# Patient Record
Sex: Female | Born: 1965 | Race: White | Hispanic: No | Smoking: Never smoker
Health system: Southern US, Community
[De-identification: ages and names within clinical notes are randomized; demographics above are authoritative.]

## PROBLEM LIST (undated history)

## (undated) DIAGNOSIS — F419 Anxiety disorder, unspecified: Secondary | ICD-10-CM

## (undated) DIAGNOSIS — Q519 Congenital malformation of uterus and cervix, unspecified: Secondary | ICD-10-CM

## (undated) DIAGNOSIS — R519 Headache, unspecified: Secondary | ICD-10-CM

## (undated) DIAGNOSIS — F32A Depression, unspecified: Secondary | ICD-10-CM

## (undated) DIAGNOSIS — M1990 Unspecified osteoarthritis, unspecified site: Secondary | ICD-10-CM

## (undated) DIAGNOSIS — G8929 Other chronic pain: Secondary | ICD-10-CM

## (undated) DIAGNOSIS — T8859XA Other complications of anesthesia, initial encounter: Secondary | ICD-10-CM

## (undated) DIAGNOSIS — N200 Calculus of kidney: Principal | ICD-10-CM

## (undated) DIAGNOSIS — M47816 Spondylosis without myelopathy or radiculopathy, lumbar region: Secondary | ICD-10-CM

## (undated) DIAGNOSIS — Z87898 Personal history of other specified conditions: Secondary | ICD-10-CM

## (undated) DIAGNOSIS — F4310 Post-traumatic stress disorder, unspecified: Secondary | ICD-10-CM

## (undated) DIAGNOSIS — T7840XA Allergy, unspecified, initial encounter: Secondary | ICD-10-CM

## (undated) DIAGNOSIS — M5481 Occipital neuralgia: Secondary | ICD-10-CM

## (undated) DIAGNOSIS — Z973 Presence of spectacles and contact lenses: Secondary | ICD-10-CM

## (undated) DIAGNOSIS — F329 Major depressive disorder, single episode, unspecified: Secondary | ICD-10-CM

## (undated) DIAGNOSIS — H9190 Unspecified hearing loss, unspecified ear: Secondary | ICD-10-CM

## (undated) DIAGNOSIS — K219 Gastro-esophageal reflux disease without esophagitis: Secondary | ICD-10-CM

## (undated) DIAGNOSIS — G4730 Sleep apnea, unspecified: Secondary | ICD-10-CM

## (undated) DIAGNOSIS — Z803 Family history of malignant neoplasm of breast: Secondary | ICD-10-CM

## (undated) DIAGNOSIS — F319 Bipolar disorder, unspecified: Secondary | ICD-10-CM

## (undated) DIAGNOSIS — M5136 Other intervertebral disc degeneration, lumbar region: Secondary | ICD-10-CM

## (undated) DIAGNOSIS — I10 Essential (primary) hypertension: Secondary | ICD-10-CM

## (undated) DIAGNOSIS — S82009A Unspecified fracture of unspecified patella, initial encounter for closed fracture: Secondary | ICD-10-CM

## (undated) DIAGNOSIS — R51 Headache: Secondary | ICD-10-CM

## (undated) DIAGNOSIS — M549 Dorsalgia, unspecified: Secondary | ICD-10-CM

## (undated) DIAGNOSIS — Z87442 Personal history of urinary calculi: Secondary | ICD-10-CM

## (undated) DIAGNOSIS — T4145XA Adverse effect of unspecified anesthetic, initial encounter: Secondary | ICD-10-CM

## (undated) DIAGNOSIS — M51369 Other intervertebral disc degeneration, lumbar region without mention of lumbar back pain or lower extremity pain: Secondary | ICD-10-CM

## (undated) HISTORY — PX: DILATION AND CURETTAGE OF UTERUS: SHX78

## (undated) HISTORY — PX: KNEE SURGERY: SHX244

## (undated) HISTORY — PX: LITHOTRIPSY: SUR834

## (undated) HISTORY — DX: Allergy, unspecified, initial encounter: T78.40XA

## (undated) HISTORY — PX: OVARY SURGERY: SHX727

## (undated) HISTORY — DX: Family history of malignant neoplasm of breast: Z80.3

## (undated) HISTORY — DX: Congenital malformation of uterus and cervix, unspecified: Q51.9

## (undated) HISTORY — PX: COLONOSCOPY W/ POLYPECTOMY: SHX1380

## (undated) HISTORY — PX: URETEROSCOPY WITH HOLMIUM LASER LITHOTRIPSY: SHX6645

## (undated) HISTORY — PX: CARPAL TUNNEL RELEASE: SHX101

## (undated) HISTORY — PX: NASAL SEPTUM SURGERY: SHX37

## (undated) HISTORY — DX: Calculus of kidney: N20.0

## (undated) HISTORY — PX: MIDDLE EAR SURGERY: SHX713

## (undated) HISTORY — PX: UPPER GI ENDOSCOPY: SHX6162

---
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
## 1997-09-22 ENCOUNTER — Encounter (HOSPITAL_COMMUNITY): Admission: RE | Admit: 1997-09-22 | Discharge: 1997-12-21 | Payer: Self-pay

## 2003-06-13 HISTORY — PX: GASTRIC BYPASS: SHX52

## 2004-05-29 ENCOUNTER — Ambulatory Visit: Payer: Self-pay

## 2004-06-12 HISTORY — PX: ABDOMINOPLASTY: SUR9

## 2006-06-12 HISTORY — PX: CHOLECYSTECTOMY: SHX55

## 2006-11-17 ENCOUNTER — Ambulatory Visit: Payer: Self-pay | Admitting: Psychiatry

## 2007-10-24 ENCOUNTER — Ambulatory Visit: Payer: Self-pay | Admitting: Unknown Physician Specialty

## 2007-11-07 ENCOUNTER — Ambulatory Visit: Payer: Self-pay | Admitting: Unknown Physician Specialty

## 2010-03-17 ENCOUNTER — Ambulatory Visit: Payer: Self-pay | Admitting: Unknown Physician Specialty

## 2010-04-27 ENCOUNTER — Ambulatory Visit: Payer: Self-pay | Admitting: Family Medicine

## 2011-03-10 ENCOUNTER — Ambulatory Visit: Payer: Self-pay | Admitting: Internal Medicine

## 2011-06-18 ENCOUNTER — Ambulatory Visit: Payer: Self-pay | Admitting: Internal Medicine

## 2012-08-04 ENCOUNTER — Emergency Department: Payer: Self-pay | Admitting: Emergency Medicine

## 2013-01-06 ENCOUNTER — Ambulatory Visit: Payer: Self-pay | Admitting: Emergency Medicine

## 2013-03-11 ENCOUNTER — Ambulatory Visit: Payer: Self-pay

## 2013-11-25 ENCOUNTER — Emergency Department: Payer: Self-pay | Admitting: Emergency Medicine

## 2013-11-25 LAB — CBC
HCT: 40.3 % (ref 35.0–47.0)
HGB: 13.3 g/dL (ref 12.0–16.0)
MCH: 29.7 pg (ref 26.0–34.0)
MCHC: 33.1 g/dL (ref 32.0–36.0)
MCV: 90 fL (ref 80–100)
Platelet: 242 10*3/uL (ref 150–440)
RBC: 4.49 10*6/uL (ref 3.80–5.20)
RDW: 13.9 % (ref 11.5–14.5)
WBC: 8.8 10*3/uL (ref 3.6–11.0)

## 2013-11-25 LAB — DRUG SCREEN, URINE
Amphetamines, Ur Screen: NEGATIVE (ref ?–1000)
BENZODIAZEPINE, UR SCRN: NEGATIVE (ref ?–200)
Barbiturates, Ur Screen: NEGATIVE (ref ?–200)
CANNABINOID 50 NG, UR ~~LOC~~: NEGATIVE (ref ?–50)
Cocaine Metabolite,Ur ~~LOC~~: NEGATIVE (ref ?–300)
MDMA (ECSTASY) UR SCREEN: NEGATIVE (ref ?–500)
Methadone, Ur Screen: NEGATIVE (ref ?–300)
Opiate, Ur Screen: POSITIVE (ref ?–300)
PHENCYCLIDINE (PCP) UR S: NEGATIVE (ref ?–25)
TRICYCLIC, UR SCREEN: NEGATIVE (ref ?–1000)

## 2013-11-25 LAB — ETHANOL
Ethanol %: <0.003 % (ref 0.000–0.080)
Ethanol: <3 mg/dL

## 2013-11-25 LAB — COMPREHENSIVE METABOLIC PANEL
ALBUMIN: 3.6 g/dL (ref 3.4–5.0)
ALK PHOS: 60 U/L
Anion Gap: 9 (ref 7–16)
BILIRUBIN TOTAL: 0.6 mg/dL (ref 0.2–1.0)
BUN: 14 mg/dL (ref 7–18)
CHLORIDE: 102 mmol/L (ref 98–107)
Calcium, Total: 8.5 mg/dL (ref 8.5–10.1)
Co2: 28 mmol/L (ref 21–32)
Creatinine: 0.75 mg/dL (ref 0.60–1.30)
EGFR (African American): >60
EGFR (Non-African Amer.): >60
Glucose: 128 mg/dL — ABNORMAL HIGH (ref 65–99)
Osmolality: 280 (ref 275–301)
POTASSIUM: 3.8 mmol/L (ref 3.5–5.1)
SGOT(AST): 27 U/L (ref 15–37)
SGPT (ALT): 20 U/L (ref 12–78)
SODIUM: 139 mmol/L (ref 136–145)
TOTAL PROTEIN: 6.9 g/dL (ref 6.4–8.2)

## 2013-11-25 LAB — URINALYSIS, COMPLETE
BACTERIA: NONE SEEN
BILIRUBIN, UR: NEGATIVE
Glucose,UR: NEGATIVE mg/dL (ref 0–75)
Ketone: NEGATIVE
LEUKOCYTE ESTERASE: NEGATIVE
NITRITE: NEGATIVE
Ph: 5 (ref 4.5–8.0)
RBC,UR: 496 /HPF (ref 0–5)
SPECIFIC GRAVITY: 1.026 (ref 1.003–1.030)
Squamous Epithelial: 1
WBC UR: 5 /HPF (ref 0–5)

## 2013-11-25 LAB — TSH: Thyroid Stimulating Horm: 0.27 u[IU]/mL — ABNORMAL LOW

## 2013-11-25 LAB — ACETAMINOPHEN LEVEL: Acetaminophen: <2 ug/mL

## 2013-11-25 LAB — CK: CK, Total: 318 U/L — ABNORMAL HIGH

## 2013-11-25 LAB — SALICYLATE LEVEL: Salicylates, Serum: <1.7 mg/dL

## 2014-01-07 ENCOUNTER — Ambulatory Visit: Payer: Self-pay | Admitting: Family Medicine

## 2014-03-20 ENCOUNTER — Emergency Department: Payer: Self-pay | Admitting: Emergency Medicine

## 2014-03-20 LAB — URINALYSIS, COMPLETE
BLOOD: NEGATIVE
Bacteria: NONE SEEN
Bilirubin,UR: NEGATIVE
GLUCOSE, UR: NEGATIVE mg/dL (ref 0–75)
Ketone: NEGATIVE
Leukocyte Esterase: NEGATIVE
Nitrite: NEGATIVE
Ph: 7 (ref 4.5–8.0)
Protein: NEGATIVE
RBC,UR: 2 /HPF (ref 0–5)
SPECIFIC GRAVITY: 1.008 (ref 1.003–1.030)
Squamous Epithelial: <1

## 2014-03-20 LAB — CBC
HCT: 38 % (ref 35.0–47.0)
HGB: 12.2 g/dL (ref 12.0–16.0)
MCH: 28.2 pg (ref 26.0–34.0)
MCHC: 32.1 g/dL (ref 32.0–36.0)
MCV: 88 fL (ref 80–100)
PLATELETS: 319 10*3/uL (ref 150–440)
RBC: 4.33 10*6/uL (ref 3.80–5.20)
RDW: 14.1 % (ref 11.5–14.5)
WBC: 3.9 10*3/uL (ref 3.6–11.0)

## 2014-03-20 LAB — COMPREHENSIVE METABOLIC PANEL
ANION GAP: 7 (ref 7–16)
Albumin: 3.3 g/dL — ABNORMAL LOW (ref 3.4–5.0)
Alkaline Phosphatase: 77 U/L
BILIRUBIN TOTAL: 0.5 mg/dL (ref 0.2–1.0)
BUN: 11 mg/dL (ref 7–18)
CHLORIDE: 106 mmol/L (ref 98–107)
CO2: 29 mmol/L (ref 21–32)
Calcium, Total: 8.2 mg/dL — ABNORMAL LOW (ref 8.5–10.1)
Creatinine: 0.84 mg/dL (ref 0.60–1.30)
Glucose: 96 mg/dL (ref 65–99)
OSMOLALITY: 282 (ref 275–301)
POTASSIUM: 4.1 mmol/L (ref 3.5–5.1)
SGOT(AST): 27 U/L (ref 15–37)
SGPT (ALT): 27 U/L
Sodium: 142 mmol/L (ref 136–145)
Total Protein: 6.8 g/dL (ref 6.4–8.2)

## 2014-04-09 ENCOUNTER — Encounter: Payer: Self-pay | Admitting: Family Medicine

## 2014-04-11 ENCOUNTER — Emergency Department: Payer: Self-pay | Admitting: Emergency Medicine

## 2014-04-11 LAB — CBC WITH DIFFERENTIAL/PLATELET
BASOS PCT: 0.5 %
Basophil #: 0 10*3/uL (ref 0.0–0.1)
EOS ABS: 0 10*3/uL (ref 0.0–0.7)
Eosinophil %: 0.5 %
HCT: 45.7 % (ref 35.0–47.0)
HGB: 15 g/dL (ref 12.0–16.0)
LYMPHS ABS: 1.1 10*3/uL (ref 1.0–3.6)
LYMPHS PCT: 12.1 %
MCH: 28.4 pg (ref 26.0–34.0)
MCHC: 32.9 g/dL (ref 32.0–36.0)
MCV: 86 fL (ref 80–100)
Monocyte #: 0.5 x10 3/mm (ref 0.2–0.9)
Monocyte %: 5.5 %
NEUTROS PCT: 81.4 %
Neutrophil #: 7.6 10*3/uL — ABNORMAL HIGH (ref 1.4–6.5)
Platelet: 346 10*3/uL (ref 150–440)
RBC: 5.29 10*6/uL — AB (ref 3.80–5.20)
RDW: 14.6 % — ABNORMAL HIGH (ref 11.5–14.5)
WBC: 9.4 10*3/uL (ref 3.6–11.0)

## 2014-04-11 LAB — URINALYSIS, COMPLETE
Bilirubin,UR: NEGATIVE
Glucose,UR: NEGATIVE mg/dL (ref 0–75)
KETONE: NEGATIVE
Leukocyte Esterase: NEGATIVE
NITRITE: NEGATIVE
Ph: 5 (ref 4.5–8.0)
Protein: 100
RBC,UR: 118 /HPF (ref 0–5)
SPECIFIC GRAVITY: 1.035 (ref 1.003–1.030)
WBC UR: 4 /HPF (ref 0–5)

## 2014-04-11 LAB — COMPREHENSIVE METABOLIC PANEL
ALT: 24 U/L
ANION GAP: 9 (ref 7–16)
AST: 30 U/L (ref 15–37)
Albumin: 3.9 g/dL (ref 3.4–5.0)
Alkaline Phosphatase: 69 U/L
BUN: 20 mg/dL — ABNORMAL HIGH (ref 7–18)
Bilirubin,Total: 0.8 mg/dL (ref 0.2–1.0)
CALCIUM: 8.2 mg/dL — AB (ref 8.5–10.1)
Chloride: 109 mmol/L — ABNORMAL HIGH (ref 98–107)
Co2: 24 mmol/L (ref 21–32)
Creatinine: 1.14 mg/dL (ref 0.60–1.30)
EGFR (African American): >60
EGFR (Non-African Amer.): 54 — ABNORMAL LOW
GLUCOSE: 106 mg/dL — AB (ref 65–99)
Osmolality: 286 (ref 275–301)
Potassium: 3.8 mmol/L (ref 3.5–5.1)
SODIUM: 142 mmol/L (ref 136–145)
Total Protein: 8 g/dL (ref 6.4–8.2)

## 2014-04-12 ENCOUNTER — Encounter: Payer: Self-pay | Admitting: Family Medicine

## 2014-04-22 ENCOUNTER — Ambulatory Visit: Payer: Self-pay

## 2014-05-04 ENCOUNTER — Inpatient Hospital Stay: Payer: Self-pay | Admitting: Psychiatry

## 2014-05-04 LAB — COMPREHENSIVE METABOLIC PANEL
ALT: 20 U/L
AST: 16 U/L (ref 15–37)
Albumin: 3.3 g/dL — ABNORMAL LOW (ref 3.4–5.0)
Alkaline Phosphatase: 63 U/L
Anion Gap: 8 (ref 7–16)
BUN: 14 mg/dL (ref 7–18)
Bilirubin,Total: 1.1 mg/dL — ABNORMAL HIGH (ref 0.2–1.0)
CO2: 26 mmol/L (ref 21–32)
CREATININE: 1.14 mg/dL (ref 0.60–1.30)
Calcium, Total: 8 mg/dL — ABNORMAL LOW (ref 8.5–10.1)
Chloride: 107 mmol/L (ref 98–107)
EGFR (African American): >60
EGFR (Non-African Amer.): 54 — ABNORMAL LOW
GLUCOSE: 112 mg/dL — AB (ref 65–99)
OSMOLALITY: 282 (ref 275–301)
Potassium: 3.4 mmol/L — ABNORMAL LOW (ref 3.5–5.1)
Sodium: 141 mmol/L (ref 136–145)
TOTAL PROTEIN: 7 g/dL (ref 6.4–8.2)

## 2014-05-04 LAB — CBC WITH DIFFERENTIAL/PLATELET
BASOS ABS: 0.1 10*3/uL (ref 0.0–0.1)
Basophil %: 0.6 %
Eosinophil #: 0.1 10*3/uL (ref 0.0–0.7)
Eosinophil %: 1.4 %
HCT: 39.5 % (ref 35.0–47.0)
HGB: 13.1 g/dL (ref 12.0–16.0)
Lymphocyte #: 1.6 10*3/uL (ref 1.0–3.6)
Lymphocyte %: 18.5 %
MCH: 28.6 pg (ref 26.0–34.0)
MCHC: 33.2 g/dL (ref 32.0–36.0)
MCV: 86 fL (ref 80–100)
MONO ABS: 0.6 x10 3/mm (ref 0.2–0.9)
Monocyte %: 6.8 %
Neutrophil #: 6.1 10*3/uL (ref 1.4–6.5)
Neutrophil %: 72.7 %
Platelet: 241 10*3/uL (ref 150–440)
RBC: 4.58 10*6/uL (ref 3.80–5.20)
RDW: 14.3 % (ref 11.5–14.5)
WBC: 8.4 10*3/uL (ref 3.6–11.0)

## 2014-05-04 LAB — DRUG SCREEN, URINE

## 2014-05-04 LAB — SALICYLATE LEVEL: Salicylates, Serum: <1.7 mg/dL

## 2014-05-04 LAB — ACETAMINOPHEN LEVEL

## 2014-05-04 LAB — TROPONIN I: Troponin-I: <0.02 ng/mL

## 2014-05-07 LAB — URINALYSIS, COMPLETE
Bilirubin,UR: NEGATIVE
Blood: NEGATIVE
Glucose,UR: NEGATIVE mg/dL (ref 0–75)
KETONE: NEGATIVE
LEUKOCYTE ESTERASE: NEGATIVE
Nitrite: NEGATIVE
PH: 5 (ref 4.5–8.0)
Protein: 100
Specific Gravity: 1.02 (ref 1.003–1.030)
WBC UR: 3 /HPF (ref 0–5)

## 2014-05-07 LAB — COMPREHENSIVE METABOLIC PANEL
ALT: 20 U/L
ANION GAP: 12 (ref 7–16)
AST: 25 U/L (ref 15–37)
Albumin: 3.7 g/dL (ref 3.4–5.0)
Alkaline Phosphatase: 70 U/L
BUN: 14 mg/dL (ref 7–18)
Bilirubin,Total: 0.9 mg/dL (ref 0.2–1.0)
CHLORIDE: 108 mmol/L — AB (ref 98–107)
CO2: 20 mmol/L — AB (ref 21–32)
CREATININE: 1.24 mg/dL (ref 0.60–1.30)
Calcium, Total: 8.6 mg/dL (ref 8.5–10.1)
EGFR (African American): 59 — ABNORMAL LOW
EGFR (Non-African Amer.): 49 — ABNORMAL LOW
Glucose: 155 mg/dL — ABNORMAL HIGH (ref 65–99)
Osmolality: 283 (ref 275–301)
Potassium: 3.5 mmol/L (ref 3.5–5.1)
Sodium: 140 mmol/L (ref 136–145)
Total Protein: 7.8 g/dL (ref 6.4–8.2)

## 2014-05-07 LAB — DRUG SCREEN, URINE
Amphetamines, Ur Screen: NEGATIVE (ref ?–1000)
BARBITURATES, UR SCREEN: NEGATIVE (ref ?–200)
BENZODIAZEPINE, UR SCRN: NEGATIVE (ref ?–200)
COCAINE METABOLITE, UR ~~LOC~~: NEGATIVE (ref ?–300)
Cannabinoid 50 Ng, Ur ~~LOC~~: NEGATIVE (ref ?–50)
MDMA (ECSTASY) UR SCREEN: NEGATIVE (ref ?–500)
Methadone, Ur Screen: NEGATIVE (ref ?–300)
Opiate, Ur Screen: NEGATIVE (ref ?–300)
Phencyclidine (PCP) Ur S: NEGATIVE (ref ?–25)
TRICYCLIC, UR SCREEN: NEGATIVE (ref ?–1000)

## 2014-05-07 LAB — SALICYLATE LEVEL

## 2014-05-07 LAB — CBC
HCT: 44.6 % (ref 35.0–47.0)
HGB: 14.5 g/dL (ref 12.0–16.0)
MCH: 28.3 pg (ref 26.0–34.0)
MCHC: 32.5 g/dL (ref 32.0–36.0)
MCV: 87 fL (ref 80–100)
Platelet: 286 10*3/uL (ref 150–440)
RBC: 5.12 10*6/uL (ref 3.80–5.20)
RDW: 14.6 % — AB (ref 11.5–14.5)
WBC: 9.8 10*3/uL (ref 3.6–11.0)

## 2014-05-07 LAB — ETHANOL: Ethanol: <3 mg/dL

## 2014-05-07 LAB — TSH: Thyroid Stimulating Horm: 1.03 u[IU]/mL

## 2014-05-07 LAB — ACETAMINOPHEN LEVEL: Acetaminophen: <2 ug/mL

## 2014-05-08 ENCOUNTER — Inpatient Hospital Stay: Payer: Self-pay | Admitting: Psychiatry

## 2014-05-19 ENCOUNTER — Emergency Department: Payer: Self-pay | Admitting: Internal Medicine

## 2014-05-19 LAB — CBC
HCT: 39 % (ref 35.0–47.0)
HGB: 12.7 g/dL (ref 12.0–16.0)
MCH: 28.4 pg (ref 26.0–34.0)
MCHC: 32.5 g/dL (ref 32.0–36.0)
MCV: 88 fL (ref 80–100)
Platelet: 242 10*3/uL (ref 150–440)
RBC: 4.46 10*6/uL (ref 3.80–5.20)
RDW: 14.9 % — ABNORMAL HIGH (ref 11.5–14.5)
WBC: 7.1 10*3/uL (ref 3.6–11.0)

## 2014-05-19 LAB — URINALYSIS, COMPLETE
Bilirubin,UR: NEGATIVE
GLUCOSE, UR: NEGATIVE mg/dL (ref 0–75)
Hyaline Cast: 2
Ketone: NEGATIVE
LEUKOCYTE ESTERASE: NEGATIVE
NITRITE: NEGATIVE
Ph: 5 (ref 4.5–8.0)
Protein: NEGATIVE
RBC,UR: 2 /HPF (ref 0–5)
Specific Gravity: 1.021 (ref 1.003–1.030)
Squamous Epithelial: 1
WBC UR: 2 /HPF (ref 0–5)

## 2014-05-19 LAB — DRUG SCREEN, URINE
AMPHETAMINES, UR SCREEN: NEGATIVE (ref ?–1000)
Barbiturates, Ur Screen: NEGATIVE (ref ?–200)
Benzodiazepine, Ur Scrn: NEGATIVE (ref ?–200)
Cannabinoid 50 Ng, Ur ~~LOC~~: NEGATIVE (ref ?–50)
Cocaine Metabolite,Ur ~~LOC~~: NEGATIVE (ref ?–300)
MDMA (ECSTASY) UR SCREEN: NEGATIVE (ref ?–500)
Methadone, Ur Screen: NEGATIVE (ref ?–300)
Opiate, Ur Screen: POSITIVE (ref ?–300)
Phencyclidine (PCP) Ur S: NEGATIVE (ref ?–25)
Tricyclic, Ur Screen: NEGATIVE (ref ?–1000)

## 2014-05-19 LAB — COMPREHENSIVE METABOLIC PANEL
ALBUMIN: 3.4 g/dL (ref 3.4–5.0)
AST: 47 U/L — AB (ref 15–37)
Alkaline Phosphatase: 79 U/L
Anion Gap: 8 (ref 7–16)
BUN: 14 mg/dL (ref 7–18)
Bilirubin,Total: 0.4 mg/dL (ref 0.2–1.0)
CO2: 30 mmol/L (ref 21–32)
Calcium, Total: 8.5 mg/dL (ref 8.5–10.1)
Chloride: 100 mmol/L (ref 98–107)
Creatinine: 1.04 mg/dL (ref 0.60–1.30)
EGFR (African American): >60
EGFR (Non-African Amer.): 60 — ABNORMAL LOW
Glucose: 103 mg/dL — ABNORMAL HIGH (ref 65–99)
Osmolality: 276 (ref 275–301)
POTASSIUM: 4.1 mmol/L (ref 3.5–5.1)
SGPT (ALT): 28 U/L
SODIUM: 138 mmol/L (ref 136–145)
TOTAL PROTEIN: 6.9 g/dL (ref 6.4–8.2)

## 2014-05-19 LAB — CK TOTAL AND CKMB (NOT AT ARMC)
CK, Total: 706 U/L — ABNORMAL HIGH (ref 26–192)
CK-MB: 8.6 ng/mL — ABNORMAL HIGH (ref 0.5–3.6)

## 2014-05-19 LAB — ETHANOL: Ethanol: <3 mg/dL

## 2014-05-19 LAB — TROPONIN I: Troponin-I: <0.02 ng/mL

## 2014-08-20 ENCOUNTER — Emergency Department: Payer: Self-pay | Admitting: Emergency Medicine

## 2014-08-30 ENCOUNTER — Emergency Department: Payer: Self-pay | Admitting: Internal Medicine

## 2014-09-10 ENCOUNTER — Ambulatory Visit
Admit: 2014-09-10 | Disposition: A | Discharge status: Home / Self Care | Payer: Self-pay | Attending: Unknown Physician Specialty | Admitting: Unknown Physician Specialty

## 2014-09-18 ENCOUNTER — Emergency Department
Admit: 2014-09-18 | Disposition: A | Discharge status: Home / Self Care | Payer: Self-pay | Admitting: Emergency Medicine

## 2014-10-03 NOTE — Consult Note (Signed)
PATIENT NAME:  Mariah Reeves, Oral A MR#:  [id number] DATE OF BIRTH:  04/04/1966  DATE OF CONSULTATION:  11/25/2013  CONSULTING PHYSICIAN:  Audery AmelJohn T. Eunie Lawn, MD  IDENTIFYING INFORMATION AND REASON FOR CONSULTATION: A 49 year old woman who presented to the Emergency Room with complaints of "shaking." Consultation out of concern that her psychiatric medicines may be playing a role.   HISTORY OF PRESENT ILLNESS: Information obtained from the patient and the chart and from the Emergency Room doctor. The patient states that she woke up last night feeling like she was shaking all over out of control. It persisted into the morning, and she came into the hospital. The patient theorizes that this could be due to Lexapro, which she says she has only been taking for a couple months. She says when she first started taking that medicine, it caused a similar feeling, but the feeling went away, so maybe it could be causing it now. The patient is unclear about some of the doses of her medicines and did not bring all of her bottles into the hospital. Her recent mood has continued to be anxious, but she has not had any acute or recent suicidal thoughts. She describes today what might be mild visual illusions, possibly related to sedation. Does not normally have hallucinations. Does not describe normal psychotic symptoms. The major stress in her life is having lost her job recently and having chronic pain problems. She is seeing a Dr. Lennice SitesHelton as a psychiatrist in [place] and sees Dr. Quillian QuinceBliss as her primary care doctor.   PAST PSYCHIATRIC HISTORY: Has been treated for anxiety and depression symptoms. In the past, was diagnosed as bipolar disorder. Current doctor says that he thinks that she has depression rather than bipolar disorder. She has not had any inpatient hospitalizations or suicide attempts. Had a crisis visit in [place] several months ago for only a few hours, but was not admitted.   SUBSTANCE ABUSE HISTORY: Denies any current  substance abuse.   SOCIAL HISTORY: The patient lives with her husband. 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 This was evidently a major stress for her, and she discusses it in relationship to her anxiety and depression symptoms.   CURRENT MEDICATIONS: Lamictal 200 mg x2 at bedtime, Lexapro 10 mg p.o. at bedtime, quetiapine 12.5 mg at bedtime p.r.n. sleep, gabapentin 600 mg p.o. at bedtime, Naprosyn 500 mg twice a day p.r.n., Phenergan 25 mg p.o. q.6 hours p.r.n., OxyContin 20 mg p.o. b.i.d., hydrocodone/acetaminophen 10 mg strength 1 q. hours p.r.n. for pain.   ALLERGIES: IV DYE.   REVIEW OF SYSTEMS: Still feels like she is shaking all over.   MENTAL STATUS EXAMINATION: Mood is a little bit down and anxious. Denied suicidal ideation. Not having acute hallucinations. Alert and oriented x4. Short- and long-term memory show a little bit of impairment right now. She overall looks tired, makes poor eye contact. Speech is slow and halting.   LABORATORY RESULTS: Labs done in the Emergency Room include drug screen positive for opiates. CK elevated at 318. TSH low at 0.27. Negative alcohol. Chemistry: Just a slightly elevated glucose of 128. CBC normal. Urinalysis: 3+ blood.   ASSESSMENT: This is a 49 year old woman who presents with somatic symptoms of unclear etiology. She speculates it could be related to her psychiatric medicine. She claims to me that she is not taking the quetiapine at all, although apparently she told Dr. Mayford KnifeWilliams that she had taken it last night. It seems  unclear whether there has been a recent increase in her Lexapro. I was given 2 different doses, one by her doctor and one by the pharmacy. Her current symptoms would be somewhat atypical, but could be related to her psychiatric medicines. She does not appear to be acutely dangerous or require psychiatric hospitalization. The patient, on physical exam, showed no stiffness at all or  rigidity. She is currently tachycardic and her blood pressure is elevated. Her temperature was only mildly elevated earlier, however. Her CK is up a little bit, which is, of course, nonspecific. What she is describing could be serotonin syndrome. For this reason, I suggest that she cut back on her dose of the Lexapro, but I suggest she not stop it completely out of concern that she may have withdrawal symptoms from it. Again, I encouraged her to follow up with her primary psychiatrist as soon as possible.   TREATMENT PLAN: I suggest to her that she not use any of the quetiapine, that she cut her Lexapro dose, whatever it is, in half, and that she call her psychiatrist at her earliest opportunity to discuss medication management. I also advised her to take a minimal amount of the p.r.n. hydrocodone as needed since apparently the plan is to transition her to OxyContin and to follow up with Dr. Quillian Quince for that. Case discussed with Dr. Mayford Knife, who is agreeable to the plan.   VITAL SIGNS: Pulse 134, respirations 20, blood pressure 150/67.   DIAGNOSIS, PRINCIPAL AND PRIMARY:  AXIS I: Depression, not otherwise specified.   SECONDARY DIAGNOSES:  AXIS I: No further.  AXIS II: Deferred.  AXIS III: Chronic pain and acute pain.  AXIS IV: Moderate to severe from job loss.  AXIS V: Functioning at time of evaluation 55.    ____________________________ Audery Amel, MD jtc:lb D: 11/25/2013 12:03:33 ET T: 11/25/2013 12:20:43 ET JOB#: [id number]  cc: Audery Amel, MD, <Dictator> Audery Amel MD ELECTRONICALLY SIGNED 11/26/2013 13:24

## 2014-10-03 NOTE — H&P (Signed)
PATIENT NAME:  Mariah Reeves, GEERDES MR#:  [id number] DATE OF BIRTH:  Sep 09, 1965  DATE OF ADMISSION:  05/04/2014  REFERRING PHYSICIAN: Emergency Room MD.   ATTENDING PHYSICIAN: Kenlea Woodell B. Jennet Maduro, MD.   IDENTIFYING DATA: Miss Mariah Reeves is a 49 year old female with history of bipolar disorder.   CHIEF COMPLAINT: "It was a mistake."   HISTORY OF PRESENT ILLNESS:  Miss Mariah Reeves has been diagnosed with bipolar disorder many years ago, for 12 years she had been a patient of Dr. Doroteo Glassman who prescribed Lamictal, Neurontin, and Lexapro. A while ago she decided to switch psychiatrists and now sees Dr. Lennice Sites in [place].  This was at the time when she was employed by [employer] and wanted to have a psychiatrist closer. She has been happy working with Dr. Lennice Sites as well as a therapist. She has been maintained on the same regimen of Lamictal and Neurontin and Lexapro. She has been under considerable stress for the past year or so. 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 Also she and her husband separated in October after 7 years of marriage, apparently lately the husband has been indicating that he started therapy and maybe they could get back together at some point. She overdosed on Lamictal on Sunday. She denies that it was a suicide attempt, instead she explains that her husband was supposed to call her on Sunday or maybe Saturday as Sunday was their wedding anniversary and he promised to take her out to dinner. Since he did not call she did not want to get out of bed and took extra Lamictal during the day twice keep herself sleepy. He did eventually call at 7:00 and reportedly they had dinner. After dinner she got sick, she felt that it was from Lamictal, called 911 and was brought to the hospital. She was really sleepy and  somewhat confused and it took her several hours to recover. She ended up being admitted to psychiatry. She adamantly denies any symptoms of depression, anxiety, psychosis, or symptoms suggestive of bipolar mania. She denies alcohol or illicit substance use. She does have prescription for Vicodin from her primary care provider with whom she has pain contract, she adamantly denies ever misusing her pain medication and is very careful not to ever run out since she needs it badly.   PAST PSYCHIATRIC HISTORY: She has a long history of bipolar illness. She has been treatment compliant. She never attempted suicide, has never been hospitalized.   PAST MEDICAL HISTORY:  Chronic pain.   ALLERGIES: INTRAVENOUS PYELOGRAM DYE.    MEDICATIONS ON ADMISSION: Lamictal 400 mg at night, Lexapro 20 mg daily, Neurontin 600 mg at night, Percocet for pain, Flomax occasionally for nasal congestion.   SOCIAL HISTORY: She is now separated from her husband. She is originally from [place] and still has [place] citizenship. 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 She explains that following a knee surgery it was recommended that as often as possible during the day she takes a break and ices her knee, she reportedly during her break during teaching hours had a mat on the floor and was icing her knee when the student took a picture of her proving that she was asleep, she was  fired immediately. There are some outstanding charges for DUI. She now lives alone at the trailer that she owns as she is separated from her husband. She is on short-term disability and receives an income from it. She gets some financial support from her husband.   FAMILY PSYCHIATRIC HISTORY: None reported.   REVIEW OF SYSTEMS:  CONSTITUTIONAL: No fevers.  No chills. No weight changes.  EYES: No double or blurred vision.  EARS, NOSE, AND THROAT: No hearing loss.  RESPIRATORY: No shortness of breath or  cough.  CARDIOVASCULAR: No chest pain or orthopnea.  GASTROINTESTINAL: No abdominal pain, nausea, vomiting, or diarrhea.  GENITOURINARY: No incontinence or frequency.  ENDOCRINE: No heat or cold intolerance.  LYMPHATIC: No anemia or easy bruising.  INTEGUMENTARY: No acne or rash.  MUSCULOSKELETAL: No muscle or joint pain. Positive for back pain.  NEUROLOGIC: No tingling or weakness.  PSYCHIATRIC: See history of present illness for details.   PHYSICAL EXAMINATION:  VITAL SIGNS: Blood pressure 124/83, pulse 80, respirations 20, temperature 98.6. GENERAL: This is slightly obese, middle-aged female in no acute distress.  HEENT: The pupils are equal, round, and reactive to light. Sclerae anicteric.  NECK: Supple. No thyromegaly.  LUNGS: Clear to auscultation. No dullness to percussion.  HEART: Regular rhythm and rate. No murmurs, rubs, or gallops.  ABDOMEN: Soft, nontender, nondistended. Positive bowel sounds.  MUSCULOSKELETAL: Normal muscle strength in all extremities.  SKIN: No rashes or bruises.  LYMPHATIC: No cervical adenopathy.  NEUROLOGIC: Cranial nerves II through XII are intact.   LABORATORY DATA: Chemistries are within normal limits except for blood glucose of 112, potassium 3.4. Blood alcohol level not tested. LFTs within normal limits except for total bilirubin of 1.1. Urine toxicology screen negative for substances. CBC within normal limits. Serum acetaminophen and salicylates are low. EKG, sinus rhythm with fusion complexes.     MENTAL STATUS EXAMINATION:  The patient is alert and oriented to person, place, time, and situation. She is pleasant, polite and cooperative. She is well groomed and casually dressed. She maintains good eye contact. Her speech is of normal rhythm, rate and volume. Mood is fine with full affect. Thought process is logical and goal oriented. Thought content, she denies suicidal or homicidal ideation, delusions, or paranoia. There are no auditory or visual  hallucinations. Her cognition is grossly intact. Registration, recall, short and long-term memory are intact. She is of above average intelligence and fund of knowledge. Her insight and judgment are fair.   SUICIDE RISK ASSESSMENT: This is a patient with a long history of bipolar illness stable on medication, who took extra 2 doses of Lamictal. She denies that it was a suicide attempt. She is able to contract for safety. She hopes to reconcile her differences with her ex. She is forward thinking and optimistic about the future.   DIAGNOSES:  AXIS I: Bipolar 2 disorder.   AXIS II: Deferred.   AXIS III:  1.  Chronic back pain.   2.  Kidney stones.  3.  Status post gastric bypass surgery.   PLAN: The patient was briefly admitted to [facility] behavioral medicine unit for safety, stabilization, and medication management.     1.  Suicidal ideation. She denies and is able to contract for safety.  2.  Mood. She will continue Lamictal, Neurontin, and Lexapro as per primary psychiatrist.   3.  Chronic pain.  She will continue with her pain doctor  4.  Social. She has a followup appointment with her nephrologist tomorrow,  she wants to keep this appointment, that is in [place].  She hopes that the doctor will give her permission to exercise, she joined a gym and wants to start on Monday. She gained 30 pounds lately and wants to get in shape. She plans on spending Thanksgiving with her mother.   DISPOSITION: She was discharged to home. She will follow up with Dr. Lennice SitesHelton and her therapist in the community.  DISCHARGE MEDICATIONS:  Lamictal 400 mg at bedtime, Neurontin 600 mg at bedtime, Lexapro 20 mg daily, Percocet as prescribed by her primary care provider. No prescriptions were given.    ____________________________ Ellin GoodieJolanta B. Jennet MaduroPucilowska, MD jbp:bu D: 05/05/2014 15:18:32 ET T: 05/05/2014 15:58:08 ET JOB#: [id number]  cc: Camarion Weier B. Jennet MaduroPucilowska, MD, <Dictator> Shari ProwsJOLANTA B  Less Woolsey MD ELECTRONICALLY SIGNED 05/14/2014 17:24

## 2014-10-03 NOTE — H&P (Signed)
PATIENT NAME:  Mariah Reeves, Baneza A MR#:  [id number] DATE OF BIRTH:  06/30/1965  DATE OF ADMISSION:  05/08/2014  DATE OF CONSULTATION: 05/08/2014.    CONSULTING PHYSICIAN: Audery AmelJohn T. Clapacs, M.D.   IDENTIFYING INFORMATION AND CHIEF COMPLAINT:  A 49 year old woman who came to the hospital after taking an overdose of prescription medicine.   CHIEF COMPLAINT: "I feel stupid."   HISTORY OF PRESENT ILLNESS: Information obtained from the patient and the chart. The patient says that after leaving the hospital 2 days ago, she continued to feel very depressed and hopeless.  Had another conversation with her husband, who made it clear to her that he wanted to get a divorce.  She reports that she took an overdose of her Lamictal, approximately 15 pills. She was going to take more, but she started vomiting.  At that point, she called 911 herself.  She reports that her mood continues to be depressed and feels hopeless and sad and down with low energy.  She denies having suicidal ideation currently right now, says that she thinks that she did want to die when she took the pills.  She has otherwise been compliant with previously prescribed medication and had not been abusing substances.   PAST HISTORY:  History of depression that has been going on at least ever since she and her husband separated, probably longer than that.  She sees Dr. Lennice SitesHelton in [place].  She is on a combination of Lexapro and Lamictal and Neurontin. Major stress includes separation from her husband as well as loss of her job. The patient has made suicide attempts in the past. Had a recent hospitalization here.   FAMILY HISTORY: She has no known family history of mental illness.   PAST MEDICAL HISTORY: Chronic pain problems, also chronic nausea. Recent kidney stones that were treated with lithotripsy.   SOCIAL HISTORY: Currently living alone. 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 Separated from her husband. All of that appears catastrophic for her.  She has limited social contact otherwise.   SUBSTANCE ABUSE HISTORY: Denies any history of alcohol or drug abuse.   CURRENT MEDICATIONS: Lamictal 400 mg at night, Lexapro 20 mg a day, gabapentin 600 mg at night p.r.n., Prozac 0.4 mg, Flomax a day.   ALLERGIES: IVP DYE.   REVIEW OF SYSTEMS: Currently states that she feels depressed and tired. Denies acute suicidal intent. Denies hallucinations. No acute pain. Otherwise, denies other symptoms.   MENTAL STATUS EXAMINATION: Tired and sluggish-appearing woman, looks her stated age, cooperative but passive.  Eye contact decreased.  Psychomotor activity slow. Speech slow, but understandable. Affect blunted. Mood stated as being stupid. Thoughts are lucid without loosening of associations or delusions, but show thoughts slowing and some blocking.  Denies auditory or visual hallucinations.  Denies suicidal intent, still does have some hopelessness, no homicidal ideation.  She could recall 3/3 objects immediately and 2/3 at 3 minutes. She was alert and oriented x 4. Judgment and insight adequate at the moment.  Intelligence normal.   LABORATORY RESULTS: Urinalysis 3 white blood cells, trace bacteria. Drug screen is all negative. TSH normal. Salicylates normal. CBC normal. Alcohol negative. Chemistry slightly elevated glucose 155.   VITAL SIGNS: Current blood pressure is 143/94, respirations 16, pulse 86, temperature 98.3.   ASSESSMENT: A 49 year old woman with major depression, severe, who was just released from the hospital went home and once again tried to kill herself.  This time she is being very up front about it.  Multiple  symptoms of severe depression. Functioning very poorly at home, impulsive and suicidal even without substance abuse. Needs hospitalization for safety.   TREATMENT PLAN: Admit to psychiatry. Continue usual outpatient medicine. Seizure and fall precautions in place, as well as suicide precautions.   DIAGNOSIS, PRINCIPAL AND PRIMARY:   AXIS I: Major depression, severe, recurrent.   SECONDARY DIAGNOSES:  AXIS I: Deferred.  AXIS II: Deferred.  AXIS III: Kidney stones, chronic pain.     ____________________________ Audery Amel, MD jtc:DT D: 05/08/2014 12:49:04 ET T: 05/08/2014 13:18:44 ET JOB#: [id number]  cc: Audery Amel, MD, <Dictator> Audery Amel MD ELECTRONICALLY SIGNED 05/22/2014 19:09

## 2014-10-03 NOTE — Consult Note (Signed)
PATIENT NAME:  Mariah Reeves, Mariah Reeves MR#:  [id number] DATE OF BIRTH:  10/08/1965  DATE OF CONSULTATION:  05/04/2014  REFERRING PHYSICIAN:   CONSULTING PHYSICIAN:  Audery Amel, MD  IDENTIFYING INFORMATION AND REASON FOR CONSULTATION: A 49 year old woman who came into the Emergency Room voluntarily because of dizziness.   CHIEF COMPLAINT: "I'm not suicidal."   HISTORY OF PRESENT ILLNESS: Information obtained from the patient and the chart. The patient states that yesterday she took extra doses of Lamictal. In the morning she wanted to go back to sleep at 6:00 and so she took an extra 400 mg of Lamictal. Later on in the day, she wanted to take a nap so she took another 400 mg of Lamictal. She started feeling sick to her stomach, feeling dizzy, falling down, unable to walk steadily. She called 911. The patient reports her mood has been extremely depressed recently. She has very little enjoyment of activities in her life. She spends most of her time sitting around the house doing nothing. She sleeps excessively. She says she has had passive suicidal thoughts, but has no active suicidal intent or plan. Not reporting any hallucinations. She is currently seeing Dr. Lennice Sites in [facility] and also sees a Veterinary surgeon. She is taking Lamictal 400 mg at night and Lexapro 20 mg a day and gabapentin 600 mg at night. Similar medicines to what she was taking several months ago when I saw her in the Emergency Room. The patient stays nervous and anxious all the time, feels hopeless about her life.   PAST PSYCHIATRIC HISTORY: The patient has had psychiatric treatment for years. She had a past history of suicidal thinking, but has not made serious suicide attempts. No previous hospitalization. Cannot remember previous medicines that she has been treated with. She has been variously diagnosed as bipolar or regular depression.   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 She is also  separated from her husband. She and her husband separated a couple of months ago and this has been a huge stress for her, which she cannot except.   FAMILY HISTORY: No known family history.   PAST MEDICAL HISTORY: Chronic pain problems.   SUBSTANCE ABUSE HISTORY: Denies use of alcohol or illicit drugs. Denies that she abuses any of her prescription drugs.   CURRENT MEDICATIONS: Lamictal 400 mg at night, Lexapro 20 mg a day, gabapentin 600 mg at night, p.r.n. Percocet for pain, Flomax occasionally for nasal congestion.   ALLERGIES: INTRAVENOUS PYELOGRAM DYE.   REVIEW OF SYSTEMS: Depressed mood. Tearfulness. Low energy. Excessive sleepiness. Lack of interest. Hopelessness. Still feeling a little sick to her stomach, still feeling a little bit dizzy. Otherwise negative physical review of systems.   MENTAL STATUS EXAMINATION: Reasonably groomed woman, looks her stated age, cooperative with the interview. Eye contact good. Psychomotor activity very slow. Speech is quiet, slow and decreased in amount. Affect is dysphoric, not tearful. Mood stated as depressed. Thoughts are lucid but slow. No delusions evident. Denies auditory or visual hallucinations. Denies any current suicidal or homicidal ideation. She can recall 3/3 objects immediately and at 3 minutes. She is alert and oriented x 4. Judgment and insight at least partially intact. Normal intelligence.   LABORATORY RESULTS: Chemistry panel shows drug screen is all negative, slightly low albumin at 3.3. Elevated bilirubin 1.1, low calcium at 8. CBC is unremarkable.   VITAL SIGNS: Current blood pressure is 134/79, respirations 18, pulse 90, temperature 98.  ASSESSMENT: A 49 year old woman with severe major depression. Claims that what she did yesterday was not a suicide attempt, but certainly was in poor judgment. Very sleepy and depressed looking. Not functioning well at home. I think that her lack of support, lack of response to outpatient  treatment and this dangerous behavior warrants inpatient hospitalization.   TREATMENT PLAN: The patient agrees to voluntarily be admitted to the hospital. Suicide precautions as well as fall precautions in place. No change currently to acute medicines, which primary team on the unit can do. Psychoeducation.   DIAGNOSIS, PRINCIPAL AND PRIMARY:  AXIS I: Major depression, severe, recurrent.   SECONDARY DIAGNOSES:  AXIS I: Rule out bipolar disorder.  AXIS II: Deferred.  AXIS III: Chronic pain.   ____________________________ Audery AmelJohn T. Keontre Defino, MD jtc:TT D: 05/04/2014 15:00:36 ET T: 05/04/2014 15:16:22 ET JOB#: [id number]  cc: Audery AmelJohn T. Taniah Reinecke, MD, <Dictator> Audery AmelJOHN T Tadan Shill MD ELECTRONICALLY SIGNED 05/22/2014 19:08

## 2014-10-05 LAB — SURGICAL PATHOLOGY

## 2014-11-01 ENCOUNTER — Encounter: Payer: Self-pay | Admitting: Emergency Medicine

## 2014-11-01 ENCOUNTER — Emergency Department: Payer: BC Managed Care – PPO

## 2014-11-01 ENCOUNTER — Emergency Department
Admission: EM | Admit: 2014-11-01 | Discharge: 2014-11-01 | Disposition: A | Discharge status: Home / Self Care | Payer: BC Managed Care – PPO | Attending: Emergency Medicine | Admitting: Emergency Medicine

## 2014-11-01 DIAGNOSIS — S0990XA Unspecified injury of head, initial encounter: Secondary | ICD-10-CM | POA: Diagnosis present

## 2014-11-01 DIAGNOSIS — S0093XA Contusion of unspecified part of head, initial encounter: Secondary | ICD-10-CM | POA: Diagnosis not present

## 2014-11-01 DIAGNOSIS — S8001XA Contusion of right knee, initial encounter: Secondary | ICD-10-CM | POA: Insufficient documentation

## 2014-11-01 DIAGNOSIS — S161XXA Strain of muscle, fascia and tendon at neck level, initial encounter: Secondary | ICD-10-CM | POA: Insufficient documentation

## 2014-11-01 DIAGNOSIS — Y998 Other external cause status: Secondary | ICD-10-CM | POA: Insufficient documentation

## 2014-11-01 DIAGNOSIS — Y9289 Other specified places as the place of occurrence of the external cause: Secondary | ICD-10-CM | POA: Diagnosis not present

## 2014-11-01 DIAGNOSIS — W01198A Fall on same level from slipping, tripping and stumbling with subsequent striking against other object, initial encounter: Secondary | ICD-10-CM | POA: Diagnosis not present

## 2014-11-01 DIAGNOSIS — Y9389 Activity, other specified: Secondary | ICD-10-CM | POA: Diagnosis not present

## 2014-11-01 HISTORY — DX: Bipolar disorder, unspecified: F31.9

## 2014-11-01 HISTORY — DX: Major depressive disorder, single episode, unspecified: F32.9

## 2014-11-01 HISTORY — DX: Depression, unspecified: F32.A

## 2014-11-01 NOTE — ED Notes (Signed)
Pt presents from home via [facility]. EMS states pt fell in the kitchen and hit her head on the countertop, main complaint is R knee pain. Pain 7/10 in R knee, described as throbbing. EMS states pt took Gabapentin, Phengergan, Lamictal, pt presents lethargic.

## 2014-11-01 NOTE — ED Provider Notes (Signed)
[facility]  ____________________________________________  Time seen: 10:15  I have reviewed the triage vital signs and the nursing notes.   HISTORY  Chief Complaint Fall and Knee Pain   HPI Mariah Reeves is a 49 y.o. female presents to the emergency room today via EMS with complaint of falling in the kitchen and hitting her head on the countertop. She lives alone so it was an unwitnessed fall, she denies any loss of consciousness. She is the one who called EMS to pick her up. She complains of a headache that she is afraid he is going to turn into "migraine" on him and right knee pain. Her pain is 7 out of 10 at present. She is only taking her regular medication of gabapentin, Phenergan, Lamictal. EMS reports that she was ambulatory at the scene. She states that she has had arthroscopic surgery on her right knee in the past but is unable to give an exact date.   Past Medical History  Diagnosis Date  . Depression   . Bipolar 1 disorder     There are no active problems to display for this patient.   Past Surgical History  Procedure Laterality Date  . Gastric bypass    . Knee surgery      No current outpatient prescriptions on file.  Allergies Contrast media  History reviewed. No pertinent family history.  Social History History  Substance Use Topics  . Smoking status: Never Smoker   . Smokeless tobacco: Never Used  . Alcohol Use: No    Review of Systems Constitutional: No fever/chills Eyes: No visual changes. ENT: No sore throat. Cardiovascular: Denies chest pain. Respiratory: Denies shortness of breath. Gastrointestinal: No abdominal pain.  No nausea, no vomiting.  No diarrhea.  No constipation. Genitourinary: Negative for dysuria. Musculoskeletal: Negative for back pain. Positive for right knee pain. Skin: Negative for rash. Neurological: Negative for headaches, focal weakness or numbness.  10-point ROS  otherwise negative.  ____________________________________________   PHYSICAL EXAM:  VITAL SIGNS: ED Triage Vitals  Enc Vitals Group     BP 11/01/14 1015 143/94 mmHg     Pulse Rate 11/01/14 1015 94     Resp 11/01/14 1015 16     Temp 11/01/14 1015 98 F (36.7 C)     Temp Source 11/01/14 1015 Oral     SpO2 11/01/14 1015 94 %     Weight 11/01/14 1015 240 lb (108.863 kg)     Height 11/01/14 1015 5\' 2"  (1.575 m)     Head Cir --      Peak Flow --      Pain Score 11/01/14 1007 8     Pain Loc --      Pain Edu? --      Excl. in GC? --     Constitutional: Alert and oriented. Well appearing and in no acute distress. Eyes: Conjunctivae are normal. PERRL. EOMI. Head: Atraumatic. Nose: No congestion/rhinnorhea. Mouth/Throat: Mucous membranes are moist.  Neck: No stridor.  No cervical spine tenderness on palpation. Cardiovascular: Normal rate, regular rhythm. Grossly normal heart sounds.  Good peripheral circulation. Respiratory: Normal respiratory effort.  No retractions. Lungs CTAB. Gastrointestinal: Soft and nontender. No distention. No abdominal bruits. No CVA tenderness. Musculoskeletal: Right anterior knee pain. No gross deformity no effusion. Range of motion is restricted as secondary to patient's pain. Nontender pelvis nontender left knee, nontender bilateral ankles and feet. Patient is able to move upper extremities without any restrictions. Neurologic:  Normal  speech and language. No gross focal neurologic deficits are appreciated. Speech is normal. No gait instability. Skin:  Skin is warm, dry and intact. No rash or abrasions noted noted. Psychiatric: Mood and affect are normal. Speech is normal, patient is slow to answer questions. She is oriented 3.  ____________________________________________   LABS (all labs ordered are listed, but only abnormal results are displayed)  Labs Reviewed - No data to display RADIOLOGY  CT scan of the head per radiologist shows motion  artifact, no skull fracture or intracranial hemorrhage. CT scan of cervical spine no cervical fracture or subluxation. Reversal of normal cervical lordosis and degenerative changes were seen per radiologist. Right knee x-ray per radiologist shows no fracture there or degenerative changes. There was an incidental finding on the cervical CT. Addendum was made by the radiologist with a 1.4 cm nodule on the left lobe of thyroid gland and a 0.7 cm nodule on the right lobe. ____________________________________________   PROCEDURES  Procedure(s) performed: None  Critical Care performed: No  ____________________________________________   INITIAL IMPRESSION / ASSESSMENT AND PLAN / ED COURSE  Pertinent labs & imaging results that were available during my care of the patient were reviewed by me and considered in my medical decision making (see chart for details).  Patient was reevaluated after x-ray. On entering the room patient was asleep she was easily arousable she answered questions appropriately. Cranial nerves II through XII grossly intact. Patient was ambulatory. She is to follow-up with her regular doctor and continue on regular medication. She is instructed to use ice on her knee. ____________________________________________   FINAL CLINICAL IMPRESSION(S) / ED DIAGNOSES  Final diagnoses:  Head contusion, initial encounter  Cervical strain, acute, initial encounter  Contusion, knee, right, initial encounter      Tommi Rumps, PA-C 11/01/14 1322  Sharman Cheek, MD 11/01/14 1413

## 2014-11-01 NOTE — ED Notes (Signed)
NAD noted at time of D/C. Pt denies questions or concerns. Pt taken to the lobby via wheelchair at this time.  

## 2014-11-01 NOTE — Discharge Instructions (Signed)
Cervical Sprain A cervical sprain is when the tissues (ligaments) that hold the neck bones in place stretch or tear. HOME CARE   Put ice on the injured area.  Put ice in a plastic bag.  Place a towel between your skin and the bag.  Leave the ice on for 15-20 minutes, 3-4 times a day.  You may have been given a collar to wear. This collar keeps your neck from moving while you heal.  Do not take the collar off unless told by your doctor.  If you have long hair, keep it outside of the collar.  Ask your doctor before changing the position of your collar. You may need to change its position over time to make it more comfortable.  If you are allowed to take off the collar for cleaning or bathing, follow your doctor's instructions on how to do it safely.  Keep your collar clean by wiping it with mild soap and water. Dry it completely. If the collar has removable pads, remove them every 1-2 days to hand wash them with soap and water. Allow them to air dry. They should be dry before you wear them in the collar.  Do not drive while wearing the collar.  Only take medicine as told by your doctor.  Keep all doctor visits as told.  Keep all physical therapy visits as told.  Adjust your work station so that you have good posture while you work.  Avoid positions and activities that make your problems worse.  Warm up and stretch before being active. GET HELP IF:  Your pain is not controlled with medicine.  You cannot take less pain medicine over time as planned.  Your activity level does not improve as expected. GET HELP RIGHT AWAY IF:   You are bleeding.  Your stomach is upset.  You have an allergic reaction to your medicine.  You develop new problems that you cannot explain.  You lose feeling (become numb) or you cannot move any part of your body (paralysis).  You have tingling or weakness in any part of your body.  Your symptoms get worse. Symptoms include:  Pain,  soreness, stiffness, puffiness (swelling), or a burning feeling in your neck.  Pain when your neck is touched.  Shoulder or upper back pain.  Limited ability to move your neck.  Headache.  Dizziness.  Your hands or arms feel week, lose feeling, or tingle.  Muscle spasms.  Difficulty swallowing or chewing. MAKE SURE YOU:   Understand these instructions.  Will watch your condition.  Will get help right away if you are not doing well or get worse. Document Released: 11/15/2007 Document Revised: 01/29/2013 Document Reviewed: 12/04/2012 [facility] Patient Information 2015 [place], [place]. This information is not intended to replace advice given to you by your health care provider. Make sure you discuss any questions you have with your health care provider.    Ice to knee as needed for pain and swelling.  Continue your regular medication and follow up with your doctor this week if any continued problems

## 2014-12-11 ENCOUNTER — Encounter: Payer: Self-pay | Admitting: Emergency Medicine

## 2014-12-11 ENCOUNTER — Emergency Department
Admission: EM | Admit: 2014-12-11 | Discharge: 2014-12-11 | Disposition: A | Discharge status: Home / Self Care | Payer: BC Managed Care – PPO | Attending: Emergency Medicine | Admitting: Emergency Medicine

## 2014-12-11 DIAGNOSIS — G43909 Migraine, unspecified, not intractable, without status migrainosus: Secondary | ICD-10-CM | POA: Diagnosis not present

## 2014-12-11 DIAGNOSIS — Z79899 Other long term (current) drug therapy: Secondary | ICD-10-CM | POA: Insufficient documentation

## 2014-12-11 DIAGNOSIS — G43009 Migraine without aura, not intractable, without status migrainosus: Secondary | ICD-10-CM

## 2014-12-11 MED ORDER — METOCLOPRAMIDE HCL 5 MG/ML IJ SOLN
INTRAMUSCULAR | Status: AC
  Administered 2014-12-11: 10 mg via INTRAVENOUS
  Filled 2014-12-11: qty 2

## 2014-12-11 MED ORDER — METOCLOPRAMIDE HCL 5 MG/ML IJ SOLN
10.0000 mg | Freq: Once | INTRAMUSCULAR | Status: AC
  Administered 2014-12-11: 10 mg via INTRAVENOUS

## 2014-12-11 MED ORDER — SODIUM CHLORIDE 0.9 % IV SOLN
Freq: Once | INTRAVENOUS | Status: AC
  Administered 2014-12-11: 20:00:00 via INTRAVENOUS

## 2014-12-11 NOTE — ED Provider Notes (Signed)
[facility] Emergency Department Provider Note  ____________________________________________  Time seen: Approximately 7:54 PM  I have reviewed the triage vital signs and the nursing notes.   HISTORY  Chief Complaint Migraine   HPI Mariah Reeves is a 49 y.o. female with a history of migraines. Patient had a CT within the last few months which was normal. Patient reports this is her usual bad migraine. It started with a mild headache and progressed over the next several 3-4 days and to her usual bad migraine. Headache is unilateral and throbbing. Severe in nature. She also has some nausea.   Past Medical History  Diagnosis Date  . Depression   . Bipolar 1 disorder     There are no active problems to display for this patient.   Past Surgical History  Procedure Laterality Date  . Gastric bypass    . Knee surgery      Current Outpatient Rx  Name  Route  Sig  Dispense  Refill  . escitalopram (LEXAPRO) 20 MG tablet   Oral   Take 20 mg by mouth at bedtime.         . ferrous sulfate 325 (65 FE) MG tablet   Oral   Take 650 mg by mouth daily.         Marland Kitchen gabapentin (NEURONTIN) 600 MG tablet   Oral   Take 600 mg by mouth 2 (two) times daily.         Marland Kitchen HYDROcodone-acetaminophen (NORCO) 10-325 MG per tablet   Oral   Take 1 tablet by mouth 4 (four) times daily as needed for moderate pain.         Marland Kitchen lamoTRIgine (LAMICTAL) 200 MG tablet   Oral   Take 400 mg by mouth daily.         . Multiple Vitamins-Minerals (MULTIVITAMIN PO)   Oral   Take 1 tablet by mouth daily.         . ondansetron (ZOFRAN-ODT) 4 MG disintegrating tablet   Oral   Take 4 mg by mouth 3 (three) times daily as needed for nausea or vomiting.         . traZODone (DESYREL) 100 MG tablet   Oral   Take 100 mg by mouth at bedtime.           Allergies Contrast media  No family history on file.  Social History History  Substance Use Topics  . Smoking status:  Never Smoker   . Smokeless tobacco: Never Used  . Alcohol Use: No    Review of Systems Constitutional: No fever/chills Eyes: No visual changes. ENT: No sore throat. Cardiovascular: Denies chest pain. Respiratory: Denies shortness of breath. Gastrointestinal: No abdominal pain. No diarrhea.  No constipation. Genitourinary: Negative for dysuria. Musculoskeletal: Negative for back pain. Skin: Negative for rash.   ____________________________________________   PHYSICAL EXAM:  VITAL SIGNS: ED Triage Vitals  Enc Vitals Group     BP 12/11/14 1521 121/92 mmHg     Pulse Rate 12/11/14 1521 83     Resp 12/11/14 1521 18     Temp 12/11/14 1521 98.1 F (36.7 C)     Temp Source 12/11/14 1521 Oral     SpO2 12/11/14 1521 97 %     Weight 12/11/14 1521 210 lb (95.255 kg)     Height 12/11/14 1521 5' (1.524 m)     Head Cir --      Peak Flow --      Pain Score 12/11/14  1522 10     Pain Loc --      Pain Edu? --      Excl. in GC? --     Constitutional: Alert and oriented. Well appearing and in no acute distress. Eyes: Conjunctivae are normal. PERRL. EOMI. fundi are to see what appear normal Head: Atraumatic. Nose: No congestion/rhinnorhea. Mouth/Throat: Mucous membranes are moist.  Oropharynx non-erythematous. Neck: No stridor. Cardiovascular: Normal rate, regular rhythm. Grossly normal heart sounds.  Good peripheral circulation. Respiratory: Normal respiratory effort.  No retractions. Lungs CTAB. Gastrointestinal: Soft and nontender. No distention. No abdominal bruits. No CVA tenderness. Musculoskeletal: No lower extremity tenderness nor edema.  No joint effusions. Neurologic:  Normal speech and language. No gross focal neurologic deficits are appreciated. Speech is normal. Nerves II through XII are intact except for slight right facial droop. His facial droop is present to the same degree on her driver's license photo. Cerebellar finger-nose rapid alternating movements are intact. Motor  strength is 5 over 5 throughout sensation is intact throughout. Skin:  Skin is warm, dry and intact. No rash noted. Psychiatric: Mood and affect are normal. Speech and behavior are normal.  ____________________________________________   LABS (all labs ordered are listed, but only abnormal results are displayed)  Labs Reviewed - No data to display ____________________________________________  EKG   ____________________________________________  RADIOLOGY  CT was done and read by radiology as normal ____________________________________________   PROCEDURES    ____________________________________________   INITIAL IMPRESSION / ASSESSMENT AND PLAN / ED COURSE  Pertinent labs & imaging results that were available during my care of the patient were reviewed by me and considered in my medical decision making (see chart for details).   ____________________________________________   FINAL CLINICAL IMPRESSION(S) / ED DIAGNOSES  Final diagnoses:  Nonintractable migraine, unspecified migraine type      Arnaldo NatalPaul F Gilford Lardizabal, MD 12/11/14 2053

## 2014-12-11 NOTE — ED Notes (Signed)
Hx of migraines, started Tuesday as aheadache, got worse yesterday, tried to sleep but it did not help.   Pain is left in forehead.  Rates pain 9/10.  No neuro deficits noted, a&ox4

## 2014-12-11 NOTE — Discharge Instructions (Signed)
Migraine Headache A migraine headache is an intense, throbbing pain on one or both sides of your head. A migraine can last for 30 minutes to several hours. CAUSES  The exact cause of a migraine headache is not always known. However, a migraine may be caused when nerves in the brain become irritated and release chemicals that cause inflammation. This causes pain. Certain things may also trigger migraines, such as:  Alcohol.  Smoking.  Stress.  Menstruation.  Aged cheeses.  Foods or drinks that contain nitrates, glutamate, aspartame, or tyramine.  Lack of sleep.  Chocolate.  Caffeine.  Hunger.  Physical exertion.  Fatigue.  Medicines used to treat chest pain (nitroglycerine), birth control pills, estrogen, and some blood pressure medicines. SIGNS AND SYMPTOMS  Pain on one or both sides of your head.  Pulsating or throbbing pain.  Severe pain that prevents daily activities.  Pain that is aggravated by any physical activity.  Nausea, vomiting, or both.  Dizziness.  Pain with exposure to bright lights, loud noises, or activity.  General sensitivity to bright lights, loud noises, or smells. Before you get a migraine, you may get warning signs that a migraine is coming (aura). An aura may include:  Seeing flashing lights.  Seeing bright spots, halos, or zigzag lines.  Having tunnel vision or blurred vision.  Having feelings of numbness or tingling.  Having trouble talking.  Having muscle weakness. DIAGNOSIS  A migraine headache is often diagnosed based on:  Symptoms.  Physical exam.  A CT scan or MRI of your head. These imaging tests cannot diagnose migraines, but they can help rule out other causes of headaches. TREATMENT Medicines may be given for pain and nausea. Medicines can also be given to help prevent recurrent migraines.  HOME CARE INSTRUCTIONS  Only take over-the-counter or prescription medicines for pain or discomfort as directed by your  health care provider. The use of long-term narcotics is not recommended.  Lie down in a dark, quiet room when you have a migraine.  Keep a journal to find out what may trigger your migraine headaches. For example, write down:  What you eat and drink.  How much sleep you get.  Any change to your diet or medicines.  Limit alcohol consumption.  Quit smoking if you smoke.  Get 7-9 hours of sleep, or as recommended by your health care provider.  Limit stress.  Keep lights dim if bright lights bother you and make your migraines worse. SEEK IMMEDIATE MEDICAL CARE IF:   Your migraine becomes severe.  You have a fever.  You have a stiff neck.  You have vision loss.  You have muscular weakness or loss of muscle control.  You start losing your balance or have trouble walking.  You feel faint or pass out.  You have severe symptoms that are different from your first symptoms. MAKE SURE YOU:   Understand these instructions.  Will watch your condition.  Will get help right away if you are not doing well or get worse. Document Released: 05/29/2005 Document Revised: 10/13/2013 Document Reviewed: 02/03/2013 [facility] Patient Information 2015 [place], [place]. This information is not intended to replace advice given to you by your health care provider. Make sure you discuss any questions you have with your health care provider.   Please return as needed. Follow up with your doctor.

## 2014-12-16 ENCOUNTER — Emergency Department
Admission: EM | Admit: 2014-12-16 | Discharge: 2014-12-16 | Disposition: A | Discharge status: Home / Self Care | Payer: BC Managed Care – PPO | Attending: Student | Admitting: Student

## 2014-12-16 DIAGNOSIS — M545 Low back pain, unspecified: Secondary | ICD-10-CM

## 2014-12-16 DIAGNOSIS — G8929 Other chronic pain: Secondary | ICD-10-CM | POA: Diagnosis not present

## 2014-12-16 DIAGNOSIS — Z79899 Other long term (current) drug therapy: Secondary | ICD-10-CM | POA: Diagnosis not present

## 2014-12-16 MED ORDER — KETOROLAC TROMETHAMINE 60 MG/2ML IM SOLN
INTRAMUSCULAR | Status: AC
  Administered 2014-12-16: 60 mg via INTRAMUSCULAR
  Filled 2014-12-16: qty 2

## 2014-12-16 MED ORDER — HYDROCODONE-ACETAMINOPHEN 5-325 MG PO TABS: 1.0000 | ORAL_TABLET | Freq: Three times a day (TID) | ORAL | Status: DC | PRN

## 2014-12-16 MED ORDER — BACLOFEN 10 MG PO TABS: 10.0000 mg | ORAL_TABLET | Freq: Three times a day (TID) | ORAL | Status: DC

## 2014-12-16 MED ORDER — TIZANIDINE HCL 2 MG PO TABS: 2.0000 mg | ORAL_TABLET | Freq: Three times a day (TID) | ORAL | Status: DC

## 2014-12-16 MED ORDER — KETOROLAC TROMETHAMINE 60 MG/2ML IM SOLN
60.0000 mg | Freq: Once | INTRAMUSCULAR | Status: AC
  Administered 2014-12-16: 60 mg via INTRAMUSCULAR

## 2014-12-16 NOTE — ED Notes (Signed)
Having lower back pain which is moving into left leg. Denies any injury or trauma. Ambulates well to treatment area .

## 2014-12-16 NOTE — ED Provider Notes (Signed)
[facility] Provider Note ____________________________________________  Time seen: 1613  I have reviewed the triage vital signs and the nursing notes.  HISTORY  Chief Complaint  Back Pain   HPI Mariah Reeves is a 49 y.o. female who reports to the ED for worsening of her LBP with sciatica. She describes pain the anterior left thigh for the last 2 days.  She denies incontinence, foot drop, or leg weakness.  She is followed by Dr. Quillian QuinceBliss, and usually doses Vicodin,Gabapentin, and Citalopram.  She is scheduled to see Dr. Quillian QuinceBliss on Monday.  She is a gastric bypass patient.She rates her pain at an 8 of 10 in triage.  Past Medical History  Diagnosis Date  . Depression   . Bipolar 1 disorder    There are no active problems to display for this patient.  Past Surgical History  Procedure Laterality Date  . Gastric bypass    . Knee surgery      Current Outpatient Rx  Name  Route  Sig  Dispense  Refill  . baclofen (LIORESAL) 10 MG tablet   Oral   Take 1 tablet (10 mg total) by mouth 3 (three) times daily.   30 each   0   . escitalopram (LEXAPRO) 20 MG tablet   Oral   Take 20 mg by mouth at bedtime.         . ferrous sulfate 325 (65 FE) MG tablet   Oral   Take 650 mg by mouth daily.         Marland Kitchen. gabapentin (NEURONTIN) 600 MG tablet   Oral   Take 600 mg by mouth 2 (two) times daily.         Marland Kitchen. HYDROcodone-acetaminophen (NORCO) 10-325 MG per tablet   Oral   Take 1 tablet by mouth 4 (four) times daily as needed for moderate pain.         Marland Kitchen. HYDROcodone-acetaminophen (NORCO) 5-325 MG per tablet   Oral   Take 1 tablet by mouth every 8 (eight) hours as needed for moderate pain.   10 tablet   0   . lamoTRIgine (LAMICTAL) 200 MG tablet   Oral   Take 400 mg by mouth daily.         . Multiple Vitamins-Minerals (MULTIVITAMIN PO)   Oral   Take 1 tablet by mouth daily.         . ondansetron (ZOFRAN-ODT) 4 MG disintegrating  tablet   Oral   Take 4 mg by mouth 3 (three) times daily as needed for nausea or vomiting.         Marland Kitchen. tiZANidine (ZANAFLEX) 2 MG tablet   Oral   Take 1 tablet (2 mg total) by mouth 3 (three) times daily.   12 tablet   0   . traZODone (DESYREL) 100 MG tablet   Oral   Take 100 mg by mouth at bedtime.          Allergies Contrast media  No family history on file.  Social History History  Substance Use Topics  . Smoking status: Never Smoker   . Smokeless tobacco: Never Used  . Alcohol Use: No   Review of Systems  Constitutional: Negative for fever. Eyes: Negative for visual changes. ENT: Negative for sore throat. Cardiovascular: Negative for chest pain. Respiratory: Negative for shortness of breath. Gastrointestinal: Negative for abdominal pain, vomiting and diarrhea. Genitourinary: Negative for dysuria. Musculoskeletal: Positive for back pain. Skin: Negative for rash. Neurological: Negative for headaches,  focal weakness or numbness. ____________________________________________  PHYSICAL EXAM:  VITAL SIGNS: ED Triage Vitals  Enc Vitals Group     BP 12/16/14 1558 126/89 mmHg     Pulse Rate 12/16/14 1558 98     Resp 12/16/14 1558 16     Temp 12/16/14 1558 98.6 F (37 C)     Temp Source 12/16/14 1558 Oral     SpO2 12/16/14 1558 98 %     Weight 12/16/14 1558 205 lb (92.987 kg)     Height 12/16/14 1558 5' (1.524 m)     Head Cir --      Peak Flow --      Pain Score 12/16/14 1557 8     Pain Loc --      Pain Edu? --      Excl. in GC? --    Constitutional: Alert and oriented. Well appearing and in no distress. Eyes: Conjunctivae are normal. PERRL. Normal extraocular movements. ENT   Head: Normocephalic and atraumatic.   Nose: No congestion/rhinnorhea.   Mouth/Throat: Mucous membranes are moist.   Neck: Supple. No thyromegaly. Hematological/Lymphatic/Immunilogical: No cervical lymphadenopathy. Cardiovascular: Normal rate, regular rhythm.   Respiratory: Normal respiratory effort. No wheezes/rales/rhonchi. Gastrointestinal: Soft and nontender. No distention. Musculoskeletal: Nontender with normal range of motion in all extremities. Normal spinal alignment without spasm, deformity, or step-off. Normal supine to sit transition. Normal lumbar ROM. Negative SLR.  Neurologic:  Normal gait without ataxia. Normal speech and language. No gross focal neurologic deficits are appreciated. Normal DTRs bilaterally. Normal toe/heel raise.  Skin:  Skin is warm, dry and intact. No rash noted. Psychiatric: Mood and affect are normal. Patient exhibits appropriate insight and judgment. ____________________________________________  PROCEDURES  Toradol 60 mg IM ____________________________________________  INITIAL IMPRESSION / ASSESSMENT AND PLAN / ED COURSE Acute flare of baseline chronic LBP. Pain improved at discharge. Patient discharged with Norco #10 and Baclofen #30 (on formulary) and Zanaflex #12 (non-formulary). Patient will fill the cheaper muscle relaxant. She has previously used Flexeril without benefit. ____________________________________________  FINAL CLINICAL IMPRESSION(S) / ED DIAGNOSES  Final diagnoses:  Chronic lower back pain  Acute exacerbation of chronic low back pain     Lissa Hoard, PA-C 12/17/14 1636  Gayla Doss, MD 12/17/14 2317

## 2014-12-16 NOTE — Discharge Instructions (Signed)
Back Pain, Adult Back pain is very common. The pain often gets better over time. The cause of back pain is usually not dangerous. Most people can learn to manage their back pain on their own.  HOME CARE   Stay active. Start with short walks on flat ground if you can. Try to walk farther each day.  Do not sit, drive, or stand in one place for more than 30 minutes. Do not stay in bed.  Do not avoid exercise or work. Activity can help your back heal faster.  Be careful when you bend or lift an object. Bend at your knees, keep the object close to you, and do not twist.  Sleep on a firm mattress. Lie on your side, and bend your knees. If you lie on your back, put a pillow under your knees.  Only take medicines as told by your doctor.  Put ice on the injured area.  Put ice in a plastic bag.  Place a towel between your skin and the bag.  Leave the ice on for 15-20 minutes, 03-04 times a day for the first 2 to 3 days. After that, you can switch between ice and heat packs.  Ask your doctor about back exercises or massage.  Avoid feeling anxious or stressed. Find good ways to deal with stress, such as exercise. GET HELP RIGHT AWAY IF:   Your pain does not go away with rest or medicine.  Your pain does not go away in 1 week.  You have new problems.  You do not feel well.  The pain spreads into your legs.  You cannot control when you poop (bowel movement) or pee (urinate).  Your arms or legs feel weak or lose feeling (numbness).  You feel sick to your stomach (nauseous) or throw up (vomit).  You have belly (abdominal) pain.  You feel like you may pass out (faint). MAKE SURE YOU:   Understand these instructions.  Will watch your condition.  Will get help right away if you are not doing well or get worse. Document Released: 11/15/2007 Document Revised: 08/21/2011 Document Reviewed: 09/30/2013 [facility] Patient Information 2015 [place], [place]. This information is not intended  to replace advice given to you by your health care provider. Make sure you discuss any questions you have with your health care provider.  Chronic Back Pain  When back pain lasts longer than 3 months, it is called chronic back pain.People with chronic back pain often go through certain periods that are more intense (flare-ups).  CAUSES Chronic back pain can be caused by wear and tear (degeneration) on different structures in your back. These structures include:  The bones of your spine (vertebrae) and the joints surrounding your spinal cord and nerve roots (facets).  The strong, fibrous tissues that connect your vertebrae (ligaments). Degeneration of these structures may result in pressure on your nerves. This can lead to constant pain. HOME CARE INSTRUCTIONS  Avoid bending, heavy lifting, prolonged sitting, and activities which make the problem worse.  Take brief periods of rest throughout the day to reduce your pain. Lying down or standing usually is better than sitting while you are resting.  Take over-the-counter or prescription medicines only as directed by your caregiver. SEEK IMMEDIATE MEDICAL CARE IF:   You have weakness or numbness in one of your legs or feet.  You have trouble controlling your bladder or bowels.  You have nausea, vomiting, abdominal pain, shortness of breath, or fainting. Document Released: 07/06/2004 Document Revised: 08/21/2011 Document  Reviewed: 05/13/2011 [employer] Patient Information 2015 [employer] [place]. This information is not intended to replace advice given to you by your health care provider. Make sure you discuss any questions you have with your health care provider.  Take the prescription muscle relaxant as directed. Follow-up with Dr. Quillian QuinceBliss, as scheduled for follow-up pain management.  Apply ice to any sore muscles and rest as needed.

## 2014-12-16 NOTE — ED Notes (Signed)
Pt c/o left lower back pain

## 2014-12-21 ENCOUNTER — Other Ambulatory Visit: Payer: Self-pay | Admitting: Family Medicine

## 2014-12-21 DIAGNOSIS — E042 Nontoxic multinodular goiter: Secondary | ICD-10-CM

## 2014-12-21 DIAGNOSIS — Z1231 Encounter for screening mammogram for malignant neoplasm of breast: Secondary | ICD-10-CM

## 2014-12-24 ENCOUNTER — Ambulatory Visit: Payer: BC Managed Care – PPO | Attending: Pain Medicine | Admitting: Pain Medicine

## 2014-12-24 ENCOUNTER — Encounter: Payer: Self-pay | Admitting: Pain Medicine

## 2014-12-24 ENCOUNTER — Other Ambulatory Visit: Payer: Self-pay | Admitting: Pain Medicine

## 2014-12-24 VITALS — BP 126/82 | HR 93 | Temp 97.6°F | Resp 16 | Ht 60.0 in | Wt 204.0 lb

## 2014-12-24 DIAGNOSIS — M545 Low back pain: Secondary | ICD-10-CM | POA: Diagnosis present

## 2014-12-24 DIAGNOSIS — G40909 Epilepsy, unspecified, not intractable, without status epilepticus: Secondary | ICD-10-CM | POA: Insufficient documentation

## 2014-12-24 DIAGNOSIS — G43909 Migraine, unspecified, not intractable, without status migrainosus: Secondary | ICD-10-CM | POA: Diagnosis not present

## 2014-12-24 DIAGNOSIS — M5481 Occipital neuralgia: Secondary | ICD-10-CM | POA: Diagnosis not present

## 2014-12-24 DIAGNOSIS — M533 Sacrococcygeal disorders, not elsewhere classified: Secondary | ICD-10-CM | POA: Insufficient documentation

## 2014-12-24 DIAGNOSIS — N200 Calculus of kidney: Secondary | ICD-10-CM | POA: Diagnosis not present

## 2014-12-24 DIAGNOSIS — I10 Essential (primary) hypertension: Secondary | ICD-10-CM | POA: Insufficient documentation

## 2014-12-24 DIAGNOSIS — Z9884 Bariatric surgery status: Secondary | ICD-10-CM | POA: Diagnosis not present

## 2014-12-24 DIAGNOSIS — Z9889 Other specified postprocedural states: Secondary | ICD-10-CM | POA: Diagnosis not present

## 2014-12-24 DIAGNOSIS — M79604 Pain in right leg: Secondary | ICD-10-CM | POA: Diagnosis present

## 2014-12-24 DIAGNOSIS — M47816 Spondylosis without myelopathy or radiculopathy, lumbar region: Secondary | ICD-10-CM

## 2014-12-24 DIAGNOSIS — M79605 Pain in left leg: Secondary | ICD-10-CM | POA: Diagnosis present

## 2014-12-24 DIAGNOSIS — M51369 Other intervertebral disc degeneration, lumbar region without mention of lumbar back pain or lower extremity pain: Secondary | ICD-10-CM

## 2014-12-24 DIAGNOSIS — M5136 Other intervertebral disc degeneration, lumbar region: Secondary | ICD-10-CM

## 2014-12-24 DIAGNOSIS — F319 Bipolar disorder, unspecified: Secondary | ICD-10-CM | POA: Diagnosis not present

## 2014-12-24 HISTORY — DX: Other intervertebral disc degeneration, lumbar region without mention of lumbar back pain or lower extremity pain: M51.369

## 2014-12-24 NOTE — Progress Notes (Signed)
Safety precautions to be maintained throughout the outpatient stay will include: orient to surroundings, keep bed in low position, maintain call bell within reach at all times, provide assistance with transfer out of bed and ambulation.  Discharge patient home.
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
## 2014-12-24 NOTE — Patient Instructions (Addendum)
Continue present medications  F/U PCP Dr. Quillian QuinceBliss evaliation of  BP and general medical  condition.  F/U surgical evaluation  F/U neurological evaluation  F/U psych evaluation with your psychiatrist  Dr. Lennice SitesHelton  as discussed  Lumbar MRI. Please ask receptionist the date of your lumbar MRI  May consider radiofrequency rhizolysis or intraspinal procedures pending response to present treatment and F/U evaluation.  Patient to call Pain Management Center should patient have concerns prior to scheduled return appointment.

## 2014-12-27 DIAGNOSIS — M5432 Sciatica, left side: Secondary | ICD-10-CM | POA: Diagnosis not present

## 2014-12-27 DIAGNOSIS — M545 Low back pain: Secondary | ICD-10-CM | POA: Diagnosis present

## 2014-12-27 DIAGNOSIS — Z79899 Other long term (current) drug therapy: Secondary | ICD-10-CM | POA: Diagnosis not present

## 2014-12-27 DIAGNOSIS — G8929 Other chronic pain: Secondary | ICD-10-CM | POA: Insufficient documentation

## 2014-12-27 NOTE — ED Notes (Signed)
Patient reports history of chronic back pain and having severe pain tonight.  Reports pain clinic she is going to has a policy of not prescribing pain medication for the first 2 months.

## 2014-12-28 ENCOUNTER — Emergency Department
Admission: EM | Admit: 2014-12-28 | Discharge: 2014-12-28 | Disposition: A | Discharge status: Home / Self Care | Payer: BC Managed Care – PPO | Attending: Student | Admitting: Student

## 2014-12-28 ENCOUNTER — Ambulatory Visit: Payer: BC Managed Care – PPO

## 2014-12-28 DIAGNOSIS — M545 Low back pain, unspecified: Secondary | ICD-10-CM

## 2014-12-28 DIAGNOSIS — G8929 Other chronic pain: Secondary | ICD-10-CM

## 2014-12-28 DIAGNOSIS — M5432 Sciatica, left side: Secondary | ICD-10-CM

## 2014-12-28 MED ORDER — KETOROLAC TROMETHAMINE 60 MG/2ML IM SOLN
60.0000 mg | Freq: Once | INTRAMUSCULAR | Status: AC
  Administered 2014-12-28: 60 mg via INTRAMUSCULAR
  Filled 2014-12-28: qty 2

## 2014-12-28 NOTE — ED Notes (Signed)
Pt sleeping soundly when arrived in room and did not wake until physically stimulated. Pt c/o exacerbation of chronic low back pain which she rates at 10/10, initially. Pt states normal level of pain is 8/10. Pt states she has started at the pain clinic and normally takes vicodin and baclofen for her pain and has taken those medications as prescribed today. Pt in no observable acute distress at time of assessment.

## 2014-12-28 NOTE — ED Provider Notes (Signed)
[facility] Emergency Department Provider Note  ____________________________________________  Time seen: Approximately 12:33 AM  I have reviewed the triage vital signs and the nursing notes.   HISTORY  Chief Complaint Back Pain    HPI MARIEN Reeves is a 49 y.o. female with history of bipolar disorder, chronic back pain/left sided sciatica who presents for exacerbation of chronic left back pain. Patient reports that this began gradually yesterday, has been constant since onset. She reports pain that radiates into the left thigh. This is similar to her usual sciatica flares. She denies any loss of control of bowel or bladder function, fevers, history of malignancy, recent traumas, numbness or weakness in the legs, history of IV drug use. Current severity of pain is mild to moderate. Walking makes the pain worse.   Past Medical History  Diagnosis Date  . Depression   . Bipolar 1 disorder   . Allergy     Patient Active Problem List   Diagnosis Date Noted  . DDD (degenerative disc disease), lumbar 12/24/2014  . Facet syndrome, lumbar 12/24/2014  . Sacroiliac joint dysfunction 12/24/2014    Past Surgical History  Procedure Laterality Date  . Gastric bypass    . Knee surgery    . Cholecystectomy    . Carpal tunnel release Left   . Nasal septum surgery    . Middle ear surgery      Current Outpatient Rx  Name  Route  Sig  Dispense  Refill  . baclofen (LIORESAL) 10 MG tablet   Oral   Take 1 tablet (10 mg total) by mouth 3 (three) times daily.   30 each   0   . escitalopram (LEXAPRO) 20 MG tablet   Oral   Take 20 mg by mouth at bedtime.         . ferrous sulfate 325 (65 FE) MG tablet   Oral   Take 650 mg by mouth daily.         Marland Kitchen gabapentin (NEURONTIN) 600 MG tablet   Oral   Take 600 mg by mouth 2 (two) times daily.         Marland Kitchen HYDROcodone-acetaminophen (NORCO) 10-325 MG per tablet   Oral   Take 1 tablet by mouth 4 (four) times daily as  needed for moderate pain.         Marland Kitchen HYDROcodone-acetaminophen (NORCO) 5-325 MG per tablet   Oral   Take 1 tablet by mouth every 8 (eight) hours as needed for moderate pain. Patient not taking: Reported on 12/24/2014   10 tablet   0   . lamoTRIgine (LAMICTAL) 200 MG tablet   Oral   Take 400 mg by mouth daily.         . Multiple Vitamins-Minerals (MULTIVITAMIN PO)   Oral   Take 1 tablet by mouth daily.         . ondansetron (ZOFRAN-ODT) 4 MG disintegrating tablet   Oral   Take 4 mg by mouth 3 (three) times daily as needed for nausea or vomiting.         Marland Kitchen tiZANidine (ZANAFLEX) 2 MG tablet   Oral   Take 1 tablet (2 mg total) by mouth 3 (three) times daily.   12 tablet   0   . traZODone (DESYREL) 100 MG tablet   Oral   Take 100 mg by mouth at bedtime as needed.            Allergies Contrast media  Family History  Problem  Relation Age of Onset  . Hypertension Mother   . Diabetes Mother   . Heart disease Father   . Alcohol abuse Father     Social History History  Substance Use Topics  . Smoking status: Never Smoker   . Smokeless tobacco: Never Used  . Alcohol Use: No    Review of Systems Constitutional: No fever/chills Eyes: No visual changes. ENT: No sore throat. Cardiovascular: Denies chest pain. Respiratory: Denies shortness of breath. Gastrointestinal: No abdominal pain.  No nausea, no vomiting.  No diarrhea.  No constipation. Genitourinary: Negative for dysuria. Musculoskeletal: Positive for back pain. Skin: Negative for rash. Neurological: Negative for headaches, focal weakness or numbness.  10-point ROS otherwise negative.  ____________________________________________   PHYSICAL EXAM:  VITAL SIGNS: ED Triage Vitals  Enc Vitals Group     BP 12/27/14 2307 144/79 mmHg     Pulse Rate 12/27/14 2307 84     Resp 12/27/14 2307 20     Temp 12/27/14 2307 98.8 F (37.1 C)     Temp Source 12/27/14 2307 Oral     SpO2 12/27/14 2307 95 %      Weight 12/27/14 2307 205 lb (92.987 kg)     Height 12/27/14 2307 5' (1.524 m)     Head Cir --      Peak Flow --      Pain Score 12/27/14 2308 10     Pain Loc --      Pain Edu? --      Excl. in GC? --     Constitutional: Alert and oriented. Well appearing and in no acute distress. Eyes: Conjunctivae are normal. PERRL. EOMI. Head: Atraumatic. Nose: No congestion/rhinnorhea. Mouth/Throat: Mucous membranes are moist.  Oropharynx non-erythematous. Neck: No stridor.   Cardiovascular: Normal rate, regular rhythm. Grossly normal heart sounds.  Good peripheral circulation. Respiratory: Normal respiratory effort.  No retractions. Lungs CTAB. Gastrointestinal: Soft and nontender. No distention. No abdominal bruits. No CVA tenderness. Genitourinary: deferred Musculoskeletal: No lower extremity tenderness nor edema.  No joint effusions. No midline tenderness to palpation throughout the L-spine. Neurologic:  Normal speech and language. No gross focal neurologic deficits are appreciated. No gait instability. Positive straight leg raise at 40 in the left lower extremity. Strong dorsiflexion of bilateral big toes. Skin:  Skin is warm, dry and intact. No rash noted. Psychiatric: Mood and affect are normal. Speech and behavior are normal.  ____________________________________________   LABS (all labs ordered are listed, but only abnormal results are displayed)  Labs Reviewed - No data to display ____________________________________________  EKG  none ____________________________________________  RADIOLOGY  none ____________________________________________   PROCEDURES  Procedure(s) performed: None  Critical Care performed: No  ____________________________________________   INITIAL IMPRESSION / ASSESSMENT AND PLAN / ED COURSE  Pertinent labs & imaging results that were available during my care of the patient were reviewed by me and considered in my medical decision making (see  chart for details).  Mariah Reeves is a 49 y.o. female with history of bipolar disorder, chronic back pain/left sided sciatica who presents for exacerbation of chronic left back pain. On exam, she is sleeping and her pain appears well controlled. She awakens to voice/touch. She does have positive straight leg raise on the left but she has an intact neurological examination. No red flags concerning for cauda equina or epidural abscess. Suspect exacerbation of her chronic sciatica pain. She has a pain contract with her primary care doctor and is currently being evaluated by pain clinic. Additionally she  did receive narcotics here on 12/17/2014. I discussed with her that we are not able to treat her chronic pain with narcotic pain medications. She reports to me that Toradol injection has been helpful for her in the past therefore will give Toradol, encouraged her to follow up with her primary care doctor and her pain doctor. Return precautions discussed, DC home. She is comfortable with the discharge plan. ____________________________________________   FINAL CLINICAL IMPRESSION(S) / ED DIAGNOSES  Final diagnoses:  Sciatica, left  Chronic low back pain      Gayla DossEryka A Blayke Cordrey, MD 12/28/14 0200

## 2014-12-29 ENCOUNTER — Ambulatory Visit: Payer: BC Managed Care – PPO | Admitting: Pain Medicine

## 2014-12-29 ENCOUNTER — Ambulatory Visit: Payer: BC Managed Care – PPO

## 2014-12-31 ENCOUNTER — Emergency Department
Admission: EM | Admit: 2014-12-31 | Discharge: 2015-01-01 | Disposition: A | Discharge status: Home / Self Care | Payer: BC Managed Care – PPO | Attending: Physician Assistant | Admitting: Physician Assistant

## 2014-12-31 ENCOUNTER — Emergency Department: Payer: BC Managed Care – PPO

## 2014-12-31 ENCOUNTER — Ambulatory Visit: Admission: RE | Admit: 2014-12-31 | Payer: BC Managed Care – PPO | Source: Ambulatory Visit

## 2014-12-31 DIAGNOSIS — S0990XA Unspecified injury of head, initial encounter: Secondary | ICD-10-CM

## 2014-12-31 DIAGNOSIS — Y9289 Other specified places as the place of occurrence of the external cause: Secondary | ICD-10-CM | POA: Diagnosis not present

## 2014-12-31 DIAGNOSIS — Y9389 Activity, other specified: Secondary | ICD-10-CM | POA: Diagnosis not present

## 2014-12-31 DIAGNOSIS — F319 Bipolar disorder, unspecified: Secondary | ICD-10-CM | POA: Diagnosis not present

## 2014-12-31 DIAGNOSIS — W1800XA Striking against unspecified object with subsequent fall, initial encounter: Secondary | ICD-10-CM

## 2014-12-31 DIAGNOSIS — E119 Type 2 diabetes mellitus without complications: Secondary | ICD-10-CM | POA: Diagnosis not present

## 2014-12-31 DIAGNOSIS — G8929 Other chronic pain: Secondary | ICD-10-CM

## 2014-12-31 DIAGNOSIS — W01198A Fall on same level from slipping, tripping and stumbling with subsequent striking against other object, initial encounter: Secondary | ICD-10-CM | POA: Diagnosis not present

## 2014-12-31 DIAGNOSIS — Y998 Other external cause status: Secondary | ICD-10-CM | POA: Diagnosis not present

## 2014-12-31 NOTE — ED Provider Notes (Signed)
[facility] Emergency Department Provider Note  ____________________________________________  Time seen: Approximately 10:51 PM  I have reviewed the triage vital signs and the nursing notes.   HISTORY  Chief Complaint Fall and Head Injury    HPI Mariah Reeves is a 49 y.o. female with a history of diabetes, bipolar disorder and chronic pain who presents to the emergency room via EMS after a fall. She tripped over her vacuum and hit her head on the floor. She has swelling to the left lateral scalp. She takes hydrocodone 10 mg 4 times a day for her chronic pain. She is also on gabapentin. She has been more unsteady recently and reports recurrent falls. Currently she is nauseated as well. She denies loss of consciousness with current fall. She is currently not dizzy and denies visual changes. She believes she is weak at times, drops objects out of her hands at times. She denies fever or sore throat, or neck pain. She has left thigh pain from a previous fall as well as right knee pain. She denies any other bruising, other than her scalp. She denies hand elbow or shoulder pain. No abdominal pain. She has chronic low back pain. Dr. Quillian Quince is her primary physician. She also sees a psychiatrist as well as the pain clinic.   Past Medical History  Diagnosis Date  . Depression   . Bipolar affective disorder   . Diabetes mellitus without complication     There are no active problems to display for this patient.   Past Surgical History  Procedure Laterality Date  . Replacement total knee    . Gastric bypass      No current outpatient prescriptions on file.  Allergies Ivp dye  No family history on file.  Social History History  Substance Use Topics  . Smoking status: Never Smoker   . Smokeless tobacco: Not on file  . Alcohol Use: No    Review of Systems Constitutional: No fever/chills Eyes: No visual changes. ENT: No sore throat. Cardiovascular: Denies  chest pain. Respiratory: Denies shortness of breath. Gastrointestinal: No abdominal pain.  mild nausea, no vomiting. Genitourinary: Negative for dysuria. Musculoskeletal: chronic back pain. Skin: Negative for rash. Neurological: see HPI.   10-point ROS otherwise negative.  ____________________________________________   PHYSICAL EXAM:  VITAL SIGNS: ED Triage Vitals  Enc Vitals Group     BP 12/31/14 2212 134/75 mmHg     Pulse Rate 12/31/14 2212 103     Resp 12/31/14 2212 18     Temp 12/31/14 2212 98.9 F (37.2 C)     Temp Source 12/31/14 2212 Oral     SpO2 12/31/14 2204 98 %     Weight 12/31/14 2212 205 lb (92.987 kg)     Height 12/31/14 2212 5' (1.524 m)     Head Cir --      Peak Flow --      Pain Score 12/31/14 2213 8     Pain Loc --      Pain Edu? --      Excl. in GC? --     Constitutional: Alert and oriented. Responses to questions are slow, though clear and articulate. Appears sleepy. Eyes: Conjunctivae are normal. EOMI. Head: Atraumatic. Nose: No congestion/rhinnorhea. Mouth/Throat: Mucous membranes are moist.  Oropharynx non-erythematous. Neck: Non tender cervical spine.   Hematological/Lymphatic/Immunilogical: No cervical lymphadenopathy. cardiovascular: Normal rate, regular rhythm. Grossly normal heart sounds.  Good peripheral circulation. Respiratory: Normal respiratory effort.  No retractions. Lungs CTAB. Gastrointestinal: Soft and  nontender. No distention. No abdominal bruits. No CVA tenderness. Musculoskeletal: lower extremity tenderness to the left thigh w/o ecchymosis.  no edema.  No joint effusions. Neurologic:  Normal speech and language. No gross focal neurologic deficits are appreciated. No gait instability. Cranial nerves II through XII grossly intact. Skin:  Skin is warm, dry and intact. No rash noted. Psychiatric: Mood and affect are normal. Speech and behavior are normal.  ____________________________________________   LABS (all labs ordered  are listed, but only abnormal results are displayed)  Labs Reviewed - No data to display ____________________________________________  EKG   ____________________________________________  RADIOLOGY  EXAM: CT HEAD WITHOUT CONTRAST  TECHNIQUE: Contiguous axial images were obtained from the base of the skull through the vertex without intravenous contrast.  COMPARISON: None.  FINDINGS: There is no evidence of acute infarction, mass lesion, or intra- or extra-axial hemorrhage on CT.  The posterior fossa, including the cerebellum, brainstem and fourth ventricle, is within normal limits. The third and lateral ventricles, and basal ganglia are unremarkable in appearance. The cerebral hemispheres are symmetric in appearance, with normal gray-white differentiation. No mass effect or midline shift is seen.  There is no evidence of fracture; visualized osseous structures are unremarkable in appearance. The orbits are within normal limits. Mild mucosal thickening is noted at the right frontal sinus. The remaining paranasal sinuses and mastoid air cells are well-aerated. Mild soft tissue injury is noted overlying the high left parietal calvarium.  IMPRESSION: 1. No evidence of traumatic intracranial injury or fracture. 2. Mild soft tissue injury overlying the high left parietal calvarium. 3. Mild mucosal thickening at the right frontal sinus.   Electronically Signed  By: Roanna Raider M.D.  On: 01/01/2015 01:00 ____________________________________________   PROCEDURES  Procedure(s) performed: None  Critical Care performed: No  ____________________________________________   INITIAL IMPRESSION / ASSESSMENT AND PLAN / ED COURSE  Pertinent labs & imaging results that were available during my care of the patient were reviewed by me and considered in my medical decision making (see chart for details).  49 year old female with history of diabetes, bipolar  disorder, depression and chronic pain who suffered a fall and a head injury resulting in a hematoma to the left scalp. CT of the head with no acute findings other than superficial changes. Because of her recent history of falls, she is strongly encouraged to follow-up with Dr. Quillian Quince soon to further evaluate her medications. She should also follow up with her psychiatrist. She is aware that her current medications can make her drowsy.   ____________________________________________   FINAL CLINICAL IMPRESSION(S) / ED DIAGNOSES  Final diagnoses:  Head, injury without skull fracture Bipolar disorder Chronic pain     Ignacia Bayley, PA-C 01/01/15 0112  Loleta Rose, MD 01/02/15 [phone]

## 2014-12-31 NOTE — ED Notes (Signed)
Patient present to ED with complaint of fall after tripping over her vacuum cleaner 30 min PTA and hit head on dresser. Hematoma noted to left side of head. Patient denies dizziness, blurry vision, chest pain, abdominal pain, shortness of breath. Patient reports " I have been falling for the last couple of days..I fell yesterday, it's almost like I lose my balance and I try to grab something and fall." Patient complaining of headache and now nausea when patient moved from EMS stretcher to bed. Per EMS patient walked to EMS vehicle. Patient alert and oriented, calm and cooperative, respirations even and unlabored.

## 2015-01-01 ENCOUNTER — Ambulatory Visit: Payer: BC Managed Care – PPO | Attending: Family Medicine

## 2015-01-01 NOTE — ED Notes (Signed)
Patient transported to CT 

## 2015-01-01 NOTE — Discharge Instructions (Signed)
Head Injury You have a head injury. Headaches and throwing up (vomiting) are common after a head injury. It should be easy to wake up from sleeping. Sometimes you must stay in the hospital. Most problems happen within the first 24 hours. Side effects may occur up to 7-10 days after the injury.  WHAT ARE THE TYPES OF HEAD INJURIES? Head injuries can be as minor as a bump. Some head injuries can be more severe. More severe head injuries include:  A jarring injury to the brain (concussion).  A bruise of the brain (contusion). This mean there is bleeding in the brain that can cause swelling.  A cracked skull (skull fracture).  Bleeding in the brain that collects, clots, and forms a bump (hematoma). WHEN SHOULD I GET HELP RIGHT AWAY?   You are confused or sleepy.  You cannot be woken up.  You feel sick to your stomach (nauseous) or keep throwing up (vomiting).  Your dizziness or unsteadiness is getting worse.  You have very bad, lasting headaches that are not helped by medicine. Take medicines only as told by your doctor.  You cannot use your arms or legs like normal.  You cannot walk.  You notice changes in the black spots in the center of the colored part of your eye (pupil).  You have clear or bloody fluid coming from your nose or ears.  You have trouble seeing. During the next 24 hours after the injury, you must stay with someone who can watch you. This person should get help right away (call 911 in the [place] if you start to shake and are not able to control it (have seizures), you pass out, or you are unable to wake up. HOW CAN I PREVENT A HEAD INJURY IN THE FUTURE?  Wear seat belts.  Wear a helmet while bike riding and playing sports like football.  Stay away from dangerous activities around the house. WHEN CAN I RETURN TO NORMAL ACTIVITIES AND ATHLETICS? See your doctor before doing these activities. You should not do normal activities or play contact sports until 1 week  after the following symptoms have stopped:  Headache that does not go away.  Dizziness.  Poor attention.  Confusion.  Memory problems.  Sickness to your stomach or throwing up.  Tiredness.  Fussiness.  Bothered by bright lights or loud noises.  Anxiousness or depression.  Restless sleep. MAKE SURE YOU:   Understand these instructions.  Will watch your condition.  Will get help right away if you are not doing well or get worse. Document Released: 05/11/2008 Document Revised: 10/13/2013 Document Reviewed: 02/03/2013 [facility] Patient Information 2015 [place], [place]. This information is not intended to replace advice given to you by your health care provider. Make sure you discuss any questions you have with your health care provider.   Continue to apply ice to the affected area.  Take ibuprofen as needed for swelling and pain.  Follow up with your physician and your psychiatrist for further evaluation of your frequent falls.

## 2015-01-01 NOTE — ED Notes (Addendum)
Ice pack applied to left side of head. Patient requesting something to drink, Rob, PA said ok for patient to have something to drink, water provided to patient.

## 2015-01-01 NOTE — ED Notes (Signed)
Patient back from CT.

## 2015-01-01 NOTE — ED Notes (Signed)
Patient discharge and follow up information reviewed with patient by ED nursing staff and patient given the opportunity to ask questions pertaining to ED visit and discharge plan of care. Patient advised that should symptoms not continue to improve, resolve entirely, or should new symptoms develop then a follow up visit with their PCP or a return visit to the ED may be warranted. Patient verbalized consent and understanding of discharge plan of care including potential need for further evaluation. Patient being discharged in stable condition per attending ED physician on duty.   

## 2015-01-07 ENCOUNTER — Other Ambulatory Visit: Payer: Self-pay | Admitting: Pain Medicine

## 2015-01-11 ENCOUNTER — Ambulatory Visit
Admission: RE | Admit: 2015-01-11 | Discharge: 2015-01-11 | Disposition: A | Discharge status: Home / Self Care | Payer: BC Managed Care – PPO | Source: Ambulatory Visit | Attending: Pain Medicine | Admitting: Pain Medicine

## 2015-01-11 ENCOUNTER — Other Ambulatory Visit: Payer: Self-pay | Admitting: Pain Medicine

## 2015-01-11 ENCOUNTER — Telehealth: Payer: Self-pay | Admitting: *Deleted

## 2015-01-11 DIAGNOSIS — M47897 Other spondylosis, lumbosacral region: Secondary | ICD-10-CM | POA: Insufficient documentation

## 2015-01-11 DIAGNOSIS — M5136 Other intervertebral disc degeneration, lumbar region: Secondary | ICD-10-CM | POA: Insufficient documentation

## 2015-01-11 DIAGNOSIS — M4806 Spinal stenosis, lumbar region: Secondary | ICD-10-CM | POA: Diagnosis not present

## 2015-01-11 DIAGNOSIS — M5126 Other intervertebral disc displacement, lumbar region: Secondary | ICD-10-CM | POA: Insufficient documentation

## 2015-01-11 DIAGNOSIS — M533 Sacrococcygeal disorders, not elsewhere classified: Secondary | ICD-10-CM

## 2015-01-11 DIAGNOSIS — M79605 Pain in left leg: Secondary | ICD-10-CM | POA: Diagnosis present

## 2015-01-11 DIAGNOSIS — M545 Low back pain: Secondary | ICD-10-CM | POA: Diagnosis present

## 2015-01-11 DIAGNOSIS — M47816 Spondylosis without myelopathy or radiculopathy, lumbar region: Secondary | ICD-10-CM

## 2015-01-11 DIAGNOSIS — M79604 Pain in right leg: Secondary | ICD-10-CM | POA: Diagnosis present

## 2015-01-11 NOTE — Progress Notes (Signed)
Patient is not on any aspirin and/or anticoagulant. Please schedule patient; Dr Metta Clines aware.

## 2015-01-11 NOTE — Telephone Encounter (Signed)
Please schedule patient for procedure per Dr Letta Moynahan note. Patient is not on any aspirin and/or anticoagulant.

## 2015-01-12 ENCOUNTER — Ambulatory Visit: Payer: BC Managed Care – PPO

## 2015-01-13 NOTE — Progress Notes (Signed)
Does not take asa or similar meds

## 2015-01-18 ENCOUNTER — Ambulatory Visit: Payer: BC Managed Care – PPO | Attending: Pain Medicine | Admitting: Pain Medicine

## 2015-01-18 ENCOUNTER — Encounter: Payer: Self-pay | Admitting: Pain Medicine

## 2015-01-18 VITALS — BP 149/84 | HR 95 | Temp 98.2°F | Resp 16 | Ht 60.0 in | Wt 200.0 lb

## 2015-01-18 DIAGNOSIS — M5136 Other intervertebral disc degeneration, lumbar region: Secondary | ICD-10-CM | POA: Insufficient documentation

## 2015-01-18 DIAGNOSIS — M47816 Spondylosis without myelopathy or radiculopathy, lumbar region: Secondary | ICD-10-CM

## 2015-01-18 DIAGNOSIS — M533 Sacrococcygeal disorders, not elsewhere classified: Secondary | ICD-10-CM | POA: Diagnosis not present

## 2015-01-18 DIAGNOSIS — M5481 Occipital neuralgia: Secondary | ICD-10-CM | POA: Diagnosis not present

## 2015-01-18 DIAGNOSIS — G43909 Migraine, unspecified, not intractable, without status migrainosus: Secondary | ICD-10-CM | POA: Diagnosis not present

## 2015-01-18 DIAGNOSIS — N200 Calculus of kidney: Secondary | ICD-10-CM | POA: Diagnosis not present

## 2015-01-18 DIAGNOSIS — M545 Low back pain: Secondary | ICD-10-CM | POA: Diagnosis present

## 2015-01-18 DIAGNOSIS — M5126 Other intervertebral disc displacement, lumbar region: Secondary | ICD-10-CM | POA: Diagnosis not present

## 2015-01-18 DIAGNOSIS — M4806 Spinal stenosis, lumbar region: Secondary | ICD-10-CM | POA: Diagnosis not present

## 2015-01-18 DIAGNOSIS — M79604 Pain in right leg: Secondary | ICD-10-CM | POA: Diagnosis present

## 2015-01-18 DIAGNOSIS — M79605 Pain in left leg: Secondary | ICD-10-CM | POA: Diagnosis present

## 2015-01-18 NOTE — Progress Notes (Signed)
Safety precautions to be maintained throughout the outpatient stay will include: orient to surroundings, keep bed in low position, maintain call bell within reach at all times, provide assistance with transfer out of bed and ambulation.  Discharged ambulatory at 10:30 am

## 2015-01-18 NOTE — Patient Instructions (Addendum)
Continue present medication  F/U PCP Dr.Bliss  for evaliation of  BP and general medical  Condition  Lumbar facet, medial branch nerve, blocks to be performed Wednesday, 01/27/2015  F/U surgical evaluation  F/U neurological evaluation  May consider radiofrequency rhizolysis or intraspinal procedures pending response to present treatment and F/U evaluation   [facility] should patient have concerns prior to scheduled return appointmen.   Facet Joint Block The facet joints connect the bones of the spine (vertebrae). They make it possible for you to bend, twist, and make other movements with your spine. They also prevent you from overbending, overtwisting, and making other excessive movements.  A facet joint block is a procedure where a numbing medicine (anesthetic) is injected into a facet joint. Often, a type of anti-inflammatory medicine called a steroid is also injected. A facet joint block may be done for two reasons:   Diagnosis. A facet joint block may be done as a test to see whether neck or back pain is caused by a worn-down or infected facet joint. If the pain gets better after a facet joint block, it means the pain is probably coming from the facet joint. If the pain does not get better, it means the pain is probably not coming from the facet joint.   Therapy. A facet joint block may be done to relieve neck or back pain caused by a facet joint. A facet joint block is only done as a therapy if the pain does not improve with medicine, exercise programs, physical therapy, and other forms of pain management. LET [facility] CARE PROVIDER KNOW ABOUT:   Any allergies you have.   All medicines you are taking, including vitamins, herbs, eyedrops, and over-the-counter medicines and creams.   Previous problems you or members of your family have had with the use of anesthetics.   Any blood disorders you have had.   Other health problems you have. RISKS AND  COMPLICATIONS Generally, having a facet joint block is safe. However, as with any procedure, complications can occur. Possible complications associated with having a facet joint block include:   Bleeding.   Injury to a nerve near the injection site.   Pain at the injection site.   Weakness or numbness in areas controlled by nerves near the injection site.   Infection.   Temporary fluid retention.   Allergic reaction to anesthetics or medicines used during the procedure. BEFORE THE PROCEDURE   Follow your health care provider's instructions if you are taking dietary supplements or medicines. You may need to stop taking them or reduce your dosage.   Do not take any new dietary supplements or medicines without asking your health care provider first.   Follow your health care provider's instructions about eating and drinking before the procedure. You may need to stop eating and drinking several hours before the procedure.   Arrange to have an adult drive you home after the procedure. PROCEDURE  You may need to remove your clothing and dress in an open-back gown so that your health care provider can access your spine.   The procedure will be done while you are lying on an X-ray table. Most of the time you will be asked to lie on your stomach, but you may be asked to lie in a different position if an injection will be made in your neck.   Special machines will be used to monitor your oxygen levels, heart rate, and blood pressure.   If  an injection will be made in your neck, an intravenous (IV) tube will be inserted into one of your veins. Fluids and medicine will flow directly into your body through the IV tube.   The area over the facet joint where the injection will be made will be cleaned with an antiseptic soap. The surrounding skin will be covered with sterile drapes.   An anesthetic will be applied to your skin to make the injection area numb. You may feel a  temporary stinging or burning sensation.   A video X-ray machine will be used to locate the joint. A contrast dye may be injected into the facet joint area to help with locating the joint.   When the joint is located, an anesthetic medicine will be injected into the joint through the needle.   Your health care provider will ask you whether you feel pain relief. If you do feel relief, a steroid may be injected to provide pain relief for a longer period of time. If you do not feel relief or feel only partial relief, additional injections of an anesthetic may be made in other facet joints.   The needle will be removed, the skin will be cleansed, and bandages will be applied.  AFTER THE PROCEDURE   You will be observed for 15-30 minutes before being allowed to go home. Do not drive. Have an adult drive you or take a taxi or public transportation instead.   If you feel pain relief, the pain will return in several hours or days when the anesthetic wears off.   You may feel pain relief 2-14 days after the procedure. The amount of time this relief lasts varies from person to person.   It is normal to feel some tenderness over the injected area(s) for 2 days following the procedure.   If you have diabetes, you may have a temporary increase in blood sugar. Document Released: 10/18/2006 Document Revised: 10/13/2013 Document Reviewed: 03/18/2012 [facility] Patient Information 2015 [place], [place]. This information is not intended to replace advice given to you by your health care provider. Make sure you discuss any questions you have with your health care provider.

## 2015-01-18 NOTE — Progress Notes (Signed)
   Subjective:    Patient ID: Mariah Reeves, female    DOB: [birth year], 49 y.o.   MRN: [id number]  HPI  Patient 49 year old female [facility] evaluation and treatment of pain involving the lower back and lower extremity region predominantly patient is with pain aggravated by standing walking twisting turning maneuvers. Patient states the pain becomes more intense as patient spends more time on the feet. Patient denies any recent trauma change in events of daily living the call significant change in symptomatology. We discussed patient's condition on today's visit and we'll proceed with lumbar facet, medial branch nerve, blocks at time return appointment. On today's visit patient stated that she forgot her appointment and was unprepared to have procedure on today's visit. We rescheduled patient and we'll proceed with lumbar facet, medial branch nerve, blocks at time return appointment on Wednesday, 01/27/2015. Patient was with understanding and was in agreement with suggested treatment plan  Review of Systems     Objective:   Physical Exam  There was tenderness over the splenius capitis and occipitalis musculature region of mild degree. Mild tenderness of the acromioclavicular glenohumeral joint region. Patient was with bilaterally equal grip strength. Palpation over the thoracic facet thoracic paraspinal musculature region and the cervical facet cervical paraspinal musculature region reproduced mild to moderate discomfort. There was moderate discomfort in the lower thoracic paraspinal musculature region with evidence of muscle spasms of moderate degree. There was no crepitus of the thoracic region noted. Patient appeared to be with unremarkable Spurling's maneuver. Palpation over the lumbar paraspinal musculature region lumbar facet region was with moderate to moderately severe discomfort. Lateral bending and rotation and extension and palpation of the lumbar facets  reproduce moderate to moderately severe discomfort. There was mild tinnitus of the greater trochanteric region iliotibial band region. There was moderate tends over the PSIS and PII S region as well as the gluteal and piriformis musculature regions. No definite sensory deficit of dermatomal distribution detected. Lateral bending and rotation and extension and palpation of the lumbar facets reproduce predominant portion of patient's pain. There was negative clonus negative Homans. Abdomen was nontender with no costovertebral angle tenderness noted.      Assessment & Plan:   Degenerative disc disease lumbar spine Left-sided disc protrusion L1-2. Disc and facet degenerative changes L4-5 causing moderate spinal stenosis. L5-S1 spondylosis with foraminal stenosis bilaterally. L3 for facet degenerative changes  Lumbar facet syndrome  Sacroiliac joint dysfunction  Bilateral occipital neuralgia  Migraine  Nephrolithiasis     Plan    Continue present medications  Lumbar facet, medial branch nerve, blocks to be performed at time return appointment  F/U PCP Dr. Quillian Quince for evaliation of  BP and general medical  condition  F/U surgical evaluation has been addressed.  F/U neurological evaluation  May consider radiofrequency rhizolysis or intraspinal procedures pending response to present treatment and F/U evaluation   Patient to call [facility] should patient have concerns prior to scheduled return appointmen.

## 2015-01-21 ENCOUNTER — Ambulatory Visit
Admission: RE | Admit: 2015-01-21 | Discharge: 2015-01-21 | Disposition: A | Discharge status: Home / Self Care | Payer: BC Managed Care – PPO | Source: Ambulatory Visit | Attending: Family Medicine | Admitting: Family Medicine

## 2015-01-21 DIAGNOSIS — Z1231 Encounter for screening mammogram for malignant neoplasm of breast: Secondary | ICD-10-CM | POA: Insufficient documentation

## 2015-01-22 ENCOUNTER — Other Ambulatory Visit: Payer: Self-pay | Admitting: Family Medicine

## 2015-01-22 DIAGNOSIS — N6489 Other specified disorders of breast: Secondary | ICD-10-CM

## 2015-01-22 DIAGNOSIS — R928 Other abnormal and inconclusive findings on diagnostic imaging of breast: Secondary | ICD-10-CM

## 2015-01-26 ENCOUNTER — Ambulatory Visit: Payer: BC Managed Care – PPO

## 2015-01-27 ENCOUNTER — Ambulatory Visit: Payer: BC Managed Care – PPO | Attending: Pain Medicine | Admitting: Pain Medicine

## 2015-01-27 VITALS — BP 114/70 | HR 89 | Temp 98.1°F | Resp 18 | Ht 60.0 in | Wt 210.0 lb

## 2015-01-27 DIAGNOSIS — M4806 Spinal stenosis, lumbar region: Secondary | ICD-10-CM | POA: Insufficient documentation

## 2015-01-27 DIAGNOSIS — M79604 Pain in right leg: Secondary | ICD-10-CM | POA: Diagnosis present

## 2015-01-27 DIAGNOSIS — M47816 Spondylosis without myelopathy or radiculopathy, lumbar region: Secondary | ICD-10-CM | POA: Insufficient documentation

## 2015-01-27 DIAGNOSIS — M5126 Other intervertebral disc displacement, lumbar region: Secondary | ICD-10-CM | POA: Insufficient documentation

## 2015-01-27 DIAGNOSIS — M533 Sacrococcygeal disorders, not elsewhere classified: Secondary | ICD-10-CM

## 2015-01-27 DIAGNOSIS — M79605 Pain in left leg: Secondary | ICD-10-CM | POA: Diagnosis present

## 2015-01-27 DIAGNOSIS — M545 Low back pain: Secondary | ICD-10-CM | POA: Diagnosis present

## 2015-01-27 DIAGNOSIS — M5136 Other intervertebral disc degeneration, lumbar region: Secondary | ICD-10-CM

## 2015-01-27 MED ORDER — ORPHENADRINE CITRATE 30 MG/ML IJ SOLN
INTRAMUSCULAR | Status: AC
  Filled 2015-01-27: qty 2

## 2015-01-27 MED ORDER — TRIAMCINOLONE ACETONIDE 40 MG/ML IJ SUSP
INTRAMUSCULAR | Status: AC
  Administered 2015-01-27: 13:00:00
  Filled 2015-01-27: qty 1

## 2015-01-27 MED ORDER — MIDAZOLAM HCL 5 MG/5ML IJ SOLN
INTRAMUSCULAR | Status: AC
  Administered 2015-01-27: 5 mg via INTRAVENOUS
  Filled 2015-01-27: qty 5

## 2015-01-27 MED ORDER — CEFUROXIME AXETIL 250 MG PO TABS: 250.0000 mg | ORAL_TABLET | Freq: Two times a day (BID) | ORAL | Status: DC

## 2015-01-27 MED ORDER — BUPIVACAINE HCL (PF) 0.25 % IJ SOLN
INTRAMUSCULAR | Status: AC
  Administered 2015-01-27: 20 mL
  Filled 2015-01-27: qty 30

## 2015-01-27 MED ORDER — CEFAZOLIN SODIUM 1 G IJ SOLR
INTRAMUSCULAR | Status: AC
  Administered 2015-01-27: 1 g via INTRAVENOUS
  Filled 2015-01-27: qty 10

## 2015-01-27 MED ORDER — FENTANYL CITRATE (PF) 100 MCG/2ML IJ SOLN
INTRAMUSCULAR | Status: AC
  Administered 2015-01-27: 100 ug via INTRAVENOUS
  Filled 2015-01-27: qty 2

## 2015-01-27 NOTE — Progress Notes (Signed)
Subjective:    Patient ID: Mariah Reeves, female    DOB: Mar 07, 1966, 49 y.o.   MRN: [id number]  HPI  PROCEDURE PERFORMED: Lumbar facet (medial branch block)   NOTE: The patient is a 49 y.o. female who [facility] for further evaluation and treatment of pain involving the lumbar and lower extremity region.  MRI  revealed the patient to be with evidence of  Left-sided disc protrusion L1-2 without significant neural impingement period disc and facet degenerative changes L4-5 causing moderate spinal stenosis. L5-S1 spondylosis with mild foraminal stenosis bilaterally.. There is concern regarding  There is significant component of patient's pain is due to lumbar facet syndrome The risks, benefits, and expectations of the procedure have been discussed and explained to the patient who was understanding and in agreement with suggested treatment plan. We will proceed with interventional treatment as discussed and as explained to the patient who was understanding and wished to proceed with procedure as planned.   DESCRIPTION OF PROCEDURE: Lumbar facet (medial branch block) with IV Versed, IV fentanyl conscious sedation, EKG, blood pressure, pulse, and pulse oximetry monitoring. The procedure was performed with the patient in the prone position. Betadine prep of proposed entry site performed.   NEEDLE PLACEMENT AT:  left L  3 lumbar facet (medial branch block). Under fluoroscopic guidance with oblique orientation of 15 degrees, a 22-gauge needle was inserted at the L  3 vertebral body level with needle placed at the targeted area of Burton's Eye or Eye of the Scotty Dog with documentation of needle placement in the superior and lateral border of targeted area of Burton's Eye or Eye of the Scotty Dog with oblique orientation of 15 degrees. Following documentation of needle placement at the L  3 vertebral body level, needle placement was then accomplished at the L  4 vertebral body level.    NEEDLE PLACEMENT AT  L4 and L5 VERTEBRAL BODY LEVELS ON THE LEFT SIDE The procedure was performed at the  L4 and L5 vertebral body levels exactly as was performed at the L  3 vertebral body level utilizing the same technique and under fluoroscopic guidance.  NEEDLE PLACEMENT AT THE SACRAL ALA with AP view of the lumbosacral spine. With the patient in the prone position, Betadine prep of proposed entry site accomplished, a 22 gauge needle was inserted in the region of the sacral ala (groove formed by the superior articulating process of S1 and the sacral wing). Following documentation of needle placement at the sacral ala,  needle placement was then accomplished at the S1 foramen level.   NEEDLE PLACEMENT AT THE S1 FORAMEN LEVEL under fluoroscopic guidance with AP view of the lumbosacral spine and cephalad orientation of the fluoroscope, a 22-gauge needle was placed at the superior and lateral border of the S1 foramen under fluoroscopic guidance. Following documentation of needle placement at the S1 foramen.   Needle placement was then verified at all levels on lateral view. Following documentation of needle placement at all levels on lateral view and following negative aspiration for heme and CSF, each level was injected with 1 mL of 0.25% bupivacaine with Kenalog.     LUMBAR FACET, MEDIAL BRANCH NERVE, BLOCKS PERFORMED ON THE RIGHT SIDE   The procedure was performed on the right side exactly as was performed on the left side at the same levels and utilizing the same technique under fluoroscopic guidance.     The patient tolerated the procedure well. A total of 40 mg  of Kenalog was utilized for the procedure.   PLAN:  1. Medications: The patient will continue presently prescribed medications. 2. May consider modification of treatment regimen at time of return appointment pending response to treatment rendered on today's visit. 3. The patient is to follow-up with primary care physician  Dr. Quillian Quince for further evaluation of blood pressure and general medical condition status post steroid injection performed on today's visit. 4. Surgical follow-up evaluation has been addressed. 5. Neurological follow-up evaluation. PNCV/EMG studies may be considered as well. 6. The patient may be candidate for radiofrequency procedures, implantation type procedures, and other treatment pending response to treatment and follow-up evaluation. 7. The patient has been advised to call the [facility] prior to scheduled return appointment should there be significant change in condition or should patient have other concerns regarding condition prior to scheduled return appointment.  The patient is understanding and in agreement with suggested treatment plan.     Review of Systems     Objective:   Physical Exam        Assessment & Plan:

## 2015-01-27 NOTE — Progress Notes (Signed)
Safety precautions to be maintained throughout the outpatient stay will include: orient to surroundings, keep bed in low position, maintain call bell within reach at all times, provide assistance with transfer out of bed and ambulation.  

## 2015-01-27 NOTE — Patient Instructions (Addendum)
Continue present medications and begin taking antibiotic Ceftin as prescribed. Please obtain your antibiotic Ceftin today and begin taking antibiotic today  F/U PCP Dr. Quillian Quince for evaliation of  BP and general medical  condition.  F/U surgical evaluation as discussed  F/U neurological evaluation.  May consider radiofrequency rhizolysis or intraspinal procedures pending response to present treatment and F/U evaluation.  Patient to call Pain Management Center should patient have concerns prior to scheduled return appointment.  Pain Management Discharge Instructions  General Discharge Instructions :  If you need to reach your doctor call: Monday-Friday 8:00 am - 4:00 pm at [phone] or toll free [phone].  After clinic hours [phone] to have operator reach doctor.  Bring all of your medication bottles to all your appointments in the pain clinic.  To cancel or reschedule your appointment with Pain Management please remember to call 24 hours in advance to avoid a fee.  Refer to the educational materials which you have been given on: General Risks, I had my Procedure. Discharge Instructions, Post Sedation.  Post Procedure Instructions:  The drugs you were given will stay in your system until tomorrow, so for the next 24 hours you should not drive, make any legal decisions or drink any alcoholic beverages.  You may eat anything you prefer, but it is better to start with liquids then soups and crackers, and gradually work up to solid foods.  Please notify your doctor immediately if you have any unusual bleeding, trouble breathing or pain that is not related to your normal pain.  Depending on the type of procedure that was done, some parts of your body may feel week and/or numb.  This usually clears up by tonight or the next day.  Walk with the use of an assistive device or accompanied by an adult for the 24 hours.  You may use ice on the affected area for the first 24 hours.   Put ice in a Ziploc bag and cover with a towel and place against area 15 minutes on 15 minutes off.  You may switch to heat after 24 hours.GENERAL RISKS AND COMPLICATIONS  What are the risk, side effects and possible complications? Generally speaking, most procedures are safe.  However, with any procedure there are risks, side effects, and the possibility of complications.  The risks and complications are dependent upon the sites that are lesioned, or the type of nerve block to be performed.  The closer the procedure is to the spine, the more serious the risks are.  Great care is taken when placing the radio frequency needles, block needles or lesioning probes, but sometimes complications can occur. 1. Infection: Any time there is an injection through the skin, there is a risk of infection.  This is why sterile conditions are used for these blocks.  There are four possible types of infection. 1. Localized skin infection. 2. Central Nervous System Infection-This can be in the form of Meningitis, which can be deadly. 3. Epidural Infections-This can be in the form of an epidural abscess, which can cause pressure inside of the spine, causing compression of the spinal cord with subsequent paralysis. This would require an emergency surgery to decompress, and there are no guarantees that the patient would recover from the paralysis. 4. Discitis-This is an infection of the intervertebral discs.  It occurs in about 1% of discography procedures.  It is difficult to treat and it may lead to surgery.        2. Pain: the needles have to  go through skin and soft tissues, will cause soreness.       3. Damage to internal structures:  The nerves to be lesioned may be near blood vessels or    other nerves which can be potentially damaged.       4. Bleeding: Bleeding is more common if the patient is taking blood thinners such as  aspirin, Coumadin, Ticiid, Plavix, etc., or if he/she have some genetic predisposition  such  as hemophilia. Bleeding into the spinal canal can cause compression of the spinal  cord with subsequent paralysis.  This would require an emergency surgery to  decompress and there are no guarantees that the patient would recover from the  paralysis.       5. Pneumothorax:  Puncturing of a lung is a possibility, every time a needle is introduced in  the area of the chest or upper back.  Pneumothorax refers to free air around the  collapsed lung(s), inside of the thoracic cavity (chest cavity).  Another two possible  complications related to a similar event would include: Hemothorax and Chylothorax.   These are variations of the Pneumothorax, where instead of air around the collapsed  lung(s), you may have blood or chyle, respectively.       6. Spinal headaches: They may occur with any procedures in the area of the spine.       7. Persistent CSF (Cerebro-Spinal Fluid) leakage: This is a rare problem, but may occur  with prolonged intrathecal or epidural catheters either due to the formation of a fistulous  track or a dural tear.       8. Nerve damage: By working so close to the spinal cord, there is always a possibility of  nerve damage, which could be as serious as a permanent spinal cord injury with  paralysis.       9. Death:  Although rare, severe deadly allergic reactions known as "Anaphylactic  reaction" can occur to any of the medications used.      10. Worsening of the symptoms:  We can always make thing worse.  What are the chances of something like this happening? Chances of any of this occuring are extremely low.  By statistics, you have more of a chance of getting killed in a motor vehicle accident: while driving to the hospital than any of the above occurring .  Nevertheless, you should be aware that they are possibilities.  In general, it is similar to taking a shower.  Everybody knows that you can slip, hit your head and get killed.  Does that mean that you should not shower again?   Nevertheless always keep in mind that statistics do not mean anything if you happen to be on the wrong side of them.  Even if a procedure has a 1 (one) in a 1,000,000 (million) chance of going wrong, it you happen to be that one..Also, keep in mind that by statistics, you have more of a chance of having something go wrong when taking medications.  Who should not have this procedure? If you are on a blood thinning medication (e.g. Coumadin, Plavix, see list of "Blood Thinners"), or if you have an active infection going on, you should not have the procedure.  If you are taking any blood thinners, please inform your physician.  How should I prepare for this procedure?  Do not eat or drink anything at least six hours prior to the procedure.  Bring a driver with you .  It cannot  be a taxi.  Come accompanied by an adult that can drive you back, and that is strong enough to help you if your legs get weak or numb from the local anesthetic.  Take all of your medicines the morning of the procedure with just enough water to swallow them.  If you have diabetes, make sure that you are scheduled to have your procedure done first thing in the morning, whenever possible.  If you have diabetes, take only half of your insulin dose and notify our nurse that you have done so as soon as you arrive at the clinic.  If you are diabetic, but only take blood sugar pills (oral hypoglycemic), then do not take them on the morning of your procedure.  You may take them after you have had the procedure.  Do not take aspirin or any aspirin-containing medications, at least eleven (11) days prior to the procedure.  They may prolong bleeding.  Wear loose fitting clothing that may be easy to take off and that you would not mind if it got stained with Betadine or blood.  Do not wear any jewelry or perfume  Remove any nail coloring.  It will interfere with some of our monitoring equipment.  NOTE: Remember that this is not  meant to be interpreted as a complete list of all possible complications.  Unforeseen problems may occur.  BLOOD THINNERS The following drugs contain aspirin or other products, which can cause increased bleeding during surgery and should not be taken for 2 weeks prior to and 1 week after surgery.  If you should need take something for relief of minor pain, you may take acetaminophen which is found in Tylenol,m Datril, Anacin-3 and Panadol. It is not blood thinner. The products listed below are.  Do not take any of the products listed below in addition to any listed on your instruction sheet.  A.P.C or A.P.C with Codeine Codeine Phosphate Capsules #3 Ibuprofen Ridaura  ABC compound Congesprin Imuran rimadil  Advil Cope Indocin Robaxisal  Alka-Seltzer Effervescent Pain Reliever and Antacid Coricidin or Coricidin-D  Indomethacin Rufen  Alka-Seltzer plus Cold Medicine Cosprin Ketoprofen S-A-C Tablets  Anacin Analgesic Tablets or Capsules Coumadin Korlgesic Salflex  Anacin Extra Strength Analgesic tablets or capsules CP-2 Tablets Lanoril Salicylate  Anaprox Cuprimine Capsules Levenox Salocol  Anexsia-D Dalteparin Magan Salsalate  Anodynos Darvon compound Magnesium Salicylate Sine-off  Ansaid Dasin Capsules Magsal Sodium Salicylate  Anturane Depen Capsules Marnal Soma  APF Arthritis pain formula Dewitt's Pills Measurin Stanback  Argesic Dia-Gesic Meclofenamic Sulfinpyrazone  Arthritis Bayer Timed Release Aspirin Diclofenac Meclomen Sulindac  Arthritis pain formula Anacin Dicumarol Medipren Supac  Analgesic (Safety coated) Arthralgen Diffunasal Mefanamic Suprofen  Arthritis Strength Bufferin Dihydrocodeine Mepro Compound Suprol  Arthropan liquid Dopirydamole Methcarbomol with Aspirin Synalgos  ASA tablets/Enseals Disalcid Micrainin Tagament  Ascriptin Doan's Midol Talwin  Ascriptin A/D Dolene Mobidin Tanderil  Ascriptin Extra Strength Dolobid Moblgesic Ticlid  Ascriptin with Codeine Doloprin or  Doloprin with Codeine Momentum Tolectin  Asperbuf Duoprin Mono-gesic Trendar  Aspergum Duradyne Motrin or Motrin IB Triminicin  Aspirin plain, buffered or enteric coated Durasal Myochrisine Trigesic  Aspirin Suppositories Easprin Nalfon Trillsate  Aspirin with Codeine Ecotrin Regular or Extra Strength Naprosyn Uracel  Atromid-S Efficin Naproxen Ursinus  Auranofin Capsules Elmiron Neocylate Vanquish  Axotal Emagrin Norgesic Verin  Azathioprine Empirin or Empirin with Codeine Normiflo Vitamin E  Azolid Emprazil Nuprin Voltaren  Bayer Aspirin plain, buffered or children's or timed BC Tablets or powders Encaprin Orgaran Warfarin Sodium  Buff-a-Comp  Enoxaparin Orudis Zorpin  Buff-a-Comp with Codeine Equegesic Os-Cal-Gesic   Buffaprin Excedrin plain, buffered or Extra Strength Oxalid   Bufferin Arthritis Strength Feldene Oxphenbutazone   Bufferin plain or Extra Strength Feldene Capsules Oxycodone with Aspirin   Bufferin with Codeine Fenoprofen Fenoprofen Pabalate or Pabalate-SF   Buffets II Flogesic Panagesic   Buffinol plain or Extra Strength Florinal or Florinal with Codeine Panwarfarin   Buf-Tabs Flurbiprofen Penicillamine   Butalbital Compound Four-way cold tablets Penicillin   Butazolidin Fragmin Pepto-Bismol   Carbenicillin Geminisyn Percodan   Carna Arthritis Reliever Geopen Persantine   Carprofen Gold's salt Persistin   Chloramphenicol Goody's Phenylbutazone   Chloromycetin Haltrain Piroxlcam   Clmetidine heparin Plaquenil   Cllnoril Hyco-pap Ponstel   Clofibrate Hydroxy chloroquine Propoxyphen         Before stopping any of these medications, be sure to consult the physician who ordered them.  Some, such as Coumadin (Warfarin) are ordered to prevent or treat serious conditions such as "deep thrombosis", "pumonary embolisms", and other heart problems.  The amount of time that you may need off of the medication may also vary with the medication and the reason for which you were  taking it.  If you are taking any of these medications, please make sure you notify your pain physician before you undergo any procedures.

## 2015-01-28 ENCOUNTER — Telehealth: Payer: Self-pay | Admitting: *Deleted

## 2015-01-28 NOTE — Telephone Encounter (Signed)
Mailbox is full.

## 2015-02-01 ENCOUNTER — Ambulatory Visit
Admission: RE | Admit: 2015-02-01 | Discharge: 2015-02-01 | Disposition: A | Discharge status: Home / Self Care | Payer: BC Managed Care – PPO | Source: Ambulatory Visit | Attending: Family Medicine | Admitting: Family Medicine

## 2015-02-01 DIAGNOSIS — E042 Nontoxic multinodular goiter: Secondary | ICD-10-CM

## 2015-02-01 DIAGNOSIS — E041 Nontoxic single thyroid nodule: Secondary | ICD-10-CM | POA: Insufficient documentation

## 2015-02-05 ENCOUNTER — Ambulatory Visit
Admission: RE | Admit: 2015-02-05 | Discharge: 2015-02-05 | Disposition: A | Discharge status: Home / Self Care | Payer: BC Managed Care – PPO | Source: Ambulatory Visit | Attending: Family Medicine | Admitting: Family Medicine

## 2015-02-05 DIAGNOSIS — R928 Other abnormal and inconclusive findings on diagnostic imaging of breast: Secondary | ICD-10-CM | POA: Diagnosis not present

## 2015-02-05 DIAGNOSIS — N6489 Other specified disorders of breast: Secondary | ICD-10-CM

## 2015-03-02 ENCOUNTER — Ambulatory Visit: Payer: BC Managed Care – PPO | Attending: Pain Medicine | Admitting: Pain Medicine

## 2015-03-02 ENCOUNTER — Encounter: Payer: Self-pay | Admitting: Pain Medicine

## 2015-03-02 VITALS — BP 128/96 | HR 104 | Temp 97.7°F | Resp 16 | Ht 60.0 in | Wt 196.0 lb

## 2015-03-02 DIAGNOSIS — G43909 Migraine, unspecified, not intractable, without status migrainosus: Secondary | ICD-10-CM | POA: Insufficient documentation

## 2015-03-02 DIAGNOSIS — M533 Sacrococcygeal disorders, not elsewhere classified: Secondary | ICD-10-CM | POA: Diagnosis not present

## 2015-03-02 DIAGNOSIS — M79604 Pain in right leg: Secondary | ICD-10-CM | POA: Diagnosis present

## 2015-03-02 DIAGNOSIS — M5136 Other intervertebral disc degeneration, lumbar region: Secondary | ICD-10-CM | POA: Diagnosis not present

## 2015-03-02 DIAGNOSIS — M545 Low back pain: Secondary | ICD-10-CM | POA: Diagnosis present

## 2015-03-02 DIAGNOSIS — M47816 Spondylosis without myelopathy or radiculopathy, lumbar region: Secondary | ICD-10-CM

## 2015-03-02 DIAGNOSIS — M79605 Pain in left leg: Secondary | ICD-10-CM | POA: Diagnosis present

## 2015-03-02 DIAGNOSIS — M5481 Occipital neuralgia: Secondary | ICD-10-CM | POA: Diagnosis not present

## 2015-03-02 MED ORDER — HYDROCODONE-ACETAMINOPHEN 10-325 MG PO TABS: ORAL_TABLET | ORAL | Status: DC

## 2015-03-02 NOTE — Patient Instructions (Addendum)
PLAN   Continue present medication. Began hydrocodone acetaminophen as discussed and caution the medication can cause respiratory depression confusion excessive sedation and other side effects. Exercise extreme caution when taking the medication   Lumbar facet, medial branch nerve, blocks to be performed at time return appointment  F/U PCP  Dr.Bliss  for evaliation of  BP and general medical  condition  F/U surgical evaluation. May consider pending follow-up evaluations  F/U neurological evaluation. May consider pending follow-up evaluations  May consider radiofrequency rhizolysis or intraspinal procedures pending response to present treatment and F/U evaluation   Patient to call Pain Management Center should patient have concerns prior to scheduled return appointment. Pain Management Discharge Instructions  General Discharge Instructions :  If you need to reach your doctor call: Monday-Friday 8:00 am - 4:00 pm at [phone] or toll free [phone].  After clinic hours [phone] to have operator reach doctor.  Bring all of your medication bottles to all your appointments in the pain clinic.  To cancel or reschedule your appointment with Pain Management please remember to call 24 hours in advance to avoid a fee.  Refer to the educational materials which you have been given on: General Risks, I had my Procedure. Discharge Instructions, Post Sedation.  Post Procedure Instructions:  The drugs you were given will stay in your system until tomorrow, so for the next 24 hours you should not drive, make any legal decisions or drink any alcoholic beverages.  You may eat anything you prefer, but it is better to start with liquids then soups and crackers, and gradually work up to solid foods.  Please notify your doctor immediately if you have any unusual bleeding, trouble breathing or pain that is not related to your normal pain.  Depending on the type of procedure that was done,  some parts of your body may feel week and/or numb.  This usually clears up by tonight or the next day.  Walk with the use of an assistive device or accompanied by an adult for the 24 hours.  You may use ice on the affected area for the first 24 hours.  Put ice in a Ziploc bag and cover with a towel and place against area 15 minutes on 15 minutes off.  You may switch to heat after 24 hours.Facet Blocks Patient Information  Description: The facets are joints in the spine between the vertebrae.  Like any joints in the body, facets can become irritated and painful.  Arthritis can also effect the facets.  By injecting steroids and local anesthetic in and around these joints, we can temporarily block the nerve supply to them.  Steroids act directly on irritated nerves and tissues to reduce selling and inflammation which often leads to decreased pain.  Facet blocks may be done anywhere along the spine from the neck to the low back depending upon the location of your pain.   After numbing the skin with local anesthetic (like Novocaine), a small needle is passed onto the facet joints under x-ray guidance.  You may experience a sensation of pressure while this is being done.  The entire block usually lasts about 15-25 minutes.   Conditions which may be treated by facet blocks:   Low back/buttock pain  Neck/shoulder pain  Certain types of headaches  Preparation for the injection:  1. Do not eat any solid food or dairy products within 6 hours of your appointment. 2. You may drink clear liquid up to 2 hours before appointment.  Clear liquids  include water, black coffee, juice or soda.  No milk or cream please. 3. You may take your regular medication, including pain medications, with a sip of water before your appointment.  Diabetics should hold regular insulin (if taken separately) and take 1/2 normal NPH dose the morning of the procedure.  Carry some sugar containing items with you to your  appointment. 4. A driver must accompany you and be prepared to drive you home after your procedure. 5. Bring all your current medications with you. 6. An IV may be inserted and sedation may be given at the discretion of the physician. 7. A blood pressure cuff, EKG and other monitors will often be applied during the procedure.  Some patients may need to have extra oxygen administered for a short period. 8. You will be asked to provide medical information, including your allergies and medications, prior to the procedure.  We must know immediately if you are taking blood thinners (like Coumadin/Warfarin) or if you are allergic to IV iodine contrast (dye).  We must know if you could possible be pregnant.  Possible side-effects:   Bleeding from needle site  Infection (rare, may require surgery)  Nerve injury (rare)  Numbness & tingling (temporary)  Difficulty urinating (rare, temporary)  Spinal headache (a headache worse with upright posture)  Light-headedness (temporary)  Pain at injection site (serveral days)  Decreased blood pressure (rare, temporary)  Weakness in arm/leg (temporary)  Pressure sensation in back/neck (temporary)   Call if you experience:   Fever/chills associated with headache or increased back/neck pain  Headache worsened by an upright position  New onset, weakness or numbness of an extremity below the injection site  Hives or difficulty breathing (go to the emergency room)  Inflammation or drainage at the injection site(s)  Severe back/neck pain greater than usual  New symptoms which are concerning to you  Please note:  Although the local anesthetic injected can often make your back or neck feel good for several hours after the injection, the pain will likely return. It takes 3-7 days for steroids to work.  You may not notice any pain relief for at least one week.  If effective, we will often do a series of 2-3 injections spaced 3-6 weeks apart to  maximally decrease your pain.  After the initial series, you may be a candidate for a more permanent nerve block of the facets.  If you have any questions, please call [phone] [facility]

## 2015-03-02 NOTE — Progress Notes (Signed)
   Subjective:    Patient ID: Mariah Reeves, female    DOB: [birth year], 49 y.o.   MRN: [id number]  HPI  Patient 49 year old female returns to pain management for further evaluation and treatment of pain involving the lower back and lower extremity region predominantly patient had within 50% relief of pain following previous lumbar facet, medial branch nerve, blocks. Patient states sheof return of pain at this time. Patient states the pain becomes more intense as patient spends time on the feet. Patient denies any trauma change in events of daily living the call significant change in symptomatology. Patient states that the pain is aggravated by twisting turning maneuvers. Patient also has difficulty turning over in bed due to severe degree of the lumbar pain. We discussed patient's condition and will proceed with lumbar facet, medial branch nerve, blocks at time return appointment in attempt to decrease severity of symptoms, minimize progression of symptoms, and avoid need for more involved treatment. The patient was in agreement with suggested treatment plan.     Review of Systems     Objective:   Physical Exam  There was mild tenderness of the spleen scapula and occipitalis musculature region. Palpation of the region of the acromioclavicular glenohumeral joint regions reproduced pain of mild degree. Patient appeared to be with no significant increase of pain with Tinel and Phalen's maneuver. Patient appeared to be with bilaterally equal grip strength. Palpation of the cervical facet cervical paraspinal musculature region reproduced pain of mild degree. There was mild tinnitus of the thoracic facet thoracic paraspinal musculature region with no crepitus of the thoracic region noted. Palpation over the lumbar paraspinal musculature region lumbar facet region associated with moderately severe discomfort. Lateral bending rotation and extension and palpation of the lumbar facets reproduce moderate to  moderately severe discomfort. Straight leg raising was tolerates approximately 20 without a definite increase of pain with dorsiflexion noted. There was negative clonus negative Homans. DTRs were difficult to elicit patient had difficulty relaxing.. There was moderate tenderness over the PSIS and PII S regions. There was moderate tends to palpation of the greater trochanteric region and iliotibial band region. There was negative clonus negative Homans. Abdomen was nontender with no costovertebral angle tenderness noted.    Assessment & Plan:    Degenerative disc disease lumbar spine  Lumbar facet syndrome  Sacroiliac joint dysfunction  Bilateral occipital neuralgia  Migraine    PLAN   Continue present medication  The patient was provided prescription for hydrocodone acetaminophen on today's visit. Patient was cautioned  regarding respiratory depression confusion and other side effects which can occur with this medication. Patient expressed understanding and willingness to comply with recommendations to avoid undesirable side effects  Lumbar facet, medial branch nerve, blocks to be performed at time return appointment  F/U PCP Dr. Quillian Quince for evaliation of  BP and general medical  condition  F/U surgical evaluation. May consider pending follow-up evaluations  F/U neurological evaluation. May consider pending follow-up evaluations  May consider radiofrequency rhizolysis or intraspinal procedures pending response to present treatment and F/U evaluation   Patient to call Pain Management Center should patient have concerns prior to scheduled return appointment.   Nephrolithiasis

## 2015-03-02 NOTE — Progress Notes (Signed)
Patient had mammogram and there was a abnormality identified.  She then went for an [place] and will return in 6 months for repeat mammogram.

## 2015-03-15 ENCOUNTER — Telehealth: Payer: Self-pay | Admitting: Pain Medicine

## 2015-03-15 NOTE — Telephone Encounter (Signed)
Needs to speak with someone regarding plan on how she is going to get here and return home after procedure / please call

## 2015-03-15 NOTE — Telephone Encounter (Signed)
Patient was called; she is going to drive to the clinic and have a friend come to the clinic and take her home and come back the next day to pick up her car.

## 2015-03-16 ENCOUNTER — Emergency Department
Admission: EM | Admit: 2015-03-16 | Discharge: 2015-03-16 | Discharge status: Against Medical Advice | Payer: BC Managed Care – PPO | Attending: Emergency Medicine | Admitting: Emergency Medicine

## 2015-03-16 DIAGNOSIS — Y9289 Other specified places as the place of occurrence of the external cause: Secondary | ICD-10-CM | POA: Insufficient documentation

## 2015-03-16 DIAGNOSIS — T426X1A Poisoning by other antiepileptic and sedative-hypnotic drugs, accidental (unintentional), initial encounter: Secondary | ICD-10-CM | POA: Diagnosis not present

## 2015-03-16 DIAGNOSIS — Y9389 Activity, other specified: Secondary | ICD-10-CM | POA: Diagnosis not present

## 2015-03-16 DIAGNOSIS — Y998 Other external cause status: Secondary | ICD-10-CM | POA: Insufficient documentation

## 2015-03-16 NOTE — ED Notes (Signed)
Spoke with Dr Derrill Kay regarding pt; no orders given at this time

## 2015-03-16 NOTE — ED Notes (Signed)
Pt to triage via w/c with no distress noted; pt reports taking lamictal  instead of rx ; st took  at midnight then again at 2am because she forgot she had taken it; pt denies c/o at present

## 2015-03-17 ENCOUNTER — Telehealth: Payer: Self-pay | Admitting: Emergency Medicine

## 2015-03-17 ENCOUNTER — Ambulatory Visit: Payer: BC Managed Care – PPO | Admitting: Pain Medicine

## 2015-03-31 ENCOUNTER — Ambulatory Visit: Payer: BC Managed Care – PPO | Attending: Pain Medicine | Admitting: Pain Medicine

## 2015-03-31 ENCOUNTER — Ambulatory Visit: Payer: BC Managed Care – PPO | Admitting: Pain Medicine

## 2015-03-31 ENCOUNTER — Encounter: Payer: Self-pay | Admitting: Pain Medicine

## 2015-03-31 VITALS — BP 133/66 | HR 79 | Temp 97.7°F | Resp 16 | Ht 60.0 in | Wt 205.0 lb

## 2015-03-31 DIAGNOSIS — M51369 Other intervertebral disc degeneration, lumbar region without mention of lumbar back pain or lower extremity pain: Secondary | ICD-10-CM

## 2015-03-31 DIAGNOSIS — M545 Low back pain: Secondary | ICD-10-CM | POA: Diagnosis present

## 2015-03-31 DIAGNOSIS — M5481 Occipital neuralgia: Secondary | ICD-10-CM

## 2015-03-31 DIAGNOSIS — M533 Sacrococcygeal disorders, not elsewhere classified: Secondary | ICD-10-CM

## 2015-03-31 DIAGNOSIS — M5136 Other intervertebral disc degeneration, lumbar region: Secondary | ICD-10-CM | POA: Diagnosis not present

## 2015-03-31 DIAGNOSIS — G43909 Migraine, unspecified, not intractable, without status migrainosus: Secondary | ICD-10-CM | POA: Insufficient documentation

## 2015-03-31 DIAGNOSIS — M47816 Spondylosis without myelopathy or radiculopathy, lumbar region: Secondary | ICD-10-CM

## 2015-03-31 DIAGNOSIS — M79604 Pain in right leg: Secondary | ICD-10-CM | POA: Diagnosis present

## 2015-03-31 DIAGNOSIS — M79605 Pain in left leg: Secondary | ICD-10-CM | POA: Diagnosis present

## 2015-03-31 MED ORDER — ORPHENADRINE CITRATE 30 MG/ML IJ SOLN: 60.0000 mg | Freq: Once | INTRAMUSCULAR | Status: DC

## 2015-03-31 MED ORDER — FENTANYL CITRATE (PF) 100 MCG/2ML IJ SOLN: 100.0000 ug | Freq: Once | INTRAMUSCULAR | Status: DC

## 2015-03-31 MED ORDER — CEFUROXIME AXETIL 250 MG PO TABS: 250.0000 mg | ORAL_TABLET | Freq: Two times a day (BID) | ORAL | Status: DC

## 2015-03-31 MED ORDER — LIDOCAINE HCL (PF) 1 % IJ SOLN: 10.0000 mL | Freq: Once | INTRAMUSCULAR | Status: DC

## 2015-03-31 MED ORDER — MIDAZOLAM HCL 5 MG/5ML IJ SOLN: 5.0000 mg | Freq: Once | INTRAMUSCULAR | Status: DC

## 2015-03-31 MED ORDER — TRIAMCINOLONE ACETONIDE 40 MG/ML IJ SUSP: 40.0000 mg | Freq: Once | INTRAMUSCULAR | Status: DC

## 2015-03-31 MED ORDER — LACTATED RINGERS IV SOLN: 1000.0000 mL | INTRAVENOUS | Status: DC

## 2015-03-31 MED ORDER — CEFAZOLIN SODIUM 1-5 GM-% IV SOLN: 1.0000 g | Freq: Once | INTRAVENOUS | Status: DC

## 2015-03-31 MED ORDER — BUPIVACAINE HCL (PF) 0.25 % IJ SOLN: 30.0000 mL | Freq: Once | INTRAMUSCULAR | Status: DC

## 2015-03-31 NOTE — Patient Instructions (Signed)
PLAN  Continue present medication hydrocodone acetaminophen and begin taking antibiotic Ceftin Ceftin as prescribed. Please obtain your antibiotic Ceftin today and begin taking antibiotic today  F/U PCP Dr. Quillian QuinceBliss for evaliation of  BP and general medical  condition.  F/U surgical evaluation. May consider pending follow-up evaluations  F/U neurological evaluation. May consider pending follow-up evaluations  May consider radiofrequency rhizolysis or intraspinal procedures pending response to present treatment and F/U evaluation.  Patient to call Pain Management Center should patient have concerns prior to scheduled return appointment.

## 2015-03-31 NOTE — Progress Notes (Signed)
   Subjective:    Patient ID: Mariah Reeves, female    DOB: 01-Dec-1965, 49 y.o.   MRN: [id number]  HPI Patient is 49 year old female who returns to pain management today for further evaluation and treatment of pain involving the lower back lower extremity region especially. The patient also has a history of pain involving the cervical region and with headaches felt to be component of migraine headache as well as greater occipital neuralgia and myofascial pain related headaches in addition to multiple arthralgias and myalgias The patient called prior to her appointment earlier today stating that she was running late. Patient wanted to know if she could have procedure if she came to the [facility]. The patient was informed that she could still come to the [facility] and have her procedure. Once patient arrived in [facility] patient stated that she would not have time to stay for the procedure since her right head to the bite 1 PM. We discussed patient's condition and after evaluation of patient's condition and unsuccessful attempts at establishing IV 2 the patient stated that she would be unable to stay for the procedure. Subsequently procedure was rescheduled.     Review of Systems     Objective:   Physical Exam The patient was a tends to palpation of paraspinal musculature region cervical and cervical facet region of mild to moderate degree. There was tends to palpation over the splenius capitis and occipitalis region of mild to moderate degree. Patient appeared to be with unremarkable Spurling's maneuver and was with minimal increased pain with Tinel and Phalen's maneuver. Patient appeared to be with bilaterally equal grip strength. Palpation over the thoracic facet thoracic paraspinal musculature region was a tends to palpation of moderate degree. There was moderate muscle spasm and thoracic paraspinal musculature region with no crepitus of the thoracic region  noted. Palpation over the lumbar paraspinal musculature region lumbar facet region was a tends to palpation of moderate to moderately severe degree. Lateral bending rotation and extension and palpation of the lumbar facets reproduce moderately severe discomfort. There was moderate tends to palpation over the PSIS PII S region as well as the gluteal and piriformis musculature regions. Straight leg raising was tolerated to approximately 20 without increased pain with dorsiflexion noted. There was mild tinnitus of the greater trochanteric region and iliotibial band region. No sensory deficit of dermatomal distribution was detected. There was negative clonus and negative Homans. Abdomen was without tenderness to palpation and no costovertebral angle tenderness was noted       Assessment & Plan:     Degenerative disc disease lumbar spine  Lumbar facet syndrome  Sacroiliac joint dysfunction  Bilateral occipital neuralgia  Migraine     PLAN   Continue present medication hydrocodone acetaminophen  Lumbar facet, medial branch nerve, blocks to be performed at time return appointment  F/U PCP Dr. Quillian QuinceBliss for evaliation of  BP and general medical  condition  F/U surgical evaluation. May consider pending follow-up evaluations  F/U neurological evaluation. May consider pending follow-up evaluations  F/U psych evaluation  May consider radiofrequency rhizolysis or intraspinal procedures pending response to present treatment and F/U evaluation   Patient to call Pain Management Center should patient have concerns prior to scheduled return appointment.

## 2015-03-31 NOTE — Progress Notes (Signed)
Safety precautions to be maintained throughout the outpatient stay will include: orient to surroundings, keep bed in low position, maintain call bell within reach at all times, provide assistance with transfer out of bed and ambulation.  

## 2015-04-05 ENCOUNTER — Ambulatory Visit: Payer: BC Managed Care – PPO | Admitting: Pain Medicine

## 2015-04-07 ENCOUNTER — Ambulatory Visit: Payer: BC Managed Care – PPO | Attending: Pain Medicine | Admitting: Pain Medicine

## 2015-04-07 ENCOUNTER — Telehealth: Payer: Self-pay | Admitting: Pain Medicine

## 2015-04-07 ENCOUNTER — Encounter: Payer: Self-pay | Admitting: Pain Medicine

## 2015-04-07 VITALS — BP 134/82 | HR 91 | Temp 97.8°F | Resp 16 | Ht 60.0 in | Wt 200.0 lb

## 2015-04-07 DIAGNOSIS — M533 Sacrococcygeal disorders, not elsewhere classified: Secondary | ICD-10-CM

## 2015-04-07 DIAGNOSIS — M47897 Other spondylosis, lumbosacral region: Secondary | ICD-10-CM | POA: Insufficient documentation

## 2015-04-07 DIAGNOSIS — M5126 Other intervertebral disc displacement, lumbar region: Secondary | ICD-10-CM | POA: Diagnosis not present

## 2015-04-07 DIAGNOSIS — M5136 Other intervertebral disc degeneration, lumbar region: Secondary | ICD-10-CM | POA: Diagnosis not present

## 2015-04-07 DIAGNOSIS — M5481 Occipital neuralgia: Secondary | ICD-10-CM

## 2015-04-07 DIAGNOSIS — M4806 Spinal stenosis, lumbar region: Secondary | ICD-10-CM | POA: Diagnosis not present

## 2015-04-07 DIAGNOSIS — M47816 Spondylosis without myelopathy or radiculopathy, lumbar region: Secondary | ICD-10-CM

## 2015-04-07 MED ORDER — SODIUM CHLORIDE 0.9 % IJ SOLN
INTRAMUSCULAR | Status: AC
  Filled 2015-04-07: qty 30

## 2015-04-07 MED ORDER — BUPIVACAINE HCL (PF) 0.25 % IJ SOLN
30.0000 mL | Freq: Once | INTRAMUSCULAR | Status: DC
Start: 2015-04-07 — End: 2015-04-19

## 2015-04-07 MED ORDER — LACTATED RINGERS IV SOLN: 1000.0000 mL | INTRAVENOUS | Status: DC

## 2015-04-07 MED ORDER — MIDAZOLAM HCL 5 MG/5ML IJ SOLN
5.0000 mg | Freq: Once | INTRAMUSCULAR | Status: AC
  Administered 2015-04-07: 5 mg via INTRAVENOUS

## 2015-04-07 MED ORDER — CEFAZOLIN SODIUM 1-5 GM-% IV SOLN: 1.0000 g | Freq: Once | INTRAVENOUS | Status: DC

## 2015-04-07 MED ORDER — FENTANYL CITRATE (PF) 100 MCG/2ML IJ SOLN
INTRAMUSCULAR | Status: AC
  Administered 2015-04-07: 100 ug via INTRAVENOUS
  Filled 2015-04-07: qty 2

## 2015-04-07 MED ORDER — CEFAZOLIN SODIUM 1 G IJ SOLR
INTRAMUSCULAR | Status: AC
  Administered 2015-04-07: 1 g via INTRAVENOUS
  Filled 2015-04-07: qty 10

## 2015-04-07 MED ORDER — SODIUM CHLORIDE 0.9 % IJ SOLN: 20.0000 mL | Freq: Once | INTRAMUSCULAR | Status: DC

## 2015-04-07 MED ORDER — ORPHENADRINE CITRATE 30 MG/ML IJ SOLN
INTRAMUSCULAR | Status: AC
  Administered 2015-04-07: 60 mg via INTRAMUSCULAR
  Filled 2015-04-07: qty 2

## 2015-04-07 MED ORDER — ORPHENADRINE CITRATE 30 MG/ML IJ SOLN
60.0000 mg | Freq: Once | INTRAMUSCULAR | Status: AC
  Administered 2015-04-07: 60 mg via INTRAMUSCULAR

## 2015-04-07 MED ORDER — CEFUROXIME AXETIL 250 MG PO TABS: 250.0000 mg | ORAL_TABLET | Freq: Two times a day (BID) | ORAL | Status: DC

## 2015-04-07 MED ORDER — TRIAMCINOLONE ACETONIDE 40 MG/ML IJ SUSP
INTRAMUSCULAR | Status: AC
  Administered 2015-04-07: 40 mg
  Filled 2015-04-07: qty 1

## 2015-04-07 MED ORDER — MIDAZOLAM HCL 5 MG/5ML IJ SOLN
INTRAMUSCULAR | Status: AC
  Administered 2015-04-07: 5 mg via INTRAVENOUS
  Filled 2015-04-07: qty 5

## 2015-04-07 MED ORDER — FENTANYL CITRATE (PF) 100 MCG/2ML IJ SOLN
100.0000 ug | Freq: Once | INTRAMUSCULAR | Status: AC
  Administered 2015-04-07: 100 ug via INTRAVENOUS

## 2015-04-07 MED ORDER — BUPIVACAINE HCL (PF) 0.25 % IJ SOLN
INTRAMUSCULAR | Status: AC
  Administered 2015-04-07: 30 mL
  Filled 2015-04-07: qty 30

## 2015-04-07 MED ORDER — TRIAMCINOLONE ACETONIDE 40 MG/ML IJ SUSP: 40.0000 mg | Freq: Once | INTRAMUSCULAR | Status: DC

## 2015-04-07 NOTE — Progress Notes (Signed)
Subjective:    Patient ID: Mariah Reeves, female    DOB: 1966-05-08, 49 y.o.   MRN: [id number]  HPI  PROCEDURE PERFORMED: Lumbar facet (medial branch block)   NOTE: The patient is a 49 y.o. female who returns to [facility] Center for further evaluation and treatment of pain involving the lumbar and lower extremity region. MRI  revealed the patient to be with evidence of degenerative disc disease lumbar spine. Small left-sided disc protrusion L1-2. Distant facet degeneration L4-5 causing moderate spinal stenosis. L5-S1 spondylosis with foraminal stenosis bilaterally. Patient is a severe pain aggravated by twisting and turning maneuvers. There is concern regarding significant component of patient's pain being due to facet arthropathy with facet syndrome. The risks, benefits, and expectations of the procedure have been discussed and explained to the patient who was understanding and in agreement with suggested treatment plan. We will proceed with interventional treatment as discussed and as explained to the patient who was understanding and wished to proceed with procedure as planned.   DESCRIPTION OF PROCEDURE: Lumbar facet (medial branch block) with IV Versed, IV fentanyl conscious sedation, EKG, blood pressure, pulse, and pulse oximetry monitoring. The procedure was performed with the patient in the prone position. Betadine prep of proposed entry site performed.   NEEDLE PLACEMENT AT: Left L 3 lumbar facet (medial branch block). Under fluoroscopic guidance with oblique orientation of 15 degrees, a 22-gauge needle was inserted at the L 3 vertebral body level with needle placed at the targeted area of Burton's Eye or Eye of the Scotty Dog with documentation of needle placement in the superior and lateral border of targeted area of Burton's Eye or Eye of the Scotty Dog with oblique orientation of 15 degrees. Following documentation of needle placement at the L 3 vertebral body level, needle placement  was then accomplished at the L 4 vertebral body level.   NEEDLE PLACEMENT AT L4 and L5 VERTEBRAL BODY LEVELS ON THE LEFT SIDE The procedure was performed at the L4 and L5 vertebral body levels exactly as was performed at the L 3 vertebral body level utilizing the same technique and under fluoroscopic guidance.  NEEDLE PLACEMENT AT THE SACRAL ALA with AP view of the lumbosacral spine. With the patient in the prone position, Betadine prep of proposed entry site accomplished, a 22 gauge needle was inserted in the region of the sacral ala (groove formed by the superior articulating process of S1 and the sacral wing). Following documentation of needle placement at the sacral ala,  needle placement was then accomplished at the S1 foramen level.   NEEDLE PLACEMENT AT THE S1 FORAMEN LEVEL under fluoroscopic guidance with AP view of the lumbosacral spine and cephalad orientation of the fluoroscope, a 22-gauge needle was placed at the superior and lateral border of the S1 foramen under fluoroscopic guidance. Following documentation of needle placement at the S1 foramen.   Needle placement was then verified at all levels on lateral view. Following documentation of needle placement at all levels on lateral view and following negative aspiration for heme and CSF, each level was injected with 1 mL of 0.25% bupivacaine with Kenalog.     LUMBAR FACET, MEDIAL BRANCH NERVE, BLOCKS PERFORMED ON THE RIGHT SIDE   The procedure was performed on the right side exactly as was performed on the left side at the same levels and utilizing the same technique under fluoroscopic guidance.     The patient tolerated the procedure well. A total of 40 mg of Kenalog  was utilized for the procedure.   PLAN:  1. Medications: The patient will continue presently prescribed medication hydrocodone acetaminophen 2. May consider modification of treatment regimen at time of return appointment pending response to treatment rendered on  today's visit. 3. The patient is to follow-up with primary care physician Dr. Quillian Quince for further evaluation of blood pressure and general medical condition status post steroid injection performed on today's visit. 4. Surgical follow-up evaluation. 5. Neurological follow-up evaluation. May consider [facility] EMG studies and other studies pending follow-up evaluation and response to present treatment 6. The patient may be candidate for radiofrequency procedures, implantation type procedures, and other treatment pending response to treatment and follow-up evaluation. 7. The patient has been advised to call the Pain Management Center prior to scheduled return appointment should there be significant change in condition or should patient have other concerns regarding condition prior to scheduled return appointment.  The patient is understanding and in agreement with suggested treatment plan.   Review of Systems     Objective:   Physical Exam        Assessment & Plan:

## 2015-04-07 NOTE — Progress Notes (Signed)
Safety precautions to be maintained throughout the outpatient stay will include: orient to surroundings, keep bed in low position, maintain call bell within reach at all times, provide assistance with transfer out of bed and ambulation.  

## 2015-04-07 NOTE — Patient Instructions (Addendum)
Continue present medication hydrocodone acetaminophen and began taking antibiotics Ceftin today as prescribed  F/U PCP  Dr.Bliss  for evaliation of  BP and general medical condition See Dr. Quillian Quince today for tomorrow Thursday, October 27 for evaluation of blood pressure as discussed  F/U surgical evaluation. May consider pending follow-up evaluations  F/U neurological evaluation. May consider pending follow-up evaluations  May consider radiofrequency rhizolysis or intraspinal procedures pending response to present treatment and F/U evaluation Facet Joint Block, Care After Refer to this sheet in the next few weeks. These instructions provide you with information on caring for yourself after your procedure. Your health care provider may also give you more specific instructions. Your treatment has been planned according to current medical practices, but problems sometimes occur. Call your health care provider if you have any problems or questions after your procedure. HOME CARE INSTRUCTIONS   Keep track of the amount of pain relief you feel and how long it lasts.  Limit pain medicine within the first 4-6 hours after the procedure as directed by your health care provider.  Resume taking dietary supplements and medicines as directed by your health care provider.  You may resume your regular diet.  Do not apply heat near or over the injection site(s) for 24 hours.   Do not take a bath or soak in water (such as a pool or lake) for 24 hours.  Do not drive for 24 hours unless approved by your health care provider.  Avoid strenuous activity for 24 hours.  Remove your bandages the morning after the procedure.   If the injection site is tender, applying an ice pack may relieve some tenderness. To do this:  Put ice in a bag.  Place a towel between your skin and the bag.  Leave the ice on for 15-20 minutes, 3-4 times a day.  Keep follow-up appointments as directed by your health care  provider. SEEK MEDICAL CARE IF:   Your pain is not controlled by your medicines.   There is drainage from the injection site.   There is significant bleeding or swelling at the injection site.  You have diabetes and your blood sugar is above 180 mg/dL. SEEK IMMEDIATE MEDICAL CARE IF:   You develop a fever of 101F (38.3C) or greater.   You have worsening pain or swelling around the injection site.   You have red streaking around the injection site.   You develop severe pain that is not controlled by your medicines.   You develop a headache, stiff neck, nausea, or vomiting.   Your eyes become very sensitive to light.   You have weakness, paralysis, or tingling in your arms or legs that was not present before the procedure.   You develop difficulty urinating or breathing.    This information is not intended to replace advice given to you by your health care provider. Make sure you discuss any questions you have with your health care provider.   Document Released: 05/15/2012 Document Revised: 06/19/2014 Document Reviewed: 05/15/2012 Elsevier Interactive Patient Education 2016 Else[employer] Inc[employer] Facet Joint Block, Care After Refer to this sheet in the next few weeks. These instructions provide you with information on caring for yourself after your procedure. Your health care provider may also give you more specific instructions. Your treatment has been planned according to current medical practices, but problems sometimes occur. Call your health care provider if you have any problems or questions after your procedure. HOME CARE INSTRUCTIONS   Keep track of the  amount of pain relief you feel and how long it lasts.  Limit pain medicine within the first 4-6 hours after the procedure as directed by your health care provider.  Resume taking dietary supplements and medicines as directed by your health care provider.  You may resume your regular diet.  Do not apply heat near  or over the injection site(s) for 24 hours.   Do not take a bath or soak in water (such as a pool or lake) for 24 hours.  Do not drive for 24 hours unless approved by your health care provider.  Avoid strenuous activity for 24 hours.  Remove your bandages the morning after the procedure.   If the injection site is tender, applying an ice pack may relieve some tenderness. To do this:  Put ice in a bag.  Place a towel between your skin and the bag.  Leave the ice on for 15-20 minutes, 3-4 times a day.  Keep follow-up appointments as directed by your health care provider. SEEK MEDICAL CARE IF:   Your pain is not controlled by your medicines.   There is drainage from the injection site.   There is significant bleeding or swelling at the injection site.  You have diabetes and your blood sugar is above 180 mg/dL. SEEK IMMEDIATE MEDICAL CARE IF:   You develop a fever of 101F (38.3C) or greater.   You have worsening pain or swelling around the injection site.   You have red streaking around the injection site.   You develop severe pain that is not controlled by your medicines.   You develop a headache, stiff neck, nausea, or vomiting.   Your eyes become very sensitive to light.   You have weakness, paralysis, or tingling in your arms or legs that was not present before the procedure.   You develop difficulty urinating or breathing.    This information is not intended to replace advice given to you by your health care provider. Make sure you discuss any questions you have with your health care provider.   Document Released: 05/15/2012 Document Revised: 06/19/2014 Document Reviewed: 05/15/2012 Elsevier Interactive Patient Education 2016 Elsevier Inc. Pain Management Discharge Instructions  General Discharge Instructions :  If you need to reach your doctor call: Monday-Friday 8:00 am - 4:00 pm at [phone] or toll free [phone].  After clinic hours  [phone] to have operator reach doctor.  Bring all of your medication bottles to all your appointments in the pain clinic.  To cancel or reschedule your appointment with Pain Management please remember to call 24 hours in advance to avoid a fee.  Refer to the educational materials which you have been given on: General Risks, I had my Procedure. Discharge Instructions, Post Sedation.  Post Procedure Instructions:  The drugs you were given will stay in your system until tomorrow, so for the next 24 hours you should not drive, make any legal decisions or drink any alcoholic beverages.  You may eat anything you prefer, but it is better to start with liquids then soups and crackers, and gradually work up to solid foods.  Please notify your doctor immediately if you have any unusual bleeding, trouble breathing or pain that is not related to your normal pain.  Depending on the type of procedure that was done, some parts of your body may feel week and/or numb.  This usually clears up by tonight or the next day.  Walk with the use of an assistive device or accompanied by an  adult for the 24 hours.  You may use ice on the affected area for the first 24 hours.  Put ice in a Ziploc bag and cover with a towel and place against area 15 minutes on 15 minutes off.  You may switch to heat after 24 hours.Facet Joint Block, Care After Refer to this sheet in the next few weeks. These instructions provide you with information on caring for yourself after your procedure. Your health care provider may also give you more specific instructions. Your treatment has been planned according to current medical practices, but problems sometimes occur. Call your health care provider if you have any problems or questions after your procedure. HOME CARE INSTRUCTIONS   Keep track of the amount of pain relief you feel and how long it lasts.  Limit pain medicine within the first 4-6 hours after the procedure as directed by  your health care provider.  Resume taking dietary supplements and medicines as directed by your health care provider.  You may resume your regular diet.  Do not apply heat near or over the injection site(s) for 24 hours.   Do not take a bath or soak in water (such as a pool or lake) for 24 hours.  Do not drive for 24 hours unless approved by your health care provider.  Avoid strenuous activity for 24 hours.  Remove your bandages the morning after the procedure.   If the injection site is tender, applying an ice pack may relieve some tenderness. To do this:  Put ice in a bag.  Place a towel between your skin and the bag.  Leave the ice on for 15-20 minutes, 3-4 times a day.  Keep follow-up appointments as directed by your health care provider. SEEK MEDICAL CARE IF:   Your pain is not controlled by your medicines.   There is drainage from the injection site.   There is significant bleeding or swelling at the injection site.  You have diabetes and your blood sugar is above 180 mg/dL. SEEK IMMEDIATE MEDICAL CARE IF:   You develop a fever of 101F (38.3C) or greater.   You have worsening pain or swelling around the injection site.   You have red streaking around the injection site.   You develop severe pain that is not controlled by your medicines.   You develop a headache, stiff neck, nausea, or vomiting.   Your eyes become very sensitive to light.   You have weakness, paralysis, or tingling in your arms or legs that was not present before the procedure.   You develop difficulty urinating or breathing.    This information is not intended to replace advice given to you by your health care provider. Make sure you discuss any questions you have with your health care provider.   Document Released: 05/15/2012 Document Revised: 06/19/2014 Document Reviewed: 05/15/2012 Elsevier Interactive Patient Education 2016 Elsevier Inc. Facet Joint Block The facet  joints connect the bones of the spine (vertebrae). They make it possible for you to bend, twist, and make other movements with your spine. They also prevent you from overbending, overtwisting, and making other excessive movements.  A facet joint block is a procedure where a numbing medicine (anesthetic) is injected into a facet joint. Often, a type of anti-inflammatory medicine called a steroid is also injected. A facet joint block may be done for two reasons:   Diagnosis. A facet joint block may be done as a test to see whether neck or back pain is caused  by a worn-down or infected facet joint. If the pain gets better after a facet joint block, it means the pain is probably coming from the facet joint. If the pain does not get better, it means the pain is probably not coming from the facet joint.   Therapy. A facet joint block may be done to relieve neck or back pain caused by a facet joint. A facet joint block is only done as a therapy if the pain does not improve with medicine, exercise programs, physical therapy, and other forms of pain management. LET [facility] CARE PROVIDER KNOW ABOUT:   Any allergies you have.   All medicines you are taking, including vitamins, herbs, eyedrops, and over-the-counter medicines and creams.   Previous problems you or members of your family have had with the use of anesthetics.   Any blood disorders you have had.   Other health problems you have. RISKS AND COMPLICATIONS Generally, having a facet joint block is safe. However, as with any procedure, complications can occur. Possible complications associated with having a facet joint block include:   Bleeding.   Injury to a nerve near the injection site.   Pain at the injection site.   Weakness or numbness in areas controlled by nerves near the injection site.   Infection.   Temporary fluid retention.   Allergic reaction to anesthetics or medicines used during the procedure. BEFORE THE  PROCEDURE   Follow your health care provider's instructions if you are taking dietary supplements or medicines. You may need to stop taking them or reduce your dosage.   Do not take any new dietary supplements or medicines without asking your health care provider first.   Follow your health care provider's instructions about eating and drinking before the procedure. You may need to stop eating and drinking several hours before the procedure.   Arrange to have an adult drive you home after the procedure. PROCEDURE  You may need to remove your clothing and dress in an open-back gown so that your health care provider can access your spine.   The procedure will be done while you are lying on an X-ray table. Most of the time you will be asked to lie on your stomach, but you may be asked to lie in a different position if an injection will be made in your neck.   Special machines will be used to monitor your oxygen levels, heart rate, and blood pressure.   If an injection will be made in your neck, an intravenous (IV) tube will be inserted into one of your veins. Fluids and medicine will flow directly into your body through the IV tube.   The area over the facet joint where the injection will be made will be cleaned with an antiseptic soap. The surrounding skin will be covered with sterile drapes.   An anesthetic will be applied to your skin to make the injection area numb. You may feel a temporary stinging or burning sensation.   A video X-ray machine will be used to locate the joint. A contrast dye may be injected into the facet joint area to help with locating the joint.   When the joint is located, an anesthetic medicine will be injected into the joint through the needle.   Your health care provider will ask you whether you feel pain relief. If you do feel relief, a steroid may be injected to provide pain relief for a longer period of time. If you do not feel relief  or feel only  partial relief, additional injections of an anesthetic may be made in other facet joints.   The needle will be removed, the skin will be cleansed, and bandages will be applied.  AFTER THE PROCEDURE   You will be observed for 15-30 minutes before being allowed to go home. Do not drive. Have an adult drive you or take a taxi or public transportation instead.   If you feel pain relief, the pain will return in several hours or days when the anesthetic wears off.   You may feel pain relief 2-14 days after the procedure. The amount of time this relief lasts varies from person to person.   It is normal to feel some tenderness over the injected area(s) for 2 days following the procedure.   If you have diabetes, you may have a temporary increase in blood sugar.   This information is not intended to replace advice given to you by your health care provider. Make sure you discuss any questions you have with your health care provider.   Document Released: 10/18/2006 Document Revised: 06/19/2014 Document Reviewed: 03/18/2012 Elsevier Interactive Patient Education [employer]2016 [employer] Inc[employer]

## 2015-04-07 NOTE — Telephone Encounter (Signed)
explained to patient that Dr. Metta Clinesrisp does not prescribe pain meds on a procedure visit. May call previous prescriber and see if they will fill pain meds. Patient to discuss medications and procedure at next visit.

## 2015-04-07 NOTE — Telephone Encounter (Signed)
Pt has a question about instructions concerning pain meds to continue after procedure / please call to discuss

## 2015-04-08 ENCOUNTER — Telehealth: Payer: Self-pay | Admitting: *Deleted

## 2015-04-08 NOTE — Telephone Encounter (Signed)
Left voicemail with patient.

## 2015-04-08 NOTE — Telephone Encounter (Signed)
Mariah Reeves called back to let us know that she is still having a lot of pain and soreness, has called her previous prescriber to see if they will prescribe pain medication and would like to know if this will be approved.  Explained to her that  will be up to Dr Metta Clinesrisp discretion.  Also addressed patients concern about pain; ice to back alternated with heat, also the length of time that it may take for steroids to be of benefit.  Explained that all of this information is printed out for her along with her f/up appt.  Patient verbalizes that she has not read these yet but she will.  Encouraged to do so that she has appropriate expectations from procedure.  Patient verbalizes understanding of these instructions during this phone call and verbalizes appreciation for the information.

## 2015-04-09 ENCOUNTER — Telehealth: Payer: Self-pay | Admitting: Pain Medicine

## 2015-04-09 NOTE — Telephone Encounter (Signed)
Received note from Dr. Quillian QuinceBliss saying she is ok to refill patient's meds one time / will need phone call from Dr. Metta Clinesrisp Nurse confirming this request

## 2015-04-12 NOTE — Telephone Encounter (Signed)
Contacted Dr. Aggie CosierBliss's office, notified them that Dr. Metta Clinesrisp will begin prescribing pain meds for [name], but would like Dr. Quillian QuinceBliss to prescribe one more month.

## 2015-04-14 ENCOUNTER — Other Ambulatory Visit: Payer: Self-pay | Admitting: Pain Medicine

## 2015-04-14 ENCOUNTER — Ambulatory Visit: Payer: BC Managed Care – PPO | Admitting: Pain Medicine

## 2015-04-19 ENCOUNTER — Encounter: Payer: Self-pay | Admitting: Pain Medicine

## 2015-04-19 ENCOUNTER — Ambulatory Visit: Payer: BC Managed Care – PPO | Attending: Pain Medicine | Admitting: Pain Medicine

## 2015-04-19 VITALS — BP 128/75 | HR 77 | Temp 98.0°F | Resp 16 | Ht 60.0 in | Wt 200.0 lb

## 2015-04-19 DIAGNOSIS — M533 Sacrococcygeal disorders, not elsewhere classified: Secondary | ICD-10-CM | POA: Insufficient documentation

## 2015-04-19 DIAGNOSIS — G43909 Migraine, unspecified, not intractable, without status migrainosus: Secondary | ICD-10-CM | POA: Insufficient documentation

## 2015-04-19 DIAGNOSIS — M5481 Occipital neuralgia: Secondary | ICD-10-CM | POA: Diagnosis not present

## 2015-04-19 DIAGNOSIS — M5136 Other intervertebral disc degeneration, lumbar region: Secondary | ICD-10-CM | POA: Insufficient documentation

## 2015-04-19 DIAGNOSIS — M79605 Pain in left leg: Secondary | ICD-10-CM | POA: Diagnosis present

## 2015-04-19 DIAGNOSIS — M545 Low back pain: Secondary | ICD-10-CM | POA: Diagnosis present

## 2015-04-19 DIAGNOSIS — M79604 Pain in right leg: Secondary | ICD-10-CM | POA: Diagnosis present

## 2015-04-19 DIAGNOSIS — M47816 Spondylosis without myelopathy or radiculopathy, lumbar region: Secondary | ICD-10-CM

## 2015-04-19 MED ORDER — LACTATED RINGERS IV SOLN: 1000.0000 mL | INTRAVENOUS | Status: DC

## 2015-04-19 MED ORDER — MIDAZOLAM HCL 5 MG/5ML IJ SOLN: 5.0000 mg | Freq: Once | INTRAMUSCULAR | Status: DC

## 2015-04-19 MED ORDER — HYDROCODONE-ACETAMINOPHEN 10-325 MG PO TABS: ORAL_TABLET | ORAL | Status: DC

## 2015-04-19 MED ORDER — CEFUROXIME AXETIL 250 MG PO TABS: 250.0000 mg | ORAL_TABLET | Freq: Two times a day (BID) | ORAL | Status: DC

## 2015-04-19 MED ORDER — FENTANYL CITRATE (PF) 100 MCG/2ML IJ SOLN: 100.0000 ug | Freq: Once | INTRAMUSCULAR | Status: DC

## 2015-04-19 MED ORDER — ORPHENADRINE CITRATE 30 MG/ML IJ SOLN: 60.0000 mg | Freq: Once | INTRAMUSCULAR | Status: DC

## 2015-04-19 NOTE — Progress Notes (Signed)
   Subjective:    Patient ID: Mariah Reeves, female    DOB: [birth year], 49 y.o.   MRN: [id number]  HPI   The patient is a 49 year old female who returns to pain management Center for further evaluation and treatment of pain involving the region of the lower back and lower extremity regions. The patient states that her pain radiates to the buttocks from the lumbar region. The pain is aggravated by standing and walking twisting turning maneuvers and climbing stairs. The pain also interferes the patient ability to obtain restful sleep We discussed patient's condition and will proceed with block of the nerves to the sacroiliac joint at time of return appointment. The patient was understanding and in agreement with suggested treatment plan  Review of Systems     Objective:   Physical Exam  There was tenderness to palpation of this continues To elicit musculatures of mild degree with mild tenderness of the trapezius levator scapula and rhomboid musculature regions. Palpation of the acromioclavicular and glenohumeral joint regions reproduce mild discomfort. There was unremarkable Spurling's maneuver. Patient appeared to be with no increased pain with Tinel and Phalen's maneuver and appeared to be bilateral equal grip strength. Palpation of the thoracic facet thoracic paraspinal musculature region was attends to palpation of mild degree around the pain reproduced with palpation over the lumbar paraspinal musculatures and lumbar facet region palpation which reproduces moderately severe discomfort. There was severe tenderness to palpation of the PSIS and the S regions palpation of these regions reproduced severely disabling pain. Lateral bending and rotation extension and palpation of the lumbar facets reproduced moderate to moderately severe discomfort. There was straight leg raising tolerates approximately 30 without increased pain with dorsiflexion noted. There was negative clonus negative Homans and abdomen is  nontender with no costovertebral tenderness noted. The predominant portion of patient's pain was reproduced with palpation of the PSIS and PSIS regions. Patient was a positive Patrick's maneuver.      Assessment & Plan:   Sacroiliac joint dysfunction  Degenerative disc disease lumbar spine  Lumbar facet syndrome  Bilateral occipital neuralgia  Migraine    PLAN  Continue present medication hydrocodone acetaminophen  Block ofthe sacroiliac joint to be performed at time of return appointmen  F/U PCP Dr. Quillian QuinceBliss for evaliation of  BP and general medical  condition  F/U surgical evaluation. May consider pending follow-up evaluations  F/U neurological evaluation. May consider pending follow-up evaluations  May consider radiofrequency procedures and other treatment pending response to treatment and follow-up evaluation  The patient is to call pain management should they be change in condition prior to return appointment patient have other concerns regarding condition prior to return appointment

## 2015-04-19 NOTE — Patient Instructions (Addendum)
PLAN  Continue present medication hydrocodone acetaminophen   Block of nerves to the sacroiliac joint to bSacroiliac (SI) Joint Injection Patient Information  Description: The sacroiliac joint connects the scrum (very low back and tailbone) to tPain Management Discharge Instructions  General Discharge Instructions :  If you need to reach your doctor call: Monday-Friday 8:00 am - 4:00 pm at [phone] or toll free [phone].  After clinic hours [phone] to have operator reach doctor.  Bring all of your medication bottles to all your appointments in the pain clinic.  To cancel or reschedule your appointment with Pain Management please remember to call 24 hours in advance to avoid a fee.  Refer to the educational materials which you have been given on: General Risks, I had my Procedure. Discharge Instructions, Post Sedation.  Post Procedure Instructions:  The drugs you were given will stay in your system until tomorrow, so for the next 24 hours you should not drive, make any legal decisions or drink any alcoholic beverages.  You may eat anything you prefer, but it is better to start with liquids then soups and crackers, and gradually work up to solid foods.  Please notify your doctor immediately if you have any unusual bleeding, trouble breathing or pain that is not related to your normal pain.  Depending on the type of procedure that was done, some parts of your body may feel week and/or numb.  This usually clears up by tonight or the next day.  Walk with the use of an assistive device or accompanied by an adult for the 24 hours.  You may use ice on the affected area for the first 24 hours.  Put ice in a Ziploc bag and cover with a towel and place against area 15 minutes on 15 minutes off.  You may switch to heat after 24 hours.GENERAL RISKS AND COMPLICATIONS  What are the risk, side effects and possible complications? Generally speaking, most procedures are safe.  However,  with any procedure there are risks, side effects, and the possibility of complications.  The risks and complications are dependent upon the sites that are lesioned, or the type of nerve block to be performed.  The closer the procedure is to the spine, the more serious the risks are.  Great care is taken when placing the radio frequency needles, block needles or lesioning probes, but sometimes complications can occur. 1. Infection: Any time there is an injection through the skin, there is a risk of infection.  This is why sterile conditions are used for these blocks.  There are four possible types of infection. 1. Localized skin infection. 2. Central Nervous System Infection-This can be in the form of Meningitis, which can be deadly. 3. Epidural Infections-This can be in the form of an epidural abscess, which can cause pressure inside of the spine, causing compression of the spinal cord with subsequent paralysis. This would require an emergency surgery to decompress, and there are no guarantees that the patient would recover from the paralysis. 4. Discitis-This is an infection of the intervertebral discs.  It occurs in about 1% of discography procedures.  It is difficult to treat and it may lead to surgery.        2. Pain: the needles have to go through skin and soft tissues, will cause soreness.       3. Damage to internal structures:  The nerves to be lesioned may be near blood vessels or    other nerves which can be potentially damaged.  4. Bleeding: Bleeding is more common if the patient is taking blood thinners such as  aspirin, Coumadin, Ticiid, Plavix, etc., or if he/she have some genetic predisposition  such as hemophilia. Bleeding into the spinal canal can cause compression of the spinal  cord with subsequent paralysis.  This would require an emergency surgery to  decompress and there are no guarantees that the patient would recover from the  paralysis.       5. Pneumothorax:  Puncturing of a  lung is a possibility, every time a needle is introduced in  the area of the chest or upper back.  Pneumothorax refers to free air around the  collapsed lung(s), inside of the thoracic cavity (chest cavity).  Another two possible  complications related to a similar event would include: Hemothorax and Chylothorax.   These are variations of the Pneumothorax, where instead of air around the collapsed  lung(s), you may have blood or chyle, respectively.       6. Spinal headaches: They may occur with any procedures in the area of the spine.       7. Persistent CSF (Cerebro-Spinal Fluid) leakage: This is a rare problem, but may occur  with prolonged intrathecal or epidural catheters either due to the formation of a fistulous  track or a dural tear.       8. Nerve damage: By working so close to the spinal cord, there is always a possibility of  nerve damage, which could be as serious as a permanent spinal cord injury with  paralysis.       9. Death:  Although rare, severe deadly allergic reactions known as "Anaphylactic  reaction" can occur to any of the medications used.      10. Worsening of the symptoms:  We can always make thing worse.  What are the chances of something like this happening? Chances of any of this occuring are extremely low.  By statistics, you have more of a chance of getting killed in a motor vehicle accident: while driving to the hospital than any of the above occurring .  Nevertheless, you should be aware that they are possibilities.  In general, it is similar to taking a shower.  Everybody knows that you can slip, hit your head and get killed.  Does that mean that you should not shower again?  Nevertheless always keep in mind that statistics do not mean anything if you happen to be on the wrong side of them.  Even if a procedure has a 1 (one) in a 1,000,000 (million) chance of going wrong, it you happen to be that one..Also, keep in mind that by statistics, you have more of a chance of  having something go wrong when taking medications.  Who should not have this procedure? If you are on a blood thinning medication (e.g. Coumadin, Plavix, see list of "Blood Thinners"), or if you have an active infection going on, you should not have the procedure.  If you are taking any blood thinners, please inform your physician.  How should I prepare for this procedure?  Do not eat or drink anything at least six hours prior to the procedure.  Bring a driver with you .  It cannot be a taxi.  Come accompanied by an adult that can drive you back, and that is strong enough to help you if your legs get weak or numb from the local anesthetic.  Take all of your medicines the morning of the procedure with just enough water  to swallow them.  If you have diabetes, make sure that you are scheduled to have your procedure done first thing in the morning, whenever possible.  If you have diabetes, take only half of your insulin dose and notify our nurse that you have done so as soon as you arrive at the clinic.  If you are diabetic, but only take blood sugar pills (oral hypoglycemic), then do not take them on the morning of your procedure.  You may take them after you have had the procedure.  Do not take aspirin or any aspirin-containing medications, at least eleven (11) days prior to the procedure.  They may prolong bleeding.  Wear loose fitting clothing that may be easy to take off and that you would not mind if it got stained with Betadine or blood.  Do not wear any jewelry or perfume  Remove any nail coloring.  It will interfere with some of our monitoring equipment.  NOTE: Remember that this is not meant to be interpreted as a complete list of all possible complications.  Unforeseen problems may occur.  BLOOD THINNERS The following drugs contain aspirin or other products, which can cause increased bleeding during surgery and should not be taken for 2 weeks prior to and 1 week after surgery.   If you should need take something for relief of minor pain, you may take acetaminophen which is found in Tylenol,m Datril, Anacin-3 and Panadol. It is not blood thinner. The products listed below are.  Do not take any of the products listed below in addition to any listed on your instruction sheet.  A.P.C or A.P.C with Codeine Codeine Phosphate Capsules #3 Ibuprofen Ridaura  ABC compound Congesprin Imuran rimadil  Advil Cope Indocin Robaxisal  Alka-Seltzer Effervescent Pain Reliever and Antacid Coricidin or Coricidin-D  Indomethacin Rufen  Alka-Seltzer plus Cold Medicine Cosprin Ketoprofen S-A-C Tablets  Anacin Analgesic Tablets or Capsules Coumadin Korlgesic Salflex  Anacin Extra Strength Analgesic tablets or capsules CP-2 Tablets Lanoril Salicylate  Anaprox Cuprimine Capsules Levenox Salocol  Anexsia-D Dalteparin Magan Salsalate  Anodynos Darvon compound Magnesium Salicylate Sine-off  Ansaid Dasin Capsules Magsal Sodium Salicylate  Anturane Depen Capsules Marnal Soma  APF Arthritis pain formula Dewitt's Pills Measurin Stanback  Argesic Dia-Gesic Meclofenamic Sulfinpyrazone  Arthritis Bayer Timed Release Aspirin Diclofenac Meclomen Sulindac  Arthritis pain formula Anacin Dicumarol Medipren Supac  Analgesic (Safety coated) Arthralgen Diffunasal Mefanamic Suprofen  Arthritis Strength Bufferin Dihydrocodeine Mepro Compound Suprol  Arthropan liquid Dopirydamole Methcarbomol with Aspirin Synalgos  ASA tablets/Enseals Disalcid Micrainin Tagament  Ascriptin Doan's Midol Talwin  Ascriptin A/D Dolene Mobidin Tanderil  Ascriptin Extra Strength Dolobid Moblgesic Ticlid  Ascriptin with Codeine Doloprin or Doloprin with Codeine Momentum Tolectin  Asperbuf Duoprin Mono-gesic Trendar  Aspergum Duradyne Motrin or Motrin IB Triminicin  Aspirin plain, buffered or enteric coated Durasal Myochrisine Trigesic  Aspirin Suppositories Easprin Nalfon Trillsate  Aspirin with Codeine Ecotrin Regular or Extra  Strength Naprosyn Uracel  Atromid-S Efficin Naproxen Ursinus  Auranofin Capsules Elmiron Neocylate Vanquish  Axotal Emagrin Norgesic Verin  Azathioprine Empirin or Empirin with Codeine Normiflo Vitamin E  Azolid Emprazil Nuprin Voltaren  Bayer Aspirin plain, buffered or children's or timed BC Tablets or powders Encaprin Orgaran Warfarin Sodium  Buff-a-Comp Enoxaparin Orudis Zorpin  Buff-a-Comp with Codeine Equegesic Os-Cal-Gesic   Buffaprin Excedrin plain, buffered or Extra Strength Oxalid   Bufferin Arthritis Strength Feldene Oxphenbutazone   Bufferin plain or Extra Strength Feldene Capsules Oxycodone with Aspirin   Bufferin with Codeine Fenoprofen Fenoprofen Pabalate or Pabalate-SF  Buffets II Flogesic Panagesic   Buffinol plain or Extra Strength Florinal or Florinal with Codeine Panwarfarin   Buf-Tabs Flurbiprofen Penicillamine   Butalbital Compound Four-way cold tablets Penicillin   Butazolidin Fragmin Pepto-Bismol   Carbenicillin Geminisyn Percodan   Carna Arthritis Reliever Geopen Persantine   Carprofen Gold's salt Persistin   Chloramphenicol Goody's Phenylbutazone   Chloromycetin Haltrain Piroxlcam   Clmetidine heparin Plaquenil   Cllnoril Hyco-pap Ponstel   Clofibrate Hydroxy chloroquine Propoxyphen         Before stopping any of these medications, be sure to consult the physician who ordered them.  Some, such as Coumadin (Warfarin) are ordered to prevent or treat serious conditions such as "deep thrombosis", "pumonary embolisms", and other heart problems.  The amount of time that you may need off of the medication may also vary with the medication and the reason for which you were taking it.  If you are taking any of these medications, please make sure you notify your pain physician before you undergo any procedures.         he ilium (a pelvic bone which also forms half of the hip joint).  Normally this joint experiences very little motion.  When this joint becomes  inflamed or unstable low back and or hip and pelvis pain may result.  Injection of this joint with local anesthetics (numbing medicines) and steroids can provide diagnostic information and reduce pain.  This injection is performed with the aid of x-ray guidance into the tailbone area while you are lying on your stomach.   You may experience an electrical sensation down the leg while this is being done.  You may also experience numbness.  We also may ask if we are reproducing your normal pain during the injection.  Conditions which may be treated SI injection:   Low back, buttock, hip or leg pain  Preparation for the Injection:  1. Do not eat any solid food or dairy products within 6 hours of your appointment.  2. You may drink clear liquids up to 2 hours before appointment.  Clear liquids include water, black coffee, juice or soda.  No milk or cream please. 3. You may take your regular medications, including pain medications with a sip of water before your appointment.  Diabetics should hold regular insulin (if take separately) and take 1/2 normal NPH dose the morning of the procedure.  Carry some sugar containing items with you to your appointment. 4. A driver must accompany you and be prepared to drive you home after your procedure. 5. Bring all of your current medications with you. 6. An IV may be inserted and sedation may be given at the discretion of the physician. 7. A blood pressure cuff, EKG and other monitors will often be applied during the procedure.  Some patients may need to have extra oxygen administered for a short period.  8. You will be asked to provide medical information, including your allergies, prior to the procedure.  We must know immediately if you are taking blood thinners (like Coumadin/Warfarin) or if you are allergic to IV iodine contrast (dye).  We must know if you could possible be pregnant.  Possible side effects:   Bleeding from needle site  Infection (rare,  may require surgery)  Nerve injury (rare)  Numbness & tingling (temporary)  A brief convulsion or seizure  Light-headedness (temporary)  Pain at injection site (several days)  Decreased blood pressure (temporary)  Weakness in the leg (temporary)   Call if you experience:  New onset weakness or numbness of an extremity below the injection site that last more than 8 hours.  Hives or difficulty breathing ( go to the emergency room)  Inflammation or drainage at the injection site  Any new symptoms which are concerning to you  Please note:  Although the local anesthetic injected can often make your back/ hip/ buttock/ leg feel good for several hours after the injections, the pain will likely return.  It takes 3-7 days for steroids to work in the sacroiliac area.  You may not notice any pain relief for at least that one week.  If effective, we will often do a series of three injections spaced 3-6 weeks apart to maximally decrease your pain.  After the initial series, we generally will wait some months before a repeat injection of the same type.  If you have any questions, please call [phone] [facility]  e performed at time of return appointment  F/U PCP Dr. Quillian Quince for evaliation of  BP and general medical  condition.  F/U surgical evaluation. May consider pending follow-up evaluations  F/U neurological evaluation. May consider pending follow-up evaluations  May consider radiofrequency rhizolysis or intraspinal procedures pending response to present treatment and F/U evaluation.  Patient to call Pain Management Center should patient have concerns prior to scheduled return appointment.

## 2015-04-19 NOTE — Progress Notes (Signed)
Safety precautions to be maintained throughout the outpatient stay will include: orient to surroundings, keep bed in low position, maintain call bell within reach at all times, provide assistance with transfer out of bed and ambulation.  

## 2015-04-23 ENCOUNTER — Other Ambulatory Visit: Payer: Self-pay | Admitting: Pain Medicine

## 2015-04-26 ENCOUNTER — Ambulatory Visit: Payer: BC Managed Care – PPO | Attending: Pain Medicine | Admitting: Pain Medicine

## 2015-04-26 ENCOUNTER — Encounter: Payer: Self-pay | Admitting: Pain Medicine

## 2015-04-26 VITALS — BP 132/90 | HR 93 | Temp 98.1°F | Resp 20 | Ht 60.0 in | Wt 205.0 lb

## 2015-04-26 DIAGNOSIS — M47816 Spondylosis without myelopathy or radiculopathy, lumbar region: Secondary | ICD-10-CM | POA: Diagnosis not present

## 2015-04-26 DIAGNOSIS — M5136 Other intervertebral disc degeneration, lumbar region: Secondary | ICD-10-CM

## 2015-04-26 DIAGNOSIS — M545 Low back pain: Secondary | ICD-10-CM | POA: Diagnosis present

## 2015-04-26 DIAGNOSIS — M79604 Pain in right leg: Secondary | ICD-10-CM | POA: Diagnosis present

## 2015-04-26 DIAGNOSIS — M79605 Pain in left leg: Secondary | ICD-10-CM | POA: Diagnosis present

## 2015-04-26 DIAGNOSIS — M5481 Occipital neuralgia: Secondary | ICD-10-CM

## 2015-04-26 DIAGNOSIS — M533 Sacrococcygeal disorders, not elsewhere classified: Secondary | ICD-10-CM

## 2015-04-26 MED ORDER — BUPIVACAINE HCL (PF) 0.25 % IJ SOLN
INTRAMUSCULAR | Status: AC
Start: 2015-04-26 — End: 2015-04-26
  Filled 2015-04-26: qty 30

## 2015-04-26 MED ORDER — FENTANYL CITRATE (PF) 100 MCG/2ML IJ SOLN
100.0000 ug | Freq: Once | INTRAMUSCULAR | Status: AC
  Administered 2015-04-26: 100 ug via INTRAVENOUS

## 2015-04-26 MED ORDER — LACTATED RINGERS IV SOLN: 1000.0000 mL | INTRAVENOUS | Status: DC

## 2015-04-26 MED ORDER — ORPHENADRINE CITRATE 30 MG/ML IJ SOLN
INTRAMUSCULAR | Status: AC
  Filled 2015-04-26: qty 2

## 2015-04-26 MED ORDER — MIDAZOLAM HCL 5 MG/5ML IJ SOLN
INTRAMUSCULAR | Status: AC
Start: 2015-04-26 — End: 2015-04-26
  Administered 2015-04-26: 5 mg via INTRAVENOUS
  Filled 2015-04-26: qty 5

## 2015-04-26 MED ORDER — TRIAMCINOLONE ACETONIDE 40 MG/ML IJ SUSP
INTRAMUSCULAR | Status: AC
  Filled 2015-04-26: qty 1

## 2015-04-26 MED ORDER — ORPHENADRINE CITRATE 30 MG/ML IJ SOLN: 60.0000 mg | Freq: Once | INTRAMUSCULAR | Status: DC

## 2015-04-26 MED ORDER — FENTANYL CITRATE (PF) 100 MCG/2ML IJ SOLN
INTRAMUSCULAR | Status: AC
  Administered 2015-04-26: 100 ug via INTRAVENOUS
  Filled 2015-04-26: qty 2

## 2015-04-26 MED ORDER — BUPIVACAINE HCL (PF) 0.25 % IJ SOLN
30.0000 mL | Freq: Once | INTRAMUSCULAR | Status: AC
  Administered 2015-04-26: 12:00:00

## 2015-04-26 MED ORDER — MIDAZOLAM HCL 5 MG/5ML IJ SOLN
5.0000 mg | Freq: Once | INTRAMUSCULAR | Status: AC
  Administered 2015-04-26: 5 mg via INTRAVENOUS

## 2015-04-26 MED ORDER — TRIAMCINOLONE ACETONIDE 40 MG/ML IJ SUSP
40.0000 mg | Freq: Once | INTRAMUSCULAR | Status: AC
  Administered 2015-04-26: 12:00:00

## 2015-04-26 NOTE — Progress Notes (Signed)
Safety precautions to be maintained throughout the outpatient stay will include: orient to surroundings, keep bed in low position, maintain call bell within reach at all times, provide assistance with transfer out of bed and ambulation.  

## 2015-04-26 NOTE — Patient Instructions (Addendum)
PLAN   Continue present medication hydrocodone acetaminophe  F/U PCP  Dr.Bliss  for evaliation of  BP and general medical  condition  F/U surgical evaluation. May consider pending follow-up evaluations  F/U neurological evaluation. May consider pending follow-up evaluations  May consider radiofrequency rhizolysis or intraspinal procedures pending response to present treatment and F/U evaluation   Patient to call Pain Management Center should patient have concerns prior to scheduled return appointment.Selective Nerve Root Block Patient Information  Description: Specific nerve roots exit the spinal canal and these nerves can be compressed and inflamed by a bulging disc and bone spurs.  By injecting steroids on the nerve root, we can potentially decrease the inflammation surrounding these nerves, which often leads to decreased pain.  Also, by injecting local anesthesia on the nerve root, this can provide us helpful information to give to your referring doctor if it decreases your pain.  Selective nerve root blocks can be done along the spine from the neck to the low back depending on the location of your pain.   After numbing the skin with local anesthesia, a small needle is passed to the nerve root and the position of the needle is verified using x-ray pictures.  After the needle is in correct position, we then deposit the medication.  You may experience a pressure sensation while this is being done.  The entire block usually lasts less than 15 minutes.  Conditions that may be treated with selective nerve root blocks:  Low back and leg pain  Spinal stenosis  Diagnostic block prior to potential surgery  Neck and arm pain  Post laminectomy syndrome  Preparation for the injection:  1. Do not eat any solid food or dairy products within 6 hours of your appointment. 2. You may drink clear liquids up to 2 hours before an appointment.  Clear liquids include water, black coffee, juice or  soda.  No milk or cream please. 3. You may take your regular medications, including pain medications, with a sip of water before your appointment.  Diabetics should hold regular insulin (if taken separately) and take 1/2 normal NPH dose the morning of the procedure.  Carry some sugar containing items with you to your appointment. 4. A driver must accompany you and be prepared to drive you home after your procedure. 5. Bring all your current medications with you. 6. An IV may be inserted and sedation may be given at the discretion of the physician. 7. A blood pressure cuff, EKG, and other monitors will often be applied during the procedure.  Some patients may need to have extra oxygen administered for a short period. 8. You will be asked to provide medical information, including allergies, prior to the procedure.  We must know immediately if you are taking blood  Thinners (like Coumadin) or if you are allergic to IV iodine contrast (dye).  Possible side-effects: All are usually temporary  Bleeding from needle site  Light headedness  Numbness and tingling  Decreased blood pressure  Weakness in arms/legs  Pressure sensation in back/neck  Pain at injection site (several days)  Possible complications: All are extremely rare  Infection  Nerve injury  Spinal headache (a headache wore with upright position)  Call if you experience:  Fever/chills associated with headache or increased back/neck pain  Headache worsened by an upright position  New onset weakness or numbness of an extremity below the injection site  Hives or difficulty breathing (go to the emergency room)  Inflammation or drainage at  the injection site(s)  Severe back/neck pain greater than usual  New symptoms which are concerning to you  Please note:  Although the local anesthetic injected can often make your back or neck feel good for several hours after the injection the pain will likely return.  It takes  3-5 days for steroids to work on the nerve root. You may not notice any pain relief for at least one week.  If effective, we will often do a series of 3 injections spaced 3-6 weeks apart to maximally decrease your pain.    If you have any questions, please call [phone] [facility]  General Discharge Instructions :  If you need to reach your doctor call: Monday-Friday 8:00 am - 4:00 pm at [phone] or toll free [phone].  After clinic hours [phone] to have operator reach doctor.  Bring all of your medication bottles to all your appointments in the pain clinic.  To cancel or reschedule your appointment with Pain Management please remember to call 24 hours in advance to avoid a fee.  Refer to the educational materials which you have been given on: General Risks, I had my Procedure. Discharge Instructions, Post Sedation.  Post Procedure Instructions:  The drugs you were given will stay in your system until tomorrow, so for the next 24 hours you should not drive, make any legal decisions or drink any alcoholic beverages.  You may eat anything you prefer, but it is better to start with liquids then soups and crackers, and gradually work up to solid foods.  Please notify your doctor immediately if you have any unusual bleeding, trouble breathing or pain that is not related to your normal pain.  Depending on the type of procedure that was done, some parts of your body may feel week and/or numb.  This usually clears up by tonight or the next day.  Walk with the use of an assistive device or accompanied by an adult for the 24 hours.  You may use ice on the affected area for the first 24 hours.  Put ice in a Ziploc bag and cover with a towel and place against area 15 minutes on 15 minutes off.  You may switch to heat after 24 hours.

## 2015-04-26 NOTE — Progress Notes (Signed)
Subjective:    Patient ID: Mariah Reeves, female    DOB: [birth year], 49 y.o.   MRN: [id number]  HPI PROCEDURE:  Block of nerves to the sacroiliac joint.   NOTE:  The patient is a 49 y.o. female who returns to the Pain Management Center for further evaluation and treatment of pain involving the lower back and lower extremity region with pain in the region of the buttocks as well. Prior MRI studies reveal multilevel degenerative changes of the lumbar spine. The patient is a severe pain with palpation over the PSIS and P IIS regions and is with positive Patrick's maneuver .   There is concern regarding a significant component of the patient's pain being due to sacroiliac joint dysfunction The risks, benefits, expectations of the procedure have been discussed and explained to the patient who is understanding and willing to proceed with interventional treatment in attempt to decrease severity of patient's symptoms, minimize the risk of medication escalation and  hopefully retard the progression of the patient's symptoms. We will proceed with what is felt to be a medically necessary procedure, block of nerves to the sacroiliac joint.   DESCRIPTION OF PROCEDURE:  Block of nerves to the sacroiliac joint.   The patient was taken to the fluoroscopy suite. With the patient in the prone position with EKG, blood pressure, pulse and pulse oximetry monitoring, IV Versed, IV fentanyl conscious sedation, Betadine prep of proposed entry site was performed.   Block of nerves at the L5 vertebral body level.   With the patient in prone position, under fluoroscopic guidance, a 22 -gauge needle was inserted at the L5 vertebral body level on the left side. With 15 degrees oblique orientation a 22 -gauge needle was inserted in the region known as Burton's eye or eye of the Scotty dog. Following documentation of needle placement in the area of Burton's eye or eye of the Scotty dog under fluoroscopic guidance, needle placement  was then accomplished at the sacral ala level on the left side.   Needle placement at the sacral ala.   With the patient in prone position under fluoroscopic guidance with AP view of the lumbosacral spine, a 22 -gauge needle was inserted in the region known as the sacral ala on the left side. Following documentation of needle placement on the left side under fluoroscopic guidance needle placement was then accomplished at the S1 foramen level.   Needle placement at the S1 foramen level.   With the patient in prone position under fluoroscopic guidance with AP view of the lumbosacral spine and cephalad orientation, a 22 -gauge needle was inserted at the superior and lateral border of the S1 foramen on the left side. Following documentation of needle placement at the S1 foramen level on the left side, needle placement was then accomplished at the S2 foramen level on the left side.   Needle placement at the S2 foramen level.   With the patient in prone position with AP view of the lumbosacral spine with cephalad orientation, a 22 - gauge needle was inserted at the superior and lateral border of the S2 foramen under fluoroscopic guidance on the left side. Following needle placement at the L5 vertebral body level, sacral ala, S1 foramen and S2 foramen on the left side, needle placement was verified on lateral view under fluoroscopic guidance.  Following needle placement documentation on lateral view, each needle was injected with 1 mL of 0.25% bupivacaine and Kenalog.   BLOCK OF THE NERVES TO SACROILIAC  JOINT ON THE RIGHT SIDE The procedure was performed on the right side at the same levels as was performed on the left side and utilizing the same technique as on the left side and was performed under fluoroscopic guidance as on the left side   A total of 10mg  of Kenalog was utilized for the procedure.   PLAN:  1. Medications: The patient will continue presently prescribed medication hydrocodone  acetaminophen  2. The patient will be considered for modification of treatment regimen pending response to the procedure performed on today's visit.  3. The patient is to follow-up with primary care physician Dr. Quillian QuinceBliss for evaluation of blood pressure and general medical condition following the procedure performed on today's visit.  4. Surgical evaluation as discussed.  5. Neurological evaluation as discussed.  6. The patient may be a candidate for radiofrequency procedures, implantation devices and other treatment pending response to treatment performed on today's visit and follow-up evaluation.  7. The patient has been advised to adhere to proper body mechanics and to avoid activities which may exacerbate the patient's symptoms.   Return appointment to Pain Management Center as scheduled.    Review of Systems     Objective:   Physical Exam        Assessment & Plan:

## 2015-04-27 ENCOUNTER — Telehealth: Payer: Self-pay | Admitting: *Deleted

## 2015-04-27 NOTE — Telephone Encounter (Signed)
Left voicemail re; procedure on yesterday, to call our office with questions or concerns.

## 2015-05-11 ENCOUNTER — Ambulatory Visit: Payer: BC Managed Care – PPO | Admitting: Pain Medicine

## 2015-05-14 ENCOUNTER — Encounter: Payer: Self-pay | Admitting: Emergency Medicine

## 2015-05-14 ENCOUNTER — Emergency Department
Admission: EM | Admit: 2015-05-14 | Discharge: 2015-05-14 | Disposition: A | Discharge status: Home / Self Care | Payer: BC Managed Care – PPO | Attending: Emergency Medicine | Admitting: Emergency Medicine

## 2015-05-14 DIAGNOSIS — R1110 Vomiting, unspecified: Secondary | ICD-10-CM | POA: Insufficient documentation

## 2015-05-14 DIAGNOSIS — R42 Dizziness and giddiness: Secondary | ICD-10-CM | POA: Diagnosis present

## 2015-05-14 LAB — BASIC METABOLIC PANEL
ANION GAP: 8 (ref 5–15)
BUN: 19 mg/dL (ref 6–20)
CHLORIDE: 109 mmol/L (ref 101–111)
CO2: 24 mmol/L (ref 22–32)
Calcium: 8.9 mg/dL (ref 8.9–10.3)
Creatinine, Ser: 0.88 mg/dL (ref 0.44–1.00)
GFR calc Af Amer: >60 mL/min (ref >60)
GLUCOSE: 160 mg/dL — AB (ref 65–99)
POTASSIUM: 3.7 mmol/L (ref 3.5–5.1)
Sodium: 141 mmol/L (ref 135–145)

## 2015-05-14 LAB — CBC
HEMATOCRIT: 42.1 % (ref 35.0–47.0)
HEMOGLOBIN: 13.9 g/dL (ref 12.0–16.0)
MCH: 28.4 pg (ref 26.0–34.0)
MCHC: 32.9 g/dL (ref 32.0–36.0)
MCV: 86.1 fL (ref 80.0–100.0)
Platelets: 260 10*3/uL (ref 150–440)
RBC: 4.9 MIL/uL (ref 3.80–5.20)
RDW: 16.1 % — AB (ref 11.5–14.5)
WBC: 5.3 10*3/uL (ref 3.6–11.0)

## 2015-05-14 LAB — TROPONIN I: Troponin I: <0.03 ng/mL (ref <0.031)

## 2015-05-14 MED ORDER — SODIUM CHLORIDE 0.9 % IV SOLN
Freq: Once | INTRAVENOUS | Status: AC
  Administered 2015-05-14: 14:00:00 via INTRAVENOUS

## 2015-05-14 MED ORDER — DIAZEPAM 5 MG/ML IJ SOLN
5.0000 mg | Freq: Once | INTRAMUSCULAR | Status: AC
  Administered 2015-05-14: 5 mg via INTRAVENOUS
  Filled 2015-05-14: qty 2

## 2015-05-14 MED ORDER — ONDANSETRON HCL 4 MG/2ML IJ SOLN
4.0000 mg | Freq: Once | INTRAMUSCULAR | Status: AC
  Administered 2015-05-14: 4 mg via INTRAVENOUS
  Filled 2015-05-14: qty 2

## 2015-05-14 MED ORDER — ONDANSETRON 4 MG PO TBDP
ORAL_TABLET | ORAL | Status: AC
  Administered 2015-05-14: 4 mg via ORAL
  Filled 2015-05-14: qty 1

## 2015-05-14 MED ORDER — ACETAMINOPHEN 325 MG PO TABS
650.0000 mg | ORAL_TABLET | Freq: Once | ORAL | Status: AC
  Administered 2015-05-14: 650 mg via ORAL
  Filled 2015-05-14: qty 2

## 2015-05-14 MED ORDER — DIAZEPAM 5 MG PO TABS: 5.0000 mg | ORAL_TABLET | Freq: Three times a day (TID) | ORAL | Status: DC | PRN

## 2015-05-14 MED ORDER — ONDANSETRON 4 MG PO TBDP
4.0000 mg | ORAL_TABLET | Freq: Once | ORAL | Status: AC
  Administered 2015-05-14: 4 mg via ORAL

## 2015-05-14 MED ORDER — MECLIZINE HCL 25 MG PO TABS
50.0000 mg | ORAL_TABLET | Freq: Once | ORAL | Status: AC
  Administered 2015-05-14: 50 mg via ORAL
  Filled 2015-05-14: qty 2

## 2015-05-14 MED ORDER — MECLIZINE HCL 25 MG PO TABS: 25.0000 mg | ORAL_TABLET | Freq: Three times a day (TID) | ORAL | Status: DC | PRN

## 2015-05-14 NOTE — ED Notes (Addendum)
Brought in via ems with dizziness and vomiting  States dizziness worse with movement  possible headache

## 2015-05-14 NOTE — Discharge Instructions (Signed)

## 2015-05-14 NOTE — ED Provider Notes (Signed)
[facility] Emergency Department Provider Note     Time seen: ----------------------------------------- 1:51 PM on 05/14/2015 -----------------------------------------    I have reviewed the triage vital signs and the nursing notes.   HISTORY  Chief Complaint Dizziness    HPI TORIANNA Reeves is a 49 y.o. female brought the ER by EMS for dizziness and vomiting.Patient states dizziness is worse with movement, she possibly has a headache, as never had this happen to her before. Patient states that of respiratory symptoms over the last week. She denies any recent changes in her medicines, denies any other complaints.   Past Medical History  Diagnosis Date  . Depression   . Bipolar 1 disorder (HCC)   . Allergy     Patient Active Problem List   Diagnosis Date Noted  . Bilateral occipital neuralgia 03/02/2015  . DDD (degenerative disc disease), lumbar 12/24/2014  . Facet syndrome, lumbar 12/24/2014  . Sacroiliac joint dysfunction 12/24/2014    Past Surgical History  Procedure Laterality Date  . Gastric bypass    . Knee surgery    . Cholecystectomy    . Carpal tunnel release Left   . Nasal septum surgery    . Middle ear surgery      Allergies Contrast media and Tape  Social History Social History  Substance Use Topics  . Smoking status: Never Smoker   . Smokeless tobacco: Never Used  . Alcohol Use: No    Review of Systems Constitutional: Negative for fever. Eyes: Negative for visual changes. ENT: Negative for sore throat. Cardiovascular: Negative for chest pain. Respiratory: Negative for shortness of breath. Gastrointestinal: Negative for abdominal pain, positive for vomiting Genitourinary: Negative for dysuria. Musculoskeletal: Negative for back pain. Skin: Negative for rash. Neurological: Negative for headaches, focal weakness or numbness. Positive for dizziness  10-point ROS otherwise  negative.  ____________________________________________   PHYSICAL EXAM:  VITAL SIGNS: ED Triage Vitals  Enc Vitals Group     BP 05/14/15 1241 121/83 mmHg     Pulse Rate 05/14/15 1241 91     Resp 05/14/15 1241 20     Temp 05/14/15 1241 98.5 F (36.9 C)     Temp Source 05/14/15 1241 Oral     SpO2 05/14/15 1241 93 %     Weight 05/14/15 1241 200 lb (90.719 kg)     Height 05/14/15 1241 5' (1.524 m)     Head Cir --      Peak Flow --      Pain Score --      Pain Loc --      Pain Edu? --      Excl. in GC? --     Constitutional: Alert and oriented. Well appearing and in no distress. Eyes: Conjunctivae are normal. PERRL. Normal extraocular movements. ENT   Head: Normocephalic and atraumatic.   Nose: No congestion/rhinnorhea.   Mouth/Throat: Mucous membranes are moist.   Neck: No stridor. Cardiovascular: Normal rate, regular rhythm. Normal and symmetric distal pulses are present in all extremities. No murmurs, rubs, or gallops. Respiratory: Normal respiratory effort without tachypnea nor retractions. Breath sounds are clear and equal bilaterally. No wheezes/rales/rhonchi. Gastrointestinal: Soft and nontender. No distention. No abdominal bruits.  Musculoskeletal: Nontender with normal range of motion in all extremities. No joint effusions.  No lower extremity tenderness nor edema. Neurologic:  Normal speech and language. No gross focal neurologic deficits are appreciated. Speech is normal. No gait instability. Skin:  Skin is warm, dry and intact. No rash noted. Psychiatric:  Mood and affect are normal. Speech and behavior are normal. Patient exhibits appropriate insight and judgment. ____________________________________________  EKG: Interpreted by me. Sinus rhythm rate of 96 with sinus arrhythmia, voltage criteria for LVH, normal axis. No evidence of acute infarction.  ____________________________________________  ED COURSE:  Pertinent labs & imaging results that  were available during my care of the patient were reviewed by me and considered in my medical decision making (see chart for details). Patient is in no acute distress, appears to have peripheral vertigo, we'll start with meclizine, Valium, Zofran and give IV fluid bolus. ____________________________________________    LABS (pertinent positives/negatives)  Labs Reviewed  CBC - Abnormal; Notable for the following:    RDW 16.1 (*)    All other components within normal limits  BASIC METABOLIC PANEL  TROPONIN I   ___________________________________________  FINAL ASSESSMENT AND PLAN  Vertigo  Plan: Patient with labs and imaging as dictated above. Patient with acute peripheral vertigo, feeling better after the above medications. I did not think she needed a head CT at this time. She'll have medications at home to take for vertigo and will be referred to ENT for follow-up. Emily FilbertWilliams, Jonathan E, MD   Emily FilbertJonathan E Williams, MD 05/14/15 [phone]

## 2015-05-25 ENCOUNTER — Ambulatory Visit: Payer: BC Managed Care – PPO | Attending: Pain Medicine | Admitting: Pain Medicine

## 2015-05-25 ENCOUNTER — Encounter: Payer: Self-pay | Admitting: Pain Medicine

## 2015-05-25 VITALS — BP 128/77 | HR 86 | Temp 98.0°F | Resp 16 | Ht 60.0 in | Wt 210.0 lb

## 2015-05-25 DIAGNOSIS — M5481 Occipital neuralgia: Secondary | ICD-10-CM | POA: Diagnosis not present

## 2015-05-25 DIAGNOSIS — M533 Sacrococcygeal disorders, not elsewhere classified: Secondary | ICD-10-CM

## 2015-05-25 DIAGNOSIS — M79605 Pain in left leg: Secondary | ICD-10-CM | POA: Diagnosis present

## 2015-05-25 DIAGNOSIS — M545 Low back pain: Secondary | ICD-10-CM | POA: Diagnosis present

## 2015-05-25 DIAGNOSIS — G43909 Migraine, unspecified, not intractable, without status migrainosus: Secondary | ICD-10-CM | POA: Insufficient documentation

## 2015-05-25 DIAGNOSIS — M5136 Other intervertebral disc degeneration, lumbar region: Secondary | ICD-10-CM | POA: Insufficient documentation

## 2015-05-25 DIAGNOSIS — M47816 Spondylosis without myelopathy or radiculopathy, lumbar region: Secondary | ICD-10-CM

## 2015-05-25 DIAGNOSIS — M79604 Pain in right leg: Secondary | ICD-10-CM | POA: Diagnosis present

## 2015-05-25 MED ORDER — HYDROCODONE-ACETAMINOPHEN 10-325 MG PO TABS: ORAL_TABLET | ORAL | Status: DC

## 2015-05-25 NOTE — Patient Instructions (Addendum)
Continue present medication hydrocodone acetaminophen  Block of nerves to the sacroiliac joint to be performed at time return appointment  F/U PCP  Dr.Bliss  for evaliation of  BP and general medical condition  F/U surgical evaluation. May consider pending follow-up evaluations  F/U neurological evaluation. May consider pending follow-up evaluations  May consider radiofrequency rhizolysis or intraspinal procedures pending response to present treatment and F/U evaluation Sacroiliac (SI) Joint Injection Patient Information  Description: The sacroiliac joint connects the scrum (very low back and tailbone) to the ilium (a pelvic bone which also forms half of the hip joint).  Normally this joint experiences very little motion.  When this joint becomes inflamed or unstable low back and or hip and pelvis pain may result.  Injection of this joint with local anesthetics (numbing medicines) and steroids can provide diagnostic information and reduce pain.  This injection is performed with the aid of x-ray guidance into the tailbone area while you are lying on your stomach.   You may experience an electrical sensation down the leg while this is being done.  You may also experience numbness.  We also may ask if we are reproducing your normal pain during the injection.  Conditions which may be treated SI injection:   Low back, buttock, hip or leg pain  Preparation for the Injection:  1. Do not eat any solid food or dairy products within 6 hours of your appointment.  2. You may drink clear liquids up to 2 hours before appointment.  Clear liquids include water, black coffee, juice or soda.  No milk or cream please. 3. You may take your regular medications, including pain medications with a sip of water before your appointment.  Diabetics should hold regular insulin (if take separately) and take 1/2 normal NPH dose the morning of the procedure.  Carry some sugar containing items with you to your  appointment. 4. A driver must accompany you and be prepared to drive you home after your procedure. 5. Bring all of your current medications with you. 6. An IV may be inserted and sedation may be given at the discretion of the physician. 7. A blood pressure cuff, EKG and other monitors will often be applied during the procedure.  Some patients may need to have extra oxygen administered for a short period.  8. You will be asked to provide medical information, including your allergies, prior to the procedure.  We must know immediately if you are taking blood thinners (like Coumadin/Warfarin) or if you are allergic to IV iodine contrast (dye).  We must know if you could possible be pregnant.  Possible side effects:   Bleeding from needle site  Infection (rare, may require surgery)  Nerve injury (rare)  Numbness & tingling (temporary)  A brief convulsion or seizure  Light-headedness (temporary)  Pain at injection site (several days)  Decreased blood pressure (temporary)  Weakness in the leg (temporary)   Call if you experience:   New onset weakness or numbness of an extremity below the injection site that last more than 8 hours.  Hives or difficulty breathing ( go to the emergency room)  Inflammation or drainage at the injection site  Any new symptoms which are concerning to you  Please note:  Although the local anesthetic injected can often make your back/ hip/ buttock/ leg feel good for several hours after the injections, the pain will likely return.  It takes 3-7 days for steroids to work in the sacroiliac area.  You may not notice  any pain relief for at least that one week.  If effective, we will often do a series of three injections spaced 3-6 weeks apart to maximally decrease your pain.  After the initial series, we generally will wait some months before a repeat injection of the same type.  If you have any questions, please call [phone] [facility]

## 2015-05-25 NOTE — Progress Notes (Signed)
   Subjective:    Patient ID: Mariah Reeves, female    DOB: [birth year], 49 y.o.   MRN: [id number]  HPI  The patient is a 49 year old female who returns to pain management Center for further evaluation and treatment of pain involving the lower back and lower extremity region. The patient has had significant improvement of prior interventional treatment in pain management Centerreturn of pain radiating from the lumbar region toward the buttocks on the left as well as on the right. Patient states the pain is aggravated by standing and walking. Patient's pain is also aggravated by turning over in bed. Patient denies any recent trauma change in events of daily living because no changes of the pathology. We discussed patient's overall condition and will continue medications and will proceed with block of nerves to the sacroiliac joint at time return appointment in attempt to decrease severity of patient's symptoms, minimize progression of symptoms, and avoid need for more involved treatment will proceed with what is felt to be medically necessary procedure block of nerves at the sacral electronic time return appointment as discussed and explained to patient on today's visit was with understanding and in agreement suggested treatment plan.    Review of Systems     Objective:   Physical Exam   There was mild tinnitus to palpation of the cervical facet cervical paraspinal muscular region. There was mild tenderness of the acromioclavicular and glenohumeral joint region. Patient appeared to be with bilaterally equal grip strength. Tinel and Phalen's maneuver were without increase of pain of significant degree. There was tenderness to palpation of the thoracic facet thoracic paraspinal misreading mild degree with palpation over the lower thoracic paraspinal must reason reproduced moderate discomfort. Palpation over the thoracic facet thoracic paraspinal must reason was attends to palpation with no crepitus of the  thoracic region noted. Palpation over the lumbar paraspinal misreading lumbar facet region was with moderate to moderately severe discomfort right as well as on the left with tenderness of the PSIS and piriformis musculature region. Straight leg raising was tolerates approximately 20 without increased pain with dorsiflexion noted. There was mild tenderness of the greater trochanteric region iliotibial band region. DTRs were difficult to this patient. Abdomen nontender with no costovertebral maintenance noted there was severe tenderness to palpation of the PSIS and PII S region. Palpation of these regions reproduced predominant portion of patient's pain. Patient appeared to be with positive Patrick's maneuver as well     Assessment & Plan:  Sacroiliac joint dysfunction  Degenerative disc disease lumbar spine  Lumbar facet syndrome  Bilateral occipital neuralgia  Migraine      PLAN   Continue present medication hydrocodone acetaminophen  Block of nerves to the sacroiliac joint to be performed at time return appointment  F/U PCP  Dr.Bliss  for evaliation of  BP and general medical condition  F/U surgical evaluation. May consider pending follow-up evaluations  F/U neurological evaluation. May consider pending follow-up evaluations  May consider radiofrequency rhizolysis or intraspinal procedures pending response to present treatment and F/U evaluation

## 2015-05-25 NOTE — Progress Notes (Signed)
Safety precautions to be maintained throughout the outpatient stay will include: orient to surroundings, keep bed in low position, maintain call bell within reach at all times, provide assistance with transfer out of bed and ambulation.  

## 2015-05-27 ENCOUNTER — Ambulatory Visit: Payer: BC Managed Care – PPO | Admitting: Neurology

## 2015-05-28 ENCOUNTER — Ambulatory Visit: Payer: BC Managed Care – PPO | Admitting: Neurology

## 2015-06-02 ENCOUNTER — Encounter: Payer: Self-pay | Admitting: Pain Medicine

## 2015-06-02 ENCOUNTER — Ambulatory Visit: Payer: BC Managed Care – PPO | Attending: Pain Medicine | Admitting: Pain Medicine

## 2015-06-02 VITALS — BP 107/61 | HR 101 | Temp 97.8°F | Resp 18 | Ht 60.0 in | Wt 210.0 lb

## 2015-06-02 DIAGNOSIS — M545 Low back pain: Secondary | ICD-10-CM | POA: Diagnosis present

## 2015-06-02 DIAGNOSIS — M5126 Other intervertebral disc displacement, lumbar region: Secondary | ICD-10-CM | POA: Insufficient documentation

## 2015-06-02 DIAGNOSIS — M533 Sacrococcygeal disorders, not elsewhere classified: Secondary | ICD-10-CM

## 2015-06-02 DIAGNOSIS — M79605 Pain in left leg: Secondary | ICD-10-CM | POA: Diagnosis present

## 2015-06-02 DIAGNOSIS — M47816 Spondylosis without myelopathy or radiculopathy, lumbar region: Secondary | ICD-10-CM | POA: Insufficient documentation

## 2015-06-02 DIAGNOSIS — M4806 Spinal stenosis, lumbar region: Secondary | ICD-10-CM | POA: Diagnosis not present

## 2015-06-02 DIAGNOSIS — M5481 Occipital neuralgia: Secondary | ICD-10-CM

## 2015-06-02 DIAGNOSIS — M79604 Pain in right leg: Secondary | ICD-10-CM | POA: Diagnosis present

## 2015-06-02 DIAGNOSIS — M5136 Other intervertebral disc degeneration, lumbar region: Secondary | ICD-10-CM

## 2015-06-02 MED ORDER — BUPIVACAINE HCL (PF) 0.25 % IJ SOLN
INTRAMUSCULAR | Status: AC
  Administered 2015-06-02: 13:00:00
  Filled 2015-06-02: qty 30

## 2015-06-02 MED ORDER — MIDAZOLAM HCL 5 MG/5ML IJ SOLN
INTRAMUSCULAR | Status: AC
  Administered 2015-06-02: 5 mg via INTRAVENOUS
  Filled 2015-06-02: qty 5

## 2015-06-02 MED ORDER — BUPIVACAINE HCL (PF) 0.25 % IJ SOLN: 30.0000 mL | Freq: Once | INTRAMUSCULAR | Status: DC

## 2015-06-02 MED ORDER — FENTANYL CITRATE (PF) 100 MCG/2ML IJ SOLN: 100.0000 ug | Freq: Once | INTRAMUSCULAR | Status: DC

## 2015-06-02 MED ORDER — TRIAMCINOLONE ACETONIDE 40 MG/ML IJ SUSP
INTRAMUSCULAR | Status: AC
  Administered 2015-06-02: 13:00:00
  Filled 2015-06-02: qty 1

## 2015-06-02 MED ORDER — FENTANYL CITRATE (PF) 100 MCG/2ML IJ SOLN
INTRAMUSCULAR | Status: AC
  Administered 2015-06-02: 100 ug via INTRAVENOUS
  Filled 2015-06-02: qty 2

## 2015-06-02 MED ORDER — MIDAZOLAM HCL 5 MG/5ML IJ SOLN: 5.0000 mg | Freq: Once | INTRAMUSCULAR | Status: DC

## 2015-06-02 MED ORDER — TRIAMCINOLONE ACETONIDE 40 MG/ML IJ SUSP: 40.0000 mg | Freq: Once | INTRAMUSCULAR | Status: DC

## 2015-06-02 MED ORDER — ORPHENADRINE CITRATE 30 MG/ML IJ SOLN: 60.0000 mg | Freq: Once | INTRAMUSCULAR | Status: DC

## 2015-06-02 MED ORDER — ORPHENADRINE CITRATE 30 MG/ML IJ SOLN
INTRAMUSCULAR | Status: AC
  Filled 2015-06-02: qty 2

## 2015-06-02 MED ORDER — LACTATED RINGERS IV SOLN: 1000.0000 mL | INTRAVENOUS | Status: DC

## 2015-06-02 NOTE — Patient Instructions (Addendum)
   PLAN   Continue present medication hydrocodone acetaminophen  F/U PCP  Dr.Bliss  for evaliation of  BP and general medical  condition  F/U surgical evaluation. May consider pending follow-up evaluations  F/U neurological evaluation. May consider  PNCV/EmG tdiespendg follow-up evaluations  May consider radiofrequency rhizolysis or intraspinal procedures pending response to present treatment and F/U evaluation   Patient to call [facility] Management Center should patient have concerns prior to scheduled return appointment  Pain Management Discharge Instructions  General Discharge Instructions :  If you need to reach your doctor call: Monday-Friday 8:00 am - 4:00 pm at [phone] or toll free [phone].  After clinic hours [phone] to have operator reach doctor.  Bring all of your medication bottles to all your appointments in the pain clinic.  To cancel or reschedule your appointment with Pain Management please remember to call 24 hours in advance to avoid a fee.  Refer to the educational materials which you have been given on: General Risks, I had my Procedure. Discharge Instructions, Post Sedation.  Post Procedure Instructions:  The drugs you were given will stay in your system until tomorrow, so for the next 24 hours you should not drive, make any legal decisions or drink any alcoholic beverages.  You may eat anything you prefer, but it is better to start with liquids then soups and crackers, and gradually work up to solid foods.  Please notify your doctor immediately if you have any unusual bleeding, trouble breathing or pain that is not related to your normal pain.  Depending on the type of procedure that was done, some parts of your body may feel week and/or numb.  This usually clears up by tonight or the next day.  Walk with the use of an assistive device or accompanied by an adult for the 24 hours.  You may use ice on the affected area for the first 24 hours.   Put ice in a Ziploc bag and cover with a towel and place against area 15 minutes on 15 minutes off.  You may switch to heat after 24 hours.

## 2015-06-02 NOTE — Progress Notes (Signed)
Safety precautions to be maintained throughout the outpatient stay will include: orient to surroundings, keep bed in low position, maintain call bell within reach at all times, provide assistance with transfer out of bed and ambulation.  

## 2015-06-02 NOTE — Progress Notes (Signed)
Subjective:    Patient ID: Mariah Reeves, female    DOB: [birth year], 49 y.o.   MRN: [id number]  HPI  PROCEDURE:  Block of nerves to the sacroiliac joint.   NOTE:  The patient is a 49 y.o. female who returns to the [facility] Management Center for further evaluation and treatment of pain involving the lower back and lower extremity region with pain in the region of the buttocks as well. 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 We will proceed with what is felt to be a medically necessary procedure, block of nerves to the sacroiliac joint.   DESCRIPTION OF PROCEDURE:  Block of nerves to the sacroiliac joint.   The patient was taken to the fluoroscopy suite. With the patient in the prone position with EKG, blood pressure, pulse and pulse oximetry monitoring, IV Versed, IV fentanyl conscious sedation, Betadine prep of proposed entry site was performed.   Block of nerves at the L5 vertebral body level.   With the patient in prone position, under fluoroscopic guidance, a 22 -gauge needle was inserted at the L5 vertebral body level on the left side. With 15 degrees oblique orientation a 22 -gauge needle was inserted in the region known as Burton's eye or eye of the Scotty dog. Following documentation of  needle placement in the area of Burton's eye or eye of the Scotty dog under fluoroscopic guidance, needle placement was then accomplished at the sacral ala level on the left side.   Needle placement at the sacral ala.   With the patient in prone position under fluoroscopic guidance with AP view of the lumbosacral spine, a 22 -gauge needle was inserted in the region known as the sacral ala on the left side. Following documentation of needle placement on the left side under fluoroscopic guidance needle placement was then accomplished at the S1 foramen level.   Needle placement at the S1 foramen level.   With the patient in prone position under fluoroscopic guidance with AP view of the lumbosacral spine and cephalad orientation, a 22 -gauge needle was inserted at the superior and lateral border of the S1 foramen on the left side. Following documentation of needle placement at the S1 foramen level on the left side, needle placement was then accomplished at the S2 foramen level on the left side.   Needle placement at the S2 foramen level.   With the patient in prone position with AP view of the lumbosacral spine with cephalad orientation, a 22 - gauge needle was inserted at the superior and lateral border of the S2 foramen under fluoroscopic guidance on the left side. Following needle placement at the L5 vertebral body level, sacral ala, S1 foramen and S2 foramen on the left side, needle placement was verified on lateral view under fluoroscopic guidance.  Following needle placement documentation on lateral view, each needle  was injected with 1 mL of 0.25% bupivacaine and Kenalog.   BLOCK OF THE NERVES TO SACROILIAC JOINT ON THE RIGHT SIDE The procedure was performed on the right side at the same levels as was performed on the left side and utilizing the same technique as on the left side and was performed under fluoroscopic guidance as on the left side   A total of 10mg  of Kenalog was utilized for the  procedure.   PLAN:  1. Medications: The patient will continue presently prescribed medication hydrocodone acetaminophen 2. The patient will be considered for modification of treatment regimen pending response to the procedure performed on today's visit.  3. The patient is to follow-up with primary care physician Dr. Quillian QuinceBliss for evaluation of blood pressure and general medical condition following the procedure performed on today's visit.  4. Surgical evaluation as discussed.  5. Neurological evaluation as discussed.  6. The patient may be a candidate for radiofrequency procedures, implantation devices and other treatment pending response to treatment performed on today's visit and follow-up evaluation.  7. The patient has been advised to adhere to proper body mechanics and to avoid activities which may exacerbate the patient's symptoms.   Return appointment to Pain Management Center as scheduled.    Review of Systems     Objective:   Physical Exam        Assessment & Plan:

## 2015-06-03 ENCOUNTER — Telehealth: Payer: Self-pay

## 2015-06-03 NOTE — Telephone Encounter (Signed)
Post procedure call - Left message to call if any questions or concerns. 

## 2015-06-15 ENCOUNTER — Telehealth: Payer: Self-pay | Admitting: Pain Medicine

## 2015-06-15 NOTE — Telephone Encounter (Signed)
Nurses Please call patient so that I can speak with the patient this morning

## 2015-06-15 NOTE — Telephone Encounter (Signed)
HAS SOMETHING SHE WANTS TO DISCUSS WITH DR CRISP / NOT GOOD NEWS

## 2015-06-15 NOTE — Telephone Encounter (Signed)
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 She does not now date of surgery yet. [name] has an appointment with Dr Metta Clinesrisp next week and will give him further information.

## 2015-06-24 ENCOUNTER — Ambulatory Visit: Payer: BC Managed Care – PPO | Attending: Pain Medicine | Admitting: Pain Medicine

## 2015-06-24 ENCOUNTER — Encounter: Payer: Self-pay | Admitting: Pain Medicine

## 2015-06-24 VITALS — BP 152/95 | HR 89 | Temp 97.7°F | Resp 15 | Ht 60.0 in | Wt 219.0 lb

## 2015-06-24 DIAGNOSIS — M5481 Occipital neuralgia: Secondary | ICD-10-CM | POA: Diagnosis not present

## 2015-06-24 DIAGNOSIS — M545 Low back pain: Secondary | ICD-10-CM | POA: Diagnosis present

## 2015-06-24 DIAGNOSIS — M533 Sacrococcygeal disorders, not elsewhere classified: Secondary | ICD-10-CM | POA: Insufficient documentation

## 2015-06-24 DIAGNOSIS — M47816 Spondylosis without myelopathy or radiculopathy, lumbar region: Secondary | ICD-10-CM

## 2015-06-24 DIAGNOSIS — M79606 Pain in leg, unspecified: Secondary | ICD-10-CM | POA: Diagnosis present

## 2015-06-24 DIAGNOSIS — G43909 Migraine, unspecified, not intractable, without status migrainosus: Secondary | ICD-10-CM | POA: Diagnosis not present

## 2015-06-24 DIAGNOSIS — M5136 Other intervertebral disc degeneration, lumbar region: Secondary | ICD-10-CM | POA: Insufficient documentation

## 2015-06-24 MED ORDER — HYDROCODONE-ACETAMINOPHEN 10-325 MG PO TABS: ORAL_TABLET | ORAL | Status: DC

## 2015-06-24 NOTE — Patient Instructions (Addendum)
Continue present medication hydrocodone acetaminophen  May consider block of nerves to sacroiliac joint. At the present time you will return for evaluation only. We may also consider radiofrequency procedures. Ask the nurses for radiofrequency procedure information  F/U PCP  Dr.Bliss  for evaliation of  BP and general medical condition  F/U surgical evaluation. May consider pending follow-up evaluations  F/U neurological evaluation. May consider pending follow-up evaluations  May consider radiofrequency rhizolysis or intraspinal procedures pending response to present treatment and F/U evaluation ask nurses for radiofrequency handout   Patient to call pain management prior to scheduled return appointment should there be change in condition or should patient have concerns prior to scheduled return appointmentSacroiliac (SI) Joint Injection Patient Information  Description: The sacroiliac joint connects the scrum (very low back and tailbone) to the ilium (a pelvic bone which also forms half of the hip joint).  Normally this joint experiences very little motion.  When this joint becomes inflamed or unstable low back and or hip and pelvis pain may result.  Injection of this joint with local anesthetics (numbing medicines) and steroids can provide diagnostic information and reduce pain.  This injection is performed with the aid of x-ray guidance into the tailbone area while you are lying on your stomach.   You may experience an electrical sensation down the leg while this is being done.  You may also experience numbness.  We also may ask if we are reproducing your normal pain during the injection.  Conditions which may be treated SI injection:   Low back, buttock, hip or leg pain  Preparation for the Injection:  1. Do not eat any solid food or dairy products within 6 hours of your appointment.  2. You may drink clear liquids up to 2 hours before appointment.  Clear liquids include water, black  coffee, juice or soda.  No milk or cream please. 3. You may take your regular medications, including pain medications with a sip of water before your appointment.  Diabetics should hold regular insulin (if take separately) and take 1/2 normal NPH dose the morning of the procedure.  Carry some sugar containing items with you to your appointment. 4. A driver must accompany you and be prepared to drive you home after your procedure. 5. Bring all of your current medications with you. 6. An IV may be inserted and sedation may be given at the discretion of the physician. 7. A blood pressure cuff, EKG and other monitors will often be applied during the procedure.  Some patients may need to have extra oxygen administered for a short period.  8. You will be asked to provide medical information, including your allergies, prior to the procedure.  We must know immediately if you are taking blood thinners (like Coumadin/Warfarin) or if you are allergic to IV iodine contrast (dye).  We must know if you could possible be pregnant.  Possible side effects:   Bleeding from needle site  Infection (rare, may require surgery)  Nerve injury (rare)  Numbness & tingling (temporary)  A brief convulsion or seizure  Light-headedness (temporary)  Pain at injection site (several days)  Decreased blood pressure (temporary)  Weakness in the leg (temporary)   Call if you experience:   New onset weakness or numbness of an extremity below the injection site that last more than 8 hours.  Hives or difficulty breathing ( go to the emergency room)  Inflammation or drainage at the injection site  Any new symptoms which are concerning to  you  Please note:  Although the local anesthetic injected can often make your back/ hip/ buttock/ leg feel good for several hours after the injections, the pain will likely return.  It takes 3-7 days for steroids to work in the sacroiliac area.  You may not notice any pain relief  for at least that one week.  If effective, we will often do a series of three injections spaced 3-6 weeks apart to maximally decrease your pain.  After the initial series, we generally will wait some months before a repeat injection of the same type.  If you have any questions, please call [phone] [facility]  Pain Management Discharge Instructions  General Discharge Instructions :  If you need to reach your doctor call: Monday-Friday 8:00 am - 4:00 pm at [phone] or toll free [phone].  After clinic hours [phone] to have operator reach doctor.  Bring all of your medication bottles to all your appointments in the pain clinic.  To cancel or reschedule your appointment with Pain Management please remember to call 24 hours in advance to avoid a fee.  Refer to the educational materials which you have been given on: General Risks, I had my Procedure. Discharge Instructions, Post Sedation.  Post Procedure Instructions:  The drugs you were given will stay in your system until tomorrow, so for the next 24 hours you should not drive, make any legal decisions or drink any alcoholic beverages.  You may eat anything you prefer, but it is better to start with liquids then soups and crackers, and gradually work up to solid foods.  Please notify your doctor immediately if you have any unusual bleeding, trouble breathing or pain that is not related to your normal pain.  Depending on the type of procedure that was done, some parts of your body may feel week and/or numb.  This usually clears up by tonight or the next day.  Walk with the use of an assistive device or accompanied by an adult for the 24 hours.  You may use ice on the affected area for the first 24 hours.  Put ice in a Ziploc bag and cover with a towel and place against area 15 minutes on 15 minutes off.  You may switch to heat after 24 hours.GENERAL RISKS AND COMPLICATIONS  What are  the risk, side effects and possible complications? Generally speaking, most procedures are safe.  However, with any procedure there are risks, side effects, and the possibility of complications.  The risks and complications are dependent upon the sites that are lesioned, or the type of nerve block to be performed.  The closer the procedure is to the spine, the more serious the risks are.  Great care is taken when placing the radio frequency needles, block needles or lesioning probes, but sometimes complications can occur. 1. Infection: Any time there is an injection through the skin, there is a risk of infection.  This is why sterile conditions are used for these blocks.  There are four possible types of infection. 1. Localized skin infection. 2. Central Nervous System Infection-This can be in the form of Meningitis, which can be deadly. 3. Epidural Infections-This can be in the form of an epidural abscess, which can cause pressure inside of the spine, causing compression of the spinal cord with subsequent paralysis. This would require an emergency surgery to decompress, and there are no guarantees that the patient would recover from the paralysis. 4. Discitis-This is an infection of  the intervertebral discs.  It occurs in about 1% of discography procedures.  It is difficult to treat and it may lead to surgery.        2. Pain: the needles have to go through skin and soft tissues, will cause soreness.       3. Damage to internal structures:  The nerves to be lesioned may be near blood vessels or    other nerves which can be potentially damaged.       4. Bleeding: Bleeding is more common if the patient is taking blood thinners such as  aspirin, Coumadin, Ticiid, Plavix, etc., or if he/she have some genetic predisposition  such as hemophilia. Bleeding into the spinal canal can cause compression of the spinal  cord with subsequent paralysis.  This would require an emergency surgery to  decompress and there  are no guarantees that the patient would recover from the  paralysis.       5. Pneumothorax:  Puncturing of a lung is a possibility, every time a needle is introduced in  the area of the chest or upper back.  Pneumothorax refers to free air around the  collapsed lung(s), inside of the thoracic cavity (chest cavity).  Another two possible  complications related to a similar event would include: Hemothorax and Chylothorax.   These are variations of the Pneumothorax, where instead of air around the collapsed  lung(s), you may have blood or chyle, respectively.       6. Spinal headaches: They may occur with any procedures in the area of the spine.       7. Persistent CSF (Cerebro-Spinal Fluid) leakage: This is a rare problem, but may occur  with prolonged intrathecal or epidural catheters either due to the formation of a fistulous  track or a dural tear.       8. Nerve damage: By working so close to the spinal cord, there is always a possibility of  nerve damage, which could be as serious as a permanent spinal cord injury with  paralysis.       9. Death:  Although rare, severe deadly allergic reactions known as "Anaphylactic  reaction" can occur to any of the medications used.      10. Worsening of the symptoms:  We can always make thing worse.  What are the chances of something like this happening? Chances of any of this occuring are extremely low.  By statistics, you have more of a chance of getting killed in a motor vehicle accident: while driving to the hospital than any of the above occurring .  Nevertheless, you should be aware that they are possibilities.  In general, it is similar to taking a shower.  Everybody knows that you can slip, hit your head and get killed.  Does that mean that you should not shower again?  Nevertheless always keep in mind that statistics do not mean anything if you happen to be on the wrong side of them.  Even if a procedure has a 1 (one) in a 1,000,000 (million) chance of  going wrong, it you happen to be that one..Also, keep in mind that by statistics, you have more of a chance of having something go wrong when taking medications.  Who should not have this procedure? If you are on a blood thinning medication (e.g. Coumadin, Plavix, see list of "Blood Thinners"), or if you have an active infection going on, you should not have the procedure.  If you are taking any blood thinners,  please inform your physician.  How should I prepare for this procedure?  Do not eat or drink anything at least six hours prior to the procedure.  Bring a driver with you .  It cannot be a taxi.  Come accompanied by an adult that can drive you back, and that is strong enough to help you if your legs get weak or numb from the local anesthetic.  Take all of your medicines the morning of the procedure with just enough water to swallow them.  If you have diabetes, make sure that you are scheduled to have your procedure done first thing in the morning, whenever possible.  If you have diabetes, take only half of your insulin dose and notify our nurse that you have done so as soon as you arrive at the clinic.  If you are diabetic, but only take blood sugar pills (oral hypoglycemic), then do not take them on the morning of your procedure.  You may take them after you have had the procedure.  Do not take aspirin or any aspirin-containing medications, at least eleven (11) days prior to the procedure.  They may prolong bleeding.  Wear loose fitting clothing that may be easy to take off and that you would not mind if it got stained with Betadine or blood.  Do not wear any jewelry or perfume  Remove any nail coloring.  It will interfere with some of our monitoring equipment.  NOTE: Remember that this is not meant to be interpreted as a complete list of all possible complications.  Unforeseen problems may occur.  BLOOD THINNERS The following drugs contain aspirin or other products, which can  cause increased bleeding during surgery and should not be taken for 2 weeks prior to and 1 week after surgery.  If you should need take something for relief of minor pain, you may take acetaminophen which is found in Tylenol,m Datril, Anacin-3 and Panadol. It is not blood thinner. The products listed below are.  Do not take any of the products listed below in addition to any listed on your instruction sheet.  A.P.C or A.P.C with Codeine Codeine Phosphate Capsules #3 Ibuprofen Ridaura  ABC compound Congesprin Imuran rimadil  Advil Cope Indocin Robaxisal  Alka-Seltzer Effervescent Pain Reliever and Antacid Coricidin or Coricidin-D  Indomethacin Rufen  Alka-Seltzer plus Cold Medicine Cosprin Ketoprofen S-A-C Tablets  Anacin Analgesic Tablets or Capsules Coumadin Korlgesic Salflex  Anacin Extra Strength Analgesic tablets or capsules CP-2 Tablets Lanoril Salicylate  Anaprox Cuprimine Capsules Levenox Salocol  Anexsia-D Dalteparin Magan Salsalate  Anodynos Darvon compound Magnesium Salicylate Sine-off  Ansaid Dasin Capsules Magsal Sodium Salicylate  Anturane Depen Capsules Marnal Soma  APF Arthritis pain formula Dewitt's Pills Measurin Stanback  Argesic Dia-Gesic Meclofenamic Sulfinpyrazone  Arthritis Bayer Timed Release Aspirin Diclofenac Meclomen Sulindac  Arthritis pain formula Anacin Dicumarol Medipren Supac  Analgesic (Safety coated) Arthralgen Diffunasal Mefanamic Suprofen  Arthritis Strength Bufferin Dihydrocodeine Mepro Compound Suprol  Arthropan liquid Dopirydamole Methcarbomol with Aspirin Synalgos  ASA tablets/Enseals Disalcid Micrainin Tagament  Ascriptin Doan's Midol Talwin  Ascriptin A/D Dolene Mobidin Tanderil  Ascriptin Extra Strength Dolobid Moblgesic Ticlid  Ascriptin with Codeine Doloprin or Doloprin with Codeine Momentum Tolectin  Asperbuf Duoprin Mono-gesic Trendar  Aspergum Duradyne Motrin or Motrin IB Triminicin  Aspirin plain, buffered or enteric coated Durasal  Myochrisine Trigesic  Aspirin Suppositories Easprin Nalfon Trillsate  Aspirin with Codeine Ecotrin Regular or Extra Strength Naprosyn Uracel  Atromid-S Efficin Naproxen Ursinus  Auranofin Capsules Elmiron Neocylate Vanquish  Axotal  Emagrin Norgesic Verin  Azathioprine Empirin or Empirin with Codeine Normiflo Vitamin E  Azolid Emprazil Nuprin Voltaren  Bayer Aspirin plain, buffered or children's or timed BC Tablets or powders Encaprin Orgaran Warfarin Sodium  Buff-a-Comp Enoxaparin Orudis Zorpin  Buff-a-Comp with Codeine Equegesic Os-Cal-Gesic   Buffaprin Excedrin plain, buffered or Extra Strength Oxalid   Bufferin Arthritis Strength Feldene Oxphenbutazone   Bufferin plain or Extra Strength Feldene Capsules Oxycodone with Aspirin   Bufferin with Codeine Fenoprofen Fenoprofen Pabalate or Pabalate-SF   Buffets II Flogesic Panagesic   Buffinol plain or Extra Strength Florinal or Florinal with Codeine Panwarfarin   Buf-Tabs Flurbiprofen Penicillamine   Butalbital Compound Four-way cold tablets Penicillin   Butazolidin Fragmin Pepto-Bismol   Carbenicillin Geminisyn Percodan   Carna Arthritis Reliever Geopen Persantine   Carprofen Gold's salt Persistin   Chloramphenicol Goody's Phenylbutazone   Chloromycetin Haltrain Piroxlcam   Clmetidine heparin Plaquenil   Cllnoril Hyco-pap Ponstel   Clofibrate Hydroxy chloroquine Propoxyphen         Before stopping any of these medications, be sure to consult the physician who ordered them.  Some, such as Coumadin (Warfarin) are ordered to prevent or treat serious conditions such as "deep thrombosis", "pumonary embolisms", and other heart problems.  The amount of time that you may need off of the medication may also vary with the medication and the reason for which you were taking it.  If you are taking any of these medications, please make sure you notify your pain physician before you undergo any procedures.

## 2015-06-24 NOTE — Progress Notes (Signed)
Safety precautions to be maintained throughout the outpatient stay will include: orient to surroundings, keep bed in low position, maintain call bell within reach at all times, provide assistance with transfer out of bed and ambulation.  

## 2015-06-24 NOTE — Progress Notes (Signed)
   Subjective:    Patient ID: Mariah Reeves, female    DOB: 1965-11-29, 50 y.o.   MRN: [id number]  HPI The patient is a 50 year old female who returns to pain management for further evaluation and treatment of pain involving the mid lower back and lower extremity region. The patient is with pain which she states has improved with previous procedure including block of nerves to the sacroiliac joint as well as lumbar facet, medial branch nerve, blocks. The patient states the pain is aggravated by standing walking twisting turning maneuvers pain becomes more intense than in the day and patient also has difficulty climbing stairs. There is concern regarding sacroiliac joint dysfunction as well as lumbar facet involvement contributing to some patient's symptomatology. We will continue medication hydrocodone acetaminophen. Patient will undergo GYN follow-up evaluation and treatment and we will consider additional interventional treatment pending follow-up evaluation of patient. The patient was understanding and agreement with suggested treatment plan. The patient may be candidate for radiofrequency rhizolysis lumbar facet, medial branch nerves,. We have discussed such treatment and will await patient's response to GYN treatment prior to proceeding with treatment. All agreed to suggested treatment plan   Review of Systems     Objective:   Physical Exam  There was tenderness of the paraspinal muscular region cervical region cervical facet region a mild degree with mild tenderness of the splenius capitis and occipitalis musculature regions. There was mild tenderness of the acromioclavicular and glenohumeral joint regions. Patient appeared to be unremarkable Spurling's maneuver. Palpation over the thoracic facet thoracic paraspinal must reason was attends to palpation of mild degree of the thoracic region and moderate degree the lower thoracic region without crepitus of the thoracic region noted. Palpation over the  lumbar paraspinal muscles lumbar facet region was with moderate to moderately severe tenderness to palpation with moderate to moderately severe tenderness of the PSIS and PII S region as well as the gluteal and piriformis musculature regions. There was mild tenderness of the greater trochanteric region iliotibial band region. Straight leg raise was tolerates approximately 20 without increased pain with dorsiflexion noted. There appeared to be negative clonus negative Homans. Palpation over the PSIS and PII S region reproduced moderate to moderately severe tends to palpation upon reevaluation. No sensory deficit dermatomal dystrophy detected. EHL strength appeared to be decreased. There was negative clonus negative Homans. Abdomen without excessive tends to palpation and no costovertebral tenderness noted    Assessment & Plan:     Sacroiliac joint dysfunction  Degenerative disc disease lumbar spine  Lumbar facet syndrome  Bilateral occipital neuralgia  Migraine    PLAN  Continue present medication hydrocodone acetaminophen  May consider block of nerves to sacroiliac joint. At the present time you will return for evaluation only. We may also consider radiofrequency procedures. Ask the nurses for radiofrequency procedure information  F/U PCP  Dr.Bliss  for evaliation of  BP and general medical condition  F/U surgical evaluation. May consider pending follow-up evaluations  F/U neurological evaluation. May consider pending follow-up evaluations  GYN evaluation as planned  May consider radiofrequency rhizolysis or intraspinal procedures pending response to present treatment and F/U evaluation ask nurses for radiofrequency handout   Patient to call pain management prior to scheduled return appointment should there be change in condition or should patient have concerns prior to scheduled return appointment

## 2015-07-06 ENCOUNTER — Other Ambulatory Visit: Payer: Self-pay | Admitting: Pain Medicine

## 2015-07-08 ENCOUNTER — Ambulatory Visit: Payer: BC Managed Care – PPO | Admitting: Anesthesiology

## 2015-07-08 ENCOUNTER — Encounter: Admission: RE | Payer: Self-pay | Source: Ambulatory Visit

## 2015-07-08 ENCOUNTER — Encounter: Payer: Self-pay | Admitting: *Deleted

## 2015-07-08 ENCOUNTER — Encounter
Admission: RE | Disposition: A | Discharge status: Home / Self Care | Payer: Self-pay | Source: Ambulatory Visit | Attending: Obstetrics & Gynecology

## 2015-07-08 ENCOUNTER — Ambulatory Visit
Admission: RE | Admit: 2015-07-08 | Discharge: 2015-07-08 | Disposition: A | Discharge status: Home / Self Care | Payer: BC Managed Care – PPO | Source: Ambulatory Visit | Attending: Obstetrics & Gynecology | Admitting: Obstetrics & Gynecology

## 2015-07-08 ENCOUNTER — Ambulatory Visit
Admission: RE | Admit: 2015-07-08 | Payer: BC Managed Care – PPO | Source: Ambulatory Visit | Admitting: Obstetrics & Gynecology

## 2015-07-08 DIAGNOSIS — N939 Abnormal uterine and vaginal bleeding, unspecified: Secondary | ICD-10-CM | POA: Diagnosis not present

## 2015-07-08 DIAGNOSIS — Z803 Family history of malignant neoplasm of breast: Secondary | ICD-10-CM | POA: Insufficient documentation

## 2015-07-08 DIAGNOSIS — Z91041 Radiographic dye allergy status: Secondary | ICD-10-CM | POA: Diagnosis not present

## 2015-07-08 DIAGNOSIS — Z853 Personal history of malignant neoplasm of breast: Secondary | ICD-10-CM | POA: Diagnosis not present

## 2015-07-08 DIAGNOSIS — N920 Excessive and frequent menstruation with regular cycle: Secondary | ICD-10-CM | POA: Insufficient documentation

## 2015-07-08 DIAGNOSIS — Z8249 Family history of ischemic heart disease and other diseases of the circulatory system: Secondary | ICD-10-CM | POA: Diagnosis not present

## 2015-07-08 DIAGNOSIS — Z808 Family history of malignant neoplasm of other organs or systems: Secondary | ICD-10-CM | POA: Diagnosis not present

## 2015-07-08 DIAGNOSIS — N840 Polyp of corpus uteri: Secondary | ICD-10-CM | POA: Diagnosis not present

## 2015-07-08 DIAGNOSIS — Z79899 Other long term (current) drug therapy: Secondary | ICD-10-CM | POA: Insufficient documentation

## 2015-07-08 DIAGNOSIS — Z9884 Bariatric surgery status: Secondary | ICD-10-CM | POA: Insufficient documentation

## 2015-07-08 DIAGNOSIS — F319 Bipolar disorder, unspecified: Secondary | ICD-10-CM | POA: Insufficient documentation

## 2015-07-08 DIAGNOSIS — F419 Anxiety disorder, unspecified: Secondary | ICD-10-CM | POA: Diagnosis not present

## 2015-07-08 DIAGNOSIS — N859 Noninflammatory disorder of uterus, unspecified: Secondary | ICD-10-CM | POA: Diagnosis present

## 2015-07-08 DIAGNOSIS — N949 Unspecified condition associated with female genital organs and menstrual cycle: Secondary | ICD-10-CM | POA: Diagnosis present

## 2015-07-08 HISTORY — PX: DILATATION & CURETTAGE/HYSTEROSCOPY WITH MYOSURE: SHX6511

## 2015-07-08 LAB — POCT PREGNANCY, URINE: Preg Test, Ur: NEGATIVE

## 2015-07-08 LAB — ABO/RH: ABO/RH(D): A POS

## 2015-07-08 LAB — CBC
HCT: 37 % (ref 35.0–47.0)
Hemoglobin: 12.4 g/dL (ref 12.0–16.0)
MCH: 28 pg (ref 26.0–34.0)
MCHC: 33.6 g/dL (ref 32.0–36.0)
MCV: 83.6 fL (ref 80.0–100.0)
PLATELETS: 284 10*3/uL (ref 150–440)
RBC: 4.43 MIL/uL (ref 3.80–5.20)
RDW: 14.6 % — AB (ref 11.5–14.5)
WBC: 4.5 10*3/uL (ref 3.6–11.0)

## 2015-07-08 LAB — TYPE AND SCREEN
ABO/RH(D): A POS
ANTIBODY SCREEN: NEGATIVE

## 2015-07-08 SURGERY — DILATATION & CURETTAGE/HYSTEROSCOPY WITH MYOSURE
Anesthesia: Choice

## 2015-07-08 SURGERY — DILATATION & CURETTAGE/HYSTEROSCOPY WITH MYOSURE
Anesthesia: General

## 2015-07-08 MED ORDER — OXYCODONE-ACETAMINOPHEN 5-325 MG PO TABS: 1.0000 | ORAL_TABLET | ORAL | Status: DC | PRN

## 2015-07-08 MED ORDER — OXYCODONE HCL 5 MG/5ML PO SOLN: 5.0000 mg | Freq: Once | ORAL | Status: DC | PRN

## 2015-07-08 MED ORDER — FENTANYL CITRATE (PF) 100 MCG/2ML IJ SOLN
INTRAMUSCULAR | Status: DC | PRN
  Administered 2015-07-08 (×2): 50 ug via INTRAVENOUS

## 2015-07-08 MED ORDER — DEXAMETHASONE SODIUM PHOSPHATE 10 MG/ML IJ SOLN
INTRAMUSCULAR | Status: DC | PRN
  Administered 2015-07-08: 10 mg via INTRAVENOUS

## 2015-07-08 MED ORDER — FENTANYL CITRATE (PF) 100 MCG/2ML IJ SOLN
INTRAMUSCULAR | Status: AC
  Filled 2015-07-08: qty 2

## 2015-07-08 MED ORDER — LACTATED RINGERS IR SOLN
Status: DC | PRN
  Administered 2015-07-08: 500 mL

## 2015-07-08 MED ORDER — PROPOFOL 10 MG/ML IV BOLUS
INTRAVENOUS | Status: DC | PRN
  Administered 2015-07-08: 200 mg via INTRAVENOUS

## 2015-07-08 MED ORDER — MIDAZOLAM HCL 2 MG/2ML IJ SOLN
INTRAMUSCULAR | Status: DC | PRN
  Administered 2015-07-08: 1 mg via INTRAVENOUS

## 2015-07-08 MED ORDER — LACTATED RINGERS IV SOLN
INTRAVENOUS | Status: DC
  Administered 2015-07-08 (×2): via INTRAVENOUS

## 2015-07-08 MED ORDER — LIDOCAINE HCL (CARDIAC) 20 MG/ML IV SOLN
INTRAVENOUS | Status: DC | PRN
  Administered 2015-07-08: 30 mg via INTRAVENOUS

## 2015-07-08 MED ORDER — OXYCODONE HCL 5 MG PO TABS: 5.0000 mg | ORAL_TABLET | Freq: Once | ORAL | Status: DC | PRN

## 2015-07-08 MED ORDER — ONDANSETRON HCL 4 MG/2ML IJ SOLN
INTRAMUSCULAR | Status: DC | PRN
  Administered 2015-07-08: 4 mg via INTRAVENOUS

## 2015-07-08 MED ORDER — FENTANYL CITRATE (PF) 100 MCG/2ML IJ SOLN
25.0000 ug | INTRAMUSCULAR | Status: DC | PRN
  Administered 2015-07-08 (×4): 25 ug via INTRAVENOUS

## 2015-07-08 MED ORDER — SILVER NITRATE-POT NITRATE 75-25 % EX MISC
CUTANEOUS | Status: AC
  Filled 2015-07-08: qty 1

## 2015-07-08 SURGICAL SUPPLY — 25 items
ABLATOR ENDOMETRIAL MYOSURE (ABLATOR) ×3 IMPLANT
BAG COUNTER SPONGE EZ (MISCELLANEOUS) ×2 IMPLANT
BAG SPNG 4X4 CLR HAZ (MISCELLANEOUS) ×1
CANISTER SUC SOCK COL 7IN (MISCELLANEOUS) ×3 IMPLANT
CATH ROBINSON RED A/P 16FR (CATHETERS) ×3 IMPLANT
COUNTER SPONGE BAG EZ (MISCELLANEOUS) ×1
ELECT REM PT RETURN 9FT ADLT (ELECTROSURGICAL) ×3
ELECTRODE REM PT RTRN 9FT ADLT (ELECTROSURGICAL) ×1 IMPLANT
GLOVE BIO SURGEON STRL SZ8 (GLOVE) ×3 IMPLANT
GOWN STRL REUS W/ TWL LRG LVL3 (GOWN DISPOSABLE) ×1 IMPLANT
GOWN STRL REUS W/ TWL XL LVL3 (GOWN DISPOSABLE) ×1 IMPLANT
GOWN STRL REUS W/TWL LRG LVL3 (GOWN DISPOSABLE) ×3
GOWN STRL REUS W/TWL XL LVL3 (GOWN DISPOSABLE) ×3
MYOSURE LITE POLYP REMOVAL (MISCELLANEOUS) ×3 IMPLANT
PACK DNC HYST (MISCELLANEOUS) ×3 IMPLANT
PAD OB MATERNITY 4.3X12.25 (PERSONAL CARE ITEMS) ×3 IMPLANT
PAD PREP 24X41 OB/GYN DISP (PERSONAL CARE ITEMS) ×3 IMPLANT
SOL .9 NS 3000ML IRR  AL (IV SOLUTION)
SOL .9 NS 3000ML IRR AL (IV SOLUTION)
SOL .9 NS 3000ML IRR UROMATIC (IV SOLUTION) ×1 IMPLANT
STRAP SAFETY BODY (MISCELLANEOUS) ×3 IMPLANT
TOWEL OR 17X26 4PK STRL BLUE (TOWEL DISPOSABLE) ×3 IMPLANT
TUBING CONNECTING 10 (TUBING) ×2 IMPLANT
TUBING CONNECTING 10' (TUBING) ×1
TUBING HYSTEROSCOPY DOLPHIN (MISCELLANEOUS) ×3 IMPLANT

## 2015-07-08 NOTE — Anesthesia Preprocedure Evaluation (Signed)
Anesthesia Evaluation  Patient identified by MRN, date of birth, ID band Patient awake    Reviewed: Allergy & Precautions, H&P , NPO status , Patient's Chart, lab work & pertinent test results  History of Anesthesia Complications (+) PROLONGED EMERGENCE and history of anesthetic complications  Airway Mallampati: III  TM Distance: >3 FB Neck ROM: full    Dental  (+) Poor Dentition, Chipped   Pulmonary neg shortness of breath, sleep apnea ,    Pulmonary exam normal breath sounds clear to auscultation       Cardiovascular Exercise Tolerance: Good (-) angina(-) Past MI and (-) DOE negative cardio ROS Normal cardiovascular exam Rhythm:regular Rate:Normal     Neuro/Psych PSYCHIATRIC DISORDERS Depression Bipolar Disorder negative neurological ROS     GI/Hepatic negative GI ROS, Neg liver ROS, neg GERD  ,  Endo/Other  negative endocrine ROS  Renal/GU negative Renal ROS  negative genitourinary   Musculoskeletal  (+) Arthritis ,   Abdominal   Peds  Hematology negative hematology ROS (+)   Anesthesia Other Findings Past Medical History:   Depression                                                   Bipolar 1 disorder (HCC)                                     Allergy                                                      Anomaly, uterus                                                Comment:tumor of uterus  Past Surgical History:   GASTRIC BYPASS                                                KNEE SURGERY                                                  CHOLECYSTECTOMY                                               CARPAL TUNNEL RELEASE                           Left              NASAL SEPTUM SURGERY  MIDDLE EAR SURGERY                                           Patient reports multiple bruises from falling down the stairs   Reproductive/Obstetrics negative OB ROS                              Anesthesia Physical Anesthesia Plan  ASA: III  Anesthesia Plan: General LMA   Post-op Pain Management:    Induction:   Airway Management Planned:   Additional Equipment:   Intra-op Plan:   Post-operative Plan:   Informed Consent: I have reviewed the patients History and Physical, chart, labs and discussed the procedure including the risks, benefits and alternatives for the proposed anesthesia with the patient or authorized representative who has indicated his/her understanding and acceptance.   Dental Advisory Given  Plan Discussed with: Anesthesiologist, CRNA and Surgeon  Anesthesia Plan Comments:         Anesthesia Quick Evaluation

## 2015-07-08 NOTE — H&P (Signed)
History and Physical Interval Note:  07/08/2015 9:45 AM  Mariah Reeves  has presented today for surgery, with the diagnosis of  POLYP ENDOMETRIUM  The various methods of treatment have been discussed with the patient and family. After consideration of risks, benefits and other options for treatment, the patient has consented to  Procedure(s): DILATATION & CURETTAGE/HYSTEROSCOPY WITH MYOSURE (N/A) as a surgical intervention .  The patient's history has been reviewed, patient examined, no change in status, stable for surgery.  Pt has the following beta blocker history-  Not taking Beta Blocker.  I have reviewed the patient's chart and labs.  Questions were answered to the patient's satisfaction.    HARRIS,ROBERT Renae Fickle

## 2015-07-08 NOTE — Discharge Instructions (Signed)
Hysteroscopy, Care After °Refer to this sheet in the next few weeks. These instructions provide you with information on caring for yourself after your procedure. Your health care provider may also give you more specific instructions. Your treatment has been planned according to current medical practices, but problems sometimes occur. Call your health care provider if you have any problems or questions after your procedure.  °WHAT TO EXPECT AFTER THE PROCEDURE °After your procedure, it is typical to have the following: °· You may have some cramping. This normally lasts for a couple days. °· You may have bleeding. This can vary from light spotting for a few days to menstrual-like bleeding for 3-7 days. °HOME CARE INSTRUCTIONS °· Rest for the first 1-2 days after the procedure. °· Only take over-the-counter or prescription medicines as directed by your health care provider. Do not take aspirin. It can increase the chances of bleeding. °· Take showers instead of baths for 2 weeks or as directed by your health care provider. °· Do not drive for 24 hours or as directed. °· Do not drink alcohol while taking pain medicine. °· Do not use tampons, douche, or have sexual intercourse for 2 weeks or until your health care provider says it is okay. °· Take your temperature twice a day for 4-5 days. Write it down each time. °· Follow your health care provider's advice about diet, exercise, and lifting. °· If you develop constipation, you may: °¨ Take a mild laxative if your health care provider approves. °¨ Add bran foods to your diet. °¨ Drink enough fluids to keep your urine clear or pale yellow. °· Try to have someone with you or available to you for the first 24-48 hours, especially if you were given a general anesthetic. °· Follow up with your health care provider as directed. °SEEK MEDICAL CARE IF: °· You feel dizzy or lightheaded. °· You feel sick to your stomach (nauseous). °· You have abnormal vaginal discharge. °· You  have a rash. °· You have pain that is not controlled with medicine. °SEEK IMMEDIATE MEDICAL CARE IF: °· You have bleeding that is heavier than a normal menstrual period. °· You have a fever. °· You have increasing cramps or pain, not controlled with medicine. °· You have new belly (abdominal) pain. °· You pass out. °· You have pain in the tops of your shoulders (shoulder strap areas). °· You have shortness of breath. °  °This information is not intended to replace advice given to you by your health care provider. Make sure you discuss any questions you have with your health care provider. °  °Document Released: 03/19/2013 Document Reviewed: 03/19/2013 °Elsevier Interactive Patient Education ©2016 [employer] ° ° °AMBULATORY SURGERY  °DISCHARGE INSTRUCTIONS ° ° °1) The drugs that you were given will stay in your system until tomorrow so for the next 24 hours you should not: ° °A) Drive an automobile °B) Make any legal decisions °C) Drink any alcoholic beverage ° ° °2) You may resume regular meals tomorrow.  Today it is better to start with liquids and gradually work up to solid foods. ° °You may eat anything you prefer, but it is better to start with liquids, then soup and crackers, and gradually work up to solid foods. ° ° °3) Please notify your doctor immediately if you have any unusual bleeding, trouble breathing, redness and pain at the surgery site, drainage, fever, or pain not relieved by medication. ° ° ° °4) Additional Instructions: ° ° °Please contact your   physician with any problems or Same Day Surgery at [phone], Monday through Friday 6 am to 4 pm, or [place] [facility] Main number at [phone]. °

## 2015-07-08 NOTE — Anesthesia Postprocedure Evaluation (Signed)
Anesthesia Post Note  Patient: Mariah Reeves  Procedure(s) Performed: Procedure(s) (LRB): DILATATION & CURETTAGE/HYSTEROSCOPY WITH MYOSURE (N/A)  Patient location during evaluation: PACU Anesthesia Type: General Level of consciousness: awake and alert Pain management: pain level controlled Vital Signs Assessment: post-procedure vital signs reviewed and stable Respiratory status: spontaneous breathing, nonlabored ventilation, respiratory function stable and patient connected to nasal cannula oxygen Cardiovascular status: blood pressure returned to baseline and stable Postop Assessment: no signs of nausea or vomiting Anesthetic complications: no    Last Vitals:  Filed Vitals:   07/08/15 1331 07/08/15 1417  BP: 137/76 138/93  Pulse: 81 88  Temp: 36.6 C   Resp: 16 16    Last Pain:  Filed Vitals:   07/08/15 1437  PainSc: 2                  Cleda Mccreedy Stepfanie Yott

## 2015-07-08 NOTE — Op Note (Addendum)
Operative Note  07/08/2015  PRE-OP DIAGNOSIS: Endometrial Abnormality, Menorrhagia  POST-OP DIAGNOSIS: same   SURGEON: Annamarie Major, MD, FACOG  PROCEDURE: Procedure(s): DILATATION & CURETTAGE/HYSTEROSCOPY WITH MYOSURE   ANESTHESIA: Choice   ESTIMATED BLOOD LOSS: Min, < 10 mL   SPECIMENS:  ECC, EMC  FLUID DEFICIT: Min  COMPLICATIONS: None  DISPOSITION: PACU - hemodynamically stable.  CONDITION: stable  FINDINGS: Exam under anesthesia revealed small, mobile  uterus with no masses and bilateral adnexa without masses or fullness. Hysteroscopy revealed a thickened irregular uterine cavity with bilateral tubal ostia and normal appearing endocervical canal.  PROCEDURE IN DETAIL: After informed consent was obtained, the patient was taken to the operating room where anesthesia was obtained without difficulty. The patient was positioned in the dorsal lithotomy position in Valley stirrups. The patient's bladder was catheterized with an in and out foley catheter. The patient was examined under anesthesia, with the above noted findings. The weightedspeculum was placed inside the patient's vagina, and the the anterior lip of the cervix was seen and grasped with the tenaculum.  An Endocervical specimen was obtained with a kevorkian curette. The uterine cavity was sounded to 9cm, and then the cervix was progressively dilated to a 18French-Pratt dilator. The 30 degree hysteroscope was introduced, with saline fluid used to distend the intrauterine cavity, with the above noted findings. Using the Myosure device a resection of the thickened tissue is accomplished.    The hystersocope was removed and the uterine cavity was curetted until a gritty texture was noted, yielding endometrial curettings. Excellent hemostasis was noted, and all instruments were removed, with excellent hemostasis noted throughout. She was then taken out of dorsal lithotomy.  Minimal discrepancy in fluid was noted.  The patient  tolerated the procedure well. Sponge, lap and needle counts were correct x2. The patient was taken to recovery room in excellent condition.

## 2015-07-08 NOTE — Transfer of Care (Signed)
Immediate Anesthesia Transfer of Care Note  Patient: Mariah Reeves  Procedure(s) Performed: Procedure(s): DILATATION & CURETTAGE/HYSTEROSCOPY WITH MYOSURE (N/A)  Patient Location: PACU  Anesthesia Type:General  Level of Consciousness: awake, oriented and patient cooperative  Airway & Oxygen Therapy: Patient Spontanous Breathing and Patient connected to face mask oxygen  Post-op Assessment: Report given to RN and Post -op Vital signs reviewed and stable  Post vital signs: Reviewed and stable  Last Vitals:  Filed Vitals:   07/08/15 1032  BP: 134/75  Pulse: 81  Temp: 36.7 C  Resp: 16    Complications: No apparent anesthesia complications

## 2015-07-09 ENCOUNTER — Encounter: Payer: Self-pay | Admitting: Obstetrics & Gynecology

## 2015-07-09 LAB — SURGICAL PATHOLOGY

## 2015-07-22 ENCOUNTER — Ambulatory Visit: Payer: BC Managed Care – PPO | Attending: Pain Medicine | Admitting: Pain Medicine

## 2015-07-22 ENCOUNTER — Encounter: Payer: Self-pay | Admitting: Pain Medicine

## 2015-07-22 VITALS — BP 121/74 | HR 91 | Temp 98.1°F | Resp 16 | Ht 60.0 in | Wt 220.0 lb

## 2015-07-22 DIAGNOSIS — M5136 Other intervertebral disc degeneration, lumbar region: Secondary | ICD-10-CM | POA: Diagnosis not present

## 2015-07-22 DIAGNOSIS — M545 Low back pain: Secondary | ICD-10-CM | POA: Diagnosis present

## 2015-07-22 DIAGNOSIS — G43909 Migraine, unspecified, not intractable, without status migrainosus: Secondary | ICD-10-CM | POA: Insufficient documentation

## 2015-07-22 DIAGNOSIS — M533 Sacrococcygeal disorders, not elsewhere classified: Secondary | ICD-10-CM | POA: Insufficient documentation

## 2015-07-22 DIAGNOSIS — M5481 Occipital neuralgia: Secondary | ICD-10-CM

## 2015-07-22 DIAGNOSIS — M47816 Spondylosis without myelopathy or radiculopathy, lumbar region: Secondary | ICD-10-CM

## 2015-07-22 DIAGNOSIS — M79606 Pain in leg, unspecified: Secondary | ICD-10-CM | POA: Diagnosis present

## 2015-07-22 MED ORDER — HYDROCODONE-ACETAMINOPHEN 10-325 MG PO TABS: ORAL_TABLET | ORAL | Status: DC

## 2015-07-22 NOTE — Progress Notes (Signed)
   Subjective:    Patient ID: Mariah Reeves, female    DOB: [birth year], 50 y.o.   MRN: [id number]  HPI The patient is a 50 year old female who returns to pain management for further evaluation and treatment of pain involving the lower back and lower extremity region with pain radiating from the lumbar region toward the buttocks on the left as well as on the right and continuing to the extremities. The patient has increased pain with prolonged standing and walking. The patient has been quite relieved since patient to learn that her evaluation was without evidence of malignancy the patient admits to significant lumbar and lower extremity pain. We informed patient that we would prefer to avoid interventional treatment palpation recovered from her recent surgical procedure. We will continue noninterventional treatment. The patient agreed to suggested treatment plan   Review of Systems     Objective:   Physical Exam  There was tenderness to palpation of the paraspinal muscular region cervical region cervical facet region palpation which reproduces minimal discomfort. There was minimal tenderness of the splenius capitis and occipitalis musculature region. There was unremarkable Spurling's maneuver Patient appeared to be with bilaterally equal grip strength and Tinel and Phalen's maneuver were without increase of pain of significant degree. Palpation of the thoracic facet thoracic paraspinal musculature region was with no crepitus of the thoracic region and was without evidence of significant muscle spasm of the upper thoracic region with mild muscle spasms of the mid and lower thoracic region noted. Patient over the lumbar paraspinal must reason lumbar facet region was with moderate tends to palpation with lateral bending rotation extension and palpation of the lumbar facets reproducing moderate discomfort. Palpation of the PSIS and PII S region reproduced moderately severe discomfort with mild tenderness of  the greater trochanteric region and iliotibial band regions. Straight leg raise was tolerates approximately 20 without increased pain with dorsiflexion noted. EHL strength appeared to be slightly decreased and there was negative clonus negative Homans no costovertebral tenderness was noted      Assessment & Plan:      Sacroiliac joint dysfunction  Degenerative disc disease lumbar spine  Lumbar facet syndrome  Bilateral occipital neuralgia  Migraine     PLAN   Continue present medication hydrocodone acetaminophen  F/U PCP  Dr.Bliss  for evaliation of  BP and general medical condition  F/U surgical evaluation. May consider pending follow-up evaluations  F/U neurological evaluation. May consider pending follow-up evaluations  May consider radiofrequency rhizolysis or intraspinal procedures pending response to present treatment and F/U evaluation ask nurses for radiofrequency handout . We will avoid such treatment at this time as discussed. We will consider such treatment pending response to present treatment and follow-up evaluation  Patient to call pain management prior to scheduled return appointment should there be change in condition or should patient have concerns prior to scheduled return appointment

## 2015-07-22 NOTE — Progress Notes (Signed)
Safety precautions to be maintained throughout the outpatient stay will include: orient to surroundings, keep bed in low position, maintain call bell within reach at all times, provide assistance with transfer out of bed and ambulation.  

## 2015-07-22 NOTE — Patient Instructions (Signed)
PLAN   Continue present medication hydrocodone acetaminophen  F/U PCP  Dr.Bliss  for evaliation of  BP and general medical condition  F/U surgical evaluation. May consider pending follow-up evaluations  F/U neurological evaluation. May consider pending follow-up evaluations  May consider radiofrequency rhizolysis or intraspinal procedures pending response to present treatment and F/U evaluation ask nurses for radiofrequency handout   Patient to call pain management prior to scheduled return appointment should there be change in condition or should patient have concerns prior to scheduled return appointment

## 2015-08-24 ENCOUNTER — Ambulatory Visit: Payer: BC Managed Care – PPO | Attending: Pain Medicine | Admitting: Pain Medicine

## 2015-08-24 ENCOUNTER — Encounter: Payer: Self-pay | Admitting: Pain Medicine

## 2015-08-24 VITALS — BP 142/92 | HR 100 | Temp 97.5°F | Resp 16 | Ht 60.0 in | Wt 220.0 lb

## 2015-08-24 DIAGNOSIS — M5481 Occipital neuralgia: Secondary | ICD-10-CM | POA: Insufficient documentation

## 2015-08-24 DIAGNOSIS — M545 Low back pain: Secondary | ICD-10-CM | POA: Diagnosis present

## 2015-08-24 DIAGNOSIS — M5136 Other intervertebral disc degeneration, lumbar region: Secondary | ICD-10-CM | POA: Diagnosis not present

## 2015-08-24 DIAGNOSIS — M79606 Pain in leg, unspecified: Secondary | ICD-10-CM | POA: Diagnosis present

## 2015-08-24 DIAGNOSIS — G43909 Migraine, unspecified, not intractable, without status migrainosus: Secondary | ICD-10-CM | POA: Diagnosis not present

## 2015-08-24 DIAGNOSIS — M533 Sacrococcygeal disorders, not elsewhere classified: Secondary | ICD-10-CM | POA: Diagnosis not present

## 2015-08-24 DIAGNOSIS — M47816 Spondylosis without myelopathy or radiculopathy, lumbar region: Secondary | ICD-10-CM

## 2015-08-24 MED ORDER — HYDROCODONE-ACETAMINOPHEN 10-325 MG PO TABS: ORAL_TABLET | ORAL | Status: DC

## 2015-08-24 NOTE — Progress Notes (Signed)
Safety precautions to be maintained throughout the outpatient stay will include: orient to surroundings, keep bed in low position, maintain call bell within reach at all times, provide assistance with transfer out of bed and ambulation.  hAD d&c IN jAN-- md FORGOT TO SIGN PRESCRIPTION AND TOOK EXTRA MEDS AND HAS RAN OUT

## 2015-08-24 NOTE — Patient Instructions (Addendum)
PLAN   Continue present medication hydrocodone acetaminophen  F/U PCP  Dr.Bliss  for evaliation of  BP and general medical condition  F/U surgical evaluation. Follow-up with surgeon status post surgery as planned  F/U neurological evaluation. May consider PNCV/EMG studies and other studies pending follow-up evaluations  May consider radiofrequency rhizolysis or intraspinal procedures pending response to present treatment and F/U evaluation ask nurses for radiofrequency handout   Patient to call pain management prior to scheduled return appointment should there be change in condition or should patient have concerns prior to scheduled return appointmentPain Management Discharge Instructions  General Discharge Instructions :  If you need to reach your doctor call: Monday-Friday 8:00 am - 4:00 pm at [phone] or toll free [phone].  After clinic hours [phone] to have operator reach doctor.  Bring all of your medication bottles to all your appointments in the pain clinic.  To cancel or reschedule your appointment with Pain Management please remember to call 24 hours in advance to avoid a fee.  Refer to the educational materials which you have been given on: General Risks, I had my Procedure. Discharge Instructions, Post Sedation.  Post Procedure Instructions:  The drugs you were given will stay in your system until tomorrow, so for the next 24 hours you should not drive, make any legal decisions or drink any alcoholic beverages.  You may eat anything you prefer, but it is better to start with liquids then soups and crackers, and gradually work up to solid foods.  Please notify your doctor immediately if you have any unusual bleeding, trouble breathing or pain that is not related to your normal pain.  Depending on the type of procedure that was done, some parts of your body may feel week and/or numb.  This usually clears up by tonight or the next day.  Walk with the use of  an assistive device or accompanied by an adult for the 24 hours.  You may use ice on the affected area for the first 24 hours.  Put ice in a Ziploc bag and cover with a towel and place against area 15 minutes on 15 minutes off.  You may switch to heat after 24 hours.

## 2015-08-24 NOTE — Progress Notes (Signed)
   Subjective:    Patient ID: Mariah Reeves, female    DOB: 1965-10-13, 50 y.o.   MRN: [id number]  HPI    The patient is a 50 year old female who returns to pain management for further evaluation and treatment of pain involving the region of the lower back and lower extremity region. Patient is status post recent surgery at the present time we will avoid interventional treatment. The patient states that she has had some return of lower back and lower extremity pain which is improved significantly with prior interventional treatment performed in pain management Center. We will continue medications as prescribed and we'll consider patient for interventional treatment as well as additional modifications of treatment regimen pending follow-up evaluation the patient. The patient denied any trauma change in events of daily living the call significant change in symptomatology. The patient was understanding and in agreement with suggested treatment plan. The patient was advised to call pain management Center prior to scheduled return appointment should there be significant change in condition or should have other concerns regarding condition prior to scheduled return appointment. Patient was understanding and agreed to suggested treatment plan.   Review of Systems     Objective:   Physical Exam  Was minimal tenderness to palpation of the splenius capitis and occipitalis musculature regions. Palpation over the cervical facet cervical paraspinal musculature region reproduced mild discomfort. There was tenderness over the thoracic facet thoracic paraspinal muscle region of minimal discomfort with minimal muscle spasms of the upper and mid thoracic region and moderate muscle spasms of the lower thoracic region. No crepitus of the thoracic region was noted. The patient appeared to be with bilaterally equal grip strength and Tinel and Phalen's maneuver were without increased pain of significant degree. Palpation over  the lumbar paraspinal must reason lumbar facet region was with moderate tends to palpation with lateral bending rotation extension and palpation over the lumbar facets reproducing moderately severe discomfort. Straight leg raise was tolerates approximately 30 without increased pain with dorsiflexion noted. DTRs were difficult to elicit patient had difficulty relaxing. Patient with mild tenderness of the greater trochanteric region and iliotibial band region. There was moderate to moderately severe tenderness to palpation over the PSIS and PII S region. There was tenderness to palpation of the knees with negative anterior and posterior drawer signs without ballottement of the patella. There was negative clonus negative Homans. No sensory deficit or dermatomal distribution was detected Abdomen nontender with no costovertebral tenderness noted        Assessment & Plan:    Sacroiliac joint dysfunction  Degenerative disc disease lumbar spine  Lumbar facet syndrome  Bilateral occipital neuralgia  Migraine      PLAN   Continue present medication hydrocodone acetaminophen  F/U PCP  Dr.Bliss  for evaliation of  BP and general medical condition  F/U surgical evaluation. Follow-up with surgeon status post surgery as planned  F/U neurological evaluation. May consider PNCV/EMG studies and other studies pending follow-up evaluations  May consider radiofrequency rhizolysis or intraspinal procedures pending response to present treatment and F/U evaluation ask nurses for radiofrequency handout   Patient to call pain management prior to scheduled return appointment should there be change in condition or should patient have concerns prior to scheduled return appointment

## 2015-08-25 ENCOUNTER — Other Ambulatory Visit: Payer: Self-pay | Admitting: Family Medicine

## 2015-08-25 DIAGNOSIS — N6489 Other specified disorders of breast: Secondary | ICD-10-CM

## 2015-09-15 ENCOUNTER — Ambulatory Visit
Admission: RE | Admit: 2015-09-15 | Discharge: 2015-09-15 | Disposition: A | Discharge status: Home / Self Care | Payer: BC Managed Care – PPO | Source: Ambulatory Visit | Attending: Family Medicine | Admitting: Family Medicine

## 2015-09-15 DIAGNOSIS — R928 Other abnormal and inconclusive findings on diagnostic imaging of breast: Secondary | ICD-10-CM | POA: Diagnosis not present

## 2015-09-15 DIAGNOSIS — N6489 Other specified disorders of breast: Secondary | ICD-10-CM | POA: Diagnosis present

## 2015-09-23 ENCOUNTER — Encounter: Payer: Self-pay | Admitting: Pain Medicine

## 2015-09-23 ENCOUNTER — Ambulatory Visit: Payer: BC Managed Care – PPO | Attending: Pain Medicine | Admitting: Pain Medicine

## 2015-09-23 VITALS — BP 146/89 | HR 105 | Temp 97.5°F | Resp 16 | Ht 60.0 in | Wt 215.0 lb

## 2015-09-23 DIAGNOSIS — M5481 Occipital neuralgia: Secondary | ICD-10-CM | POA: Diagnosis not present

## 2015-09-23 DIAGNOSIS — M545 Low back pain: Secondary | ICD-10-CM | POA: Diagnosis present

## 2015-09-23 DIAGNOSIS — M5136 Other intervertebral disc degeneration, lumbar region: Secondary | ICD-10-CM | POA: Insufficient documentation

## 2015-09-23 DIAGNOSIS — G43909 Migraine, unspecified, not intractable, without status migrainosus: Secondary | ICD-10-CM | POA: Insufficient documentation

## 2015-09-23 DIAGNOSIS — M533 Sacrococcygeal disorders, not elsewhere classified: Secondary | ICD-10-CM | POA: Diagnosis not present

## 2015-09-23 DIAGNOSIS — M47816 Spondylosis without myelopathy or radiculopathy, lumbar region: Secondary | ICD-10-CM

## 2015-09-23 DIAGNOSIS — M79606 Pain in leg, unspecified: Secondary | ICD-10-CM | POA: Diagnosis present

## 2015-09-23 MED ORDER — HYDROCODONE-ACETAMINOPHEN 10-325 MG PO TABS: ORAL_TABLET | ORAL | Status: DC

## 2015-09-23 NOTE — Progress Notes (Signed)
   Subjective:    Patient ID: Vergia AlbertsJoy A Avey, female    DOB: [birth year], 50 y.o.   MRN: [id number]  HPI  The patient is a 50 year old female who returns to pain management for further evaluation and treatment of pain involving the mid lower back and lower extremity region. The patient states the pain radiates across the lower back regions and continues to the region of the buttocks on the left as well as on the right. The pain is aggravated with standing walking twisting turning maneuvers and becoming more intense as patient spends more time on the feet. The patient denies any trauma change in events of daily living because change in symptomatology. We will consider patient for interventional treatment at time return appointment consisting of block of nerves to the sacroiliac joint. The patient will continue presently prescribed medication hydrocodone acetaminophen at this time. All agreed to suggested treatment plan.    Review of Systems     Objective:   Physical Exam  Palpation of the splenius capitis and occipitalis musculature region reproduced mild discomfort with mild tenderness of the cervical facet cervical paraspinal musculature region. There was mild tenderness of the thoracic facet thoracic paraspinal musculature region. Palpation of the acromioclavicular and glenohumeral joint regions reproduces minimal discomfort and patient appeared to be unremarkable Spurling's maneuver. Tinel and Phalen's maneuver were without increase of pain of significant degree. Palpation over the lumbar paraspinal musculature region lumbar facet region was with moderate tenderness to palpation with moderate to moderately severe tenderness of the PSIS and PII S region. Straight leg raising tolerate approximately 20 without increased pain with dorsiflexion noted. There was negative clonus negative Homans. No sensory deficit or dermatomal dystrophy detected. DTRs difficult to this patient had difficulty relaxing.  Abdomen nontender with no costovertebral tenderness noted.      Assessment & Plan:      Sacroiliac joint dysfunction  Degenerative disc disease lumbar spine  Lumbar facet syndrome  Bilateral occipital neuralgia  Migraine       PLAN   Continue present medication hydrocodone acetaminophen  Block of nerves to the sacroiliac joint to be performed at time of return appointment  F/U PCP  Dr.Bliss  for evaliation of  BP and general medical condition  F/U surgical evaluation. Follow-up with surgeon status post surgery as planned  F/U neurological evaluation. May consider PNCV/EMG studies and other studies pending follow-up evaluations  May consider radiofrequency rhizolysis or intraspinal procedures pending response to present treatment and F/U evaluation ask nurses for radiofrequency handout   Patient to call pain management prior to scheduled return appointment should there be change in condition or should patient have concerns prior to scheduled return appointment

## 2015-09-23 NOTE — Progress Notes (Signed)
Safety precautions to be maintained throughout the outpatient stay will include: orient to surroundings, keep bed in low position, maintain call bell within reach at all times, provide assistance with transfer out of bed and ambulation.  

## 2015-09-23 NOTE — Patient Instructions (Addendum)
PLAN   Continue present medication hydrocodone acetaminophen  Block of nerves to the sacroiliac joint to be performed at time of return appointment  F/U PCP  Dr.Bliss  for evaliation of  BP and general medical condition  F/U surgical evaluation. Follow-up with surgeon status post surgery as planned  F/U neurological evaluation. May consider PNCV/EMG studies and other studies pending follow-up evaluations  May consider radiofrequency rhizolysis or intraspinal procedures pending response to present treatment and F/U evaluation ask nurses for radiofrequency handout   Patient to call pain management prior to scheduled return appointment should there be change in condition or should patient have concerns prior to scheduled return appointmentSacroiliac (SI) Joint Injection Patient Information  Description: The sacroiliac joint connects the scrum (very low back and tailbone) to the ilium (a pelvic bone which also forms half of the hip joint).  Normally this joint experiences very little motion.  When this joint becomes inflamed or unstable low back and or hip and pelvis pain may result.  Injection of this joint with local anesthetics (numbing medicines) and steroids can provide diagnostic information and reduce pain.  This injection is performed with the aid of x-ray guidance into the tailbone area while you are lying on your stomach.   You may experience an electrical sensation down the leg while this is being done.  You may also experience numbness.  We also may ask if we are reproducing your normal pain during the injection.  Conditions which may be treated SI injection:   Low back, buttock, hip or leg pain  Preparation for the Injection:  1. Do not eat any solid food or dairy products within 8 hours of your appointment.  2. You may drink clear liquids up to 3 hours before appointment.  Clear liquids include water, black coffee, juice or soda.  No milk or cream please. 3. You may take your  regular medications, including pain medications with a sip of water before your appointment.  Diabetics should hold regular insulin (if take separately) and take 1/2 normal NPH dose the morning of the procedure.  Carry some sugar containing items with you to your appointment. 4. A driver must accompany you and be prepared to drive you home after your procedure. 5. Bring all of your current medications with you. 6. An IV may be inserted and sedation may be given at the discretion of the physician. 7. A blood pressure cuff, EKG and other monitors will often be applied during the procedure.  Some patients may need to have extra oxygen administered for a short period.  8. You will be asked to provide medical information, including your allergies, prior to the procedure.  We must know immediately if you are taking blood thinners (like Coumadin/Warfarin) or if you are allergic to IV iodine contrast (dye).  We must know if you could possible be pregnant.  Possible side effects:   Bleeding from needle site  Infection (rare, may require surgery)  Nerve injury (rare)  Numbness & tingling (temporary)  A brief convulsion or seizure  Light-headedness (temporary)  Pain at injection site (several days)  Decreased blood pressure (temporary)  Weakness in the leg (temporary)   Call if you experience:   New onset weakness or numbness of an extremity below the injection site that last more than 8 hours.  Hives or difficulty breathing ( go to the emergency room)  Inflammation or drainage at the injection site  Any new symptoms which are concerning to you  Please note:  Although the local anesthetic injected can often make your back/ hip/ buttock/ leg feel good for several hours after the injections, the pain will likely return.  It takes 3-7 days for steroids to work in the sacroiliac area.  You may not notice any pain relief for at least that one week.  If effective, we will often do a series  of three injections spaced 3-6 weeks apart to maximally decrease your pain.  After the initial series, we generally will wait some months before a repeat injection of the same type.  If you have any questions, please call [phone] [facility]  What are the risk, side effects and possible complications? Generally speaking, most procedures are safe.  However, with any procedure there are risks, side effects, and the possibility of complications.  The risks and complications are dependent upon the sites that are lesioned, or the type of nerve block to be performed.  The closer the procedure is to the spine, the more serious the risks are.  Great care is taken when placing the radio frequency needles, block needles or lesioning probes, but sometimes complications can occur. 1. Infection: Any time there is an injection through the skin, there is a risk of infection.  This is why sterile conditions are used for these blocks.  There are four possible types of infection. 1. Localized skin infection. 2. Central Nervous System Infection-This can be in the form of Meningitis, which can be deadly. 3. Epidural Infections-This can be in the form of an epidural abscess, which can cause pressure inside of the spine, causing compression of the spinal cord with subsequent paralysis. This would require an emergency surgery to decompress, and there are no guarantees that the patient would recover from the paralysis. 4. Discitis-This is an infection of the intervertebral discs.  It occurs in about 1% of discography procedures.  It is difficult to treat and it may lead to surgery.        2. Pain: the needles have to go through skin and soft tissues, will cause soreness.       3. Damage to internal structures:  The nerves to be lesioned may be near blood vessels or    other nerves which can be potentially damaged.       4. Bleeding: Bleeding is more  common if the patient is taking blood thinners such as  aspirin, Coumadin, Ticiid, Plavix, etc., or if he/she have some genetic predisposition  such as hemophilia. Bleeding into the spinal canal can cause compression of the spinal  cord with subsequent paralysis.  This would require an emergency surgery to  decompress and there are no guarantees that the patient would recover from the  paralysis.       5. Pneumothorax:  Puncturing of a lung is a possibility, every time a needle is introduced in  the area of the chest or upper back.  Pneumothorax refers to free air around the  collapsed lung(s), inside of the thoracic cavity (chest cavity).  Another two possible  complications related to a similar event would include: Hemothorax and Chylothorax.   These are variations of the Pneumothorax, where instead of air around the collapsed  lung(s), you may have blood or chyle, respectively.       6. Spinal headaches: They may occur with any procedures in the area of the spine.       7. Persistent CSF (Cerebro-Spinal Fluid) leakage: This is a  rare problem, but may occur  with prolonged intrathecal or epidural catheters either due to the formation of a fistulous  track or a dural tear.       8. Nerve damage: By working so close to the spinal cord, there is always a possibility of  nerve damage, which could be as serious as a permanent spinal cord injury with  paralysis.       9. Death:  Although rare, severe deadly allergic reactions known as "Anaphylactic  reaction" can occur to any of the medications used.      10. Worsening of the symptoms:  We can always make thing worse.  What are the chances of something like this happening? Chances of any of this occuring are extremely low.  By statistics, you have more of a chance of getting killed in a motor vehicle accident: while driving to the hospital than any of the above occurring .  Nevertheless, you should be aware that they are possibilities.  In general, it is  similar to taking a shower.  Everybody knows that you can slip, hit your head and get killed.  Does that mean that you should not shower again?  Nevertheless always keep in mind that statistics do not mean anything if you happen to be on the wrong side of them.  Even if a procedure has a 1 (one) in a 1,000,000 (million) chance of going wrong, it you happen to be that one..Also, keep in mind that by statistics, you have more of a chance of having something go wrong when taking medications.  Who should not have this procedure? If you are on a blood thinning medication (e.g. Coumadin, Plavix, see list of "Blood Thinners"), or if you have an active infection going on, you should not have the procedure.  If you are taking any blood thinners, please inform your physician.  How should I prepare for this procedure?  Do not eat or drink anything at least six hours prior to the procedure.  Bring a driver with you .  It cannot be a taxi.  Come accompanied by an adult that can drive you back, and that is strong enough to help you if your legs get weak or numb from the local anesthetic.  Take all of your medicines the morning of the procedure with just enough water to swallow them.  If you have diabetes, make sure that you are scheduled to have your procedure done first thing in the morning, whenever possible.  If you have diabetes, take only half of your insulin dose and notify our nurse that you have done so as soon as you arrive at the clinic.  If you are diabetic, but only take blood sugar pills (oral hypoglycemic), then do not take them on the morning of your procedure.  You may take them after you have had the procedure.  Do not take aspirin or any aspirin-containing medications, at least eleven (11) days prior to the procedure.  They may prolong bleeding.  Wear loose fitting clothing that may be easy to take off and that you would not mind if it got stained with Betadine or blood.  Do not wear any  jewelry or perfume  Remove any nail coloring.  It will interfere with some of our monitoring equipment.  NOTE: Remember that this is not meant to be interpreted as a complete list of all possible complications.  Unforeseen problems may occur.  BLOOD THINNERS The following drugs contain aspirin or other products, which  can cause increased bleeding during surgery and should not be taken for 2 weeks prior to and 1 week after surgery.  If you should need take something for relief of minor pain, you may take acetaminophen which is found in Tylenol,m Datril, Anacin-3 and Panadol. It is not blood thinner. The products listed below are.  Do not take any of the products listed below in addition to any listed on your instruction sheet.  A.P.C or A.P.C with Codeine Codeine Phosphate Capsules #3 Ibuprofen Ridaura  ABC compound Congesprin Imuran rimadil  Advil Cope Indocin Robaxisal  Alka-Seltzer Effervescent Pain Reliever and Antacid Coricidin or Coricidin-D  Indomethacin Rufen  Alka-Seltzer plus Cold Medicine Cosprin Ketoprofen S-A-C Tablets  Anacin Analgesic Tablets or Capsules Coumadin Korlgesic Salflex  Anacin Extra Strength Analgesic tablets or capsules CP-2 Tablets Lanoril Salicylate  Anaprox Cuprimine Capsules Levenox Salocol  Anexsia-D Dalteparin Magan Salsalate  Anodynos Darvon compound Magnesium Salicylate Sine-off  Ansaid Dasin Capsules Magsal Sodium Salicylate  Anturane Depen Capsules Marnal Soma  APF Arthritis pain formula Dewitt's Pills Measurin Stanback  Argesic Dia-Gesic Meclofenamic Sulfinpyrazone  Arthritis Bayer Timed Release Aspirin Diclofenac Meclomen Sulindac  Arthritis pain formula Anacin Dicumarol Medipren Supac  Analgesic (Safety coated) Arthralgen Diffunasal Mefanamic Suprofen  Arthritis Strength Bufferin Dihydrocodeine Mepro Compound Suprol  Arthropan liquid Dopirydamole Methcarbomol with Aspirin Synalgos  ASA tablets/Enseals Disalcid Micrainin Tagament  Ascriptin Doan's  Midol Talwin  Ascriptin A/D Dolene Mobidin Tanderil  Ascriptin Extra Strength Dolobid Moblgesic Ticlid  Ascriptin with Codeine Doloprin or Doloprin with Codeine Momentum Tolectin  Asperbuf Duoprin Mono-gesic Trendar  Aspergum Duradyne Motrin or Motrin IB Triminicin  Aspirin plain, buffered or enteric coated Durasal Myochrisine Trigesic  Aspirin Suppositories Easprin Nalfon Trillsate  Aspirin with Codeine Ecotrin Regular or Extra Strength Naprosyn Uracel  Atromid-S Efficin Naproxen Ursinus  Auranofin Capsules Elmiron Neocylate Vanquish  Axotal Emagrin Norgesic Verin  Azathioprine Empirin or Empirin with Codeine Normiflo Vitamin E  Azolid Emprazil Nuprin Voltaren  Bayer Aspirin plain, buffered or children's or timed BC Tablets or powders Encaprin Orgaran Warfarin Sodium  Buff-a-Comp Enoxaparin Orudis Zorpin  Buff-a-Comp with Codeine Equegesic Os-Cal-Gesic   Buffaprin Excedrin plain, buffered or Extra Strength Oxalid   Bufferin Arthritis Strength Feldene Oxphenbutazone   Bufferin plain or Extra Strength Feldene Capsules Oxycodone with Aspirin   Bufferin with Codeine Fenoprofen Fenoprofen Pabalate or Pabalate-SF   Buffets II Flogesic Panagesic   Buffinol plain or Extra Strength Florinal or Florinal with Codeine Panwarfarin   Buf-Tabs Flurbiprofen Penicillamine   Butalbital Compound Four-way cold tablets Penicillin   Butazolidin Fragmin Pepto-Bismol   Carbenicillin Geminisyn Percodan   Carna Arthritis Reliever Geopen Persantine   Carprofen Gold's salt Persistin   Chloramphenicol Goody's Phenylbutazone   Chloromycetin Haltrain Piroxlcam   Clmetidine heparin Plaquenil   Cllnoril Hyco-pap Ponstel   Clofibrate Hydroxy chloroquine Propoxyphen         Before stopping any of these medications, be sure to consult the physician who ordered them.  Some, such as Coumadin (Warfarin) are ordered to prevent or treat serious conditions such as "deep thrombosis", "pumonary embolisms", and other heart  problems.  The amount of time that you may need off of the medication may also vary with the medication and the reason for which you were taking it.  If you are taking any of these medications, please make sure you notify your pain physician before you undergo any procedures.

## 2015-09-30 LAB — TOXASSURE SELECT 13 (MW), URINE: PDF: 0

## 2015-09-30 NOTE — Progress Notes (Signed)
Red ink noted. 

## 2015-10-13 ENCOUNTER — Ambulatory Visit: Payer: BC Managed Care – PPO | Attending: Pain Medicine | Admitting: Pain Medicine

## 2015-10-13 ENCOUNTER — Encounter: Payer: Self-pay | Admitting: Pain Medicine

## 2015-10-13 VITALS — BP 118/93 | HR 110 | Temp 98.0°F | Resp 16 | Ht 60.0 in | Wt 225.0 lb

## 2015-10-13 DIAGNOSIS — M4806 Spinal stenosis, lumbar region: Secondary | ICD-10-CM | POA: Insufficient documentation

## 2015-10-13 DIAGNOSIS — M5126 Other intervertebral disc displacement, lumbar region: Secondary | ICD-10-CM | POA: Insufficient documentation

## 2015-10-13 DIAGNOSIS — M2578 Osteophyte, vertebrae: Secondary | ICD-10-CM | POA: Insufficient documentation

## 2015-10-13 DIAGNOSIS — M545 Low back pain: Secondary | ICD-10-CM | POA: Diagnosis present

## 2015-10-13 DIAGNOSIS — M4316 Spondylolisthesis, lumbar region: Secondary | ICD-10-CM | POA: Insufficient documentation

## 2015-10-13 DIAGNOSIS — M47816 Spondylosis without myelopathy or radiculopathy, lumbar region: Secondary | ICD-10-CM | POA: Diagnosis not present

## 2015-10-13 DIAGNOSIS — M5136 Other intervertebral disc degeneration, lumbar region: Secondary | ICD-10-CM

## 2015-10-13 DIAGNOSIS — M533 Sacrococcygeal disorders, not elsewhere classified: Secondary | ICD-10-CM

## 2015-10-13 DIAGNOSIS — M79606 Pain in leg, unspecified: Secondary | ICD-10-CM | POA: Diagnosis present

## 2015-10-13 DIAGNOSIS — M791 Myalgia: Secondary | ICD-10-CM | POA: Diagnosis present

## 2015-10-13 DIAGNOSIS — M5481 Occipital neuralgia: Secondary | ICD-10-CM

## 2015-10-13 MED ORDER — ORPHENADRINE CITRATE 30 MG/ML IJ SOLN: 60.0000 mg | Freq: Once | INTRAMUSCULAR | Status: DC

## 2015-10-13 MED ORDER — MIDAZOLAM HCL 5 MG/5ML IJ SOLN
5.0000 mg | Freq: Once | INTRAMUSCULAR | Status: AC
  Administered 2015-10-13: 5 mg via INTRAVENOUS

## 2015-10-13 MED ORDER — CEFUROXIME AXETIL 250 MG PO TABS: 250.0000 mg | ORAL_TABLET | Freq: Two times a day (BID) | ORAL | Status: DC

## 2015-10-13 MED ORDER — SODIUM CHLORIDE 0.9 % IJ SOLN
INTRAMUSCULAR | Status: AC
  Administered 2015-10-13: 10 mL
  Filled 2015-10-13: qty 20

## 2015-10-13 MED ORDER — CEFAZOLIN SODIUM 1 G IJ SOLR
INTRAMUSCULAR | Status: AC
  Administered 2015-10-13: 1 g via INTRAVENOUS
  Filled 2015-10-13: qty 10

## 2015-10-13 MED ORDER — TRIAMCINOLONE ACETONIDE 40 MG/ML IJ SUSP
40.0000 mg | Freq: Once | INTRAMUSCULAR | Status: AC
  Administered 2015-10-13: 40 mg

## 2015-10-13 MED ORDER — TRIAMCINOLONE ACETONIDE 40 MG/ML IJ SUSP
INTRAMUSCULAR | Status: AC
  Administered 2015-10-13: 40 mg
  Filled 2015-10-13: qty 1

## 2015-10-13 MED ORDER — MIDAZOLAM HCL 5 MG/5ML IJ SOLN
INTRAMUSCULAR | Status: AC
  Administered 2015-10-13: 5 mg via INTRAVENOUS
  Filled 2015-10-13: qty 5

## 2015-10-13 MED ORDER — CEFAZOLIN SODIUM 1-5 GM-% IV SOLN: 1.0000 g | Freq: Once | INTRAVENOUS | Status: DC

## 2015-10-13 MED ORDER — BUPIVACAINE HCL (PF) 0.25 % IJ SOLN: 30.0000 mL | Freq: Once | INTRAMUSCULAR | Status: DC

## 2015-10-13 MED ORDER — BUPIVACAINE HCL (PF) 0.25 % IJ SOLN
INTRAMUSCULAR | Status: AC
  Filled 2015-10-13: qty 30

## 2015-10-13 MED ORDER — ORPHENADRINE CITRATE 30 MG/ML IJ SOLN
INTRAMUSCULAR | Status: AC
  Filled 2015-10-13: qty 2

## 2015-10-13 MED ORDER — LACTATED RINGERS IV SOLN: 1000.0000 mL | INTRAVENOUS | Status: DC

## 2015-10-13 MED ORDER — FENTANYL CITRATE (PF) 100 MCG/2ML IJ SOLN
100.0000 ug | Freq: Once | INTRAMUSCULAR | Status: AC
  Administered 2015-10-13: 100 ug via INTRAVENOUS

## 2015-10-13 MED ORDER — FENTANYL CITRATE (PF) 100 MCG/2ML IJ SOLN
INTRAMUSCULAR | Status: AC
  Administered 2015-10-13: 100 ug via INTRAVENOUS
  Filled 2015-10-13: qty 2

## 2015-10-13 NOTE — Progress Notes (Signed)
Subjective:    Patient ID: Mariah Reeves, female    DOB: 04-18-1966, 50 y.o.   MRN: [id number]  HPI   PROCEDURE:  Block of nerves to the sacroiliac joint.   NOTE:  The patient is a 50 y.o. female who [facility] for further evaluation and treatment of pain involving the lower back and lower extremity region with pain in the region of the buttocks as well. Prior MRI studies reveal L1-2: Mild disc bulging. Small left-sided disc protrusion without significant spinal stenosis. Slight retrolisthesis.  L2-3: Negative  L3-4: Mild disc and facet degeneration without significant spinal stenosis.  L4-5: Diffuse disc bulging. Moderate to advanced facet hypertrophy bilaterally. Moderate spinal stenosis.  L5-S1: Disc degeneration and spondylosis with diffuse osteophyte formation. Bilateral facet hypertrophy. Mild foraminal stenosis bilaterally.  IMPRESSION: Small left-sided disc protrusion L1-2 without neural impingement.  Disc and facet degeneration L4-5 causing moderate spinal stenosis.  L5-S1 spondylosis with mild foraminal stenosis bilaterally..  the patient is with reproduction of severe pain with palpation of the PSIS and PII S regions and is with positive Patrick's maneuver There is concern regarding a significant component of the patient's pain being due to sacroiliac joint dysfunction The risks, benefits, expectations of the procedure have been discussed and explained to the patient who is understanding and willing to proceed with interventional treatment in attempt to decrease severity of patient's symptoms, minimize the risk of medication escalation and  hopefully retard the progression of the patient's symptoms. We will proceed with what is felt to be a medically necessary procedure, block of nerves to the sacroiliac joint.   DESCRIPTION OF PROCEDURE:  Block of nerves to the sacroiliac joint.   The patient was taken to the fluoroscopy suite. With the  patient in the prone position with EKG, blood pressure, pulse, capnography, and pulse oximetry monitoring, IV Versed, IV fentanyl conscious sedation, Betadine prep of proposed entry site was performed.   Block of nerves at the L5 vertebral body level.   With the patient in prone position, under fluoroscopic guidance, a 22 -gauge needle was inserted at the L5 vertebral body level on the left side. With 15 degrees oblique orientation a 22 -gauge needle was inserted in the region known as Burton's eye or eye of the Scotty dog. Following documentation of needle placement in the area of Burton's eye or eye of the Scotty dog under fluoroscopic guidance, needle placement was then accomplished at the sacral ala level on the left side.   Needle placement at the sacral ala.   With the patient in prone position under fluoroscopic guidance with AP view of the lumbosacral spine, a 22 -gauge needle was inserted in the region known as the sacral ala on the left side. Following documentation of needle placement on the left side under fluoroscopic guidance needle placement was then accomplished at the S1 foramen level.   Needle placement at the S1 foramen level.   With the patient in prone position under fluoroscopic guidance with AP view of the lumbosacral spine and cephalad orientation, a 22 -gauge needle was inserted at the superior and lateral border of the S1 foramen on the left side. Following documentation of needle placement at the S1 foramen level on the left side, needle placement was then accomplished at the S2 foramen level on the left side.   Needle placement at the S2 foramen level.   With the patient in prone position with AP view of the lumbosacral spine with cephalad  orientation, a 22 - gauge needle was inserted at the superior and lateral border of the S2 foramen under fluoroscopic guidance on the left side. Following needle placement at the L5 vertebral body level, sacral ala, S1 foramen and S2  foramen on the left side, needle placement was verified on lateral view under fluoroscopic guidance.  Following needle placement documentation on lateral view, each needle was injected with 1 mL of preservative-free normal saline and Kenalog.   BLOCK OF THE NERVES TO SACROILIAC JOINT ON THE RIGHT SIDE The procedure was performed on the right side at the same levels as was performed on the left side and utilizing the same technique as on the left side and was performed under fluoroscopic guidance as on the left side   A total of  of Kenalog was utilized for the procedure.   PLAN:  1. Medications: The patient will continue presently prescribed medication hydrocodone acetaminophen  2. The patient will be considered for modification of treatment regimen pending response to the procedure performed on today's visit.  3. The patient is to follow-up with primary care physician Dr. Quillian Quince for evaluation of blood pressure and general medical condition following the procedure performed on today's visit.  4. Surgical evaluation as discussed.  5. Neurological evaluation as discussed. May consider PNCV EMG studies and other studies 6. The patient may be a candidate for radiofrequency procedures, implantation devices and other treatment pending response to treatment performed on today's visit and follow-up evaluation.  7. The patient has been advised to adhere to proper body mechanics and to avoid activities which may exacerbate the patient's symptoms.   [facility] as scheduled.   Review of Systems     Objective:   Physical Exam        Assessment & Plan:

## 2015-10-13 NOTE — Patient Instructions (Addendum)
PLAN   Continue present medication hydrocodone acetaminophen  please obtain Ceftin antibiotic today and begin taking Ceftin antibiotic as prescribed  F/U PCP  Dr.Bliss  for evaliation of  BP and general medical condition  F/U surgical evaluation. Follow-up with surgeon status post surgery as planned  F/U neurological evaluation. May consider PNCV/EMG studies and other studies pending follow-up evaluations  May consider radiofrequency rhizolysis or intraspinal procedures pending response to present treatment and F/U evaluation ask nurses for radiofrequency handout   Patient to call pain management prior to scheduled return appointment should there be change in condition or should patient have concerns prior to scheduled return appointment A prescription for Ceftin was sent to your pharmacy.

## 2015-10-13 NOTE — Progress Notes (Signed)
Safety precautions to be maintained throughout the outpatient stay will include: orient to surroundings, keep bed in low position, maintain call bell within reach at all times, provide assistance with transfer out of bed and ambulation.  

## 2015-10-14 ENCOUNTER — Telehealth: Payer: Self-pay

## 2015-10-14 NOTE — Telephone Encounter (Signed)
Post procedure phone call.  Patient states that she is a little sore, but is doing well.

## 2015-10-20 ENCOUNTER — Ambulatory Visit: Payer: BC Managed Care – PPO | Attending: Pain Medicine | Admitting: Pain Medicine

## 2015-10-20 ENCOUNTER — Encounter: Payer: Self-pay | Admitting: Pain Medicine

## 2015-10-20 VITALS — BP 105/77 | HR 109 | Temp 97.5°F | Resp 16 | Ht 60.0 in | Wt 220.0 lb

## 2015-10-20 DIAGNOSIS — M47816 Spondylosis without myelopathy or radiculopathy, lumbar region: Secondary | ICD-10-CM

## 2015-10-20 DIAGNOSIS — M533 Sacrococcygeal disorders, not elsewhere classified: Secondary | ICD-10-CM | POA: Insufficient documentation

## 2015-10-20 DIAGNOSIS — G43909 Migraine, unspecified, not intractable, without status migrainosus: Secondary | ICD-10-CM | POA: Diagnosis not present

## 2015-10-20 DIAGNOSIS — M5481 Occipital neuralgia: Secondary | ICD-10-CM | POA: Diagnosis not present

## 2015-10-20 DIAGNOSIS — M79606 Pain in leg, unspecified: Secondary | ICD-10-CM | POA: Insufficient documentation

## 2015-10-20 DIAGNOSIS — M5136 Other intervertebral disc degeneration, lumbar region: Secondary | ICD-10-CM | POA: Diagnosis not present

## 2015-10-20 DIAGNOSIS — M545 Low back pain: Secondary | ICD-10-CM | POA: Diagnosis present

## 2015-10-20 MED ORDER — HYDROCODONE-ACETAMINOPHEN 10-325 MG PO TABS: ORAL_TABLET | ORAL | Status: DC

## 2015-10-20 NOTE — Progress Notes (Signed)
   Subjective:    Patient ID: Mariah Reeves, female    DOB: 1966-02-24, 50 y.o.   MRN: [id number]  HPI  The patient is a 50 year old female who returns to pain management for further evaluation and treatment of pain involving the lower back and lower extremity region. The patient states that her lower back lower extremity pain has improved following block of nerves to the sacroiliac joint. The patient admits to some residual pain with pain radiating from the lumbar region toward the lower extremity aggravated by standing walking and becoming more intense as the day progresses. The patient is without desire to consider surgical intervention and wished to proceed with interventional treatment at time return appointment in attempt to decrease severity of symptoms, minimize progression of symptoms, and avoid the need for more involved treatment. The sacroiliac joint at time return appointment and patient will continue present medication hydrocodone acetaminophen as discussed. All agreed to suggested treatment plan  Review of Systems     Objective:   Physical Exam  There was tenderness to palpation of the paraspinal muscular treat and cervical region cervical facet region a mild degree with mild tenderness over the splenius capitis and occipitalis musculature regions. Palpation over the acromioclavicular and glenohumeral joint regions were attends to palpation of mild degree and the patient was with unremarkable Spurling's maneuver. Palpation of the thoracic region thoracic facet region was attends to palpation with tenderness of moderate degree in the lower thoracic region with moderate muscle spasm of the lower thoracic paraspinal musculature region. Palpation over the lumbar paraspinal must reason lumbar facet region was attends to palpation of moderate degree with lateral bending rotation extension and palpation of the lumbar facets reproducing moderate discomfort. Leg raise was tolerates approximately  30 without increased pain with dorsiflexion noted. EHL strength appeared to be decreased. There was negative clonus negative Homans. No sensory deficit or dermatomal dystrophy detected. There was severe tenderness to palpation of the PSIS and PII S regions. Palpation of these regions reproduced severely disabling pain. Abdomen nontender with no costovertebral tenderness noted    Assessment & Plan:     Sacroiliac joint dysfunction  Degenerative disc disease lumbar spine  Lumbar facet syndrome  Bilateral occipital neuralgia  Migraine     PLAN   Continue present medication hydrocodone acetaminophen  Block of nerves to the sacroiliac joint to be performed at time of return appointment  F/U PCP  Dr.Bliss  for evaliation of  BP and general medical condition  F/U surgical evaluation. Follow-up with surgeon status post surgery as planned  F/U neurological evaluation. May consider PNCV/EMG studies and other studies pending follow-up evaluations  May consider radiofrequency rhizolysis or intraspinal procedures pending response to present treatment and F/U evaluation ask nurses for radiofrequency handout   Patient to call pain management prior to scheduled return appointment should there be change in condition or should patient have concerns prior to scheduled return appointment

## 2015-10-20 NOTE — Progress Notes (Signed)
Safety precautions to be maintained throughout the outpatient stay will include: orient to surroundings, keep bed in low position, maintain call bell within reach at all times, provide assistance with transfer out of bed and ambulation.  

## 2015-10-20 NOTE — Patient Instructions (Addendum)
PLAN   Continue present medication hydrocodone acetaminophen  Block of nerves to the sacroiliac joint to be performed at time of return appointment  F/U PCP  Dr.Bliss  for evaliation of  BP and general medical condition  F/U surgical evaluation. Follow-up with surgeon status post surgery as planned  F/U neurological evaluation. May consider PNCV/EMG studies and other studies pending follow-up evaluations  May consider radiofrequency rhizolysis or intraspinal procedures pending response to present treatment and F/U evaluation ask nurses for radiofrequency handout   Patient to call pain management prior to scheduled return appointment should there be change in condition or should patient have concerns prior to scheduled return appointmentGENERAL RISKS AND COMPLICATIONS  What are the risk, side effects and possible complications? Generally speaking, most procedures are safe.  However, with any procedure there are risks, side effects, and the possibility of complications.  The risks and complications are dependent upon the sites that are lesioned, or the type of nerve block to be performed.  The closer the procedure is to the spine, the more serious the risks are.  Great care is taken when placing the radio frequency needles, block needles or lesioning probes, but sometimes complications can occur. 1. Infection: Any time there is an injection through the skin, there is a risk of infection.  This is why sterile conditions are used for these blocks.  There are four possible types of infection. 1. Localized skin infection. 2. Central Nervous System Infection-This can be in the form of Meningitis, which can be deadly. 3. Epidural Infections-This can be in the form of an epidural abscess, which can cause pressure inside of the spine, causing compression of the spinal cord with subsequent paralysis. This would require an emergency surgery to decompress, and there are no guarantees that the patient would  recover from the paralysis. 4. Discitis-This is an infection of the intervertebral discs.  It occurs in about 1% of discography procedures.  It is difficult to treat and it may lead to surgery.        2. Pain: the needles have to go through skin and soft tissues, will cause soreness.       3. Damage to internal structures:  The nerves to be lesioned may be near blood vessels or    other nerves which can be potentially damaged.       4. Bleeding: Bleeding is more common if the patient is taking blood thinners such as  aspirin, Coumadin, Ticiid, Plavix, etc., or if he/she have some genetic predisposition  such as hemophilia. Bleeding into the spinal canal can cause compression of the spinal  cord with subsequent paralysis.  This would require an emergency surgery to  decompress and there are no guarantees that the patient would recover from the  paralysis.       5. Pneumothorax:  Puncturing of a lung is a possibility, every time a needle is introduced in  the area of the chest or upper back.  Pneumothorax refers to free air around the  collapsed lung(s), inside of the thoracic cavity (chest cavity).  Another two possible  complications related to a similar event would include: Hemothorax and Chylothorax.   These are variations of the Pneumothorax, where instead of air around the collapsed  lung(s), you may have blood or chyle, respectively.       6. Spinal headaches: They may occur with any procedures in the area of the spine.       7. Persistent CSF (Cerebro-Spinal Fluid) leakage: This  is a rare problem, but may occur  with prolonged intrathecal or epidural catheters either due to the formation of a fistulous  track or a dural tear.       8. Nerve damage: By working so close to the spinal cord, there is always a possibility of  nerve damage, which could be as serious as a permanent spinal cord injury with  paralysis.       9. Death:  Although rare, severe deadly allergic reactions known as "Anaphylactic   reaction" can occur to any of the medications used.      10. Worsening of the symptoms:  We can always make thing worse.  What are the chances of something like this happening? Chances of any of this occuring are extremely low.  By statistics, you have more of a chance of getting killed in a motor vehicle accident: while driving to the hospital than any of the above occurring .  Nevertheless, you should be aware that they are possibilities.  In general, it is similar to taking a shower.  Everybody knows that you can slip, hit your head and get killed.  Does that mean that you should not shower again?  Nevertheless always keep in mind that statistics do not mean anything if you happen to be on the wrong side of them.  Even if a procedure has a 1 (one) in a 1,000,000 (million) chance of going wrong, it you happen to be that one..Also, keep in mind that by statistics, you have more of a chance of having something go wrong when taking medications.  Who should not have this procedure? If you are on a blood thinning medication (e.g. Coumadin, Plavix, see list of "Blood Thinners"), or if you have an active infection going on, you should not have the procedure.  If you are taking any blood thinners, please inform your physician.  How should I prepare for this procedure?  Do not eat or drink anything at least six hours prior to the procedure.  Bring a driver with you .  It cannot be a taxi.  Come accompanied by an adult that can drive you back, and that is strong enough to help you if your legs get weak or numb from the local anesthetic.  Take all of your medicines the morning of the procedure with just enough water to swallow them.  If you have diabetes, make sure that you are scheduled to have your procedure done first thing in the morning, whenever possible.  If you have diabetes, take only half of your insulin dose and notify our nurse that you have done so as soon as you arrive at the clinic.  If  you are diabetic, but only take blood sugar pills (oral hypoglycemic), then do not take them on the morning of your procedure.  You may take them after you have had the procedure.  Do not take aspirin or any aspirin-containing medications, at least eleven (11) days prior to the procedure.  They may prolong bleeding.  Wear loose fitting clothing that may be easy to take off and that you would not mind if it got stained with Betadine or blood.  Do not wear any jewelry or perfume  Remove any nail coloring.  It will interfere with some of our monitoring equipment.  NOTE: Remember that this is not meant to be interpreted as a complete list of all possible complications.  Unforeseen problems may occur.  BLOOD THINNERS The following drugs contain aspirin or other  products, which can cause increased bleeding during surgery and should not be taken for 2 weeks prior to and 1 week after surgery.  If you should need take something for relief of minor pain, you may take acetaminophen which is found in Tylenol,m Datril, Anacin-3 and Panadol. It is not blood thinner. The products listed below are.  Do not take any of the products listed below in addition to any listed on your instruction sheet.  A.P.C or A.P.C with Codeine Codeine Phosphate Capsules #3 Ibuprofen Ridaura  ABC compound Congesprin Imuran rimadil  Advil Cope Indocin Robaxisal  Alka-Seltzer Effervescent Pain Reliever and Antacid Coricidin or Coricidin-D  Indomethacin Rufen  Alka-Seltzer plus Cold Medicine Cosprin Ketoprofen S-A-C Tablets  Anacin Analgesic Tablets or Capsules Coumadin Korlgesic Salflex  Anacin Extra Strength Analgesic tablets or capsules CP-2 Tablets Lanoril Salicylate  Anaprox Cuprimine Capsules Levenox Salocol  Anexsia-D Dalteparin Magan Salsalate  Anodynos Darvon compound Magnesium Salicylate Sine-off  Ansaid Dasin Capsules Magsal Sodium Salicylate  Anturane Depen Capsules Marnal Soma  APF Arthritis pain formula Dewitt's  Pills Measurin Stanback  Argesic Dia-Gesic Meclofenamic Sulfinpyrazone  Arthritis Bayer Timed Release Aspirin Diclofenac Meclomen Sulindac  Arthritis pain formula Anacin Dicumarol Medipren Supac  Analgesic (Safety coated) Arthralgen Diffunasal Mefanamic Suprofen  Arthritis Strength Bufferin Dihydrocodeine Mepro Compound Suprol  Arthropan liquid Dopirydamole Methcarbomol with Aspirin Synalgos  ASA tablets/Enseals Disalcid Micrainin Tagament  Ascriptin Doan's Midol Talwin  Ascriptin A/D Dolene Mobidin Tanderil  Ascriptin Extra Strength Dolobid Moblgesic Ticlid  Ascriptin with Codeine Doloprin or Doloprin with Codeine Momentum Tolectin  Asperbuf Duoprin Mono-gesic Trendar  Aspergum Duradyne Motrin or Motrin IB Triminicin  Aspirin plain, buffered or enteric coated Durasal Myochrisine Trigesic  Aspirin Suppositories Easprin Nalfon Trillsate  Aspirin with Codeine Ecotrin Regular or Extra Strength Naprosyn Uracel  Atromid-S Efficin Naproxen Ursinus  Auranofin Capsules Elmiron Neocylate Vanquish  Axotal Emagrin Norgesic Verin  Azathioprine Empirin or Empirin with Codeine Normiflo Vitamin E  Azolid Emprazil Nuprin Voltaren  Bayer Aspirin plain, buffered or children's or timed BC Tablets or powders Encaprin Orgaran Warfarin Sodium  Buff-a-Comp Enoxaparin Orudis Zorpin  Buff-a-Comp with Codeine Equegesic Os-Cal-Gesic   Buffaprin Excedrin plain, buffered or Extra Strength Oxalid   Bufferin Arthritis Strength Feldene Oxphenbutazone   Bufferin plain or Extra Strength Feldene Capsules Oxycodone with Aspirin   Bufferin with Codeine Fenoprofen Fenoprofen Pabalate or Pabalate-SF   Buffets II Flogesic Panagesic   Buffinol plain or Extra Strength Florinal or Florinal with Codeine Panwarfarin   Buf-Tabs Flurbiprofen Penicillamine   Butalbital Compound Four-way cold tablets Penicillin   Butazolidin Fragmin Pepto-Bismol   Carbenicillin Geminisyn Percodan   Carna Arthritis Reliever Geopen Persantine    Carprofen Gold's salt Persistin   Chloramphenicol Goody's Phenylbutazone   Chloromycetin Haltrain Piroxlcam   Clmetidine heparin Plaquenil   Cllnoril Hyco-pap Ponstel   Clofibrate Hydroxy chloroquine Propoxyphen         Before stopping any of these medications, be sure to consult the physician who ordered them.  Some, such as Coumadin (Warfarin) are ordered to prevent or treat serious conditions such as "deep thrombosis", "pumonary embolisms", and other heart problems.  The amount of time that you may need off of the medication may also vary with the medication and the reason for which you were taking it.  If you are taking any of these medications, please make sure you notify your pain physician before you undergo any procedures.

## 2015-11-03 ENCOUNTER — Ambulatory Visit: Payer: BC Managed Care – PPO | Admitting: Pain Medicine

## 2015-11-10 ENCOUNTER — Ambulatory Visit: Payer: BC Managed Care – PPO | Attending: Pain Medicine | Admitting: Pain Medicine

## 2015-11-10 ENCOUNTER — Encounter: Payer: Self-pay | Admitting: Pain Medicine

## 2015-11-10 VITALS — BP 119/70 | HR 102 | Temp 98.4°F | Resp 22 | Ht 60.0 in | Wt 220.0 lb

## 2015-11-10 DIAGNOSIS — M47816 Spondylosis without myelopathy or radiculopathy, lumbar region: Secondary | ICD-10-CM

## 2015-11-10 DIAGNOSIS — M791 Myalgia: Secondary | ICD-10-CM | POA: Insufficient documentation

## 2015-11-10 DIAGNOSIS — M545 Low back pain: Secondary | ICD-10-CM | POA: Insufficient documentation

## 2015-11-10 DIAGNOSIS — M79606 Pain in leg, unspecified: Secondary | ICD-10-CM | POA: Diagnosis present

## 2015-11-10 DIAGNOSIS — M5136 Other intervertebral disc degeneration, lumbar region: Secondary | ICD-10-CM | POA: Diagnosis not present

## 2015-11-10 DIAGNOSIS — M5481 Occipital neuralgia: Secondary | ICD-10-CM

## 2015-11-10 DIAGNOSIS — M533 Sacrococcygeal disorders, not elsewhere classified: Secondary | ICD-10-CM

## 2015-11-10 MED ORDER — MIDAZOLAM HCL 5 MG/5ML IJ SOLN
5.0000 mg | Freq: Once | INTRAMUSCULAR | Status: AC
  Administered 2015-11-10: 5 mg via INTRAVENOUS
  Filled 2015-11-10: qty 5

## 2015-11-10 MED ORDER — BUPIVACAINE HCL (PF) 0.25 % IJ SOLN
30.0000 mL | Freq: Once | INTRAMUSCULAR | Status: DC
Start: 2015-11-10 — End: 2016-12-06
  Filled 2015-11-10: qty 30

## 2015-11-10 MED ORDER — TRIAMCINOLONE ACETONIDE 40 MG/ML IJ SUSP
40.0000 mg | Freq: Once | INTRAMUSCULAR | Status: AC
  Administered 2015-11-10: 40 mg
  Filled 2015-11-10: qty 1

## 2015-11-10 MED ORDER — LACTATED RINGERS IV SOLN
1000.0000 mL | INTRAVENOUS | Status: DC
  Administered 2015-11-10: 1000 mL via INTRAVENOUS

## 2015-11-10 MED ORDER — ORPHENADRINE CITRATE 30 MG/ML IJ SOLN
60.0000 mg | Freq: Once | INTRAMUSCULAR | Status: AC
  Administered 2015-11-10: 60 mg via INTRAMUSCULAR
  Filled 2015-11-10: qty 2

## 2015-11-10 MED ORDER — FENTANYL CITRATE (PF) 100 MCG/2ML IJ SOLN
100.0000 ug | Freq: Once | INTRAMUSCULAR | Status: AC
  Administered 2015-11-10: 100 ug via INTRAVENOUS
  Filled 2015-11-10: qty 2

## 2015-11-10 MED ORDER — SODIUM CHLORIDE 0.9 % IJ SOLN
INTRAMUSCULAR | Status: AC
  Administered 2015-11-10: 11:00:00
  Filled 2015-11-10: qty 20

## 2015-11-10 MED ORDER — CEFAZOLIN SODIUM 1-5 GM-% IV SOLN
1.0000 g | Freq: Once | INTRAVENOUS | Status: AC
  Administered 2015-11-10: 1 g via INTRAVENOUS

## 2015-11-10 MED ORDER — CEFUROXIME AXETIL 250 MG PO TABS: 250.0000 mg | ORAL_TABLET | Freq: Two times a day (BID) | ORAL | Status: DC

## 2015-11-10 NOTE — Progress Notes (Signed)
Subjective:    Patient ID: Mariah Reeves, female    DOB: 25-Feb-1966, 50 y.o.   MRN: [id number]  HPI  PROCEDURE:  Block of nerves to the sacroiliac joint.   NOTE:  The patient is a 50 y.o. female who [facility] for further evaluation and treatment of pain involving the lower back and lower extremity region with pain in the region of the buttocks as well. Prior MRI studies reveal degenerative disc disease of the lumbar spine area the patient is with reproduction of severe pain with palpation over the PSIS and PII S regions and is with positive Patrick's maneuver .   There is concern regarding a significant component of the patient's pain being due to sacroiliac joint dysfunction The risks, benefits, expectations of the procedure have been discussed and explained to the patient who is understanding and willing to proceed with interventional treatment in attempt to decrease severity of patient's symptoms, minimize the risk of medication escalation and  hopefully retard the progression of the patient's symptoms. We will proceed with what is felt to be a medically necessary procedure, block of nerves to the sacroiliac joint.   DESCRIPTION OF PROCEDURE:  Block of nerves to the sacroiliac joint.   The patient was taken to the fluoroscopy suite. With the patient in the prone position with EKG, blood pressure, pulse, capnography, and pulse oximetry monitoring, IV Versed, IV fentanyl conscious sedation, Betadine prep of proposed entry site was performed.   Block of nerves at the L5 vertebral body level.   With the patient in prone position, under fluoroscopic guidance, a 22 -gauge needle was inserted at the L5 vertebral body level on the left side. With 15 degrees oblique orientation a 22 -gauge needle was inserted in the region known as Burton's eye or eye of the Scotty dog. Following documentation of needle placement in the area of Burton's eye or eye of the Scotty dog under  fluoroscopic guidance, needle placement was then accomplished at the sacral ala level on the left side.   Needle placement at the sacral ala.   With the patient in prone position under fluoroscopic guidance with AP view of the lumbosacral spine, a 22 -gauge needle was inserted in the region known as the sacral ala on the left side. Following documentation of needle placement on the left side under fluoroscopic guidance needle placement was then accomplished at the S1 foramen level.   Needle placement at the S1 foramen level.   With the patient in prone position under fluoroscopic guidance with AP view of the lumbosacral spine and cephalad orientation, a 22 -gauge needle was inserted at the superior and lateral border of the S1 foramen on the left side. Following documentation of needle placement at the S1 foramen level on the left side, needle placement was then accomplished at the S2 foramen level on the left side.   Needle placement at the S2 foramen level.   With the patient in prone position with AP view of the lumbosacral spine with cephalad orientation, a 22 - gauge needle was inserted at the superior and lateral border of the S2 foramen under fluoroscopic guidance on the left side. Following needle placement at the L5 vertebral body level, sacral ala, S1 foramen and S2 foramen on the left side, needle placement was verified on lateral view under fluoroscopic guidance.  Following needle placement documentation on lateral view, each needle was injected with 1 mL of 0.25% bupivacaine and Kenalog.   BLOCK  OF THE NERVES TO SACROILIAC JOINT ON THE RIGHT SIDE The procedure was performed on the right side at the same levels as was performed on the left side and utilizing the same technique as on the left side and was performed under fluoroscopic guidance as on the left side   A total of  of Kenalog was utilized for the procedure.   PLAN:  1. Medications: The patient will continue presently  prescribed medication hydrocodone acetaminophen  2. The patient will be considered for modification of treatment regimen pending response to the procedure performed on today's visit.  3. The patient is to follow-up with primary care physician Dr. Quillian Quince  for evaluation of blood pressure and general medical condition following the procedure performed on today's visit.  4. Surgical evaluation as discussed. May consider pending follow-up evaluations  5. Neurological evaluation as discussed. May consider [facility] EMG studies and other studies  6. The patient may be a candidate for radiofrequency procedures, implantation devices and other treatment pending response to treatment performed on today's visit and follow-up evaluation.  7. The patient has been advised to adhere to proper body mechanics and to avoid activities which may exacerbate the patient's symptoms.   Return appointment to [facility] as scheduled.    Review of Systems     Objective:   Physical Exam        Assessment & Plan:

## 2015-11-10 NOTE — Progress Notes (Signed)
Safety precautions to be maintained throughout the outpatient stay will include: orient to surroundings, keep bed in low position, maintain call bell within reach at all times, provide assistance with transfer out of bed and ambulation.  

## 2015-11-10 NOTE — Patient Instructions (Addendum)
PLAN   Continue present medication hydrocodone acetaminophen  please obtain Ceftin antibiotic today and begin taking Ceftin antibiotic as prescribed  F/U PCP  Dr.Bliss  for evaliation of  BP and general medical condition  F/U surgical evaluation. Follow-up with surgeon status post surgery as planned  F/U neurological evaluation. May consider [facility]/EMG studies and other studies pending follow-up evaluations  May consider radiofrequency rhizolysis or intraspinal procedures pending response to present treatment and F/U evaluation ask nurses for radiofrequency handout   Patient to call pain management prior to scheduled return appointment should there be change in condition or should patient have concerns prior to scheduled return appointment  GENERAL RISKS AND COMPLICATIONS  What are the risk, side effects and possible complications? Generally speaking, most procedures are safe.  However, with any procedure there are risks, side effects, and the possibility of complications.  The risks and complications are dependent upon the sites that are lesioned, or the type of nerve block to be performed.  The closer the procedure is to the spine, the more serious the risks are.  Great care is taken when placing the radio frequency needles, block needles or lesioning probes, but sometimes complications can occur. 1. Infection: Any time there is an injection through the skin, there is a risk of infection.  This is why sterile conditions are used for these blocks.  There are four possible types of infection. 1. Localized skin infection. 2. Central Nervous System Infection-This can be in the form of Meningitis, which can be deadly. 3. Epidural Infections-This can be in the form of an epidural abscess, which can cause pressure inside of the spine, causing compression of the spinal cord with subsequent paralysis. This would require an emergency surgery to decompress, and there are no guarantees that the patient  would recover from the paralysis. 4. Discitis-This is an infection of the intervertebral discs.  It occurs in about 1% of discography procedures.  It is difficult to treat and it may lead to surgery.        2. Pain: the needles have to go through skin and soft tissues, will cause soreness.       3. Damage to internal structures:  The nerves to be lesioned may be near blood vessels or    other nerves which can be potentially damaged.       4. Bleeding: Bleeding is more common if the patient is taking blood thinners such as  aspirin, Coumadin, Ticiid, Plavix, etc., or if he/she have some genetic predisposition  such as hemophilia. Bleeding into the spinal canal can cause compression of the spinal  cord with subsequent paralysis.  This would require an emergency surgery to  decompress and there are no guarantees that the patient would recover from the  paralysis.       5. Pneumothorax:  Puncturing of a lung is a possibility, every time a needle is introduced in  the area of the chest or upper back.  Pneumothorax refers to free air around the  collapsed lung(s), inside of the thoracic cavity (chest cavity).  Another two possible  complications related to a similar event would include: Hemothorax and Chylothorax.   These are variations of the Pneumothorax, where instead of air around the collapsed  lung(s), you may have blood or chyle, respectively.       6. Spinal headaches: They may occur with any procedures in the area of the spine.       7. Persistent CSF (Cerebro-Spinal Fluid) leakage: This is  a rare problem, but may occur  with prolonged intrathecal or epidural catheters either due to the formation of a fistulous  track or a dural tear.       8. Nerve damage: By working so close to the spinal cord, there is always a possibility of  nerve damage, which could be as serious as a permanent spinal cord injury with  paralysis.       9. Death:  Although rare, severe deadly allergic reactions known as  "Anaphylactic  reaction" can occur to any of the medications used.      10. Worsening of the symptoms:  We can always make thing worse.  What are the chances of something like this happening? Chances of any of this occuring are extremely low.  By statistics, you have more of a chance of getting killed in a motor vehicle accident: while driving to the hospital than any of the above occurring .  Nevertheless, you should be aware that they are possibilities.  In general, it is similar to taking a shower.  Everybody knows that you can slip, hit your head and get killed.  Does that mean that you should not shower again?  Nevertheless always keep in mind that statistics do not mean anything if you happen to be on the wrong side of them.  Even if a procedure has a 1 (one) in a 1,000,000 (million) chance of going wrong, it you happen to be that one..Also, keep in mind that by statistics, you have more of a chance of having something go wrong when taking medications.  Who should not have this procedure? If you are on a blood thinning medication (e.g. Coumadin, Plavix, see list of "Blood Thinners"), or if you have an active infection going on, you should not have the procedure.  If you are taking any blood thinners, please inform your physician.  How should I prepare for this procedure?  Do not eat or drink anything at least six hours prior to the procedure.  Bring a driver with you .  It cannot be a taxi.  Come accompanied by an adult that can drive you back, and that is strong enough to help you if your legs get weak or numb from the local anesthetic.  Take all of your medicines the morning of the procedure with just enough water to swallow them.  If you have diabetes, make sure that you are scheduled to have your procedure done first thing in the morning, whenever possible.  If you have diabetes, take only half of your insulin dose and notify our nurse that you have done so as soon as you arrive at the  clinic.  If you are diabetic, but only take blood sugar pills (oral hypoglycemic), then do not take them on the morning of your procedure.  You may take them after you have had the procedure.  Do not take aspirin or any aspirin-containing medications, at least eleven (11) days prior to the procedure.  They may prolong bleeding.  Wear loose fitting clothing that may be easy to take off and that you would not mind if it got stained with Betadine or blood.  Do not wear any jewelry or perfume  Remove any nail coloring.  It will interfere with some of our monitoring equipment.  NOTE: Remember that this is not meant to be interpreted as a complete list of all possible complications.  Unforeseen problems may occur.  BLOOD THINNERS The following drugs contain aspirin or other products,  which can cause increased bleeding during surgery and should not be taken for 2 weeks prior to and 1 week after surgery.  If you should need take something for relief of minor pain, you may take acetaminophen which is found in Tylenol,m Datril, Anacin-3 and Panadol. It is not blood thinner. The products listed below are.  Do not take any of the products listed below in addition to any listed on your instruction sheet.  A.P.C or A.P.C with Codeine Codeine Phosphate Capsules #3 Ibuprofen Ridaura  ABC compound Congesprin Imuran rimadil  Advil Cope Indocin Robaxisal  Alka-Seltzer Effervescent Pain Reliever and Antacid Coricidin or Coricidin-D  Indomethacin Rufen  Alka-Seltzer plus Cold Medicine Cosprin Ketoprofen S-A-C Tablets  Anacin Analgesic Tablets or Capsules Coumadin Korlgesic Salflex  Anacin Extra Strength Analgesic tablets or capsules CP-2 Tablets Lanoril Salicylate  Anaprox Cuprimine Capsules Levenox Salocol  Anexsia-D Dalteparin Magan Salsalate  Anodynos Darvon compound Magnesium Salicylate Sine-off  Ansaid Dasin Capsules Magsal Sodium Salicylate  Anturane Depen Capsules Marnal Soma  APF Arthritis pain  formula Dewitt's Pills Measurin Stanback  Argesic Dia-Gesic Meclofenamic Sulfinpyrazone  Arthritis Bayer Timed Release Aspirin Diclofenac Meclomen Sulindac  Arthritis pain formula Anacin Dicumarol Medipren Supac  Analgesic (Safety coated) Arthralgen Diffunasal Mefanamic Suprofen  Arthritis Strength Bufferin Dihydrocodeine Mepro Compound Suprol  Arthropan liquid Dopirydamole Methcarbomol with Aspirin Synalgos  ASA tablets/Enseals Disalcid Micrainin Tagament  Ascriptin Doan's Midol Talwin  Ascriptin A/D Dolene Mobidin Tanderil  Ascriptin Extra Strength Dolobid Moblgesic Ticlid  Ascriptin with Codeine Doloprin or Doloprin with Codeine Momentum Tolectin  Asperbuf Duoprin Mono-gesic Trendar  Aspergum Duradyne Motrin or Motrin IB Triminicin  Aspirin plain, buffered or enteric coated Durasal Myochrisine Trigesic  Aspirin Suppositories Easprin Nalfon Trillsate  Aspirin with Codeine Ecotrin Regular or Extra Strength Naprosyn Uracel  Atromid-S Efficin Naproxen Ursinus  Auranofin Capsules Elmiron Neocylate Vanquish  Axotal Emagrin Norgesic Verin  Azathioprine Empirin or Empirin with Codeine Normiflo Vitamin E  Azolid Emprazil Nuprin Voltaren  Bayer Aspirin plain, buffered or children's or timed BC Tablets or powders Encaprin Orgaran Warfarin Sodium  Buff-a-Comp Enoxaparin Orudis Zorpin  Buff-a-Comp with Codeine Equegesic Os-Cal-Gesic   Buffaprin Excedrin plain, buffered or Extra Strength Oxalid   Bufferin Arthritis Strength Feldene Oxphenbutazone   Bufferin plain or Extra Strength Feldene Capsules Oxycodone with Aspirin   Bufferin with Codeine Fenoprofen Fenoprofen Pabalate or Pabalate-SF   Buffets II Flogesic Panagesic   Buffinol plain or Extra Strength Florinal or Florinal with Codeine Panwarfarin   Buf-Tabs Flurbiprofen Penicillamine   Butalbital Compound Four-way cold tablets Penicillin   Butazolidin Fragmin Pepto-Bismol   Carbenicillin Geminisyn Percodan   Carna Arthritis Reliever Geopen  Persantine   Carprofen Gold's salt Persistin   Chloramphenicol Goody's Phenylbutazone   Chloromycetin Haltrain Piroxlcam   Clmetidine heparin Plaquenil   Cllnoril Hyco-pap Ponstel   Clofibrate Hydroxy chloroquine Propoxyphen         Before stopping any of these medications, be sure to consult the physician who ordered them.  Some, such as Coumadin (Warfarin) are ordered to prevent or treat serious conditions such as "deep thrombosis", "pumonary embolisms", and other heart problems.  The amount of time that you may need off of the medication may also vary with the medication and the reason for which you were taking it.  If you are taking any of these medications, please make sure you notify your pain physician before you undergo any procedures.   Pain Management Discharge Instructions  General Discharge Instructions :  If you need to reach your doctor  call: Monday-Friday 8:00 am - 4:00 pm at [phone] or toll free [phone].  After clinic hours [phone] to have operator reach doctor.  Bring all of your medication bottles to all your appointments in the pain clinic.  To cancel or reschedule your appointment with Pain Management please remember to call 24 hours in advance to avoid a fee.  Refer to the educational materials which you have been given on: General Risks, I had my Procedure. Discharge Instructions, Post Sedation.  Post Procedure Instructions:  The drugs you were given will stay in your system until tomorrow, so for the next 24 hours you should not drive, make any legal decisions or drink any alcoholic beverages.  You may eat anything you prefer, but it is better to start with liquids then soups and crackers, and gradually work up to solid foods.  Please notify your doctor immediately if you have any unusual bleeding, trouble breathing or pain that is not related to your normal pain.  Depending on the type of procedure that was done, some parts of your body may  feel week and/or numb.  This usually clears up by tonight or the next day.  Walk with the use of an assistive device or accompanied by an adult for the 24 hours.  You may use ice on the affected area for the first 24 hours.  Put ice in a Ziploc bag and cover with a towel and place against area 15 minutes on 15 minutes off.  You may switch to heat after 24 hours.

## 2015-11-11 ENCOUNTER — Telehealth: Payer: Self-pay

## 2015-11-11 NOTE — Telephone Encounter (Signed)
Denies any needs at this time. "states she she feels the best she has since she has been coming here".

## 2015-11-18 ENCOUNTER — Encounter: Payer: Self-pay | Admitting: Pain Medicine

## 2015-11-18 ENCOUNTER — Other Ambulatory Visit: Payer: Self-pay | Admitting: Pain Medicine

## 2015-11-18 ENCOUNTER — Ambulatory Visit: Payer: BC Managed Care – PPO | Attending: Pain Medicine | Admitting: Pain Medicine

## 2015-11-18 VITALS — BP 122/89 | HR 90 | Temp 98.3°F | Resp 16 | Ht 60.0 in | Wt 212.0 lb

## 2015-11-18 DIAGNOSIS — M5136 Other intervertebral disc degeneration, lumbar region: Secondary | ICD-10-CM | POA: Diagnosis not present

## 2015-11-18 DIAGNOSIS — M47816 Spondylosis without myelopathy or radiculopathy, lumbar region: Secondary | ICD-10-CM

## 2015-11-18 DIAGNOSIS — M533 Sacrococcygeal disorders, not elsewhere classified: Secondary | ICD-10-CM | POA: Insufficient documentation

## 2015-11-18 DIAGNOSIS — G43909 Migraine, unspecified, not intractable, without status migrainosus: Secondary | ICD-10-CM | POA: Insufficient documentation

## 2015-11-18 DIAGNOSIS — M545 Low back pain: Secondary | ICD-10-CM | POA: Diagnosis present

## 2015-11-18 DIAGNOSIS — M5481 Occipital neuralgia: Secondary | ICD-10-CM | POA: Insufficient documentation

## 2015-11-18 MED ORDER — HYDROCODONE-ACETAMINOPHEN 10-325 MG PO TABS: ORAL_TABLET | ORAL | Status: DC

## 2015-11-18 NOTE — Patient Instructions (Addendum)
PLAN   Continue present medication hydrocodone acetaminophen  Block of nerves to the sacroiliac joint to be performed at time of return appointment  F/U PCP  Dr.Bliss  for evaliation of  BP and general medical condition  F/U surgical evaluation. Follow-up with surgeon status post surgery as planned  F/U neurological evaluation. May consider PNCV/EMG studies and other studies pending follow-up evaluations  May consider radiofrequency rhizolysis or intraspinal procedures pending response to present treatment and F/U evaluation   Patient to call pain management prior to scheduled return appointment should there be change in condition or should patient have concerns prior to scheduled return appointmentGENERAL RISKS AND COMPLICATIONS  What are the risk, side effects and possible complications? Generally speaking, most procedures are safe.  However, with any procedure there are risks, side effects, and the possibility of complications.  The risks and complications are dependent upon the sites that are lesioned, or the type of nerve block to be performed.  The closer the procedure is to the spine, the more serious the risks are.  Great care is taken when placing the radio frequency needles, block needles or lesioning probes, but sometimes complications can occur. 1. Infection: Any time there is an injection through the skin, there is a risk of infection.  This is why sterile conditions are used for these blocks.  There are four possible types of infection. 1. Localized skin infection. 2. Central Nervous System Infection-This can be in the form of Meningitis, which can be deadly. 3. Epidural Infections-This can be in the form of an epidural abscess, which can cause pressure inside of the spine, causing compression of the spinal cord with subsequent paralysis. This would require an emergency surgery to decompress, and there are no guarantees that the patient would recover from the  paralysis. 4. Discitis-This is an infection of the intervertebral discs.  It occurs in about 1% of discography procedures.  It is difficult to treat and it may lead to surgery.        2. Pain: the needles have to go through skin and soft tissues, will cause soreness.       3. Damage to internal structures:  The nerves to be lesioned may be near blood vessels or    other nerves which can be potentially damaged.       4. Bleeding: Bleeding is more common if the patient is taking blood thinners such as  aspirin, Coumadin, Ticiid, Plavix, etc., or if he/she have some genetic predisposition  such as hemophilia. Bleeding into the spinal canal can cause compression of the spinal  cord with subsequent paralysis.  This would require an emergency surgery to  decompress and there are no guarantees that the patient would recover from the  paralysis.       5. Pneumothorax:  Puncturing of a lung is a possibility, every time a needle is introduced in  the area of the chest or upper back.  Pneumothorax refers to free air around the  collapsed lung(s), inside of the thoracic cavity (chest cavity).  Another two possible  complications related to a similar event would include: Hemothorax and Chylothorax.   These are variations of the Pneumothorax, where instead of air around the collapsed  lung(s), you may have blood or chyle, respectively.       6. Spinal headaches: They may occur with any procedures in the area of the spine.       7. Persistent CSF (Cerebro-Spinal Fluid) leakage: This is a rare problem, but  may occur  with prolonged intrathecal or epidural catheters either due to the formation of a fistulous  track or a dural tear.       8. Nerve damage: By working so close to the spinal cord, there is always a possibility of  nerve damage, which could be as serious as a permanent spinal cord injury with  paralysis.       9. Death:  Although rare, severe deadly allergic reactions known as "Anaphylactic  reaction" can  occur to any of the medications used.      10. Worsening of the symptoms:  We can always make thing worse.  What are the chances of something like this happening? Chances of any of this occuring are extremely low.  By statistics, you have more of a chance of getting killed in a motor vehicle accident: while driving to the hospital than any of the above occurring .  Nevertheless, you should be aware that they are possibilities.  In general, it is similar to taking a shower.  Everybody knows that you can slip, hit your head and get killed.  Does that mean that you should not shower again?  Nevertheless always keep in mind that statistics do not mean anything if you happen to be on the wrong side of them.  Even if a procedure has a 1 (one) in a 1,000,000 (million) chance of going wrong, it you happen to be that one..Also, keep in mind that by statistics, you have more of a chance of having something go wrong when taking medications.  Who should not have this procedure? If you are on a blood thinning medication (e.g. Coumadin, Plavix, see list of "Blood Thinners"), or if you have an active infection going on, you should not have the procedure.  If you are taking any blood thinners, please inform your physician.  How should I prepare for this procedure?  Do not eat or drink anything at least six hours prior to the procedure.  Bring a driver with you .  It cannot be a taxi.  Come accompanied by an adult that can drive you back, and that is strong enough to help you if your legs get weak or numb from the local anesthetic.  Take all of your medicines the morning of the procedure with just enough water to swallow them.  If you have diabetes, make sure that you are scheduled to have your procedure done first thing in the morning, whenever possible.  If you have diabetes, take only half of your insulin dose and notify our nurse that you have done so as soon as you arrive at the clinic.  If you are  diabetic, but only take blood sugar pills (oral hypoglycemic), then do not take them on the morning of your procedure.  You may take them after you have had the procedure.  Do not take aspirin or any aspirin-containing medications, at least eleven (11) days prior to the procedure.  They may prolong bleeding.  Wear loose fitting clothing that may be easy to take off and that you would not mind if it got stained with Betadine or blood.  Do not wear any jewelry or perfume  Remove any nail coloring.  It will interfere with some of our monitoring equipment.  NOTE: Remember that this is not meant to be interpreted as a complete list of all possible complications.  Unforeseen problems may occur.  BLOOD THINNERS The following drugs contain aspirin or other products, which can cause increased  bleeding during surgery and should not be taken for 2 weeks prior to and 1 week after surgery.  If you should need take something for relief of minor pain, you may take acetaminophen which is found in Tylenol,m Datril, Anacin-3 and Panadol. It is not blood thinner. The products listed below are.  Do not take any of the products listed below in addition to any listed on your instruction sheet.  A.P.C or A.P.C with Codeine Codeine Phosphate Capsules #3 Ibuprofen Ridaura  ABC compound Congesprin Imuran rimadil  Advil Cope Indocin Robaxisal  Alka-Seltzer Effervescent Pain Reliever and Antacid Coricidin or Coricidin-D  Indomethacin Rufen  Alka-Seltzer plus Cold Medicine Cosprin Ketoprofen S-A-C Tablets  Anacin Analgesic Tablets or Capsules Coumadin Korlgesic Salflex  Anacin Extra Strength Analgesic tablets or capsules CP-2 Tablets Lanoril Salicylate  Anaprox Cuprimine Capsules Levenox Salocol  Anexsia-D Dalteparin Magan Salsalate  Anodynos Darvon compound Magnesium Salicylate Sine-off  Ansaid Dasin Capsules Magsal Sodium Salicylate  Anturane Depen Capsules Marnal Soma  APF Arthritis pain formula Dewitt's Pills  Measurin Stanback  Argesic Dia-Gesic Meclofenamic Sulfinpyrazone  Arthritis Bayer Timed Release Aspirin Diclofenac Meclomen Sulindac  Arthritis pain formula Anacin Dicumarol Medipren Supac  Analgesic (Safety coated) Arthralgen Diffunasal Mefanamic Suprofen  Arthritis Strength Bufferin Dihydrocodeine Mepro Compound Suprol  Arthropan liquid Dopirydamole Methcarbomol with Aspirin Synalgos  ASA tablets/Enseals Disalcid Micrainin Tagament  Ascriptin Doan's Midol Talwin  Ascriptin A/D Dolene Mobidin Tanderil  Ascriptin Extra Strength Dolobid Moblgesic Ticlid  Ascriptin with Codeine Doloprin or Doloprin with Codeine Momentum Tolectin  Asperbuf Duoprin Mono-gesic Trendar  Aspergum Duradyne Motrin or Motrin IB Triminicin  Aspirin plain, buffered or enteric coated Durasal Myochrisine Trigesic  Aspirin Suppositories Easprin Nalfon Trillsate  Aspirin with Codeine Ecotrin Regular or Extra Strength Naprosyn Uracel  Atromid-S Efficin Naproxen Ursinus  Auranofin Capsules Elmiron Neocylate Vanquish  Axotal Emagrin Norgesic Verin  Azathioprine Empirin or Empirin with Codeine Normiflo Vitamin E  Azolid Emprazil Nuprin Voltaren  Bayer Aspirin plain, buffered or children's or timed BC Tablets or powders Encaprin Orgaran Warfarin Sodium  Buff-a-Comp Enoxaparin Orudis Zorpin  Buff-a-Comp with Codeine Equegesic Os-Cal-Gesic   Buffaprin Excedrin plain, buffered or Extra Strength Oxalid   Bufferin Arthritis Strength Feldene Oxphenbutazone   Bufferin plain or Extra Strength Feldene Capsules Oxycodone with Aspirin   Bufferin with Codeine Fenoprofen Fenoprofen Pabalate or Pabalate-SF   Buffets II Flogesic Panagesic   Buffinol plain or Extra Strength Florinal or Florinal with Codeine Panwarfarin   Buf-Tabs Flurbiprofen Penicillamine   Butalbital Compound Four-way cold tablets Penicillin   Butazolidin Fragmin Pepto-Bismol   Carbenicillin Geminisyn Percodan   Carna Arthritis Reliever Geopen Persantine    Carprofen Gold's salt Persistin   Chloramphenicol Goody's Phenylbutazone   Chloromycetin Haltrain Piroxlcam   Clmetidine heparin Plaquenil   Cllnoril Hyco-pap Ponstel   Clofibrate Hydroxy chloroquine Propoxyphen         Before stopping any of these medications, be sure to consult the physician who ordered them.  Some, such as Coumadin (Warfarin) are ordered to prevent or treat serious conditions such as "deep thrombosis", "pumonary embolisms", and other heart problems.  The amount of time that you may need off of the medication may also vary with the medication and the reason for which you were taking it.  If you are taking any of these medications, please make sure you notify your pain physician before you undergo any procedures.

## 2015-11-18 NOTE — Progress Notes (Signed)
   Subjective:    Patient ID: Mariah Reeves, female    DOB: [birth year], 50 y.o.   MRN: [id number]  HPI  Patient is a 50 year old female who returns to pain management for further evaluation and treatment of pain involving the lower back lower extremity region. The patient is had improvement of her pain with prior treatment performed in pain management. The patient states that she has significant pain involving the region of the buttocks as well as the coccygeal. The patient is without evidence of trauma change in events of daily living because change in symptomatology. The patient continues medications consisting of hydrocodone acetaminophen and is without undesirable side effects. The patient stated her pain was aggravated by standing walking and sitting as well aggravated pain to a significant degree. We will proceed with interventional treatment at time return appointment consisting of block of nerves to the sacroiliac joint with myoneural block injections. All agreed to suggested treatment plan      Review of Systems     Objective:   Physical Exam   There was tenderness to palpation of the paraspinal muscular treat the cervical region cervical facet region palpation which reproduces pain of mild degree with mild tenderness of the splenius capitis and occipitalis regions. There was mild tenderness of the acromioclavicular and glenohumeral joint regions. The patient appeared to be with bilaterally equal grip strength. Tinel and Phalen's maneuver were without increased pain of significant degree. Palpation over the thoracic region thoracic facet region was with moderate tenderness to palpation without crepitus of the thoracic region noted. There was tenderness over the lumbar facet lumbar paraspinal musculature region with lateral bending rotation extension and palpation of the lumbar facets reproducing moderate discomfort. There was moderate to moderately severe tenderness of the PSIS and PII S  region a moderate to moderately severe tenderness to palpation of the coccyx. Palpation of the greater trochanteric region iliotibial band region was with mild discomfort and patient was with straight leg raising tolerates a 30 without increased pain with dorsiflexion noted. There was moderate increase of pain with pressure applied to the ileum with patient in lateral decubitus position with mild tenderness of the greater trochanteric region iliotibial band region. He deficit or dermatomal distribution was detected. There was negative clonus negative Homans. The knees were attends to palpation with negative anterior and posterior drawer signs without ballottement of the patella. DTRs appeared to be trace to 1+ at the knees. There was negative clonus negative Homans. Abdomen nontender and no costovertebral tenderness was noted     Assessment & Plan:      Sacroiliac joint dysfunction  Degenerative disc disease lumbar spine  Lumbar facet syndrome  Bilateral occipital neuralgia  Migraine       PLAN   Continue present medication hydrocodone acetaminophen  Block of nerves to the sacroiliac joint to be performed at time of return appointment  F/U PCP  Dr.Bliss  for evaliation of  BP and general medical condition  F/U surgical evaluation. Follow-up with surgeon status post surgery as planned  F/U neurological evaluation. May consider PNCV/EMG studies and other studies pending follow-up evaluations  May consider radiofrequency rhizolysis or intraspinal procedures pending response to present treatment and F/U evaluation   Patient to call pain management prior to scheduled return appointment should there be change in condition or should patient have concerns prior to scheduled return appointment

## 2015-12-20 ENCOUNTER — Ambulatory Visit: Payer: BC Managed Care – PPO | Admitting: Pain Medicine

## 2015-12-21 ENCOUNTER — Ambulatory Visit: Payer: BC Managed Care – PPO | Attending: Pain Medicine | Admitting: Pain Medicine

## 2015-12-21 ENCOUNTER — Encounter: Payer: Self-pay | Admitting: Pain Medicine

## 2015-12-21 VITALS — BP 133/96 | HR 118 | Temp 98.0°F | Resp 16

## 2015-12-21 DIAGNOSIS — M5481 Occipital neuralgia: Secondary | ICD-10-CM | POA: Insufficient documentation

## 2015-12-21 DIAGNOSIS — M5136 Other intervertebral disc degeneration, lumbar region: Secondary | ICD-10-CM | POA: Insufficient documentation

## 2015-12-21 DIAGNOSIS — M47816 Spondylosis without myelopathy or radiculopathy, lumbar region: Secondary | ICD-10-CM

## 2015-12-21 DIAGNOSIS — M533 Sacrococcygeal disorders, not elsewhere classified: Secondary | ICD-10-CM | POA: Insufficient documentation

## 2015-12-21 DIAGNOSIS — G43909 Migraine, unspecified, not intractable, without status migrainosus: Secondary | ICD-10-CM | POA: Diagnosis not present

## 2015-12-21 DIAGNOSIS — M79604 Pain in right leg: Secondary | ICD-10-CM | POA: Diagnosis present

## 2015-12-21 DIAGNOSIS — M545 Low back pain: Secondary | ICD-10-CM | POA: Diagnosis present

## 2015-12-21 DIAGNOSIS — M79605 Pain in left leg: Secondary | ICD-10-CM | POA: Diagnosis present

## 2015-12-21 MED ORDER — HYDROCODONE-ACETAMINOPHEN 10-325 MG PO TABS: ORAL_TABLET | ORAL | Status: DC

## 2015-12-21 NOTE — Progress Notes (Signed)
Safety precautions to be maintained throughout the outpatient stay will include: orient to surroundings, keep bed in low position, maintain call bell within reach at all times, provide assistance with transfer out of bed and ambulation.  

## 2015-12-21 NOTE — Patient Instructions (Addendum)
PLAN   Continue present medication hydrocodone acetaminophen  F/U PCP  Dr.Bliss  for evaliation of  BP and general medical condition  F/U surgical evaluation. Follow-up with surgeon status post surgery as planned  F/U neurological evaluation. May consider PNCV/EMG studies and other studies pending follow-up evaluations  May consider radiofrequency rhizolysis or intraspinal procedures pending response to present treatment and F/U evaluation   Patient to call pain management prior to scheduled return appointment should there be change in condition or should patient have concerns prior to scheduled returnFacet Joint Block The facet joints connect the bones of the spine (vertebrae). They make it possible for you to bend, twist, and make other movements with your spine. They also prevent you from overbending, overtwisting, and making other excessive movements.  A facet joint block is a procedure where a numbing medicine (anesthetic) is injected into a facet joint. Often, a type of anti-inflammatory medicine called a steroid is also injected. A facet joint block may be done for two reasons:   Diagnosis. A facet joint block may be done as a test to see whether neck or back pain is caused by a worn-down or infected facet joint. If the pain gets better after a facet joint block, it means the pain is probably coming from the facet joint. If the pain does not get better, it means the pain is probably not coming from the facet joint.   Therapy. A facet joint block may be done to relieve neck or back pain caused by a facet joint. A facet joint block is only done as a therapy if the pain does not improve with medicine, exercise programs, physical therapy, and other forms of pain management. LET [facility] Center[facility]OUR HEALTH CARE PROVIDER KNOW ABOUT:   Any allergies you have.   All medicines you are taking, including vitamins, herbs, eyedrops, and over-the-counter medicines and creams.   Previous problems you or members  of your family have had with the use of anesthetics.   Any blood disorders you have had.   Other health problems you have. RISKS AND COMPLICATIONS Generally, having a facet joint block is safe. However, as with any procedure, complications can occur. Possible complications associated with having a facet joint block include:   Bleeding.   Injury to a nerve near the injection site.   Pain at the injection site.   Weakness or numbness in areas controlled by nerves near the injection site.   Infection.   Temporary fluid retention.   Allergic reaction to anesthetics or medicines used during the procedure. BEFORE THE PROCEDURE   Follow your health care provider's instructions if you are taking dietary supplements or medicines. You may need to stop taking them or reduce your dosage.   Do not take any new dietary supplements or medicines without asking your health care provider first.   Follow your health care provider's instructions about eating and drinking before the procedure. You may need to stop eating and drinking several hours before the procedure.   Arrange to have an adult drive you home after the procedure. PROCEDURE  You may need to remove your clothing and dress in an open-back gown so that your health care provider can access your spine.   The procedure will be done while you are lying on an X-ray table. Most of the time you will be asked to lie on your stomach, but you may be asked to lie in a different position if an injection will be made in your neck.  Special machines will be used to monitor your oxygen levels, heart rate, and blood pressure.   If an injection will be made in your neck, an intravenous (IV) tube will be inserted into one of your veins. Fluids and medicine will flow directly into your body through the IV tube.   The area over the facet joint where the injection will be made will be cleaned with an antiseptic soap. The surrounding skin  will be covered with sterile drapes.   An anesthetic will be applied to your skin to make the injection area numb. You may feel a temporary stinging or burning sensation.   A video X-ray machine will be used to locate the joint. A contrast dye may be injected into the facet joint area to help with locating the joint.   When the joint is located, an anesthetic medicine will be injected into the joint through the needle.   Your health care provider will ask you whether you feel pain relief. If you do feel relief, a steroid may be injected to provide pain relief for a longer period of time. If you do not feel relief or feel only partial relief, additional injections of an anesthetic may be made in other facet joints.   The needle will be removed, the skin will be cleansed, and bandages will be applied.  AFTER THE PROCEDURE   You will be observed for 15-30 minutes before being allowed to go home. Do not drive. Have an adult drive you or take a taxi or public transportation instead.   If you feel pain relief, the pain will return in several hours or days when the anesthetic wears off.   You may feel pain relief 2-14 days after the procedure. The amount of time this relief lasts varies from person to person.   It is normal to feel some tenderness over the injected area(s) for 2 days following the procedure.   If you have diabetes, you may have a temporary increase in blood sugar.   This information is not intended to replace advice given to you by your health care provider. Make sure you discuss any questions you have with your health care provider.   Document Released: 10/18/2006 Document Revised: 06/19/2014 Document Reviewed: 03/18/2012 Elsevier Interactive Patient Education [employer]

## 2015-12-21 NOTE — Progress Notes (Signed)
   Subjective:    Patient ID: Mariah Reeves, female    DOB: 07/02/1965, 50 y.o.   MRN: [id number]  HPI  The patient is a 50 year old female who returns to pain management for further evaluation and treatment of pain involving the lower back and lower extremity regions. The patient continues to benefit significantly from previous procedure consisting of block of nerves to the sacroiliac joint. The patient states that she is able to perform most activities of daily living as well as obtaining restful sleep without pain interfering with any of her activities. We will continue hydrocodone acetaminophen and we will remain available to consider modification of treatment regimen should there be any return of patient's symptoms. All agreed with suggested treatment plan.  Review of Systems     Objective:   Physical Exam   Palpation of the cervical region cervical facet region was attends to palpation of mild degree with mild tenderness of the splenius capitis and occipitalis region. Patient was with mild tenderness of the acromioclavicular and glenohumeral joint region and was with unremarkable Spurling's maneuver. There was tenderness over the region of the thoracic spine of moderate degree with no crepitus of the thoracic region noted. The patient was able to perform drop test without difficulty. Tinel and Phalen's maneuver were without increase of pain and patient appeared to be with bilaterally equal grip strength. Palpation over the lumbar region was with mild tenderness to palpation with lateral bending rotation extension and palpation of the lumbar facets reproducing mild discomfort. There was tenderness over the region of the PSIS and PII S region a moderate degree. There was tenderness over the gluteal and piriformis musculature regions of moderate degree as well. Palpation of the greater trochanteric region iliotibial band region was with minimal discomfort. No sensory deficit or dermatomal  distribution detected. There was negative clonus negative Homans. DTRs appeared to be trace at the knees. Abdomen nontender with no costovertebral tenderness     Assessment & Plan:      Sacroiliac joint dysfunction  Degenerative disc disease lumbar spine  Lumbar facet syndrome  Bilateral occipital neuralgia  Migraine     PLAN   Continue present medication hydrocodone acetaminophen  F/U PCP  Dr.Bliss  for evaluation of  BP and general medical condition  F/U surgical evaluation. Follow-up with surgeon status post surgery as planned  F/U neurological evaluation. May consider PNCV/EMG studies and other studies pending follow-up evaluations  May consider radiofrequency rhizolysis or intraspinal procedures pending response to present treatment and F/U evaluation   Patient to call pain management prior to scheduled return appointment should there be change in condition or should patient have concerns prior to scheduled return

## 2016-01-03 ENCOUNTER — Ambulatory Visit: Payer: BC Managed Care – PPO | Attending: Pain Medicine | Admitting: Pain Medicine

## 2016-01-03 ENCOUNTER — Encounter: Payer: Self-pay | Admitting: Pain Medicine

## 2016-01-03 VITALS — BP 131/73 | HR 87 | Temp 98.6°F | Resp 16 | Ht 60.0 in | Wt 210.0 lb

## 2016-01-03 DIAGNOSIS — M533 Sacrococcygeal disorders, not elsewhere classified: Secondary | ICD-10-CM

## 2016-01-03 DIAGNOSIS — N859 Noninflammatory disorder of uterus, unspecified: Secondary | ICD-10-CM

## 2016-01-03 DIAGNOSIS — M79606 Pain in leg, unspecified: Secondary | ICD-10-CM | POA: Diagnosis present

## 2016-01-03 DIAGNOSIS — N949 Unspecified condition associated with female genital organs and menstrual cycle: Secondary | ICD-10-CM

## 2016-01-03 DIAGNOSIS — M47816 Spondylosis without myelopathy or radiculopathy, lumbar region: Secondary | ICD-10-CM

## 2016-01-03 DIAGNOSIS — M5136 Other intervertebral disc degeneration, lumbar region: Secondary | ICD-10-CM | POA: Diagnosis not present

## 2016-01-03 DIAGNOSIS — M545 Low back pain: Secondary | ICD-10-CM | POA: Diagnosis present

## 2016-01-03 DIAGNOSIS — M5481 Occipital neuralgia: Secondary | ICD-10-CM

## 2016-01-03 MED ORDER — ORPHENADRINE CITRATE 30 MG/ML IJ SOLN: 60.0000 mg | Freq: Once | INTRAMUSCULAR | Status: DC

## 2016-01-03 MED ORDER — BUPIVACAINE HCL (PF) 0.25 % IJ SOLN
30.0000 mL | Freq: Once | INTRAMUSCULAR | Status: AC
  Administered 2016-01-03: 30 mL

## 2016-01-03 MED ORDER — LACTATED RINGERS IV SOLN: 1000.0000 mL | INTRAVENOUS | Status: DC

## 2016-01-03 MED ORDER — FENTANYL CITRATE (PF) 100 MCG/2ML IJ SOLN
100.0000 ug | Freq: Once | INTRAMUSCULAR | Status: AC
  Administered 2016-01-03: 50 ug via INTRAVENOUS

## 2016-01-03 MED ORDER — MIDAZOLAM HCL 5 MG/5ML IJ SOLN: 5.0000 mg | Freq: Once | INTRAMUSCULAR | Status: DC

## 2016-01-03 MED ORDER — TRIAMCINOLONE ACETONIDE 40 MG/ML IJ SUSP: 40.0000 mg | Freq: Once | INTRAMUSCULAR | Status: DC

## 2016-01-03 MED ORDER — LACTATED RINGERS IV SOLN
1000.0000 mL | INTRAVENOUS | Status: DC
  Administered 2016-01-03: 1000 mL via INTRAVENOUS

## 2016-01-03 MED ORDER — FENTANYL CITRATE (PF) 100 MCG/2ML IJ SOLN: 100.0000 ug | Freq: Once | INTRAMUSCULAR | Status: DC

## 2016-01-03 MED ORDER — ORPHENADRINE CITRATE 30 MG/ML IJ SOLN
60.0000 mg | Freq: Once | INTRAMUSCULAR | Status: AC
  Administered 2016-01-03: 60 mg via INTRAMUSCULAR

## 2016-01-03 MED ORDER — MIDAZOLAM HCL 5 MG/5ML IJ SOLN
5.0000 mg | Freq: Once | INTRAMUSCULAR | Status: AC
  Administered 2016-01-03: 2 mg via INTRAVENOUS

## 2016-01-03 MED ORDER — CEFUROXIME AXETIL 250 MG PO TABS: 250.0000 mg | ORAL_TABLET | Freq: Two times a day (BID) | ORAL | 0 refills | Status: DC

## 2016-01-03 MED ORDER — CEFAZOLIN IN D5W 1 GM/50ML IV SOLN
1.0000 g | Freq: Once | INTRAVENOUS | Status: AC
  Administered 2016-01-03: 1 g via INTRAVENOUS

## 2016-01-03 MED ORDER — TRIAMCINOLONE ACETONIDE 40 MG/ML IJ SUSP
40.0000 mg | Freq: Once | INTRAMUSCULAR | Status: AC
  Administered 2016-01-03: 40 mg

## 2016-01-03 NOTE — Patient Instructions (Addendum)
PLAN   Continue present medication hydrocodone acetaminophen  please obtain Ceftin antibiotic today and begin taking Ceftin antibiotic as prescribed  F/U PCP  Dr.Bliss  for evaliation of  BP and general medical condition  F/U surgical evaluation. Follow-up with surgeon status post surgery as planned  F/U neurological evaluation. May consider PNCV/EMG studies and other studies pending follow-up evaluations  May consider radiofrequency rhizolysis or intraspinal procedures pending response to present treatment and F/U evaluation ask nurses for radiofrequency handout   Patient to call pain management prior to scheduled return appointment should there be change in condition or should patient have concerns prior to scheduled return appointmentPain Management Discharge Instructions  General Discharge Instructions :  If you need to reach your doctor call: Monday-Friday 8:00 am - 4:00 pm at [phone] or toll free [phone].  After clinic hours [phone] to have operator reach doctor.  Bring all of your medication bottles to all your appointments in the pain clinic.  To cancel or reschedule your appointment with Pain Management please remember to call 24 hours in advance to avoid a fee.  Refer to the educational materials which you have been given on: General Risks, I had my Procedure. Discharge Instructions, Post Sedation.  Post Procedure Instructions:  The drugs you were given will stay in your system until tomorrow, so for the next 24 hours you should not drive, make any legal decisions or drink any alcoholic beverages.  You may eat anything you prefer, but it is better to start with liquids then soups and crackers, and gradually work up to solid foods.  Please notify your doctor immediately if you have any unusual bleeding, trouble breathing or pain that is not related to your normal pain.  Depending on the type of procedure that was done, some parts of your body may feel week  and/or numb.  This usually clears up by tonight or the next day.  Walk with the use of an assistive device or accompanied by an adult for the 24 hours.  You may use ice on the affected area for the first 24 hours.  Put ice in a Ziploc bag and cover with a towel and place against area 15 minutes on 15 minutes off.  You may switch to heat after 24 hours.GENERAL RISKS AND COMPLICATIONS  What are the risk, side effects and possible complications? Generally speaking, most procedures are safe.  However, with any procedure there are risks, side effects, and the possibility of complications.  The risks and complications are dependent upon the sites that are lesioned, or the type of nerve block to be performed.  The closer the procedure is to the spine, the more serious the risks are.  Great care is taken when placing the radio frequency needles, block needles or lesioning probes, but sometimes complications can occur. 1. Infection: Any time there is an injection through the skin, there is a risk of infection.  This is why sterile conditions are used for these blocks.  There are four possible types of infection. 1. Localized skin infection. 2. Central Nervous System Infection-This can be in the form of Meningitis, which can be deadly. 3. Epidural Infections-This can be in the form of an epidural abscess, which can cause pressure inside of the spine, causing compression of the spinal cord with subsequent paralysis. This would require an emergency surgery to decompress, and there are no guarantees that the patient would recover from the paralysis. 4. Discitis-This is an infection of the intervertebral discs.  It occurs  in about 1% of discography procedures.  It is difficult to treat and it may lead to surgery.        2. Pain: the needles have to go through skin and soft tissues, will cause soreness.       3. Damage to internal structures:  The nerves to be lesioned may be near blood vessels or    other nerves  which can be potentially damaged.       4. Bleeding: Bleeding is more common if the patient is taking blood thinners such as  aspirin, Coumadin, Ticiid, Plavix, etc., or if he/she have some genetic predisposition  such as hemophilia. Bleeding into the spinal canal can cause compression of the spinal  cord with subsequent paralysis.  This would require an emergency surgery to  decompress and there are no guarantees that the patient would recover from the  paralysis.       5. Pneumothorax:  Puncturing of a lung is a possibility, every time a needle is introduced in  the area of the chest or upper back.  Pneumothorax refers to free air around the  collapsed lung(s), inside of the thoracic cavity (chest cavity).  Another two possible  complications related to a similar event would include: Hemothorax and Chylothorax.   These are variations of the Pneumothorax, where instead of air around the collapsed  lung(s), you may have blood or chyle, respectively.       6. Spinal headaches: They may occur with any procedures in the area of the spine.       7. Persistent CSF (Cerebro-Spinal Fluid) leakage: This is a rare problem, but may occur  with prolonged intrathecal or epidural catheters either due to the formation of a fistulous  track or a dural tear.       8. Nerve damage: By working so close to the spinal cord, there is always a possibility of  nerve damage, which could be as serious as a permanent spinal cord injury with  paralysis.       9. Death:  Although rare, severe deadly allergic reactions known as "Anaphylactic  reaction" can occur to any of the medications used.      10. Worsening of the symptoms:  We can always make thing worse.  What are the chances of something like this happening? Chances of any of this occuring are extremely low.  By statistics, you have more of a chance of getting killed in a motor vehicle accident: while driving to the hospital than any of the above occurring .  Nevertheless, you  should be aware that they are possibilities.  In general, it is similar to taking a shower.  Everybody knows that you can slip, hit your head and get killed.  Does that mean that you should not shower again?  Nevertheless always keep in mind that statistics do not mean anything if you happen to be on the wrong side of them.  Even if a procedure has a 1 (one) in a 1,000,000 (million) chance of going wrong, it you happen to be that one..Also, keep in mind that by statistics, you have more of a chance of having something go wrong when taking medications.  Who should not have this procedure? If you are on a blood thinning medication (e.g. Coumadin, Plavix, see list of "Blood Thinners"), or if you have an active infection going on, you should not have the procedure.  If you are taking any blood thinners, please inform your physician.  How  should I prepare for this procedure?  Do not eat or drink anything at least six hours prior to the procedure.  Bring a driver with you .  It cannot be a taxi.  Come accompanied by an adult that can drive you back, and that is strong enough to help you if your legs get weak or numb from the local anesthetic.  Take all of your medicines the morning of the procedure with just enough water to swallow them.  If you have diabetes, make sure that you are scheduled to have your procedure done first thing in the morning, whenever possible.  If you have diabetes, take only half of your insulin dose and notify our nurse that you have done so as soon as you arrive at the clinic.  If you are diabetic, but only take blood sugar pills (oral hypoglycemic), then do not take them on the morning of your procedure.  You may take them after you have had the procedure.  Do not take aspirin or any aspirin-containing medications, at least eleven (11) days prior to the procedure.  They may prolong bleeding.  Wear loose fitting clothing that may be easy to take off and that you would not  mind if it got stained with Betadine or blood.  Do not wear any jewelry or perfume  Remove any nail coloring.  It will interfere with some of our monitoring equipment.  NOTE: Remember that this is not meant to be interpreted as a complete list of all possible complications.  Unforeseen problems may occur.  BLOOD THINNERS The following drugs contain aspirin or other products, which can cause increased bleeding during surgery and should not be taken for 2 weeks prior to and 1 week after surgery.  If you should need take something for relief of minor pain, you may take acetaminophen which is found in Tylenol,m Datril, Anacin-3 and Panadol. It is not blood thinner. The products listed below are.  Do not take any of the products listed below in addition to any listed on your instruction sheet.  A.P.C or A.P.C with Codeine Codeine Phosphate Capsules #3 Ibuprofen Ridaura  ABC compound Congesprin Imuran rimadil  Advil Cope Indocin Robaxisal  Alka-Seltzer Effervescent Pain Reliever and Antacid Coricidin or Coricidin-D  Indomethacin Rufen  Alka-Seltzer plus Cold Medicine Cosprin Ketoprofen S-A-C Tablets  Anacin Analgesic Tablets or Capsules Coumadin Korlgesic Salflex  Anacin Extra Strength Analgesic tablets or capsules CP-2 Tablets Lanoril Salicylate  Anaprox Cuprimine Capsules Levenox Salocol  Anexsia-D Dalteparin Magan Salsalate  Anodynos Darvon compound Magnesium Salicylate Sine-off  Ansaid Dasin Capsules Magsal Sodium Salicylate  Anturane Depen Capsules Marnal Soma  APF Arthritis pain formula Dewitt's Pills Measurin Stanback  Argesic Dia-Gesic Meclofenamic Sulfinpyrazone  Arthritis Bayer Timed Release Aspirin Diclofenac Meclomen Sulindac  Arthritis pain formula Anacin Dicumarol Medipren Supac  Analgesic (Safety coated) Arthralgen Diffunasal Mefanamic Suprofen  Arthritis Strength Bufferin Dihydrocodeine Mepro Compound Suprol  Arthropan liquid Dopirydamole Methcarbomol with Aspirin Synalgos   ASA tablets/Enseals Disalcid Micrainin Tagament  Ascriptin Doan's Midol Talwin  Ascriptin A/D Dolene Mobidin Tanderil  Ascriptin Extra Strength Dolobid Moblgesic Ticlid  Ascriptin with Codeine Doloprin or Doloprin with Codeine Momentum Tolectin  Asperbuf Duoprin Mono-gesic Trendar  Aspergum Duradyne Motrin or Motrin IB Triminicin  Aspirin plain, buffered or enteric coated Durasal Myochrisine Trigesic  Aspirin Suppositories Easprin Nalfon Trillsate  Aspirin with Codeine Ecotrin Regular or Extra Strength Naprosyn Uracel  Atromid-S Efficin Naproxen Ursinus  Auranofin Capsules Elmiron Neocylate Vanquish  Axotal Emagrin Norgesic Verin  Azathioprine Empirin  or Empirin with Codeine Normiflo Vitamin E  Azolid Emprazil Nuprin Voltaren  Bayer Aspirin plain, buffered or children's or timed BC Tablets or powders Encaprin Orgaran Warfarin Sodium  Buff-a-Comp Enoxaparin Orudis Zorpin  Buff-a-Comp with Codeine Equegesic Os-Cal-Gesic   Buffaprin Excedrin plain, buffered or Extra Strength Oxalid   Bufferin Arthritis Strength Feldene Oxphenbutazone   Bufferin plain or Extra Strength Feldene Capsules Oxycodone with Aspirin   Bufferin with Codeine Fenoprofen Fenoprofen Pabalate or Pabalate-SF   Buffets II Flogesic Panagesic   Buffinol plain or Extra Strength Florinal or Florinal with Codeine Panwarfarin   Buf-Tabs Flurbiprofen Penicillamine   Butalbital Compound Four-way cold tablets Penicillin   Butazolidin Fragmin Pepto-Bismol   Carbenicillin Geminisyn Percodan   Carna Arthritis Reliever Geopen Persantine   Carprofen Gold's salt Persistin   Chloramphenicol Goody's Phenylbutazone   Chloromycetin Haltrain Piroxlcam   Clmetidine heparin Plaquenil   Cllnoril Hyco-pap Ponstel   Clofibrate Hydroxy chloroquine Propoxyphen         Before stopping any of these medications, be sure to consult the physician who ordered them.  Some, such as Coumadin (Warfarin) are ordered to prevent or treat serious  conditions such as "deep thrombosis", "pumonary embolisms", and other heart problems.  The amount of time that you may need off of the medication may also vary with the medication and the reason for which you were taking it.  If you are taking any of these medications, please make sure you notify your pain physician before you undergo any procedures.

## 2016-01-03 NOTE — Sedation Documentation (Signed)
Pt states that she has has a total of 4 vicodin in her before procedure-states MD aware- very drowsty

## 2016-01-03 NOTE — Progress Notes (Signed)
PROCEDURE:  Block of nerves to the sacroiliac joint.   NOTE:  The patient is a 50 y.o. female who returns to the [facility] for further evaluation and treatment of pain involving the lower back and lower extremity region with pain in the region of the buttocks as well. Prior studies reveal degenerative disc disease of the lumbar spine. The patient is with reproduction of severely disabling pain with palpation over the PSIS and PII S regions. .   There is concern regarding a significant component of the patient's pain being due to sacroiliac joint dysfunction The risks, benefits, expectations of the procedure have been discussed and explained to the patient who is understanding and willing to proceed with interventional treatment in attempt to decrease severity of patient's symptoms, minimize the risk of medication escalation and  hopefully retard the progression of the patient's symptoms. We will proceed with what is felt to be a medically necessary procedure, block of nerves to the sacroiliac joint.   DESCRIPTION OF PROCEDURE:  Block of nerves to the sacroiliac joint.   The patient was taken to the fluoroscopy suite. With the patient in the prone position with EKG, blood pressure, pulse, capnography, and pulse oximetry monitoring, IV Versed, IV fentanyl conscious sedation, Betadine prep of proposed entry site was performed.   Block of nerves at the L5 vertebral body level.   With the patient in prone position, under fluoroscopic guidance, a 22 -gauge needle was inserted at the L5 vertebral body level on the left side. With 15 degrees oblique orientation a 22 -gauge needle was inserted in the region known as Burton's eye or eye of the Scotty dog. Following documentation of needle placement in the area of Burton's eye or eye of the Scotty dog under fluoroscopic guidance, needle placement was then accomplished at the sacral ala level on the left side.   Needle placement at the sacral  ala.   With the patient in prone position under fluoroscopic guidance with AP view of the lumbosacral spine, a 22 -gauge needle was inserted in the region known as the sacral ala on the left side. Following documentation of needle placement on the left side under fluoroscopic guidance needle placement was then accomplished at the S1 foramen level.   Needle placement at the S1 foramen level.   With the patient in prone position under fluoroscopic guidance with AP view of the lumbosacral spine and cephalad orientation, a 22 -gauge needle was inserted at the superior and lateral border of the S1 foramen on the left side. Following documentation of needle placement at the S1 foramen level on the left side, needle placement was then accomplished at the S2 foramen level on the left side.   Needle placement at the S2 foramen level.   With the patient in prone position with AP view of the lumbosacral spine with cephalad orientation, a 22 - gauge needle was inserted at the superior and lateral border of the S2 foramen under fluoroscopic guidance on the left side. Following needle placement at the L5 vertebral body level, sacral ala, S1 foramen and S2 foramen on the left side, needle placement was verified on lateral view under fluoroscopic guidance.  Following needle placement documentation on lateral view, each needle was injected with 1 mL of 0.25% bupivacaine and Kenalog.   BLOCK OF THE NERVES TO SACROILIAC JOINT ON THE RIGHT SIDE The procedure was performed on the right side at the same levels as was performed on the left side  and utilizing the same technique as on the left side and was performed under fluoroscopic guidance as on the left side   A total of  of Kenalog was utilized for the procedure.   PLAN:  1. Medications: The patient will continue presently prescribed medication hydrocodone acetaminophen.  2. The patient will be considered for modification of treatment regimen pending response  to the procedure performed on today's visit.  3. The patient is to follow-up with primary care physician Dr. Quillian Quince for evaluation of blood pressure and general medical condition following the procedure performed on today's visit.  4. Surgical evaluation as discussed.  5. Neurological evaluation as discussed.  6. The patient may be a candidate for radiofrequency procedures, implantation devices and other treatment pending response to treatment performed on today's visit and follow-up evaluation.  7. The patient has been advised to adhere to proper body mechanics and to avoid activities which may exacerbate the patient's symptoms.   Return appointment to Pain Management Center as scheduled.

## 2016-01-04 ENCOUNTER — Telehealth: Payer: Self-pay | Admitting: *Deleted

## 2016-01-04 NOTE — Telephone Encounter (Signed)
Spoke with patient denies any questions or concerns re; procedure on yesterday.  

## 2016-01-05 DIAGNOSIS — G8929 Other chronic pain: Secondary | ICD-10-CM | POA: Insufficient documentation

## 2016-01-05 DIAGNOSIS — F319 Bipolar disorder, unspecified: Secondary | ICD-10-CM | POA: Diagnosis not present

## 2016-01-05 DIAGNOSIS — M545 Low back pain: Secondary | ICD-10-CM | POA: Insufficient documentation

## 2016-01-05 NOTE — ED Triage Notes (Signed)
Pt presents to ED via [facility] from home, c/o lower back pain. Pt has chronic back pain, states received a "shot" on Monday for pain as she normally get them to help with pain. Pt reports was given a shot in the tailbone area, states she does not normally get shots there. Pt c/o increased pain above area where shot was given. States took 6 Vicodin per PCP order today without relief. Pt alert and oriented x 4, no increased work in breathing.

## 2016-01-06 ENCOUNTER — Encounter: Payer: Self-pay | Admitting: Emergency Medicine

## 2016-01-06 ENCOUNTER — Emergency Department
Admission: EM | Admit: 2016-01-06 | Discharge: 2016-01-06 | Disposition: A | Discharge status: Home / Self Care | Payer: BC Managed Care – PPO | Attending: Emergency Medicine | Admitting: Emergency Medicine

## 2016-01-06 DIAGNOSIS — M549 Dorsalgia, unspecified: Secondary | ICD-10-CM

## 2016-01-06 DIAGNOSIS — G8929 Other chronic pain: Secondary | ICD-10-CM

## 2016-01-06 HISTORY — DX: Other chronic pain: G89.29

## 2016-01-06 HISTORY — DX: Dorsalgia, unspecified: M54.9

## 2016-01-06 MED ORDER — LIDOCAINE 5 % EX PTCH
2.0000 | MEDICATED_PATCH | CUTANEOUS | Status: DC
  Administered 2016-01-06: 2 via TRANSDERMAL
  Filled 2016-01-06: qty 2

## 2016-01-06 MED ORDER — OXYCODONE-ACETAMINOPHEN 5-325 MG PO TABS
2.0000 | ORAL_TABLET | Freq: Once | ORAL | Status: AC
  Administered 2016-01-06: 2 via ORAL
  Filled 2016-01-06: qty 2

## 2016-01-06 NOTE — ED Notes (Signed)
Discharge instructions reviewed with patient. Questions fielded by this RN. Patient verbalizes understanding of instructions. Patient discharged home in stable condition per Brown MD . No acute distress noted at time of discharge.   

## 2016-01-06 NOTE — ED Provider Notes (Signed)
[facility] Emergency Department Provider Note  ____________________________________________  Time seen: 3:30 AM  I have reviewed the triage vital signs and the nursing notes.   HISTORY  Chief Complaint Back Pain     HPI Mariah Reeves is a 50 y.o. female with history of chronic back pain presents from home with low back pain after receiving a "steroid shot by her private chronic pain physician on 01/03/2016. Patient states she got a shot in her tailbone which she has never received before which is the area of her pain at this time. Patient states that pain is unrelieved with Vicodin at home of which she's been taking 2 tablets. Patient denies any urinary or bowel changes. Patient denies any lower extremity weakness or numbness.     Past Medical History:  Diagnosis Date  . Allergy   . Anomaly, uterus    tumor of uterus  . Bipolar 1 disorder (HCC)   . Chronic back pain   . Depression     Patient Active Problem List   Diagnosis Date Noted  . Endometrial disorder 07/08/2015  . Bilateral occipital neuralgia 03/02/2015  . DDD (degenerative disc disease), lumbar 12/24/2014  . Facet syndrome, lumbar 12/24/2014  . 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 CARPAL TUNNEL RELEASE Left   . CHOLECYSTECTOMY    . DILATATION & CURETTAGE/HYSTEROSCOPY WITH MYOSURE N/A 07/08/2015   Procedure: DILATATION & CURETTAGE/HYSTEROSCOPY WITH MYOSURE;  Surgeon: Nadara Mustard, MD;  Location: [facility] ORS;  Service: Gynecology;  Laterality: N/A;  . DILATION AND CURETTAGE OF UTERUS     1/17  . GASTRIC BYPASS    . KNEE SURGERY    . MIDDLE EAR SURGERY    . NASAL SEPTUM SURGERY       Allergies Contrast media [iodinated diagnostic agents] and Tape  Family History  Problem Relation Age of Onset  . Hypertension Mother   . Diabetes Mother   . Heart disease Father   . Alcohol abuse Father   . Breast cancer Maternal  Grandmother     Social History Social History  Substance Use Topics  . Smoking status: Never Smoker  . Smokeless tobacco: Never Used  . Alcohol use No    Review of Systems  Constitutional: Negative for fever. Eyes: Negative for visual changes. ENT: Negative for sore throat. Cardiovascular: Negative for chest pain. Respiratory: Negative for shortness of breath. Gastrointestinal: Negative for abdominal pain, vomiting and diarrhea. Genitourinary: Negative for dysuria. Musculoskeletal: Positive for back pain. Skin: Negative for rash. Neurological: Negative for headaches, focal weakness or numbness.   10-point ROS otherwise negative.  ____________________________________________   PHYSICAL EXAM:  VITAL SIGNS: ED Triage Vitals [01/06/16 0002]  Enc Vitals Group     BP (!) 136/97     Pulse Rate 83     Resp 18     Temp 98.9 F (37.2 C)     Temp Source Oral     SpO2 96 %     Weight 210 lb (95.3 kg)     Height 5' (1.524 m)     Head Circumference      Peak Flow      Pain Score 9     Pain Loc      Pain Edu?      Excl. in GC?      Constitutional: Alert and oriented. Well appearing and in no distress. Genitourinary: deferred Musculoskeletal: Nontender with normal range of motion in all  extremities. No joint effusions.  No lower extremity tenderness nor edema. Neurologic:  Normal speech and language. No gross focal neurologic deficits are appreciated. Speech is normal.  Skin:  Skin is warm, dry and intact. Ecchymoses noted in the area of the coccyx. Tender to palpation. No signs of infection Psychiatric: Mood and affect are normal. Speech and behavior are normal. Patient exhibits appropriate insight and judgment.   Procedures      INITIAL IMPRESSION / ASSESSMENT AND PLAN / ED COURSE  Pertinent labs & imaging results that were available during my care of the patient were reviewed by me and considered in my medical decision making (see chart for  details).  Topical Lidoderm patch applied, patient given Percocet here in emergency department and advised to follow-up with chronic pain management doctor in the morning. ____________________________________________   FINAL CLINICAL IMPRESSION(S) / ED DIAGNOSES  Final diagnoses:  Chronic back pain      Darci Current, MD 01/06/16 [phone]

## 2016-01-06 NOTE — ED Notes (Signed)
Pt presents to ED from home came by EMS plans on calling a taxi for a ride, c/o lower back pain coccyx radiates into left leg throbbing pain. Pt has chronic back pain, states received a "shot" on Monday for pain as she normally get them to help with pain. Pt reports was given a shot in the tailbone area, states she does not normally get shots there. Pt c/o increased pain above area where shot was given. Pt states took 6 Vicodin per Pain management clinic MD order today without relief Pt took 2 tabs at 8am,1pm, and 6pm. Pt is alert and orient and unlabored breathing. Resting in bed no distress noted. Pt given warm blankets. Pt states pain is 9/10 worse pain she has felt.

## 2016-01-06 NOTE — ED Notes (Signed)
MD at bedside. 

## 2016-01-20 ENCOUNTER — Encounter: Payer: Self-pay | Admitting: Pain Medicine

## 2016-01-20 ENCOUNTER — Ambulatory Visit: Payer: BC Managed Care – PPO | Attending: Pain Medicine | Admitting: Pain Medicine

## 2016-01-20 VITALS — BP 139/90 | HR 93 | Temp 98.6°F | Resp 18 | Ht 60.0 in | Wt 207.0 lb

## 2016-01-20 DIAGNOSIS — G43909 Migraine, unspecified, not intractable, without status migrainosus: Secondary | ICD-10-CM | POA: Insufficient documentation

## 2016-01-20 DIAGNOSIS — N859 Noninflammatory disorder of uterus, unspecified: Secondary | ICD-10-CM

## 2016-01-20 DIAGNOSIS — M533 Sacrococcygeal disorders, not elsewhere classified: Secondary | ICD-10-CM | POA: Diagnosis not present

## 2016-01-20 DIAGNOSIS — M79604 Pain in right leg: Secondary | ICD-10-CM | POA: Diagnosis present

## 2016-01-20 DIAGNOSIS — M5481 Occipital neuralgia: Secondary | ICD-10-CM

## 2016-01-20 DIAGNOSIS — M47816 Spondylosis without myelopathy or radiculopathy, lumbar region: Secondary | ICD-10-CM

## 2016-01-20 DIAGNOSIS — M79605 Pain in left leg: Secondary | ICD-10-CM | POA: Diagnosis present

## 2016-01-20 DIAGNOSIS — M545 Low back pain: Secondary | ICD-10-CM | POA: Insufficient documentation

## 2016-01-20 DIAGNOSIS — M5136 Other intervertebral disc degeneration, lumbar region: Secondary | ICD-10-CM | POA: Diagnosis not present

## 2016-01-20 DIAGNOSIS — N949 Unspecified condition associated with female genital organs and menstrual cycle: Secondary | ICD-10-CM

## 2016-01-20 MED ORDER — HYDROCODONE-ACETAMINOPHEN 10-325 MG PO TABS: ORAL_TABLET | ORAL | 0 refills | Status: DC

## 2016-01-20 NOTE — Progress Notes (Signed)
Safety precautions to be maintained throughout the outpatient stay will include: orient to surroundings, keep bed in low position, maintain call bell within reach at all times, provide assistance with transfer out of bed and ambulation.  

## 2016-01-20 NOTE — Patient Instructions (Addendum)
PLAN   Continue present medication hydrocodone acetaminophen  May consider lower lumbar facet medial branch nerve blocks with sacral foramen included for treatment of lumbar facet and sacroiliac joint mediated pain  F/U PCP  Dr.Bliss  for evaliation of  BP and general medical condition  F/U surgical evaluation. Follow-up with surgeon status post surgery as planned  F/U neurological evaluation. May consider PNCV/EMG studies and other studies pending follow-up evaluations  May consider radiofrequency rhizolysis or intraspinal procedures pending response to present treatment and F/U evaluation   Patient to call pain management prior to scheduled return appointment should there be change in condition or should patient have concerns prior to scheduled return  Hydrocodone script given to patient.

## 2016-01-20 NOTE — Progress Notes (Signed)
     The patient is a 50 year old female who returns to pain management for further evaluation and treatment of pain involving the lower back and lower extremity regions. The patient states that the lower back pain radiates to the buttocks and is aggravated by standing walking twisting turning maneuvers. Patient denies any recent trauma change in events of daily living the call significant change in symptomatology. We reviewed patient's treatment and patient appears to be with improvement of pain with lumbar facet medial branch nerve blocks as well as sacroiliac joint injections. We will proceed with interventional treatment at time return appointment to include lumbar facet medial branch nerve blocks and locked in the regions of the sacroiliac joint and sacral foramen. The patient was with understanding and agreed to suggested treatment plan. The patient will continue hydrocodone acetaminophen as prescribed. All agreed with suggested treatment plan.      Physical examination  Tenderness of the splenius capitis and occipitalis region a mild degree with mild tenderness of the cervical facet cervical paraspinal musculature region. Palpation of the acromioclavicular and glenohumeral joint regions reproduces minimal discomfort as well. The patient appeared to be with bilaterally equal grip strength without significant increase of pain with Tinel and Phalen's maneuver. Palpation in the region of the thoracic region was attends to palpation with crepitus of the thoracic region noted. Palpation over the region of the lumbar region was attends to palpation of moderate degree with moderate tenderness of the PSIS PII S region gluteal and piriformis musculature regions. The patient was with significant increase of pain with pressure applied to the ileum with patient in lateral decubitus position. Straight leg raising was tolerates approximately 20 without a definite increase of pain with dorsiflexion noted lateral  bending and extension and palpation of the lumbar facets reproduce moderate to moderately severe discomfort.Marland Kitchen. DTRs appeared to be trace at the knees. There was negative clonus negative Homans. Abdomen nontender with no costovertebral tenderness noted.     Assessment   Sacroiliac joint dysfunction  Degenerative disc disease lumbar spine  Lumbar facet syndrome  Bilateral occipital neuralgia     PLAN   Continue present medication hydrocodone acetaminophen  May consider lower lumbar facet medial branch nerve blocks with sacral foramen included for treatment of lumbar facet and sacroiliac joint mediated pain  F/U PCP  Dr.Bliss  for evaliation of  BP and general medical condition  F/U surgical evaluation. Follow-up with surgeon status post surgery as planned  F/U neurological evaluation. May consider PNCV/EMG studies and other studies pending follow-up evaluations  May consider radiofrequency rhizolysis or intraspinal procedures pending response to present treatment and F/U evaluation   Patient to call pain management prior to scheduled return appointment should there be change in condition or should patient have concerns prior to scheduled return  Migraine

## 2016-02-16 ENCOUNTER — Ambulatory Visit: Payer: BC Managed Care – PPO | Attending: Pain Medicine | Admitting: Pain Medicine

## 2016-02-16 ENCOUNTER — Encounter: Payer: Self-pay | Admitting: Pain Medicine

## 2016-02-16 VITALS — BP 134/88 | HR 79 | Temp 97.4°F | Ht 60.0 in | Wt 210.0 lb

## 2016-02-16 DIAGNOSIS — N949 Unspecified condition associated with female genital organs and menstrual cycle: Secondary | ICD-10-CM

## 2016-02-16 DIAGNOSIS — M533 Sacrococcygeal disorders, not elsewhere classified: Secondary | ICD-10-CM | POA: Insufficient documentation

## 2016-02-16 DIAGNOSIS — M5481 Occipital neuralgia: Secondary | ICD-10-CM | POA: Insufficient documentation

## 2016-02-16 DIAGNOSIS — M545 Low back pain: Secondary | ICD-10-CM | POA: Diagnosis present

## 2016-02-16 DIAGNOSIS — M47816 Spondylosis without myelopathy or radiculopathy, lumbar region: Secondary | ICD-10-CM

## 2016-02-16 DIAGNOSIS — M5136 Other intervertebral disc degeneration, lumbar region: Secondary | ICD-10-CM | POA: Diagnosis not present

## 2016-02-16 DIAGNOSIS — N859 Noninflammatory disorder of uterus, unspecified: Secondary | ICD-10-CM

## 2016-02-16 MED ORDER — HYDROCODONE-ACETAMINOPHEN 10-325 MG PO TABS: ORAL_TABLET | ORAL | 0 refills | Status: DC

## 2016-02-16 NOTE — Progress Notes (Signed)
Safety precautions to be maintained throughout the outpatient stay will include: orient to surroundings, keep bed in low position, maintain call bell within reach at all times, provide assistance with transfer out of bed and ambulation.  

## 2016-02-16 NOTE — Patient Instructions (Signed)
PLAN   Continue present medication hydrocodone acetaminophen  May consider lower lumbar facet medial branch nerve blocks with sacral foramen included for treatment of lumbar facet and sacroiliac joint mediated pain  F/U PCP  Dr.Bliss  for evaluation of  BP and general medical condition  F/U surgical evaluation. Follow-up with surgeon status post surgery as planned  F/U neurological evaluation. May consider PNCV/EMG studies and other studies pending follow-up evaluations  May consider radiofrequency rhizolysis or intraspinal procedures pending response to present treatment and F/U evaluation   Patient to call pain management prior to scheduled return appointment should there be change in condition or should patient have concerns prior to scheduled return

## 2016-02-17 NOTE — Progress Notes (Signed)
    The patient is a 50 year old female who returns to pain management for further evaluation and treatment of pain involving the lower back lower extremity region. On today's visit the patient identified pain to be predominant in the region of the coccyx. The patient said significant improvement of pain occurring across the lower back region in the region of the sacroiliac joint following prior interventional treatment. We discussed patient's condition and we will consider patient for coccygeal nerve blocks of pain involving the region of the coccyx. At the present time we will continue present medication hydrocodone acetaminophen. The patient denies any trauma change in events of daily living the call significant change in symptomatology. The patient's pain is aggravated by sitting as well. We discussed patient obtaining a doughnut the sitting to avoid excessive pressure on the region of the coccyx and we will consider interventional treatment pending response to treatment and follow-up evaluation. All agreed to suggested treatment plan.     Physical examination    There was tends to palpation of the paraspinal misreading cervical region cervical facet region of minimal degree with minimal tenderness of the splenius capitis and occipitalis region. There was minimal tenderness of the acromioclavicular and glenohumeral joint regions. Palpation over the region of the thoracic facet thoracic paraspinal musculatures region reproduced minimal discomfort with no crepitus of the thoracic region noted. Palpation over the lumbar paraspinal must reason lumbar facet region associated with mild to moderate discomfort with lateral bending rotation extension and palpation of the lumbar facets reproducing mild to moderate discomfort. Palpation of the PSIS and PII S region reproduced mild to moderate discomfort. There was moderately to moderately severe tenderness to palpation of the coccyx. Palpation of the greater  trochanteric region iliotibial band region reproduced mild discomfort as well. Straight leg raising was tolerated to 30 without increased pain with dorsiflexion noted. DTRs appeared to be 1+ at the knees. There was negative clonus negative Homans. There was mild increased pain lumbar region and buttocks with pressure applied to the ileum with patient in lateral decubitus position. No sensory deficit or dermatomal distribution detected. There was negative clonus negative Homans. Abdomen nontender with no costovertebral tenderness noted.     Assessment     Sacroiliac joint dysfunction  Degenerative disc disease lumbar spine  Lumbar facet syndrome  Bilateral occipital neuralgia     PLAN   Continue present medication hydrocodone acetaminophen  May consider lower lumbar facet medial branch nerve blocks with sacral foramen included for treatment of lumbar facet and sacroiliac joint mediated pain  F/U PCP  Dr.Bliss  for evaluation of  BP and general medical condition  F/U surgical evaluation. Follow-up with surgeon status post surgery as planned  F/U neurological evaluation. May consider PNCV/EMG studies and other studies pending follow-up evaluations  May consider radiofrequency rhizolysis or intraspinal procedures pending response to present treatment and F/U evaluation   Patient to call pain management prior to scheduled return appointment should there be change in condition or should patient have concerns prior to scheduled return

## 2016-02-26 ENCOUNTER — Emergency Department
Admission: EM | Admit: 2016-02-26 | Discharge: 2016-02-26 | Disposition: A | Discharge status: Home / Self Care | Payer: BC Managed Care – PPO | Attending: Emergency Medicine | Admitting: Emergency Medicine

## 2016-02-26 ENCOUNTER — Emergency Department: Payer: BC Managed Care – PPO

## 2016-02-26 ENCOUNTER — Encounter: Payer: Self-pay | Admitting: *Deleted

## 2016-02-26 DIAGNOSIS — S50312A Abrasion of left elbow, initial encounter: Secondary | ICD-10-CM | POA: Insufficient documentation

## 2016-02-26 DIAGNOSIS — Z79899 Other long term (current) drug therapy: Secondary | ICD-10-CM | POA: Diagnosis not present

## 2016-02-26 DIAGNOSIS — S8992XA Unspecified injury of left lower leg, initial encounter: Secondary | ICD-10-CM | POA: Diagnosis present

## 2016-02-26 DIAGNOSIS — Y9389 Activity, other specified: Secondary | ICD-10-CM | POA: Diagnosis not present

## 2016-02-26 DIAGNOSIS — W010XXA Fall on same level from slipping, tripping and stumbling without subsequent striking against object, initial encounter: Secondary | ICD-10-CM | POA: Insufficient documentation

## 2016-02-26 DIAGNOSIS — Y92000 Kitchen of unspecified non-institutional (private) residence as  the place of occurrence of the external cause: Secondary | ICD-10-CM | POA: Diagnosis not present

## 2016-02-26 DIAGNOSIS — S80212A Abrasion, left knee, initial encounter: Secondary | ICD-10-CM | POA: Diagnosis not present

## 2016-02-26 DIAGNOSIS — Y999 Unspecified external cause status: Secondary | ICD-10-CM | POA: Insufficient documentation

## 2016-02-26 DIAGNOSIS — T148XXA Other injury of unspecified body region, initial encounter: Secondary | ICD-10-CM

## 2016-02-26 DIAGNOSIS — W19XXXA Unspecified fall, initial encounter: Secondary | ICD-10-CM

## 2016-02-26 DIAGNOSIS — M25562 Pain in left knee: Secondary | ICD-10-CM

## 2016-02-26 MED ORDER — ETODOLAC 200 MG PO CAPS: 200.0000 mg | ORAL_CAPSULE | Freq: Three times a day (TID) | ORAL | 0 refills | Status: DC

## 2016-02-26 MED ORDER — LIDOCAINE 5 % EX PTCH
1.0000 | MEDICATED_PATCH | CUTANEOUS | Status: DC
  Administered 2016-02-26: 1 via TRANSDERMAL
  Filled 2016-02-26: qty 1

## 2016-02-26 MED ORDER — LIDOCAINE 5 % EX PTCH: 1.0000 | MEDICATED_PATCH | Freq: Two times a day (BID) | CUTANEOUS | 0 refills | Status: DC

## 2016-02-26 NOTE — ED Notes (Signed)
Pt okay to use her cane instead of crutches per EDP.

## 2016-02-26 NOTE — ED Notes (Signed)

## 2016-02-26 NOTE — ED Provider Notes (Signed)
[facility] Emergency Department Provider Note   ____________________________________________   First MD Initiated Contact with Patient 02/26/16 0533     (approximate)  I have reviewed the triage vital signs and the nursing notes.   HISTORY  Chief Complaint Fall    HPI Mariah Reeves is a 50 y.o. female who comes into the hospital today with some left knee pain. The patient reports that she could not sleep tonight so she got up and started getting lunch ready. She reports that while she was in the kitchen she turned and tripped over a stool. The patient fell onto her left knee. She attempted to put ice on it but it hurt too much. The patient took 2 Vicodin and was still having pain so she decided to call EMS to come and get checked out. She reports it hurts when she walks and rates her pain 8 out of 10 in intensity. The patient never had pain with her knees before. She did skin her left elbow and did have some initial right shoulder pain but that is improved. The patient is here for evaluation today.   Past Medical History:  Diagnosis Date  . Allergy   . Anomaly, uterus    tumor of uterus  . Bipolar 1 disorder (HCC)   . Chronic back pain   . Depression     Patient Active Problem List   Diagnosis Date Noted  . Endometrial disorder 07/08/2015  . Bilateral occipital neuralgia 03/02/2015  . DDD (degenerative disc disease), lumbar 12/24/2014  . Facet syndrome, lumbar 12/24/2014  . 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 CARPAL TUNNEL RELEASE Left   . CHOLECYSTECTOMY    . DILATATION & CURETTAGE/HYSTEROSCOPY WITH MYOSURE N/A 07/08/2015   Procedure: DILATATION & CURETTAGE/HYSTEROSCOPY WITH MYOSURE;  Surgeon: Nadara Mustard, MD;  Location: [facility] ORS;  Service: Gynecology;  Laterality: N/A;  . DILATION AND CURETTAGE OF UTERUS     1/17  . GASTRIC BYPASS    . KNEE SURGERY    . MIDDLE EAR SURGERY      . NASAL SEPTUM SURGERY      Prior to Admission medications   Medication Sig Start Date End Date Taking? Authorizing Provider  ALPRAZolam Prudy Feeler) 0.5 MG tablet Take 0.5 mg by mouth every morning. Reported on 09/23/2015    Historical Provider, MD  cefUROXime (CEFTIN) 250 MG tablet Take 1 tablet (250 mg total) by mouth 2 (two) times daily with a meal. Patient not taking: Reported on 12/21/2015 10/13/15   Ewing Schlein, MD  cefUROXime (CEFTIN) 250 MG tablet Take 1 tablet (250 mg total) by mouth 2 (two) times daily with a meal. Patient not taking: Reported on 12/21/2015 11/10/15   Ewing Schlein, MD  cefUROXime (CEFTIN) 250 MG tablet Take 1 tablet (250 mg total) by mouth 2 (two) times daily with a meal. Patient not taking: Reported on 01/03/2016 01/03/16   Ewing Schlein, MD  diazepam (VALIUM) 5 MG tablet Take 1 tablet (5 mg total) by mouth every 8 (eight) hours as needed for muscle spasms. Patient not taking: Reported on 02/16/2016 05/14/15   Emily Filbert, MD  escitalopram (LEXAPRO) 20 MG tablet Take 20 mg by mouth at bedtime. Reported on 07/08/2015    Historical Provider, MD  etodolac (LODINE) 200 MG capsule Take 1 capsule (200 mg total) by mouth every 8 (eight) hours. 02/26/16   Rebecka Apley, MD  ferrous sulfate 325 (65 FE)  MG tablet Take 650 mg by mouth daily.    Historical Provider, MD  gabapentin (NEURONTIN) 600 MG tablet Take 600 mg by mouth 2 (two) times daily.    Historical Provider, MD  HYDROcodone-acetaminophen [facility]) 10-325 MG tablet Limit one tablet by mouth 3-5 times per day if tolerated 02/16/16   Ewing Schlein, MD  lamoTRIgine (LAMICTAL) 200 MG tablet Take 200 mg by mouth daily. Reported on 11/18/2015    Historical Provider, MD  lidocaine (LIDODERM) 5 % Place 1 patch onto the skin every 12 (twelve) hours. Remove & Discard patch within 12 hours or as directed by MD 02/26/16 02/25/17  Rebecka Apley, MD  meclizine (ANTIVERT) 25 MG tablet Take 1 tablet (25 mg total) by mouth 3 (three) times  daily as needed for dizziness or nausea. Patient not taking: Reported on 02/16/2016 05/14/15   Emily Filbert, MD  Multiple Vitamins-Minerals (MULTIVITAMIN PO) Take 1 tablet by mouth daily.    Historical Provider, MD  ondansetron (ZOFRAN-ODT) 4 MG disintegrating tablet Take 4 mg by mouth 3 (three) times daily as needed for nausea or vomiting. Reported on 11/18/2015    Historical Provider, MD  traZODone (DESYREL) 100 MG tablet Take 100 mg by mouth at bedtime as needed.     Historical Provider, MD    Allergies Contrast media [iodinated diagnostic agents] and Tape  Family History  Problem Relation Age of Onset  . Hypertension Mother   . Diabetes Mother   . Heart disease Father   . Alcohol abuse Father   . Breast cancer Maternal Grandmother     Social History Social History  Substance Use Topics  . Smoking status: Never Smoker  . Smokeless tobacco: Never Used  . Alcohol use No    Review of Systems Constitutional: No fever/chills Eyes: No visual changes. ENT: No sore throat. Cardiovascular: Denies chest pain. Respiratory: Denies shortness of breath. Gastrointestinal: No abdominal pain.  No nausea, no vomiting.  No diarrhea.  No constipation. Genitourinary: Negative for dysuria. Musculoskeletal: Left Knee pain Skin: Negative for rash. Neurological: Negative for headaches, focal weakness or numbness.  10-point ROS otherwise negative.  ____________________________________________   PHYSICAL EXAM:  VITAL SIGNS: ED Triage Vitals  Enc Vitals Group     BP 02/26/16 0506 114/77     Pulse Rate 02/26/16 0506 95     Resp 02/26/16 0506 18     Temp 02/26/16 0506 98 F (36.7 C)     Temp Source 02/26/16 0506 Oral     SpO2 02/26/16 0506 93 %     Weight 02/26/16 0506 205 lb (93 kg)     Height 02/26/16 0506 5' (1.524 m)     Head Circumference --      Peak Flow --      Pain Score 02/26/16 0507 8     Pain Loc --      Pain Edu? --      Excl. in GC? --     Constitutional: Alert  and oriented. Well appearing and in Mild distress. Eyes: Conjunctivae are normal. PERRL. EOMI. Head: Atraumatic. Nose: No congestion/rhinnorhea. Neck: No cervical spine tenderness to palpation Mouth/Throat: Mucous membranes are moist.  Oropharynx non-erythematous. Cardiovascular: Normal rate, regular rhythm. Grossly normal heart sounds.  Good peripheral circulation. Respiratory: Normal respiratory effort.  No retractions. Lungs CTAB. Gastrointestinal: Soft and nontender. No distention. Positive bowel sounds Musculoskeletal: Left anterior knee tenderness to palpation. Pain with extension of the left knee. Mild abrasion and erythema to the left knee.  Neurologic:  Normal speech and language. . Skin:  Abrasion to left elbow Psychiatric: Mood and affect are normal.   ____________________________________________   LABS (all labs ordered are listed, but only abnormal results are displayed)  Labs Reviewed - No data to display ____________________________________________  EKG  None ____________________________________________  RADIOLOGY  Left knee x-ray ____________________________________________   PROCEDURES  Procedure(s) performed: None  Procedures  Critical Care performed: No  ____________________________________________   INITIAL IMPRESSION / ASSESSMENT AND PLAN / ED COURSE  Pertinent labs & imaging results that were available during my care of the patient were reviewed by me and considered in my medical decision making (see chart for details).  This is a 50 year old female who comes into the hospital today with some left knee pain after a fall. The patient has some erythema and abrasion to her left knee but we will do an x-ray. I'll give the patient a shot of Toradol as well as a Lidoderm patch. I will reassess the patient after the medication and the imaging.  Clinical Course  Value Comment By Time  DG Knee Complete 4 Views Left Negative. Rebecka ApleyAllison P Webster, MD  09/16 [phone]   The patient's left knee x-ray is unremarkable. I did place the patient in a knee immobilizer and give her some crutches. She really takes hydrocodone at home but I will give her some knee total lack for pain at home and some Lidoderm patches. I will have the patient follow up with orthopedic surgery for further evaluation of her left knee pain. Patient has no further complaints or questions she'll be discharged home.  ____________________________________________   FINAL CLINICAL IMPRESSION(S) / ED DIAGNOSES  Final diagnoses:  Fall, initial encounter  Abrasion  Left knee pain      NEW MEDICATIONS STARTED DURING THIS VISIT:  New Prescriptions   ETODOLAC (LODINE) 200 MG CAPSULE    Take 1 capsule (200 mg total) by mouth every 8 (eight) hours.   LIDOCAINE (LIDODERM) 5 %    Place 1 patch onto the skin every 12 (twelve) hours. Remove & Discard patch within 12 hours or as directed by MD     Note:  This document was prepared using Dragon voice recognition software and may include unintentional dictation errors.    Rebecka ApleyAllison P Webster, MD 02/26/16 [phone]

## 2016-02-26 NOTE — ED Triage Notes (Signed)
Pt states she suffered a mechanical fall, tripped over a stool, at 0230 this morning. Pt c/o L knee and L elbow pain. Pt reported by EMS to be ambulatory from inside her home to the ambulance w/o gait difficulties. Pt states she took Vicodin x 2 tabs at 0330, without relief. Pt denies recent hx of falls. Pt denies syncope and LoC.

## 2016-04-09 ENCOUNTER — Other Ambulatory Visit: Payer: Self-pay | Admitting: Pain Medicine

## 2016-04-26 ENCOUNTER — Ambulatory Visit: Payer: BC Managed Care – PPO | Admitting: Physical Therapy

## 2016-05-03 ENCOUNTER — Ambulatory Visit: Payer: BC Managed Care – PPO | Attending: Orthopedic Surgery

## 2016-05-03 DIAGNOSIS — M25562 Pain in left knee: Secondary | ICD-10-CM | POA: Insufficient documentation

## 2016-05-03 NOTE — Patient Instructions (Signed)
KNEE: Quadriceps - Prone    Place strap around left ankle. Bring ankle toward buttocks. Press hip into surface. Hold _30__ seconds. _3__ reps per set, _2__ sets per session. Performed 2-3 times daily  Quad Set    With other leg bent, foot flat, slowly tighten muscles on L thigh of straight leg while counting out loud to __5__. 10 reps per set, 2 sets per session. Repeat with other leg. Performed 2-3 times daily.    ABDUCTION: Side-Lying (Active)    Lie on right side, top leg straight. Raise top leg as far as possible. 10 reps per set, 2 sets per session. Repeat with other leg. Performed 2-3 times daily.

## 2016-05-03 NOTE — Therapy (Signed)
[place] [facility] [address] [place], [place], [address] Phone: [phone]   Fax:  [phone]  Physical Therapy Evaluation  Patient Details  Name: Mariah Reeves MRN: [id number] Date of Birth: [birth year] Referring Provider: Martha ClanKrasinski  Encounter Date: 05/03/2016      PT End of Session - 05/03/16 0925    Visit Number 1   Number of Visits 9   Date for PT Re-Evaluation 05/31/16   Authorization Type no g codes   PT Start Time 0845   PT Stop Time 0940   PT Time Calculation (min) 55 min   Activity Tolerance Patient tolerated treatment well   Behavior During Therapy [facility] for tasks assessed/performed      Past Medical History:  Diagnosis Date  . Allergy   . Anomaly, uterus    tumor of uterus  . Bipolar 1 disorder (HCC)   . Chronic back pain   . Depression     Past Surgical History:  Procedure Laterality Date  . CARPAL TUNNEL RELEASE Left   . CHOLECYSTECTOMY    . DILATATION & CURETTAGE/HYSTEROSCOPY WITH MYOSURE N/A 07/08/2015   Procedure: DILATATION & CURETTAGE/HYSTEROSCOPY WITH MYOSURE;  Surgeon: Nadara Mustardobert P Harris, MD;  Location: [facility] ORS;  Service: Gynecology;  Laterality: N/A;  . DILATION AND CURETTAGE OF UTERUS     1/17  . GASTRIC BYPASS    . KNEE SURGERY    . MIDDLE EAR SURGERY    . NASAL SEPTUM SURGERY      There were no vitals filed for this visit.       Subjective Assessment - 05/03/16 0852    Subjective L knee pain   Pertinent History Mariah Reeves is a 50 y.o. female who reports that on 02/26/16 while she was in the kitchen she turned and tripped over a stool. The patient fell onto her left knee. She attempted to put ice on it but it hurt too much. The patient took 2 Vicodin and was still having pain so she decided to call EMS to come and get checked out. The patient had radiographs without notable abnormalities. She was placed in a knee immobilizer and instructed to follow-up with orthopedics. The orthopedist repeated  the radiographs and was found to have a small bone chip on L lateral patella. Pain is improving but she has intermittent bad days. ROS negative for red flags. Denies knee instability, clicking/locking, or knee giving out   Limitations Sitting   How long can you sit comfortably? 30 minutes   How long can you stand comfortably? 30 minutes   How long can you walk comfortably? 30 minutes   Diagnostic tests radiographs   Patient Stated Goals Decrease pain, ascend stairs with greater ease   Currently in Pain? Yes   Pain Score 3   Best: 0/10, worst: 6/10   Pain Location Knee   Pain Orientation Left   Pain Descriptors / Indicators Sharp   Pain Type Acute pain   Pain Radiating Towards None   Pain Onset More than a month ago   Pain Frequency Intermittent   Aggravating Factors  going up stairs, bending knee with driving, extended walking   Pain Relieving Factors Resting, vicoden   Effect of Pain on Daily Activities No limitations just increased pain   Multiple Pain Sites No  Not pertient to current episode. Chronic coccydynia per patient            [facility] - 05/03/16 0901  Assessment   Medical Diagnosis Closed fracture of L patella   Referring Provider Krasinski   Onset Date/Surgical Date 02/26/16   Hand Dominance Right   Next MD Visit December 2017   Prior Therapy Yes for R knee pain     Precautions   Precautions None     Restrictions   Weight Bearing Restrictions No     Balance Screen   Has the patient fallen in the past 6 months Yes   How many times? 4  Pt reports recently she has been losing her balance   Has the patient had a decrease in activity level because of a fear of falling?  No   Is the patient reluctant to leave their home because of a fear of falling?  No     Home Nurse, mental healthnvironment   Living Environment Private residence   Living Arrangements Alone   Available Help at Discharge Friend(s)   Type of Home Mobile home   Home Access Stairs to enter    Entrance Stairs-Number of Steps 4   Entrance Stairs-Rails Right   Home Layout One level   Home Equipment [employer] [employer] - single point   Additional Comments prn use of spc     Prior Function   Level of Independence Independent   Vocation On disability   Leisure playing on the computer     Cognition   Overall Cognitive Status Within Functional Limits for tasks assessed     Observation/Other Assessments   Other Surveys  Other Surveys   Lower Extremity Functional Scale  32     Sensation   Additional Comments Intact to light touch throughout bilateral LE     Posture/Postural Control   Posture Comments Forward head and rounded shoulder but otherwise no gross abnormalities     ROM / Strength   AROM / PROM / Strength PROM;Strength     PROM   Overall PROM Comments Pt with bilateral knee hyperextension, painless. L knee flexion is 125 actively, 130 passively. Pain with overpressure into flexion. Reports some mild L patella pain with quad set, relieved with towel roll under knee. HS length 70 on RLE and 65 on LLE.  Hip PROM appears grossly symmetrical bilateral and at least 35-40 degrees. Mariah Reeves test is positive for some mild L patella pain. Quad length is only very mildly limited bilaterally. Negative Lachmans, negative posterior drawer, negative medial and lateral ligamentous laxity but mild L lateral pain reported during lateral to medial direction force on L knee. Negative McMurray's     Strength   Strength Assessment Site Hip;Knee;Ankle   Right/Left Hip Right;Left   Right Hip Flexion 4/5   Right Hip Extension 4-/5   Right Hip External Rotation  4-/5   Right Hip Internal Rotation 4-/5   Right Hip ABduction 4-/5   Right Hip ADduction 4-/5   Left Hip Flexion 4/5   Left Hip Extension 4-/5   Left Hip External Rotation 4-/5   Left Hip Internal Rotation 4-/5   Left Hip ABduction 4-/5   Left Hip ADduction 4-/5   Right/Left Knee Right;Left   Right Knee Flexion 5/5   Right Knee Extension 5/5    Left Knee Flexion 5/5   Left Knee Extension 5/5  Mild L patella pain reported with extension   Right/Left Ankle Right;Left   Right Ankle Dorsiflexion 4+/5   Left Ankle Plantar Flexion 4+/5     Palpation   Patella mobility Deferred due to fracture   Palpation comment Lateral joint line tenderness to  L knee. Mild pain to palpation along lateral patella. Bony prominence just lateral to L patella noted during palpation     Transfers   Comments Independent with sit to stand transfers without UE support     Ambulation/Gait   Gait Comments Mildly antalgic gait on LLE. Significant hip drop noted bilaterally during ambulation        TREATMENT  THER-EX Supine quad sets 3 x 10 with towel roll under heel, no pain; Sidelying hip abduction 3 x 10 bilateral; Prone L quad stretch 30 second hold x 3; Pt issued written HEP with verbal and tactile cues and education about proper performance    .                   PT Education - 05/03/16 0925    Education provided Yes   Education Details HEP   Person(s) Educated Patient   Methods Explanation;Demonstration;Tactile cues;Verbal cues;Handout   Comprehension Verbalized understanding;Returned demonstration             PT Long Term Goals - 05/03/16 1610      PT LONG TERM GOAL #1   Title Pt will be independent with HEP in order to improve strength and balance in order to decrease fall risk and improve function at home and work.   Time 4   Period Weeks   Status New     PT LONG TERM GOAL #2   Title Pt will report worst pain in L patella as 3/10 indicating clinically significant reduction in pain   Baseline 05/03/16: worst 6/10   Time 4   Period Weeks   Status New     PT LONG TERM GOAL #3   Title Pt will increase LEFS by at least 9 points in order to demonstrate significant improvement in lower extremity function   Baseline 05/03/16: 32/80   Time 4   Period Weeks   Status New     PT LONG TERM GOAL #4   Title Pt will  improve hip abduction, adduction, and extension strength by 1/2 MMT grade in order to improve knee stability   Baseline 05/03/16: 4-/5 in all planes   Time 4   Period Weeks   Status New               Plan - 05/03/16 0926    Clinical Impression Statement Pt is a pleasant 50 yo female referred for closed L patellar fracture. Pt reports she sufferred a fall in September 2017 with a small bone chip on the lateral porition of her patella. PT evaluation today reveals some lateral L patella and lateral joint line tenderness with palpation. L knee AROM/PROM is full with some mild pain with overpressure into L knee flexion. No ligamentous laxity appreciated. Patellar mobility not assessed due to healing closed patellar fracture. Mild L patella tenderness with quad set and gentle quad stretch. Bilateral hip weakness with abduction, adduction, extension, IR, and ER. Gait is midly antalgic on LLE with bilateral hip drop due to proximal weakness. Pt with benefit from skilled PT services for LLE strength in order to decrease pain and resume full function. Overall pt reports that her symptoms are improving and she is optimistic regarding prognosis.    Rehab Potential Excellent   Clinical Impairments Affecting Rehab Potential Positive: age, motivation, improving symptoms; Negative; psychosocial factors   PT Frequency 2x / week   PT Duration 4 weeks   PT Treatment/Interventions ADLs/Self Care Home Management;Aquatic Therapy;Cryotherapy;Electrical Stimulation;Iontophoresis 4mg /ml Dexamethasone;Moist Heat;Gait training;Therapeutic activities;Therapeutic  exercise;Balance training;Neuromuscular re-education;Patient/family education;Manual techniques;Passive range of motion;Dry needling   PT Next Visit Plan Progress quad and hip strengthening   PT Home Exercise Plan quad sets, prone quad stretch, sidelying hip abduction   Consulted and Agree with Plan of Care Patient      Patient will benefit from skilled  therapeutic intervention in order to improve the following deficits and impairments:  Pain, Abnormal gait  Visit Diagnosis: Acute pain of left knee - Plan: PT plan of care cert/re-cert     Problem List Patient Active Problem List   Diagnosis Date Noted  . Endometrial disorder 07/08/2015  . Bilateral occipital neuralgia 03/02/2015  . DDD (degenerative disc disease), lumbar 12/24/2014  . Facet syndrome, lumbar 12/24/2014  . Sacroiliac joint dysfunction 12/24/2014   Mariah Reeves PT, DPT   Mariah Reeves 05/03/2016, 10:23 AM  [facility] [address] [place], [place], [address] Phone: [phone]   Fax:  [phone]  Name: Mariah Reeves MRN: [id number] Date of Birth: 1966/02/29

## 2016-05-08 ENCOUNTER — Encounter: Payer: Self-pay | Admitting: Physical Therapy

## 2016-05-08 ENCOUNTER — Ambulatory Visit: Payer: BC Managed Care – PPO | Admitting: Physical Therapy

## 2016-05-08 DIAGNOSIS — M25562 Pain in left knee: Secondary | ICD-10-CM | POA: Diagnosis not present

## 2016-05-08 NOTE — Therapy (Signed)
[facility] [address] [place], [place], [address] Phone: [phone]   Fax:  [phone]  Physical Therapy Treatment  Patient Details  Name: Mariah Reeves MRN: [id number] Date of Birth: [birth year] Referring Provider: Martha ClanKrasinski  Encounter Date: 05/08/2016      PT End of Session - 05/08/16 1604    Visit Number 2   Number of Visits 9   Date for PT Re-Evaluation 05/31/16   PT Start Time 1554   PT Stop Time 1649   PT Time Calculation (min) 55 min   Activity Tolerance Patient tolerated treatment well   Behavior During Therapy [facility] for tasks assessed/performed      Past Medical History:  Diagnosis Date  . Allergy   . Anomaly, uterus    tumor of uterus  . Bipolar 1 disorder (HCC)   . Chronic back pain   . Depression     Past Surgical History:  Procedure Laterality Date  . CARPAL TUNNEL RELEASE Left   . CHOLECYSTECTOMY    . DILATATION & CURETTAGE/HYSTEROSCOPY WITH MYOSURE N/A 07/08/2015   Procedure: DILATATION & CURETTAGE/HYSTEROSCOPY WITH MYOSURE;  Surgeon: Nadara Mustardobert P Harris, MD;  Location: [facility] ORS;  Service: Gynecology;  Laterality: N/A;  . DILATION AND CURETTAGE OF UTERUS     1/17  . GASTRIC BYPASS    . KNEE SURGERY    . MIDDLE EAR SURGERY    . NASAL SEPTUM SURGERY      There were no vitals filed for this visit.      Subjective Assessment - 05/08/16 1555    Subjective Pt. states her back is out today.  Pt. states she had a difficult time getting out of bath tub prior to PT tx. session.  Pt. entered PT gym with a more normalized gait pattern and did not complain of knee pain.  Pt. focused on back pain at this time.     Pertinent History Mariah Reeves is a 50 y.o. female who reports that on 02/26/16 while she was in the kitchen she turned and tripped over a stool. The patient fell onto her left knee. She attempted to put ice on it but it hurt too much. The patient took 2 Vicodin and was still having pain so she decided to  call EMS to come and get checked out. The patient had radiographs without notable abnormalities. She was placed in a knee immobilizer and instructed to follow-up with orthopedics. The orthopedist repeated the radiographs and was found to have a small bone chip on L lateral patella. Pain is improving but she has intermittent bad days. ROS negative for red flags. Denies knee instability, clicking/locking, or knee giving out   Limitations Sitting   How long can you sit comfortably? 30 minutes   How long can you stand comfortably? 30 minutes   How long can you walk comfortably? 30 minutes   Diagnostic tests radiographs   Patient Stated Goals Decrease pain, ascend stairs with greater ease   Currently in Pain? Yes   Pain Score 5    Pain Location Back   Pain Orientation Lower       OBJECTIVE:  Reviewed HEP (no changes).  Nustep L6 10 min. B UE/LE (cuing to maintain B LE in midline position).  BOSU lunges with holds L/R and step ups 20x with min. UE assist in //-bars.  Walking in clinic with assessment of gait pattern/ hip and knee flexion.  Manual tx.: Supine L LE hamstring/ gastroc/ hip stretches  9 min.  L patellar mobs (all planes)- lateral knee tenderness.  STM to L distal quad/ hamstring and proximal gastroc.      Pt response for medical necessity: benefits from L knee stretches/ strengthening ex. Program to improve pain-free mobility.  Pt. Ambulates with more normalized gait pattern since initial evaluation.  PT recommends icing L knee to decrease inflammation/ soreness/ tenderness.        PT Long Term Goals - 05/03/16 [id number]      PT LONG TERM GOAL #1   Title Pt will be independent with HEP in order to improve strength and balance in order to decrease fall risk and improve function at home and work.   Time 4   Period Weeks   Status New     PT LONG TERM GOAL #2   Title Pt will report worst pain in L patella as 3/10 indicating clinically significant reduction in pain   Baseline 05/03/16:  worst 6/10   Time 4   Period Weeks   Status New     PT LONG TERM GOAL #3   Title Pt will increase LEFS by at least 9 points in order to demonstrate significant improvement in lower extremity function   Baseline 05/03/16: 32/80   Time 4   Period Weeks   Status New     PT LONG TERM GOAL #4   Title Pt will improve hip abduction, adduction, and extension strength by 1/2 MMT grade in order to improve knee stability   Baseline 05/03/16: 4-/5 in all planes   Time 4   Period Weeks   Status New            Plan - 05/08/16 1604    Clinical Impression Statement Increase L knee pain with lateral knee tenderness and lateral to medial knee tenderness.  No swelling or bruising noted in L knee.  L knee flexion 122 deg. AROM in supine position with no increase c/o pain.  Minimal L LE muscle weakness/ fatigue noted but no increase c/o pain with ROM and ther.ex.      Rehab Potential Excellent   Clinical Impairments Affecting Rehab Potential Positive: age, motivation, improving symptoms; Negative; psychosocial factors   PT Frequency 2x / week   PT Duration 4 weeks   PT Treatment/Interventions ADLs/Self Care Home Management;Aquatic Therapy;Cryotherapy;Electrical Stimulation;Iontophoresis 4mg /ml Dexamethasone;Moist Heat;Gait training;Therapeutic activities;Therapeutic exercise;Balance training;Neuromuscular re-education;Patient/family education;Manual techniques;Passive range of motion;Dry needling   PT Next Visit Plan Progress quad and hip strengthening   PT Home Exercise Plan quad sets, prone quad stretch, sidelying hip abduction   Consulted and Agree with Plan of Care Patient      Patient will benefit from skilled therapeutic intervention in order to improve the following deficits and impairments:  Pain, Improper body mechanics, Decreased mobility, Decreased activity tolerance, Decreased endurance, Decreased range of motion, Decreased strength, Hypomobility, Decreased balance, Difficulty  walking  Visit Diagnosis: Acute pain of left knee     Problem List Patient Active Problem List   Diagnosis Date Noted  . Endometrial disorder 07/08/2015  . Bilateral occipital neuralgia 03/02/2015  . DDD (degenerative disc disease), lumbar 12/24/2014  . Facet syndrome, lumbar 12/24/2014  . Sacroiliac joint dysfunction 12/24/2014   Cammie McgeeMichael C Australia Droll, PT, DPT # [phone] 05/09/2016, 1:28 PM  [facility] [address] [place], [place], [address] Phone: [phone]   Fax:  [phone]  Name: Mariah Reeves MRN: [id number] Date of Birth: 28-Jan-1966

## 2016-05-10 ENCOUNTER — Ambulatory Visit: Payer: BC Managed Care – PPO | Admitting: Physical Therapy

## 2016-05-15 ENCOUNTER — Encounter: Payer: BC Managed Care – PPO | Admitting: Physical Therapy

## 2016-05-17 ENCOUNTER — Ambulatory Visit: Payer: BC Managed Care – PPO | Attending: Orthopedic Surgery | Admitting: Physical Therapy

## 2016-05-17 ENCOUNTER — Encounter: Payer: Self-pay | Admitting: Physical Therapy

## 2016-05-17 DIAGNOSIS — M25562 Pain in left knee: Secondary | ICD-10-CM | POA: Diagnosis present

## 2016-05-17 NOTE — Therapy (Addendum)
[place] [facility] [address] [place], [place], [address] Phone: [phone]   Fax:  [phone]  Physical Therapy Treatment  Patient Details  Name: Mariah AlbertsJoy A Kary MRN: [id number] Date of Birth: Jan 10, 1966 Referring Provider: Martha ClanKrasinski  Encounter Date: 05/17/2016      PT End of Session - 05/17/16 1551    Visit Number 3   Number of Visits 9   Date for PT Re-Evaluation 05/31/16   PT Start Time 1552   PT Stop Time 1644   PT Time Calculation (min) 52 min   Activity Tolerance Patient tolerated treatment well   Behavior [facility] for tasks assessed/performed      Past Medical History:  Diagnosis Date  . Allergy   . Anomaly, uterus    tumor of uterus  . Bipolar 1 disorder (HCC)   . Chronic back pain   . Depression     Past Surgical History:  Procedure Laterality Date  . CARPAL TUNNEL RELEASE Left   . CHOLECYSTECTOMY    . DILATATION & CURETTAGE/HYSTEROSCOPY WITH MYOSURE N/A 07/08/2015   Procedure: DILATATION & CURETTAGE/HYSTEROSCOPY WITH MYOSURE;  Surgeon: Nadara Mustardobert P Harris, MD;  Location: [facility] ORS;  Service: Gynecology;  Laterality: N/A;  . DILATION AND CURETTAGE OF UTERUS     1/17  . GASTRIC BYPASS    . KNEE SURGERY    . MIDDLE EAR SURGERY    . NASAL SEPTUM SURGERY      There were no vitals filed for this visit.      Subjective Assessment - 05/17/16 1551    Pertinent History Mariah A Daphine DeutscherMartin is a 50 y.o. female who reports that on 02/26/16 while she was in the kitchen she turned and tripped over a stool. The patient fell onto her left knee. She attempted to put ice on it but it hurt too much. The patient took 2 Vicodin and was still having pain so she decided to call EMS to come and get checked out. The patient had radiographs without notable abnormalities. She was placed in a knee immobilizer and instructed to follow-up with orthopedics. The orthopedist repeated the radiographs and was found to have a small bone chip on L  lateral patella. Pain is improving but she has intermittent bad days. ROS negative for red flags. Denies knee instability, clicking/locking, or knee giving out   Limitations Sitting   How long can you sit comfortably? 30 minutes   How long can you stand comfortably? 30 minutes   How long can you walk comfortably? 30 minutes   Diagnostic tests radiographs   Patient Stated Goals Decrease pain, ascend stairs with greater ease     Pt. reports no new issues with knees but states she is dealing with painful dental issues.  Pt. unable to afford root canal at this time and having difficulty chewing/eating.  Pt. c/o 4/10 L lateral knee pain at rest.     OBJECTIVE:  Discussed HEP.  Nustep L6 10 min. B UE/LE (cuing to maintain B LE in midline position)- warm-up/ no charge.  BOSU lunges with holds L/R and step ups 20x with min. UE assist in //-bars.  Walking in clinic with discussion/ focus on consistent step pattern, heel strike and cadence (no increase pain).  Walking partial lunges in hallway 40 feet x 2.  TG knee flexion (neutral/toe in/toe out)- 20x each/ heel raises 20x.  3" plinth step ups/overs on L/R 10x2 each.  Reverse BOSU: wt. Shifting/ partial squats with no UE  assist (as tolerated).  Manual tx.: Supine L LE hamstring/ gastroc/ hip stretches 8 min.  L patellar mobs (all planes)- lateral knee tenderness.  STM to L distal quad/ hamstring and proximal gastroc.                 Pt response for medical necessity: benefits from L knee stretches/ strengthening ex. Program to improve pain-free mobility.  Pt. Ambulates with more normalized gait pattern since initial evaluation.  PT recommends icing L knee to decrease inflammation/ soreness/ tenderness.    Pt. able to complete higher level quad/LE stability ex. without an increase in pain.  Good knee control and able to maneuver around TG/ equipment without knee discomfort.  Pt. more focused on dental pain at this time.        PT Long Term Goals -  05/03/16 [id number]      PT LONG TERM GOAL #1   Title Pt will be independent with HEP in order to improve strength and balance in order to decrease fall risk and improve function at home and work.   Time 4   Period Weeks   Status New     PT LONG TERM GOAL #2   Title Pt will report worst pain in L patella as 3/10 indicating clinically significant reduction in pain   Baseline 05/03/16: worst 6/10   Time 4   Period Weeks   Status New     PT LONG TERM GOAL #3   Title Pt will increase LEFS by at least 9 points in order to demonstrate significant improvement in lower extremity function   Baseline 05/03/16: 32/80   Time 4   Period Weeks   Status New     PT LONG TERM GOAL #4   Title Pt will improve hip abduction, adduction, and extension strength by 1/2 MMT grade in order to improve knee stability   Baseline 05/03/16: 4-/5 in all planes   Time 4   Period Weeks   Status New            Plan - 05/17/16 1552    Rehab Potential Excellent   Clinical Impairments Affecting Rehab Potential Positive: age, motivation, improving symptoms; Negative; psychosocial factors   PT Frequency 2x / week   PT Duration 4 weeks   PT Treatment/Interventions ADLs/Self Care Home Management;Aquatic Therapy;Cryotherapy;Electrical Stimulation;Iontophoresis 4mg /ml Dexamethasone;Moist Heat;Gait training;Therapeutic activities;Therapeutic exercise;Balance training;Neuromuscular re-education;Patient/family education;Manual techniques;Passive range of motion;Dry needling   PT Next Visit Plan Progress quad and hip strengthening   PT Home Exercise Plan quad sets, prone quad stretch, sidelying hip abduction   Consulted and Agree with Plan of Care Patient      Patient will benefit from skilled therapeutic intervention in order to improve the following deficits and impairments:  Pain, Improper body mechanics, Decreased mobility, Decreased activity tolerance, Decreased endurance, Decreased range of motion, Decreased  strength, Hypomobility, Decreased balance, Difficulty walking  Visit Diagnosis: Acute pain of left knee     Problem List Patient Active Problem List   Diagnosis Date Noted  . Endometrial disorder 07/08/2015  . Bilateral occipital neuralgia 03/02/2015  . DDD (degenerative disc disease), lumbar 12/24/2014  . Facet syndrome, lumbar 12/24/2014  . Sacroiliac joint dysfunction 12/24/2014   Cammie McgeeMichael C Tacy Chavis, PT, DPT # [id number] 05/18/2016, 5:11 PM  [facility] [address] [place], [place], [address] Phone: [phone]   Fax:  [phone]  Name: Mariah AlbertsJoy A Reeves MRN: [id number] Date of Birth: 07/09/1965

## 2016-05-23 ENCOUNTER — Ambulatory Visit: Payer: BC Managed Care – PPO | Admitting: Physical Therapy

## 2016-05-23 DIAGNOSIS — M25562 Pain in left knee: Secondary | ICD-10-CM | POA: Diagnosis not present

## 2016-05-23 NOTE — Therapy (Signed)
[place] [facility] [address] [place], [place], [address] Phone: [phone]   Fax:  [phone]  Physical Therapy Treatment  Patient Details  Name: Mariah Reeves MRN: [id number] Date of Birth: 1966-05-22 Referring Provider: Mack Guise  Encounter Date: 05/23/2016      PT End of Session - 05/23/16 0813    Visit Number 4   Number of Visits 9   Date for PT Re-Evaluation 05/31/16   Authorization Type no g codes   PT Start Time 0807   PT Stop Time 0901   PT Time Calculation (min) 54 min   Activity Tolerance Patient tolerated treatment well   Behavior During Therapy [facility] for tasks assessed/performed      Past Medical History:  Diagnosis Date  . Allergy   . Anomaly, uterus    tumor of uterus  . Bipolar 1 disorder ([place])   . Chronic back pain   . Depression     Past Surgical History:  Procedure Laterality Date  . CARPAL TUNNEL RELEASE Left   . CHOLECYSTECTOMY    . DILATATION & CURETTAGE/HYSTEROSCOPY WITH MYOSURE N/A 07/08/2015   Procedure: DILATATION & CURETTAGE/HYSTEROSCOPY WITH MYOSURE;  Surgeon: Gae Dry, MD;  Location: [facility] ORS;  Service: Gynecology;  Laterality: N/A;  . DILATION AND CURETTAGE OF UTERUS     1/17  . GASTRIC BYPASS    . KNEE SURGERY    . MIDDLE EAR SURGERY    . NASAL SEPTUM SURGERY      There were no vitals filed for this visit.      Subjective Assessment - 05/23/16 0809    Subjective Pt. states she is doing alright.  Pt. dealing with dental issues and trying to wait for root canal until next year for insurance issues.  Pt. reports 6/10 L knee pain at rest prior to tx. session.  No knee swelling noted.     Pertinent History Mariah Reeves is a 50 y.o. female who reports that on 02/26/16 while she was in the kitchen she turned and tripped over a stool. The patient fell onto her left knee. She attempted to put ice on it but it hurt too much. The patient took 2 Vicodin and was still having pain so she decided  to call EMS to come and get checked out. The patient had radiographs without notable abnormalities. She was placed in a knee immobilizer and instructed to follow-up with orthopedics. The orthopedist repeated the radiographs and was found to have a small bone chip on L lateral patella. Pain is improving but she has intermittent bad days. ROS negative for red flags. Denies knee instability, clicking/locking, or knee giving out   Limitations Sitting   How long can you sit comfortably? 30 minutes   How long can you stand comfortably? 30 minutes   How long can you walk comfortably? 30 minutes   Diagnostic tests radiographs   Patient Stated Goals Decrease pain, ascend stairs with greater ease   Currently in Pain? Yes   Pain Score 6    Pain Location Knee   Pain Orientation Left   Pain Descriptors / Indicators Aching;Sharp   Pain Type Chronic pain      OBJECTIVE:  Scifit L6 10 min. B UE/LE (cuing to maintain B LE in midline position)- warm-up/no charge.  Walking in clinic with focus on consistent step pattern/ cadence.  Walking in hallway with high marching/ lateral stepping/ braiding/ toe walking/tandem gait in //-bars. 3" plinth step downs/ backs 10x2.  TG knee flexion/ heel raises 30x each.  Good patellar tracking noted in B knees.  5/10 LBP and 6/10 L knee pain during all there.ex. (no worsening c/o pain).  Supine reassessment of knee/patellar mobility.  Discussed/reviewed HEP.                    Pt. response for medical necessity: benefits from L knee stretches/ strengthening ex. Program to improve pain-free mobility.  Pt. Ambulates with more normalized gait pattern since initial evaluation.  PT recommends icing L knee to decrease inflammation/ soreness/ tenderness.  Probable discharge with HEP focus after next tx. Session.        PT Long Term Goals - 05/24/16 0801      PT LONG TERM GOAL #1   Title Pt will be independent with HEP in order to improve strength and balance in order to decrease  fall risk and improve function at home and work.   Time 4   Period Weeks   Status Achieved     PT LONG TERM GOAL #2   Title Pt will report worst pain in L patella as 3/10 indicating clinically significant reduction in pain   Baseline 05/23/16: worst 6/10   Time 4   Period Weeks   Status Not Met     PT LONG TERM GOAL #3   Title Pt will increase LEFS by at least 9 points in order to demonstrate significant improvement in lower extremity function   Baseline 05/03/16: 32/80   Time 4   Period Weeks   Status On-going     PT LONG TERM GOAL #4   Title Pt will improve hip abduction, adduction, and extension strength by 1/2 MMT grade in order to improve knee stability   Baseline 05/23/16: 4+/5 in all planes   Time 4   Period Weeks   Status Partially Met               Plan - 05/23/16 0814    Clinical Impression Statement No tenderness or swelling in L knee today.  Pt. ambulates with improved gait pattern with slight antalgic gait noted but may be due to back discomfort, not knee.  Pt. able to ascend/descend stairs with recip. pattern and no increase c/o pain (good patellar tracking).  Good patellar mobility noted during manual reassessment in supine position.  Pt. understands importance of quad strengthening for knee control/ mobility.  Probable discharge next vist if all goals met.     Rehab Potential Excellent   Clinical Impairments Affecting Rehab Potential Positive: age, motivation, improving symptoms; Negative; psychosocial factors   PT Frequency 2x / week   PT Duration 4 weeks   PT Treatment/Interventions ADLs/Self Care Home Management;Aquatic Therapy;Cryotherapy;Electrical Stimulation;Iontophoresis '4mg'$ /ml Dexamethasone;Moist Heat;Gait training;Therapeutic activities;Therapeutic exercise;Balance training;Neuromuscular re-education;Patient/family education;Manual techniques;Passive range of motion;Dry needling   PT Next Visit Plan Progress quad and hip strengthening.  SENT MD  note.  Discuss MD f/u visit.     PT Home Exercise Plan quad sets, prone quad stretch, sidelying hip abduction   Consulted and Agree with Plan of Care Patient      Patient will benefit from skilled therapeutic intervention in order to improve the following deficits and impairments:  Pain, Improper body mechanics, Decreased mobility, Decreased activity tolerance, Decreased endurance, Decreased range of motion, Decreased strength, Hypomobility, Decreased balance, Difficulty walking  Visit Diagnosis: Acute pain of left knee     Problem List Patient Active Problem List   Diagnosis Date Noted  . Endometrial disorder 07/08/2015  .  Bilateral occipital neuralgia 03/02/2015  . DDD (degenerative disc disease), lumbar 12/24/2014  . Facet syndrome, lumbar 12/24/2014  . Sacroiliac joint dysfunction 12/24/2014   Pura Spice, PT, DPT # [id number] 05/24/2016, 8:03 AM  [facility] [address] [place], [place], [address] Phone: [phone]   Fax:  [phone]  Name: Mariah Reeves MRN: [id number] Date of Birth: [birth year]

## 2016-05-25 ENCOUNTER — Ambulatory Visit: Payer: BC Managed Care – PPO | Admitting: Physical Therapy

## 2016-05-31 ENCOUNTER — Ambulatory Visit: Payer: BC Managed Care – PPO | Admitting: Physical Therapy

## 2016-10-17 ENCOUNTER — Emergency Department: Payer: BC Managed Care – PPO

## 2016-10-17 ENCOUNTER — Emergency Department
Admission: EM | Admit: 2016-10-17 | Discharge: 2016-10-17 | Disposition: A | Discharge status: Home / Self Care | Payer: BC Managed Care – PPO | Attending: Emergency Medicine | Admitting: Emergency Medicine

## 2016-10-17 ENCOUNTER — Encounter: Payer: Self-pay | Admitting: *Deleted

## 2016-10-17 DIAGNOSIS — Y999 Unspecified external cause status: Secondary | ICD-10-CM | POA: Diagnosis not present

## 2016-10-17 DIAGNOSIS — Z79899 Other long term (current) drug therapy: Secondary | ICD-10-CM | POA: Insufficient documentation

## 2016-10-17 DIAGNOSIS — M25561 Pain in right knee: Secondary | ICD-10-CM | POA: Diagnosis not present

## 2016-10-17 DIAGNOSIS — M25562 Pain in left knee: Secondary | ICD-10-CM | POA: Diagnosis not present

## 2016-10-17 DIAGNOSIS — N200 Calculus of kidney: Secondary | ICD-10-CM | POA: Diagnosis not present

## 2016-10-17 DIAGNOSIS — R079 Chest pain, unspecified: Secondary | ICD-10-CM | POA: Diagnosis not present

## 2016-10-17 DIAGNOSIS — Y92410 Unspecified street and highway as the place of occurrence of the external cause: Secondary | ICD-10-CM | POA: Diagnosis not present

## 2016-10-17 DIAGNOSIS — M549 Dorsalgia, unspecified: Secondary | ICD-10-CM | POA: Diagnosis not present

## 2016-10-17 DIAGNOSIS — Y9389 Activity, other specified: Secondary | ICD-10-CM | POA: Diagnosis not present

## 2016-10-17 DIAGNOSIS — S299XXA Unspecified injury of thorax, initial encounter: Secondary | ICD-10-CM | POA: Diagnosis present

## 2016-10-17 LAB — COMPREHENSIVE METABOLIC PANEL
ALK PHOS: 65 U/L (ref 38–126)
ALT: 27 U/L (ref 14–54)
ANION GAP: 6 (ref 5–15)
AST: 39 U/L (ref 15–41)
Albumin: 3.8 g/dL (ref 3.5–5.0)
BUN: 22 mg/dL — ABNORMAL HIGH (ref 6–20)
CALCIUM: 9 mg/dL (ref 8.9–10.3)
CO2: 29 mmol/L (ref 22–32)
Chloride: 105 mmol/L (ref 101–111)
Creatinine, Ser: 0.66 mg/dL (ref 0.44–1.00)
GLUCOSE: 101 mg/dL — AB (ref 65–99)
POTASSIUM: 4.2 mmol/L (ref 3.5–5.1)
Sodium: 140 mmol/L (ref 135–145)
TOTAL PROTEIN: 6.9 g/dL (ref 6.5–8.1)
Total Bilirubin: 0.6 mg/dL (ref 0.3–1.2)

## 2016-10-17 LAB — CBC
HEMATOCRIT: 35.3 % (ref 35.0–47.0)
HEMOGLOBIN: 11.9 g/dL — AB (ref 12.0–16.0)
MCH: 28.6 pg (ref 26.0–34.0)
MCHC: 33.7 g/dL (ref 32.0–36.0)
MCV: 84.8 fL (ref 80.0–100.0)
Platelets: 276 10*3/uL (ref 150–440)
RBC: 4.17 MIL/uL (ref 3.80–5.20)
RDW: 14.5 % (ref 11.5–14.5)
WBC: 5.4 10*3/uL (ref 3.6–11.0)

## 2016-10-17 MED ORDER — MORPHINE SULFATE (PF) 2 MG/ML IV SOLN
2.0000 mg | Freq: Once | INTRAVENOUS | Status: AC
  Administered 2016-10-17: 2 mg via INTRAVENOUS
  Filled 2016-10-17: qty 1

## 2016-10-17 MED ORDER — KETOROLAC TROMETHAMINE 30 MG/ML IJ SOLN
30.0000 mg | Freq: Once | INTRAMUSCULAR | Status: AC
  Administered 2016-10-17: 30 mg via INTRAVENOUS
  Filled 2016-10-17: qty 1

## 2016-10-17 MED ORDER — ONDANSETRON HCL 4 MG/2ML IJ SOLN
4.0000 mg | Freq: Once | INTRAMUSCULAR | Status: AC
  Administered 2016-10-17: 4 mg via INTRAVENOUS
  Filled 2016-10-17: qty 2

## 2016-10-17 NOTE — ED Notes (Signed)
Pt provided grape juice per request.
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
## 2016-10-19 ENCOUNTER — Encounter: Payer: Self-pay | Admitting: Urology

## 2016-10-19 ENCOUNTER — Ambulatory Visit (INDEPENDENT_AMBULATORY_CARE_PROVIDER_SITE_OTHER): Payer: BC Managed Care – PPO | Admitting: Urology

## 2016-10-19 DIAGNOSIS — N200 Calculus of kidney: Secondary | ICD-10-CM | POA: Diagnosis not present

## 2016-10-19 MED ORDER — TAMSULOSIN HCL 0.4 MG PO CAPS: 0.4000 mg | ORAL_CAPSULE | Freq: Every day | ORAL | 0 refills | Status: DC
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
## 2016-11-09 ENCOUNTER — Ambulatory Visit: Payer: BC Managed Care – PPO

## 2016-11-21 ENCOUNTER — Other Ambulatory Visit: Payer: Self-pay

## 2016-11-21 MED ORDER — TAMSULOSIN HCL 0.4 MG PO CAPS: 0.4000 mg | ORAL_CAPSULE | Freq: Every day | ORAL | 2 refills | Status: DC

## 2016-12-01 ENCOUNTER — Ambulatory Visit (INDEPENDENT_AMBULATORY_CARE_PROVIDER_SITE_OTHER): Payer: BC Managed Care – PPO | Admitting: Urology

## 2016-12-01 ENCOUNTER — Encounter: Payer: Self-pay | Admitting: Urology

## 2016-12-01 ENCOUNTER — Ambulatory Visit
Admission: RE | Admit: 2016-12-01 | Discharge: 2016-12-01 | Disposition: A | Discharge status: Home / Self Care | Payer: BC Managed Care – PPO | Source: Ambulatory Visit | Attending: Urology | Admitting: Urology

## 2016-12-01 ENCOUNTER — Other Ambulatory Visit: Payer: Self-pay | Admitting: Radiology

## 2016-12-01 VITALS — BP 134/73 | HR 75 | Ht 60.0 in | Wt 194.1 lb

## 2016-12-01 DIAGNOSIS — N201 Calculus of ureter: Secondary | ICD-10-CM

## 2016-12-01 DIAGNOSIS — N200 Calculus of kidney: Secondary | ICD-10-CM | POA: Diagnosis not present
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
## 2016-12-03 LAB — URINE CULTURE: ORGANISM ID, BACTERIA: NO GROWTH

## 2016-12-06 ENCOUNTER — Encounter: Payer: Self-pay | Admitting: *Deleted

## 2016-12-06 MED ORDER — CIPROFLOXACIN HCL 500 MG PO TABS
500.0000 mg | ORAL_TABLET | ORAL | Status: AC
  Administered 2016-12-07: 500 mg via ORAL

## 2016-12-07 ENCOUNTER — Ambulatory Visit
Admission: RE | Admit: 2016-12-07 | Discharge: 2016-12-07 | Disposition: A | Discharge status: Home / Self Care | Payer: BC Managed Care – PPO | Source: Ambulatory Visit | Attending: Urology | Admitting: Urology

## 2016-12-07 ENCOUNTER — Encounter: Payer: Self-pay | Admitting: *Deleted

## 2016-12-07 ENCOUNTER — Encounter
Admission: RE | Disposition: A | Discharge status: Home / Self Care | Payer: Self-pay | Source: Ambulatory Visit | Attending: Urology

## 2016-12-07 ENCOUNTER — Ambulatory Visit: Payer: BC Managed Care – PPO

## 2016-12-07 DIAGNOSIS — F319 Bipolar disorder, unspecified: Secondary | ICD-10-CM | POA: Diagnosis not present

## 2016-12-07 DIAGNOSIS — Z79899 Other long term (current) drug therapy: Secondary | ICD-10-CM | POA: Insufficient documentation

## 2016-12-07 DIAGNOSIS — Z888 Allergy status to other drugs, medicaments and biological substances status: Secondary | ICD-10-CM | POA: Diagnosis not present

## 2016-12-07 DIAGNOSIS — N202 Calculus of kidney with calculus of ureter: Secondary | ICD-10-CM | POA: Insufficient documentation

## 2016-12-07 DIAGNOSIS — G4730 Sleep apnea, unspecified: Secondary | ICD-10-CM | POA: Diagnosis not present

## 2016-12-07 DIAGNOSIS — Z9884 Bariatric surgery status: Secondary | ICD-10-CM | POA: Diagnosis not present

## 2016-12-07 DIAGNOSIS — E669 Obesity, unspecified: Secondary | ICD-10-CM | POA: Diagnosis not present

## 2016-12-07 DIAGNOSIS — Z8249 Family history of ischemic heart disease and other diseases of the circulatory system: Secondary | ICD-10-CM | POA: Diagnosis not present

## 2016-12-07 DIAGNOSIS — Z87442 Personal history of urinary calculi: Secondary | ICD-10-CM | POA: Insufficient documentation

## 2016-12-07 DIAGNOSIS — Z6837 Body mass index (BMI) 37.0-37.9, adult: Secondary | ICD-10-CM | POA: Diagnosis not present

## 2016-12-07 DIAGNOSIS — Z91041 Radiographic dye allergy status: Secondary | ICD-10-CM | POA: Diagnosis not present

## 2016-12-07 DIAGNOSIS — N201 Calculus of ureter: Secondary | ICD-10-CM

## 2016-12-07 HISTORY — DX: Occipital neuralgia: M54.81

## 2016-12-07 HISTORY — DX: Unspecified fracture of unspecified patella, initial encounter for closed fracture: S82.009A

## 2016-12-07 HISTORY — DX: Other intervertebral disc degeneration, lumbar region: M51.36

## 2016-12-07 HISTORY — DX: Other intervertebral disc degeneration, lumbar region without mention of lumbar back pain or lower extremity pain: M51.369

## 2016-12-07 HISTORY — PX: EXTRACORPOREAL SHOCK WAVE LITHOTRIPSY: SHX1557

## 2016-12-07 HISTORY — DX: Spondylosis without myelopathy or radiculopathy, lumbar region: M47.816

## 2016-12-07 LAB — POCT PREGNANCY, URINE: Preg Test, Ur: NEGATIVE

## 2016-12-07 SURGERY — LITHOTRIPSY, ESWL
Anesthesia: Moderate Sedation | Laterality: Left

## 2016-12-07 MED ORDER — CIPROFLOXACIN HCL 500 MG PO TABS
ORAL_TABLET | ORAL | Status: AC
  Administered 2016-12-07: 500 mg via ORAL
  Filled 2016-12-07: qty 1

## 2016-12-07 MED ORDER — DEXTROSE-NACL 5-0.45 % IV SOLN
INTRAVENOUS | Status: DC
  Administered 2016-12-07: 12:00:00 via INTRAVENOUS

## 2016-12-07 MED ORDER — DIAZEPAM 5 MG PO TABS
10.0000 mg | ORAL_TABLET | ORAL | Status: AC
  Administered 2016-12-07: 10 mg via ORAL

## 2016-12-07 MED ORDER — ONDANSETRON HCL 4 MG/2ML IJ SOLN
4.0000 mg | Freq: Once | INTRAMUSCULAR | Status: AC
  Administered 2016-12-07: 4 mg via INTRAVENOUS

## 2016-12-07 MED ORDER — DIPHENHYDRAMINE HCL 25 MG PO CAPS
ORAL_CAPSULE | ORAL | Status: AC
Start: 2016-12-07 — End: 2016-12-07
  Administered 2016-12-07: 25 mg via ORAL
  Filled 2016-12-07: qty 1

## 2016-12-07 MED ORDER — DIPHENHYDRAMINE HCL 25 MG PO CAPS
25.0000 mg | ORAL_CAPSULE | ORAL | Status: AC
  Administered 2016-12-07: 25 mg via ORAL

## 2016-12-07 MED ORDER — DIAZEPAM 5 MG PO TABS
ORAL_TABLET | ORAL | Status: AC
Start: 2016-12-07 — End: 2016-12-07
  Administered 2016-12-07: 10 mg via ORAL
  Filled 2016-12-07: qty 2

## 2016-12-07 MED ORDER — ONDANSETRON HCL 4 MG/2ML IJ SOLN
INTRAMUSCULAR | Status: AC
  Administered 2016-12-07: 4 mg via INTRAVENOUS
  Filled 2016-12-07: qty 2

## 2016-12-07 NOTE — Interval H&P Note (Signed)
History and Physical Interval Note:  12/07/2016 11:51 AM  Mariah Reeves  has presented today for surgery, with the diagnosis of Kidney stone  The various methods of treatment have been discussed with the patient and family. After consideration of risks, benefits and other options for treatment, the patient has consented to  Procedure(s): EXTRACORPOREAL SHOCK WAVE LITHOTRIPSY (ESWL) (Left) as a surgical intervention .  The patient's history has been reviewed, patient examined, no change in status, stable for surgery.  I have reviewed the patient's chart and labs.  Questions were answered to the patient's satisfaction.     Hildred LaserBrian James [name]
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
## 2016-12-07 NOTE — Discharge Instructions (Signed)
General Anesthesia, Adult, Care After °These instructions provide you with information about caring for yourself after your procedure. Your health care provider may also give you more specific instructions. Your treatment has been planned according to current medical practices, but problems sometimes occur. Call your health care provider if you have any problems or questions after your procedure. °What can I expect after the procedure? °After the procedure, it is common to have: °· Vomiting. °· A sore throat. °· Mental slowness. ° °It is common to feel: °· Nauseous. °· Cold or shivery. °· Sleepy. °· Tired. °· Sore or achy, even in parts of your body where you did not have surgery. ° °Follow these instructions at home: °For at least 24 hours after the procedure: °· Do not: °? Participate in activities where you could fall or become injured. °? Drive. °? Use heavy machinery. °? Drink alcohol. °? Take sleeping pills or medicines that cause drowsiness. °? Make important decisions or sign legal documents. °? Take care of children on your own. °· Rest. °Eating and drinking °· If you vomit, drink water, juice, or soup when you can drink without vomiting. °· Drink enough fluid to keep your urine clear or pale yellow. °· Make sure you have little or no nausea before eating solid foods. °· Follow the diet recommended by your health care provider. °General instructions °· Have a responsible adult stay with you until you are awake and alert. °· Return to your normal activities as told by your health care provider. Ask your health care provider what activities are safe for you. °· Take over-the-counter and prescription medicines only as told by your health care provider. °· If you smoke, do not smoke without supervision. °· Keep all follow-up visits as told by your health care provider. This is important. °Contact a health care provider if: °· You continue to have nausea or vomiting at home, and medicines are not helpful. °· You  cannot drink fluids or start eating again. °· You cannot urinate after 8-12 hours. °· You develop a skin rash. °· You have fever. °· You have increasing redness at the site of your procedure. °Get help right away if: °· You have difficulty breathing. °· You have chest pain. °· You have unexpected bleeding. °· You feel that you are having a life-threatening or urgent problem. °This information is not intended to replace advice given to you by your health care provider. Make sure you discuss any questions you have with your health care provider. °Document Released: 09/04/2000 Document Revised: 11/01/2015 Document Reviewed: 05/13/2015 °Elsevier Interactive Patient Education © 2018 [employer] ° °

## 2016-12-08 ENCOUNTER — Encounter: Payer: Self-pay | Admitting: Urology

## 2016-12-11 ENCOUNTER — Telehealth: Payer: Self-pay | Admitting: Radiology

## 2016-12-11 ENCOUNTER — Other Ambulatory Visit: Payer: Self-pay | Admitting: Radiology

## 2016-12-11 NOTE — Telephone Encounter (Signed)
Pt c/o still having pain following ESWL done 12/07/16 & doesn't feel that she has passed any fragments. Per Michiel CowboyShannon McGowan, advised pt to have KUB done & to increase fluid intake. Pt voices understanding.

## 2016-12-12 ENCOUNTER — Ambulatory Visit
Admission: RE | Admit: 2016-12-12 | Discharge: 2016-12-12 | Disposition: A | Discharge status: Home / Self Care | Payer: BC Managed Care – PPO | Source: Ambulatory Visit | Attending: Urology | Admitting: Urology

## 2016-12-12 DIAGNOSIS — N201 Calculus of ureter: Secondary | ICD-10-CM | POA: Insufficient documentation

## 2016-12-12 DIAGNOSIS — N200 Calculus of kidney: Secondary | ICD-10-CM | POA: Diagnosis present

## 2016-12-14 ENCOUNTER — Telehealth: Payer: Self-pay | Admitting: Radiology

## 2016-12-14 NOTE — Telephone Encounter (Signed)
Pt requests KUB results & plan of care. Pt may be reached at [phone].

## 2016-12-14 NOTE — Telephone Encounter (Signed)
Per Dr Pilar Jarvis, notified pt that stone has not moved on xray & the options would be met or urs. Pt states she is taking Flomax & has hydrocodone available but doesn't take it when she has to drive. Pt was offered an appt on 12/15/16 but has plans so chooses to keep f/u appt with Dr Pilar Jarvis on 12/21/16.

## 2016-12-21 ENCOUNTER — Encounter: Payer: Self-pay | Admitting: Urology

## 2016-12-21 ENCOUNTER — Other Ambulatory Visit: Payer: Self-pay | Admitting: *Deleted

## 2016-12-21 ENCOUNTER — Ambulatory Visit
Admission: RE | Admit: 2016-12-21 | Discharge: 2016-12-21 | Disposition: A | Discharge status: Home / Self Care | Payer: BC Managed Care – PPO | Source: Ambulatory Visit | Attending: Urology | Admitting: Urology

## 2016-12-21 ENCOUNTER — Ambulatory Visit (INDEPENDENT_AMBULATORY_CARE_PROVIDER_SITE_OTHER): Payer: BC Managed Care – PPO | Admitting: Urology

## 2016-12-21 VITALS — BP 106/75 | HR 142 | Ht 60.0 in | Wt 190.4 lb

## 2016-12-21 DIAGNOSIS — N200 Calculus of kidney: Secondary | ICD-10-CM

## 2016-12-21 NOTE — Progress Notes (Signed)
12/21/2016 3:07 PM   Mariah Reeves Mariah Reeves [birth year] [id number]  Referring provider: Dortha KernBliss, Laura K, MD [address] [place], [place] [id number]  Chief Complaint  Patient presents with  . Follow-up    2 week  kidney stone    HPI: The patient is a 51 year old female who presents today for follow up of 6 mm mid to distal left ureteral stone. She also has a nonobstructing 3 mm stone in the middle left pole. She underwent lithotripsy in 12/07/2016 for the left ureteral calculus however due to the patient's inability to tolerate the procedure, she only received approximately 20% of the standard energy level. On KUB today, her left ureteral stone is in similar position as it was on the day of treatment in the distal ureter.   PMH: Past Medical History:  Diagnosis Date  . Allergy   . Anomaly, uterus    tumor of uterus  . Bilateral occipital neuralgia   . Bipolar 1 disorder (HCC)   . Chronic back pain   . Degenerative disc disease, lumbar   . Depression   . Facet syndrome, lumbar (HCC)   . Kidney stone   . Patella fracture     Surgical History: Past Surgical History:  Procedure Laterality Date  . ABDOMINOPLASTY  2006  . CARPAL TUNNEL RELEASE Left   . CHOLECYSTECTOMY    . DILATATION & CURETTAGE/HYSTEROSCOPY WITH MYOSURE N/A 07/08/2015   Procedure: DILATATION & CURETTAGE/HYSTEROSCOPY WITH MYOSURE;  Surgeon: Nadara Mustardobert P Harris, MD;  Location: [facility];  Service: Gynecology;  Laterality: N/A;  . DILATION AND CURETTAGE OF UTERUS     1/17  . EXTRACORPOREAL SHOCK WAVE LITHOTRIPSY Left 12/07/2016   Procedure: EXTRACORPOREAL SHOCK WAVE LITHOTRIPSY (ESWL);  Surgeon: Hildred LaserBudzyn, Frida Wahlstrom James, MD;  Location: [facility];  Service: Urology;  Laterality: Left;  Marland Kitchen. GASTRIC BYPASS    . KNEE SURGERY    . LITHOTRIPSY    . MIDDLE EAR SURGERY    . NASAL SEPTUM SURGERY    . URETEROSCOPY WITH HOLMIUM LASER LITHOTRIPSY     perforated ureter. had nephrostomy tube for brief time    Home Medications:  Allergies as  of 12/21/2016      Reactions   Contrast Media [iodinated Diagnostic Agents] Hives   Tape Hives      Medication List       Accurate as of 12/21/16  3:07 PM. Always use your most recent med list.          diazepam 5 MG tablet Commonly known as:  VALIUM Take 1 tablet (5 mg total) by mouth every 8 (eight) hours as needed for muscle spasms.   escitalopram 20 MG tablet Commonly known as:  LEXAPRO Take 20 mg by mouth at bedtime. Reported on 07/08/2015   ferrous sulfate 325 (65 FE) MG tablet Take 650 mg by mouth daily.   gabapentin 600 MG tablet Commonly known as:  NEURONTIN Take 600 mg by mouth as needed.   HYDROcodone-acetaminophen 10-325 MG tablet Commonly known as:  NORCO Limit one tablet by mouth 3-5 times per day if tolerated   lamoTRIgine 200 MG tablet Commonly known as:  LAMICTAL Take 400 mg by mouth at bedtime. Reported on 11/18/2015   lidocaine 5 % Commonly known as:  LIDODERM Place 1 patch onto the skin every 12 (twelve) hours. Remove & Discard patch within 12 hours or as directed by MD   meclizine 25 MG tablet Commonly known as:  ANTIVERT Take 1 tablet (25 mg total) by mouth  3 (three) times daily as needed for dizziness or nausea.   MULTIVITAMIN PO Take 1 tablet by mouth daily.   ondansetron 4 MG disintegrating tablet Commonly known as:  ZOFRAN-ODT Take 4 mg by mouth 3 (three) times daily as needed for nausea or vomiting. Reported on 11/18/2015   tamsulosin 0.4 MG Caps capsule Commonly known as:  FLOMAX Take 1 capsule (0.4 mg total) by mouth daily.   traZODone 100 MG tablet Commonly known as:  DESYREL Take 100 mg by mouth at bedtime as needed.       Allergies:  Allergies  Allergen Reactions  . Contrast Media [Iodinated Diagnostic Agents] Hives  . Tape Hives    Family History: Family History  Problem Relation Age of Onset  . Hypertension Mother   . Diabetes Mother   . Heart disease Father   . Alcohol abuse Father   . Breast cancer Maternal  Grandmother   . Kidney cancer Neg Hx   . Bladder Cancer Neg Hx     Social History:  reports that she has never smoked. She has never used smokeless tobacco. She reports that she does not drink alcohol or use drugs.  ROS: UROLOGY Frequent Urination?: No Hard to postpone urination?: No Burning/pain with urination?: No Get up at night to urinate?: No Leakage of urine?: No Urine stream starts and stops?: No Trouble starting stream?: No Do you have to strain to urinate?: No Blood in urine?: No Urinary tract infection?: No Sexually transmitted disease?: No Injury to kidneys or bladder?: No Painful intercourse?: No Weak stream?: No Currently pregnant?: No Vaginal bleeding?: No Last menstrual period?: 11/2016  Gastrointestinal Nausea?: Yes Vomiting?: No Indigestion/heartburn?: No Diarrhea?: Yes Constipation?: No  Constitutional Fever: No Night sweats?: No Weight loss?: No Fatigue?: No  Skin Skin rash/lesions?: No Itching?: No  Eyes Blurred vision?: No Double vision?: No  Ears/Nose/Throat Sore throat?: No Sinus problems?: No  Hematologic/Lymphatic Swollen glands?: No Easy bruising?: No  Cardiovascular Leg swelling?: No Chest pain?: No  Respiratory Cough?: No Shortness of breath?: No  Endocrine Excessive thirst?: No  Musculoskeletal Back pain?: Yes Joint pain?: No  Neurological Headaches?: No Dizziness?: No  Psychologic Depression?: No Anxiety?: No  Physical Exam: BP 106/75   Pulse (!) 142   Ht 5' (1.524 m)   Wt 190 lb 6.4 oz (86.4 kg)   LMP 11/24/2016   BMI 37.18 kg/m   Constitutional:  Alert and oriented, No acute distress. HEENT: [place] AT, moist mucus membranes.  Trachea midline, no masses. Cardiovascular: No clubbing, cyanosis, or edema. Respiratory: Normal respiratory effort, no increased work of breathing. GI: Abdomen is soft, nontender, nondistended, no abdominal masses GU: No CVA tenderness.  Skin: No rashes, bruises or  suspicious lesions. Lymph: No cervical or inguinal adenopathy. Neurologic: Grossly intact, no focal deficits, moving all 4 extremities. Psychiatric: Normal mood and affect.  Laboratory Data: Lab Results  Component Value Date   WBC 5.4 10/17/2016   HGB 11.9 (L) 10/17/2016   HCT 35.3 10/17/2016   MCV 84.8 10/17/2016   PLT 276 10/17/2016    Lab Results  Component Value Date   CREATININE 0.66 10/17/2016    No results found for: PSA  No results found for: TESTOSTERONE  No results found for: HGBA1C  Urinalysis    Component Value Date/Time   COLORURINE Yellow 05/19/2014 1628   APPEARANCEUR Clear 05/19/2014 1628   LABSPEC 1.021 05/19/2014 1628   PHURINE 5.0 05/19/2014 1628   GLUCOSEU Negative 05/19/2014 1628   HGBUR 1+ 05/19/2014  1628   BILIRUBINUR Negative 05/19/2014 1628   KETONESUR Negative 05/19/2014 1628   PROTEINUR Negative 05/19/2014 1628   NITRITE Negative 05/19/2014 1628   LEUKOCYTESUR Negative 05/19/2014 1628    Pertinent Imaging: KUB reviewed as above.  Assessment & Plan:    1. Left distal ureteral stone - 6 mm 2. Left nonobstructing midpole stone - 3 mm I discussed the patient that her stone is still present in the distal left ureter. We did discuss options which include continued metabolic expulsive therapy, ureteroscopy, and lithotripsy. I recommended against lithotripsy as she did not tolerate this well previously for this stone. She elected to proceed with cystoscopy, left ureteroscopy, laser lithotripsy, left stent. She does understand this procedure goes well that we may try to address a 3 mm stone in her left mid pole. She is very nervous about having a stent and would strongly prefer being able to remove the stent herself via string. Discussed the risks, benefits, indications of this procedure. She is agreeable to proceeding.    Giani Betzold James Kenyatte Gruber, MD  [facility] [address], [address] [place], [place] [address] [phone]  

## 2016-12-22 ENCOUNTER — Other Ambulatory Visit: Payer: Self-pay | Admitting: Radiology

## 2016-12-22 ENCOUNTER — Telehealth: Payer: Self-pay | Admitting: Radiology

## 2016-12-22 DIAGNOSIS — N201 Calculus of ureter: Secondary | ICD-10-CM

## 2016-12-22 NOTE — Telephone Encounter (Signed)
Pt scheduled for ureteroscopy, laser lithotripsy, ureteral stent placement with Dr Sherryl BartersBudzyn on 01/03/17. LMOM to notify pt of surgery, pre-admit testing & post op appt with Dr Sherryl BartersBudzyn. Requested pt return call to confirm.

## 2016-12-22 NOTE — Telephone Encounter (Signed)
Pt called to confirm appts. Needed to change pre-admit appt. This was done. Pt aware of new appt & voices understanding.

## 2016-12-23 LAB — URINE CULTURE

## 2016-12-25 ENCOUNTER — Inpatient Hospital Stay: Admission: RE | Admit: 2016-12-25 | Payer: BC Managed Care – PPO | Source: Ambulatory Visit

## 2016-12-26 ENCOUNTER — Encounter
Admission: RE | Admit: 2016-12-26 | Discharge: 2016-12-26 | Disposition: A | Discharge status: Home / Self Care | Payer: BC Managed Care – PPO | Source: Ambulatory Visit | Attending: Urology | Admitting: Urology

## 2016-12-26 DIAGNOSIS — Z01818 Encounter for other preprocedural examination: Secondary | ICD-10-CM | POA: Insufficient documentation

## 2016-12-26 HISTORY — DX: Personal history of other specified conditions: Z87.898

## 2016-12-26 HISTORY — DX: Anxiety disorder, unspecified: F41.9

## 2016-12-26 HISTORY — DX: Personal history of urinary calculi: Z87.442

## 2016-12-26 HISTORY — DX: Headache, unspecified: R51.9

## 2016-12-26 HISTORY — DX: Sleep apnea, unspecified: G47.30

## 2016-12-26 HISTORY — DX: Headache: R51

## 2016-12-26 LAB — DIFFERENTIAL
BASOS ABS: 0 10*3/uL (ref 0–0.1)
BASOS PCT: 1 %
Eosinophils Absolute: 0.2 10*3/uL (ref 0–0.7)
Eosinophils Relative: 2 %
Lymphocytes Relative: 29 %
Lymphs Abs: 2.1 10*3/uL (ref 1.0–3.6)
MONOS PCT: 8 %
Monocytes Absolute: 0.5 10*3/uL (ref 0.2–0.9)
NEUTROS ABS: 4.4 10*3/uL (ref 1.4–6.5)
Neutrophils Relative %: 60 %

## 2016-12-26 LAB — CBC
HEMATOCRIT: 40 % (ref 35.0–47.0)
Hemoglobin: 13.5 g/dL (ref 12.0–16.0)
MCH: 29 pg (ref 26.0–34.0)
MCHC: 33.8 g/dL (ref 32.0–36.0)
MCV: 85.7 fL (ref 80.0–100.0)
PLATELETS: 381 10*3/uL (ref 150–440)
RBC: 4.67 MIL/uL (ref 3.80–5.20)
RDW: 14.5 % (ref 11.5–14.5)
WBC: 7.2 10*3/uL (ref 3.6–11.0)

## 2016-12-26 NOTE — Patient Instructions (Signed)
  Your procedure is scheduled on:January 03, 2017 [facility](WEDNESDAY) Report to Same Day Surgery 2nd floor medical mall [facility](Medical Mall Entrance-take elevator on left to 2nd floor.  Check in with surgery information desk.) To find out your arrival time please call [phone] between 1PM - 3PM on January 02, 2017 [facility](TUESDAY)  Remember: Instructions that are not followed completely may result in serious medical risk, up to and including death, or upon the discretion of your surgeon and anesthesiologist your surgery may need to be rescheduled.    _x___ 1. Do not eat food or drink liquids after midnight. No gum chewing or  hard candies                                 __x__ 2. No Alcohol for 24 hours before or after surgery.   __x__3. No Smoking for 24 prior to surgery.   ____  4. Bring all medications with you on the day of surgery if instructed.    __x__ 5. Notify your doctor if there is any change in your medical condition     (cold, fever, infections).     Do not wear jewelry, make-up, hairpins, clips or nail polish.  Do not wear lotions, powders, or perfumes. You may wear deodorant.  Do not shave 48 hours prior to surgery. Men may shave face and neck.  Do not bring valuables to the hospital.    [facility] is not responsible for any belongings or valuables.               Contacts, dentures or bridgework may not be worn into surgery.  Leave your suitcase in the car. After surgery it may be brought to your room.  For patients admitted to the hospital, discharge time is determined by your treatment team                      Patients discharged the day of surgery will not be allowed to drive home.  You will need someone to drive you home and stay with you the night of your procedure.    Please read over the following fact sheets that you were given:   [facility] Preparing for Surgery and or MRSA Information   _x___ Take the following medication with a sip of water the morning of surgery :    1.  GABAPENTIN    ____Fleets enema or Magnesium Citrate as directed.   ___ Use CHG Soap or sage wipes as directed on instruction sheet   ____ Use inhalers on the day of surgery and bring to hospital day of surgery  ____ Stop Metformin and Janumet 2 days prior to surgery.    ____ Take 1/2 of usual insulin dose the night before surgery and none on the morning surgery       _x___ Follow recommendations from Cardiologist, Pulmonologist or PCP regarding          stopping Aspirin, Coumadin, Plavix ,Eliquis, Effient, or Pradaxa, and Pletal.  X____Stop Anti-inflammatories such as Advil, Aleve, Ibuprofen, Motrin, Naproxen, Naprosyn, Goodies powders or aspirin products. OK to take Tylenol    _x___ Stop supplements until after surgery.  But may continue Vitamin D, Vitamin B, and multivitamin         __x__ Bring C-Pap to the hospital.

## 2016-12-29 ENCOUNTER — Other Ambulatory Visit: Payer: Self-pay | Admitting: Family Medicine

## 2016-12-29 MED ORDER — OXYCODONE-ACETAMINOPHEN 5-325 MG PO TABS: 1.0000 | ORAL_TABLET | ORAL | 0 refills | Status: DC | PRN

## 2017-01-02 MED ORDER — CEFAZOLIN SODIUM-DEXTROSE 2-4 GM/100ML-% IV SOLN
2.0000 g | INTRAVENOUS | Status: AC
  Administered 2017-01-03: 2 g via INTRAVENOUS

## 2017-01-03 ENCOUNTER — Ambulatory Visit: Payer: BC Managed Care – PPO | Admitting: Anesthesiology

## 2017-01-03 ENCOUNTER — Encounter: Payer: Self-pay | Admitting: *Deleted

## 2017-01-03 ENCOUNTER — Observation Stay
Admission: RE | Admit: 2017-01-03 | Discharge: 2017-01-04 | Disposition: A | Discharge status: Home / Self Care | Payer: BC Managed Care – PPO | Source: Ambulatory Visit | Attending: Urology | Admitting: Urology

## 2017-01-03 ENCOUNTER — Encounter
Admission: RE | Disposition: A | Discharge status: Home / Self Care | Payer: Self-pay | Source: Ambulatory Visit | Attending: Urology

## 2017-01-03 DIAGNOSIS — F319 Bipolar disorder, unspecified: Secondary | ICD-10-CM | POA: Insufficient documentation

## 2017-01-03 DIAGNOSIS — N201 Calculus of ureter: Principal | ICD-10-CM | POA: Insufficient documentation

## 2017-01-03 DIAGNOSIS — M5136 Other intervertebral disc degeneration, lumbar region: Secondary | ICD-10-CM | POA: Diagnosis not present

## 2017-01-03 DIAGNOSIS — M5481 Occipital neuralgia: Secondary | ICD-10-CM | POA: Insufficient documentation

## 2017-01-03 DIAGNOSIS — Z9884 Bariatric surgery status: Secondary | ICD-10-CM | POA: Diagnosis not present

## 2017-01-03 DIAGNOSIS — R000 Tachycardia, unspecified: Secondary | ICD-10-CM | POA: Insufficient documentation

## 2017-01-03 DIAGNOSIS — Z79899 Other long term (current) drug therapy: Secondary | ICD-10-CM | POA: Insufficient documentation

## 2017-01-03 DIAGNOSIS — R509 Fever, unspecified: Secondary | ICD-10-CM | POA: Insufficient documentation

## 2017-01-03 HISTORY — PX: CYSTOSCOPY W/ URETERAL STENT PLACEMENT: SHX1429

## 2017-01-03 LAB — POCT PREGNANCY, URINE: PREG TEST UR: NEGATIVE

## 2017-01-03 SURGERY — CYSTOSCOPY, WITH RETROGRADE PYELOGRAM AND URETERAL STENT INSERTION
Anesthesia: General

## 2017-01-03 MED ORDER — HEPARIN SODIUM (PORCINE) 5000 UNIT/ML IJ SOLN: 5000.0000 [IU] | Freq: Three times a day (TID) | INTRAMUSCULAR | Status: DC

## 2017-01-03 MED ORDER — SODIUM CHLORIDE 0.9 % IV SOLN
2.0000 g | Freq: Four times a day (QID) | INTRAVENOUS | Status: AC
  Administered 2017-01-03 – 2017-01-04 (×3): 2 g via INTRAVENOUS
  Filled 2017-01-03 (×3): qty 2000

## 2017-01-03 MED ORDER — PHENYLEPHRINE HCL 10 MG/ML IJ SOLN
INTRAMUSCULAR | Status: DC | PRN
  Administered 2017-01-03 (×4): 200 ug via INTRAVENOUS
  Administered 2017-01-03 (×2): 100 ug via INTRAVENOUS

## 2017-01-03 MED ORDER — LACTATED RINGERS IV SOLN
INTRAVENOUS | Status: DC
  Administered 2017-01-03: 13:00:00 via INTRAVENOUS

## 2017-01-03 MED ORDER — FENTANYL CITRATE (PF) 100 MCG/2ML IJ SOLN
INTRAMUSCULAR | Status: AC
  Filled 2017-01-03: qty 2

## 2017-01-03 MED ORDER — LIDOCAINE HCL (PF) 2 % IJ SOLN
INTRAMUSCULAR | Status: AC
  Filled 2017-01-03: qty 2

## 2017-01-03 MED ORDER — TAMSULOSIN HCL 0.4 MG PO CAPS
0.4000 mg | ORAL_CAPSULE | Freq: Every day | ORAL | Status: DC
  Administered 2017-01-04: 0.4 mg via ORAL
  Filled 2017-01-03: qty 1

## 2017-01-03 MED ORDER — GENTAMICIN SULFATE 40 MG/ML IJ SOLN
120.0000 mg | INTRAVENOUS | Status: AC
  Administered 2017-01-03: 120 mg via INTRAVENOUS
  Filled 2017-01-03: qty 3

## 2017-01-03 MED ORDER — MECLIZINE HCL 25 MG PO TABS
25.0000 mg | ORAL_TABLET | Freq: Three times a day (TID) | ORAL | Status: DC | PRN
  Filled 2017-01-03: qty 1

## 2017-01-03 MED ORDER — DIAZEPAM 5 MG PO TABS: 5.0000 mg | ORAL_TABLET | Freq: Three times a day (TID) | ORAL | Status: DC | PRN

## 2017-01-03 MED ORDER — ONDANSETRON HCL 4 MG/2ML IJ SOLN
INTRAMUSCULAR | Status: AC
  Filled 2017-01-03: qty 2

## 2017-01-03 MED ORDER — ESCITALOPRAM OXALATE 10 MG PO TABS
20.0000 mg | ORAL_TABLET | Freq: Every day | ORAL | Status: DC
  Filled 2017-01-03: qty 1

## 2017-01-03 MED ORDER — SODIUM CHLORIDE 0.9 % IV SOLN
INTRAVENOUS | Status: DC
  Administered 2017-01-03 – 2017-01-04 (×2): via INTRAVENOUS

## 2017-01-03 MED ORDER — GENTAMICIN SULFATE 40 MG/ML IJ SOLN
INTRAMUSCULAR | Status: AC
Start: 2017-01-03 — End: 2017-01-03
  Filled 2017-01-03: qty 4

## 2017-01-03 MED ORDER — FENTANYL CITRATE (PF) 100 MCG/2ML IJ SOLN
INTRAMUSCULAR | Status: DC | PRN
  Administered 2017-01-03: 50 ug via INTRAVENOUS

## 2017-01-03 MED ORDER — IOTHALAMATE MEGLUMINE 43 % IV SOLN
INTRAVENOUS | Status: DC | PRN
  Administered 2017-01-03: 5 mL via URETHRAL

## 2017-01-03 MED ORDER — PROPOFOL 10 MG/ML IV BOLUS
INTRAVENOUS | Status: DC | PRN
  Administered 2017-01-03: 170 mg via INTRAVENOUS

## 2017-01-03 MED ORDER — TRAZODONE HCL 100 MG PO TABS: 100.0000 mg | ORAL_TABLET | Freq: Every evening | ORAL | Status: DC | PRN

## 2017-01-03 MED ORDER — GABAPENTIN 600 MG PO TABS
600.0000 mg | ORAL_TABLET | Freq: Every day | ORAL | Status: DC
  Administered 2017-01-04: 600 mg via ORAL
  Filled 2017-01-03: qty 1

## 2017-01-03 MED ORDER — LAMOTRIGINE 100 MG PO TABS: 400.0000 mg | ORAL_TABLET | Freq: Every day | ORAL | Status: DC

## 2017-01-03 MED ORDER — MORPHINE SULFATE (PF) 2 MG/ML IV SOLN: 2.0000 mg | INTRAVENOUS | Status: DC | PRN

## 2017-01-03 MED ORDER — FAMOTIDINE 20 MG PO TABS
20.0000 mg | ORAL_TABLET | Freq: Once | ORAL | Status: AC
  Administered 2017-01-03: 20 mg via ORAL

## 2017-01-03 MED ORDER — LIDOCAINE 5 % EX PTCH
1.0000 | MEDICATED_PATCH | Freq: Two times a day (BID) | CUTANEOUS | Status: DC
  Filled 2017-01-03 (×2): qty 1

## 2017-01-03 MED ORDER — ONDANSETRON HCL 4 MG/2ML IJ SOLN
INTRAMUSCULAR | Status: DC | PRN
  Administered 2017-01-03: 4 mg via INTRAVENOUS

## 2017-01-03 MED ORDER — CEFAZOLIN SODIUM-DEXTROSE 2-4 GM/100ML-% IV SOLN
INTRAVENOUS | Status: AC
  Filled 2017-01-03: qty 100

## 2017-01-03 MED ORDER — OXYCODONE-ACETAMINOPHEN 5-325 MG PO TABS
1.0000 | ORAL_TABLET | ORAL | Status: DC | PRN
  Administered 2017-01-03: 2 via ORAL
  Administered 2017-01-03: 1 via ORAL
  Administered 2017-01-04 (×3): 2 via ORAL
  Filled 2017-01-03 (×3): qty 2
  Filled 2017-01-03: qty 1
  Filled 2017-01-03: qty 2

## 2017-01-03 MED ORDER — ONDANSETRON 4 MG PO TBDP: 4.0000 mg | ORAL_TABLET | Freq: Every day | ORAL | Status: DC | PRN

## 2017-01-03 MED ORDER — LIDOCAINE HCL (CARDIAC) 20 MG/ML IV SOLN
INTRAVENOUS | Status: DC | PRN
  Administered 2017-01-03: 100 mg via INTRAVENOUS

## 2017-01-03 MED ORDER — PROMETHAZINE HCL 25 MG/ML IJ SOLN: 6.2500 mg | INTRAMUSCULAR | Status: DC | PRN

## 2017-01-03 MED ORDER — FAMOTIDINE 20 MG PO TABS
ORAL_TABLET | ORAL | Status: AC
  Administered 2017-01-03: 20 mg via ORAL
  Filled 2017-01-03: qty 1

## 2017-01-03 MED ORDER — PROPOFOL 10 MG/ML IV BOLUS
INTRAVENOUS | Status: AC
  Filled 2017-01-03: qty 20

## 2017-01-03 MED ORDER — DOCUSATE SODIUM 100 MG PO CAPS
100.0000 mg | ORAL_CAPSULE | Freq: Two times a day (BID) | ORAL | Status: DC
  Administered 2017-01-03 – 2017-01-04 (×2): 100 mg via ORAL
  Filled 2017-01-03 (×2): qty 1

## 2017-01-03 MED ORDER — HEPARIN SODIUM (PORCINE) 5000 UNIT/ML IJ SOLN
5000.0000 [IU] | Freq: Three times a day (TID) | INTRAMUSCULAR | Status: DC
  Administered 2017-01-04: 5000 [IU] via SUBCUTANEOUS
  Filled 2017-01-03: qty 1

## 2017-01-03 MED ORDER — FERROUS SULFATE 325 (65 FE) MG PO TABS
650.0000 mg | ORAL_TABLET | Freq: Every day | ORAL | Status: DC
  Administered 2017-01-03 – 2017-01-04 (×2): 650 mg via ORAL
  Filled 2017-01-03 (×2): qty 2

## 2017-01-03 MED ORDER — DEXAMETHASONE SODIUM PHOSPHATE 10 MG/ML IJ SOLN
INTRAMUSCULAR | Status: DC | PRN
  Administered 2017-01-03: 10 mg via INTRAVENOUS

## 2017-01-03 MED ORDER — DEXTROSE 5 % IV SOLN
180.0000 mg | Freq: Once | INTRAVENOUS | Status: AC
  Administered 2017-01-03: 180 mg via INTRAVENOUS
  Filled 2017-01-03: qty 4.5

## 2017-01-03 MED ORDER — MIDAZOLAM HCL 2 MG/2ML IJ SOLN
INTRAMUSCULAR | Status: AC
  Filled 2017-01-03: qty 2

## 2017-01-03 MED ORDER — FENTANYL CITRATE (PF) 100 MCG/2ML IJ SOLN
25.0000 ug | INTRAMUSCULAR | Status: DC | PRN
Start: 2017-01-03 — End: 2017-01-03

## 2017-01-03 MED ORDER — ONDANSETRON HCL 4 MG/2ML IJ SOLN: 4.0000 mg | INTRAMUSCULAR | Status: DC | PRN

## 2017-01-03 MED ORDER — MIDAZOLAM HCL 2 MG/2ML IJ SOLN
INTRAMUSCULAR | Status: DC | PRN
  Administered 2017-01-03: 2 mg via INTRAVENOUS

## 2017-01-03 MED ORDER — DEXAMETHASONE SODIUM PHOSPHATE 10 MG/ML IJ SOLN
INTRAMUSCULAR | Status: AC
  Filled 2017-01-03: qty 1

## 2017-01-03 SURGICAL SUPPLY — 31 items
BACTOSHIELD CHG 4% 4OZ (MISCELLANEOUS) ×2
BASKET ZERO TIP 1.9FR (BASKET) IMPLANT
BSKT STON RTRVL ZERO TP 1.9FR (BASKET)
CATH URETL 5X70 OPEN END (CATHETERS) ×4 IMPLANT
CNTNR SPEC 2.5X3XGRAD LEK (MISCELLANEOUS) ×2
CONT SPEC 4OZ STER OR WHT (MISCELLANEOUS) ×2
CONT SPEC 4OZ STRL OR WHT (MISCELLANEOUS) ×2
CONTAINER SPEC 2.5X3XGRAD LEK (MISCELLANEOUS) ×2 IMPLANT
FIBER LASER LITHO 273 (Laser) IMPLANT
GLOVE BIO SURGEON STRL SZ7 (GLOVE) ×4 IMPLANT
GLOVE BIO SURGEON STRL SZ7.5 (GLOVE) ×4 IMPLANT
GOWN STRL REUS W/ TWL LRG LVL4 (GOWN DISPOSABLE) ×2 IMPLANT
GOWN STRL REUS W/TWL LRG LVL4 (GOWN DISPOSABLE) ×4
GOWN STRL REUS W/TWL XL LVL3 (GOWN DISPOSABLE) ×4 IMPLANT
GUIDEWIRE SUPER STIFF (WIRE) IMPLANT
INTRODUCER DILATOR DOUBLE (INTRODUCER) ×1 IMPLANT
KIT RM TURNOVER CYSTO AR (KITS) ×4 IMPLANT
PACK CYSTO AR (MISCELLANEOUS) ×4 IMPLANT
SCRUB CHG 4% DYNA-HEX 4OZ (MISCELLANEOUS) ×2 IMPLANT
SENSORWIRE 0.038 NOT ANGLED (WIRE) ×4
SET CYSTO W/LG BORE CLAMP LF (SET/KITS/TRAYS/PACK) ×4 IMPLANT
SHEATH URETERAL 13/15X36 1L (SHEATH) IMPLANT
SOL .9 NS 3000ML IRR  AL (IV SOLUTION) ×2
SOL .9 NS 3000ML IRR AL (IV SOLUTION) ×2
SOL .9 NS 3000ML IRR UROMATIC (IV SOLUTION) ×2 IMPLANT
STENT URET 6FRX24 CONTOUR (STENTS) ×3 IMPLANT
STENT URET 6FRX26 CONTOUR (STENTS) IMPLANT
SURGILUBE 2OZ TUBE FLIPTOP (MISCELLANEOUS) ×4 IMPLANT
SYRINGE IRR TOOMEY STRL 70CC (SYRINGE) ×4 IMPLANT
WATER STERILE IRR 1000ML POUR (IV SOLUTION) ×4 IMPLANT
WIRE SENSOR 0.038 NOT ANGLED (WIRE) ×2 IMPLANT

## 2017-01-03 NOTE — Progress Notes (Signed)
Pharmacy Antibiotic Note  Mariah AlbertsJoy A Reeves is a 51 y.o. female admitted on 01/03/2017 with ureteral stone s/p cystoscopy and stenting.  Pharmacy has been consulted for gentamicin dosing for perioperative prophylaxis. Patient received a one-time dose of 120 mg.   Plan: Adjusted body weight ~ 60 kg  Will give an additional dose of gentamicin 180 mg for a total of 300 mg (5 mg/kg) and f/u plans for abx.   Height: 5' (152.4 cm) Weight: 190 lb (86.2 kg) IBW/kg (Calculated) : 45.5  Temp (24hrs), Avg:98.9 F (37.2 C), Min:97.8 F (36.6 C), Max:100.4 F (38 C)  No results for input(s): WBC, CREATININE, LATICACIDVEN, VANCOTROUGH, VANCOPEAK, VANCORANDOM, GENTTROUGH, GENTPEAK, GENTRANDOM, TOBRATROUGH, TOBRAPEAK, TOBRARND, AMIKACINPEAK, AMIKACINTROU, AMIKACIN in the last 168 hours.  CrCl cannot be calculated (Patient's most recent lab result is older than the maximum 21 days allowed.).    Allergies  Allergen Reactions  . Contrast Media [Iodinated Diagnostic Agents] Hives  . Tape Hives    Adhesive Tape    Thank you for allowing pharmacy to be a part of this patient's care.  Luisa HartChristy, Sanye Ledesma D 01/03/2017 4:08 PM

## 2017-01-03 NOTE — Op Note (Signed)
Date of procedure: 01/03/17  Preoperative diagnosis:  1. Left ureteral stone 2. Fever 3. Tachycardia   Postoperative diagnosis:  1. Same   Procedure: 1. Cystoscopy 2. Left retrograde pyelogram with interpretation 3. Left ureteral stent placement 6 French by 24 cm  Surgeon: Hadley Pen, MD  Anesthesia: General  Complications: None  Intraoperative findings: The patient had obstruction of the distal ureter the made navigating a sensor wire passed a difficult. However, this was eventually accomplished. Local pressure retrograde pyelogram on the left side was used to identify the collecting system. A left ureteral stent was successfully placed.  EBL:  None  Specimens:  Urine for culture after stent placement  Drains:  6 French by 24 cm left double-J ureteral stent  Disposition: Stable to the postanesthesia care unit  Indication for procedure: The patient is a 51 y.o. female with  history of distal left ureteral calculus and nonobstructing left mid pole calculus to originally failed lithotripsy for her left ureteral stone who presented originally today for left ureteroscopy. In the preoperative area, she was found to have a fever and was tachycardic. A decision was made at that point to only place a left ureteral stent as the patient showed signs of having now an infected stone.  After reviewing the management options for treatment, the patient elected to proceed with the above surgical procedure(s). We have discussed the potential benefits and risks of the procedure, side effects of the proposed treatment, the likelihood of the patient achieving the goals of the procedure, and any potential problems that might occur during the procedure or recuperation. Informed consent has been obtained.  Description of procedure: The patient was met in the preoperative area. All risks, benefits, and indications of the procedure were described in great detail. The patient consented to the procedure.  Preoperative antibiotics were given. The patient was taken to the operative theater. General anesthesia was induced per the anesthesia service. The patient was then placed in the dorsal lithotomy position and prepped and draped in the usual sterile fashion. A preoperative timeout was called.   The 21 French 30 cystoscope was inserted to the patient's bladder per urethra atraumatically. The left ear orifice was visualized and with the aid of a single lumen open-ended catheter a sensor wire was eventually able to place past the distal left ureteral stone with some difficulty. The open-ended catheter was then advanced past the stone and the sensor wire was removed. There was no significant hydronephrotic drip. At this point, low-pressure left retrograde pyelogram was obtained which was used only to identify the collecting system. The sensor wire was then exchanged the open-ended catheter and advanced to level of the left renal pelvis under fluoroscopy. The open-ended catheter was removed. A 6 French by 24 cm double-J ureter stent was then placed. A sensor wire was removed. A curl seen in the patient's left renal pelvis under fluoroscopy and in the urinary bladder under direct visualization. There is clear yellow urine draining from her kidney at this time. This urine was sent for culture. The patient's bladder was then drained. His wound from anesthesia and transferred stable condition to postanesthesia care unit.  Plan:  The patient will be admitted to the floor overnight to monitor for signs of sepsis. If she remains well with stable vital signs overnight, we will discharge her home on at least a 1 week of antibiotics. Will follow-up her urine cultures. We'll plan for definitive stone management in a few weeks once negative cultures have been obtained.  Baruch Gouty, M.D.

## 2017-01-03 NOTE — Anesthesia Post-op Follow-up Note (Cosign Needed)
Anesthesia QCDR form completed.        

## 2017-01-03 NOTE — Anesthesia Procedure Notes (Signed)
Procedure Name: LMA Insertion Date/Time: 01/03/2017 12:52 PM Performed by: Michaele OfferSAVAGE, Saqib Cazarez Pre-anesthesia Checklist: Patient identified, Emergency Drugs available, Suction available, Patient being monitored and Timeout performed Patient Re-evaluated:Patient Re-evaluated prior to induction Oxygen Delivery Method: Circle system utilized Preoxygenation: Pre-oxygenation with 100% oxygen Induction Type: IV induction Ventilation: Mask ventilation without difficulty LMA: LMA inserted LMA Size: 4.0 Number of attempts: 1 Placement Confirmation: positive ETCO2 and breath sounds checked- equal and bilateral Tube secured with: Tape Dental Injury: Teeth and Oropharynx as per pre-operative assessment

## 2017-01-03 NOTE — H&P (View-Only) (Signed)
12/21/2016 3:07 PM   Mariah Reeves [birth year] [id number]  Referring provider: Dortha KernBliss, Laura K, MD [address] [place], [place] [address]  Chief Complaint  Patient presents with  . Follow-up    2 week  kidney stone    HPI: The patient is a 51 year old female who presents today for follow up of 6 mm mid to distal left ureteral stone. She also has a nonobstructing 3 mm stone in the middle left pole. She underwent lithotripsy in 12/07/2016 for the left ureteral calculus however due to the patient's inability to tolerate the procedure, she only received approximately 20% of the standard energy level. On KUB today, her left ureteral stone is in similar position as it was on the day of treatment in the distal ureter.   PMH: Past Medical History:  Diagnosis Date  . Allergy   . Anomaly, uterus    tumor of uterus  . Bilateral occipital neuralgia   . Bipolar 1 disorder (HCC)   . Chronic back pain   . Degenerative disc disease, lumbar   . Depression   . Facet syndrome, lumbar (HCC)   . Kidney stone   . Patella fracture     Surgical History: Past Surgical History:  Procedure Laterality Date  . ABDOMINOPLASTY  2006  . CARPAL TUNNEL RELEASE Left   . CHOLECYSTECTOMY    . DILATATION & CURETTAGE/HYSTEROSCOPY WITH MYOSURE N/A 07/08/2015   Procedure: DILATATION & CURETTAGE/HYSTEROSCOPY WITH MYOSURE;  Surgeon: Nadara Mustardobert P Harris, MD;  Location: [facility];  Service: Gynecology;  Laterality: N/A;  . DILATION AND CURETTAGE OF UTERUS     1/17  . EXTRACORPOREAL SHOCK WAVE LITHOTRIPSY Left 12/07/2016   Procedure: EXTRACORPOREAL SHOCK WAVE LITHOTRIPSY (ESWL);  Surgeon: Hildred LaserBudzyn, Doxie Augenstein James, MD;  Location: [facility];  Service: Urology;  Laterality: Left;  Marland Kitchen. GASTRIC BYPASS    . KNEE SURGERY    . LITHOTRIPSY    . MIDDLE EAR SURGERY    . NASAL SEPTUM SURGERY    . URETEROSCOPY WITH HOLMIUM LASER LITHOTRIPSY     perforated ureter. had nephrostomy tube for brief time    Home Medications:  Allergies as  of 12/21/2016      Reactions   Contrast Media [iodinated Diagnostic Agents] Hives   Tape Hives      Medication List       Accurate as of 12/21/16  3:07 PM. Always use your most recent med list.          diazepam 5 MG tablet Commonly known as:  VALIUM Take 1 tablet (5 mg total) by mouth every 8 (eight) hours as needed for muscle spasms.   escitalopram 20 MG tablet Commonly known as:  LEXAPRO Take 20 mg by mouth at bedtime. Reported on 07/08/2015   ferrous sulfate 325 (65 FE) MG tablet Take 650 mg by mouth daily.   gabapentin 600 MG tablet Commonly known as:  NEURONTIN Take 600 mg by mouth as needed.   HYDROcodone-acetaminophen 10-325 MG tablet Commonly known as:  NORCO Limit one tablet by mouth 3-5 times per day if tolerated   lamoTRIgine 200 MG tablet Commonly known as:  LAMICTAL Take 400 mg by mouth at bedtime. Reported on 11/18/2015   lidocaine 5 % Commonly known as:  LIDODERM Place 1 patch onto the skin every 12 (twelve) hours. Remove & Discard patch within 12 hours or as directed by MD   meclizine 25 MG tablet Commonly known as:  ANTIVERT Take 1 tablet (25 mg total) by mouth  3 (three) times daily as needed for dizziness or nausea.   MULTIVITAMIN PO Take 1 tablet by mouth daily.   ondansetron 4 MG disintegrating tablet Commonly known as:  ZOFRAN-ODT Take 4 mg by mouth 3 (three) times daily as needed for nausea or vomiting. Reported on 11/18/2015   tamsulosin 0.4 MG Caps capsule Commonly known as:  FLOMAX Take 1 capsule (0.4 mg total) by mouth daily.   traZODone 100 MG tablet Commonly known as:  DESYREL Take 100 mg by mouth at bedtime as needed.       Allergies:  Allergies  Allergen Reactions  . Contrast Media [Iodinated Diagnostic Agents] Hives  . Tape Hives    Family History: Family History  Problem Relation Age of Onset  . Hypertension Mother   . Diabetes Mother   . Heart disease Father   . Alcohol abuse Father   . Breast cancer Maternal  Grandmother   . Kidney cancer Neg Hx   . Bladder Cancer Neg Hx     Social History:  reports that she has never smoked. She has never used smokeless tobacco. She reports that she does not drink alcohol or use drugs.  ROS: UROLOGY Frequent Urination?: No Hard to postpone urination?: No Burning/pain with urination?: No Get up at night to urinate?: No Leakage of urine?: No Urine stream starts and stops?: No Trouble starting stream?: No Do you have to strain to urinate?: No Blood in urine?: No Urinary tract infection?: No Sexually transmitted disease?: No Injury to kidneys or bladder?: No Painful intercourse?: No Weak stream?: No Currently pregnant?: No Vaginal bleeding?: No Last menstrual period?: 11/2016  Gastrointestinal Nausea?: Yes Vomiting?: No Indigestion/heartburn?: No Diarrhea?: Yes Constipation?: No  Constitutional Fever: No Night sweats?: No Weight loss?: No Fatigue?: No  Skin Skin rash/lesions?: No Itching?: No  Eyes Blurred vision?: No Double vision?: No  Ears/Nose/Throat Sore throat?: No Sinus problems?: No  Hematologic/Lymphatic Swollen glands?: No Easy bruising?: No  Cardiovascular Leg swelling?: No Chest pain?: No  Respiratory Cough?: No Shortness of breath?: No  Endocrine Excessive thirst?: No  Musculoskeletal Back pain?: Yes Joint pain?: No  Neurological Headaches?: No Dizziness?: No  Psychologic Depression?: No Anxiety?: No  Physical Exam: BP 106/75   Pulse (!) 142   Ht 5' (1.524 m)   Wt 190 lb 6.4 oz (86.4 kg)   LMP 11/24/2016   BMI 37.18 kg/m   Constitutional:  Alert and oriented, No acute distress. HEENT: [place] AT, moist mucus membranes.  Trachea midline, no masses. Cardiovascular: No clubbing, cyanosis, or edema. Respiratory: Normal respiratory effort, no increased work of breathing. GI: Abdomen is soft, nontender, nondistended, no abdominal masses GU: No CVA tenderness.  Skin: No rashes, bruises or  suspicious lesions. Lymph: No cervical or inguinal adenopathy. Neurologic: Grossly intact, no focal deficits, moving all 4 extremities. Psychiatric: Normal mood and affect.  Laboratory Data: Lab Results  Component Value Date   WBC 5.4 10/17/2016   HGB 11.9 (L) 10/17/2016   HCT 35.3 10/17/2016   MCV 84.8 10/17/2016   PLT 276 10/17/2016    Lab Results  Component Value Date   CREATININE 0.66 10/17/2016    No results found for: PSA  No results found for: TESTOSTERONE  No results found for: HGBA1C  Urinalysis    Component Value Date/Time   COLORURINE Yellow 05/19/2014 1628   APPEARANCEUR Clear 05/19/2014 1628   LABSPEC 1.021 05/19/2014 1628   PHURINE 5.0 05/19/2014 1628   GLUCOSEU Negative 05/19/2014 1628   HGBUR 1+ 05/19/2014  1628   BILIRUBINUR Negative 05/19/2014 1628   KETONESUR Negative 05/19/2014 1628   PROTEINUR Negative 05/19/2014 1628   NITRITE Negative 05/19/2014 1628   LEUKOCYTESUR Negative 05/19/2014 1628    Pertinent Imaging: KUB reviewed as above.  Assessment & Plan:    1. Left distal ureteral stone - 6 mm 2. Left nonobstructing midpole stone - 3 mm I discussed the patient that her stone is still present in the distal left ureter. We did discuss options which include continued metabolic expulsive therapy, ureteroscopy, and lithotripsy. I recommended against lithotripsy as she did not tolerate this well previously for this stone. She elected to proceed with cystoscopy, left ureteroscopy, laser lithotripsy, left stent. She does understand this procedure goes well that we may try to address a 3 mm stone in her left mid pole. She is very nervous about having a stent and would strongly prefer being able to remove the stent herself via string. Discussed the risks, benefits, indications of this procedure. She is agreeable to proceeding.    Hildred LaserBrian James Rande Roylance, MD  [facility] [address], [address] [place], [place] [address] [phone]  [phone]

## 2017-01-03 NOTE — Interval H&P Note (Signed)
History and Physical Interval Note:  01/03/2017 12:24 PM  Mariah Reeves  has presented today for surgery, with the diagnosis of LEFT URETERAL STONE  The various methods of treatment have been discussed with the patient and family. After consideration of risks, benefits and other options for treatment, the patient has consented to  Procedure(s): URETEROSCOPY WITH HOLMIUM LASER LITHOTRIPSY (Left) CYSTOSCOPY WITH STENT PLACEMENT (Left) as a surgical intervention .  The patient's history has been reviewed, patient examined, no change in status, stable for surgery.  I have reviewed the patient's chart and labs.  Questions were answered to the patient's satisfaction.    RRR Lungs clear  Hildred LaserBrian James Porschia [place]

## 2017-01-03 NOTE — Progress Notes (Signed)
Patient noted to have temperature of 38 degrees Celsius and tachycardia. Will plan only for cysto, left ureteral stent placement at this time due to concern for possible underlying infection.

## 2017-01-03 NOTE — Transfer of Care (Signed)
Immediate Anesthesia Transfer of Care Note  Patient: Mariah AlbertsJoy A Reeves  Procedure(s) Performed: Procedure(s): CYSTOSCOPY WITH RETROGRADE PYELOGRAM/URETERAL STENT PLACEMENT  Patient Location: PACU  Anesthesia Type:General  Level of Consciousness: awake, oriented and patient cooperative  Airway & Oxygen Therapy: Patient Spontanous Breathing and Patient connected to face mask oxygen  Post-op Assessment: Report given to RN, Post -op Vital signs reviewed and stable and Patient moving all extremities X 4  Post vital signs: Reviewed and stable  Last Vitals:  Vitals:   01/03/17 1215 01/03/17 1330  BP: (!) 154/95   Pulse: (!) 124   Resp: 20   Temp: (!) 38 C (P) 36.6 C    Last Pain:  Vitals:   01/03/17 1215  TempSrc: Oral      Patients Stated Pain Goal: 0 (01/03/17 1215)  Complications: No apparent anesthesia complications

## 2017-01-03 NOTE — Anesthesia Preprocedure Evaluation (Signed)
Anesthesia Evaluation  Patient identified by MRN, date of birth, ID band Patient awake    Reviewed: Allergy & Precautions, H&P , NPO status , Patient's Chart, lab work & pertinent test results  History of Anesthesia Complications (+) PROLONGED EMERGENCE and history of anesthetic complications  Airway Mallampati: III  TM Distance: >3 FB Neck ROM: full    Dental  (+) Poor Dentition, Chipped   Pulmonary neg shortness of breath, sleep apnea ,           Cardiovascular Exercise Tolerance: Good (-) angina(-) Past MI and (-) DOE negative cardio ROS       Neuro/Psych PSYCHIATRIC DISORDERS negative neurological ROS     GI/Hepatic negative GI ROS, Neg liver ROS, neg GERD  ,  Endo/Other  negative endocrine ROS  Renal/GU negative Renal ROS  negative genitourinary   Musculoskeletal  (+) Arthritis ,   Abdominal   Peds  Hematology negative hematology ROS (+)   Anesthesia Other Findings Past Medical History:   Depression                                                   Bipolar 1 disorder (HCC)                                     Allergy                                                      Anomaly, uterus                                                Comment:tumor of uterus  Past Surgical History:   GASTRIC BYPASS                                                KNEE SURGERY                                                  CHOLECYSTECTOMY                                               CARPAL TUNNEL RELEASE                           Left              NASAL SEPTUM SURGERY  MIDDLE EAR SURGERY                                           Patient reports multiple bruises from falling down the stairs   Reproductive/Obstetrics negative OB ROS                             Anesthesia Physical  Anesthesia Plan  ASA: III  Anesthesia Plan: General   Post-op Pain  Management:    Induction: Intravenous  PONV Risk Score and Plan: 3 and Dexamethasone, Propofol, Ondansetron and Midazolam  Airway Management Planned:   Additional Equipment:   Intra-op Plan:   Post-operative Plan: Extubation in OR  Informed Consent: I have reviewed the patients History and Physical, chart, labs and discussed the procedure including the risks, benefits and alternatives for the proposed anesthesia with the patient or authorized representative who has indicated his/her understanding and acceptance.   Dental Advisory Given  Plan Discussed with: Anesthesiologist, CRNA and Surgeon  Anesthesia Plan Comments:         Anesthesia Quick Evaluation

## 2017-01-04 ENCOUNTER — Encounter: Payer: Self-pay | Admitting: Urology

## 2017-01-04 ENCOUNTER — Other Ambulatory Visit: Payer: Self-pay

## 2017-01-04 DIAGNOSIS — N201 Calculus of ureter: Secondary | ICD-10-CM | POA: Diagnosis not present

## 2017-01-04 LAB — CBC
HCT: 34.8 % — ABNORMAL LOW (ref 35.0–47.0)
Hemoglobin: 11.8 g/dL — ABNORMAL LOW (ref 12.0–16.0)
MCH: 28.9 pg (ref 26.0–34.0)
MCHC: 33.9 g/dL (ref 32.0–36.0)
MCV: 85.5 fL (ref 80.0–100.0)
PLATELETS: 244 10*3/uL (ref 150–440)
RBC: 4.07 MIL/uL (ref 3.80–5.20)
RDW: 14.5 % (ref 11.5–14.5)
WBC: 6.5 10*3/uL (ref 3.6–11.0)

## 2017-01-04 LAB — BASIC METABOLIC PANEL
Anion gap: 5 (ref 5–15)
BUN: 18 mg/dL (ref 6–20)
CALCIUM: 8.7 mg/dL — AB (ref 8.9–10.3)
CO2: 25 mmol/L (ref 22–32)
Chloride: 108 mmol/L (ref 101–111)
Creatinine, Ser: 0.76 mg/dL (ref 0.44–1.00)
GFR calc Af Amer: >60 mL/min (ref >60)
GLUCOSE: 150 mg/dL — AB (ref 65–99)
Potassium: 4.4 mmol/L (ref 3.5–5.1)
Sodium: 138 mmol/L (ref 135–145)

## 2017-01-04 MED ORDER — OXYCODONE-ACETAMINOPHEN 5-325 MG PO TABS: 1.0000 | ORAL_TABLET | ORAL | 0 refills | Status: DC | PRN

## 2017-01-04 MED ORDER — CIPROFLOXACIN HCL 500 MG PO TABS: 500.0000 mg | ORAL_TABLET | Freq: Two times a day (BID) | ORAL | 0 refills | Status: DC

## 2017-01-04 NOTE — Anesthesia Postprocedure Evaluation (Signed)
Anesthesia Post Note  Patient: Mariah Reeves  Procedure(s) Performed: Procedure(s): CYSTOSCOPY WITH RETROGRADE PYELOGRAM/URETERAL STENT PLACEMENT  Patient location during evaluation: PACU Anesthesia Type: General Level of consciousness: awake and alert Pain management: pain level controlled Vital Signs Assessment: post-procedure vital signs reviewed and stable Respiratory status: spontaneous breathing, nonlabored ventilation, respiratory function stable and patient connected to nasal cannula oxygen Cardiovascular status: blood pressure returned to baseline and stable Postop Assessment: no signs of nausea or vomiting Anesthetic complications: no     Last Vitals:  Vitals:   01/04/17 0448 01/04/17 1228  BP: (!) 107/56 (!) 97/55  Pulse: 64 67  Resp: 18   Temp: 36.5 C 36.7 C    Last Pain:  Vitals:   01/04/17 1228  TempSrc: Oral  PainSc:                  Lenard SimmerAndrew Tyjae Issa

## 2017-01-04 NOTE — Progress Notes (Signed)
Pt to be discharged per MD order. IV removed. Instructions reviewed with pt all questions answered. Scripts given to pt. Will discharge in wheelchair.

## 2017-01-04 NOTE — Discharge Summary (Signed)
Date of admission: 01/03/2017  Date of discharge: 01/04/2017  Admission diagnosis: Left ureteral stone, fever  Discharge diagnosis: Same  Secondary diagnoses:  Patient Active Problem List   Diagnosis Date Noted  . Left ureteral stone 01/03/2017  . Endometrial disorder 07/08/2015  . Bilateral occipital neuralgia 03/02/2015  . DDD (degenerative disc disease), lumbar 12/24/2014  . Facet syndrome, lumbar (HCC) 12/24/2014  . Sacroiliac joint dysfunction 12/24/2014    History and Physical: For full details, please see admission history and physical. Briefly, Mariah Reeves is a 51 y.o. year old patient with left ureteral stone who presented for ureteroscopy. Found to have fever and tachycardia in preop, so a left ureteral stent only was placed due to concern for infection. Admitted overnight to monitor clinical condition   Hospital Course: Patient tolerated the procedure well.  She was then transferred to the floor after an uneventful PACU stay.  Her hospital course was uncomplicated.  On POD#1 she had met discharge criteria: was eating a regular diet, was up and ambulating independently,  pain was well controlled, was voiding without a catheter, and was ready to for discharge.  NAD Soft NT ND No foley  Laboratory values:   Recent Labs  01/04/17 0441  WBC 6.5  HGB 11.8*  HCT 34.8*    Recent Labs  01/04/17 0441  NA 138  K 4.4  CL 108  CO2 25  GLUCOSE 150*  BUN 18  CREATININE 0.76  CALCIUM 8.7*   No results for input(s): LABPT, INR in the last 72 hours. No results for input(s): LABURIN in the last 72 hours. Results for orders placed or performed in visit on 12/21/16  Urine culture     Status: None   Collection Time: 12/21/16  3:26 PM  Result Value Ref Range Status   Urine Culture, Routine Final report  Final   Organism ID, Bacteria Comment  Final    Comment: Mixed urogenital flora 50,000-100,000 colony forming units per mL     Disposition: Home  Discharge  instruction: The patient was instructed to be ambulatory but told to refrain from heavy lifting, strenuous activity, or driving.   Discharge medications: Allergies as of 01/04/2017      Reactions   Contrast Media [iodinated Diagnostic Agents] Hives   Tape Hives   Adhesive Tape      Medication List    STOP taking these medications   HYDROcodone-acetaminophen 10-325 MG tablet Commonly known as:  NORCO     TAKE these medications   acetaminophen 500 MG tablet Commonly known as:  TYLENOL Take 1,000 mg by mouth 2 (two) times daily as needed for headache.   ciprofloxacin 500 MG tablet Commonly known as:  CIPRO Take 1 tablet (500 mg total) by mouth 2 (two) times daily.   diazepam 5 MG tablet Commonly known as:  VALIUM Take 1 tablet (5 mg total) by mouth every 8 (eight) hours as needed for muscle spasms.   escitalopram 20 MG tablet Commonly known as:  LEXAPRO Take 20 mg by mouth at bedtime. Reported on 07/08/2015   ferrous sulfate 325 (65 FE) MG tablet Take 650 mg by mouth daily.   gabapentin 600 MG tablet Commonly known as:  NEURONTIN Take 600 mg by mouth daily.   lamoTRIgine 200 MG tablet Commonly known as:  LAMICTAL Take 400 mg by mouth at bedtime. Reported on 11/18/2015   lidocaine 5 % Commonly known as:  LIDODERM Place 1 patch onto the skin every 12 (twelve) hours. Remove & Discard   patch within 12 hours or as directed by MD   meclizine 25 MG tablet Commonly known as:  ANTIVERT Take 1 tablet (25 mg total) by mouth 3 (three) times daily as needed for dizziness or nausea.   MULTIVITAMIN PO Take 1 tablet by mouth daily.   ondansetron 4 MG disintegrating tablet Commonly known as:  ZOFRAN-ODT Take 4 mg by mouth daily as needed for nausea or vomiting. Reported on 11/18/2015   oxyCODONE-acetaminophen 5-325 MG tablet Commonly known as:  ROXICET Take 1 tablet by mouth every 4 (four) hours as needed for severe pain. What changed:  how much to take   tamsulosin 0.4 MG Caps  capsule Commonly known as:  FLOMAX Take 1 capsule (0.4 mg total) by mouth daily.   traZODone 100 MG tablet Commonly known as:  DESYREL Take 100 mg by mouth at bedtime as needed for sleep.       Followup:  Follow-up Information    Nickie Retort, MD Follow up.   Specialty:  Urology Why:  Office to call to schedule repeat surgery Contact information: [address] [place] [address] [phone]

## 2017-01-05 ENCOUNTER — Emergency Department
Admission: EM | Admit: 2017-01-05 | Discharge: 2017-01-05 | Disposition: A | Discharge status: Home / Self Care | Payer: BC Managed Care – PPO | Attending: Emergency Medicine | Admitting: Emergency Medicine

## 2017-01-05 ENCOUNTER — Encounter: Payer: Self-pay | Admitting: Emergency Medicine

## 2017-01-05 ENCOUNTER — Emergency Department: Payer: BC Managed Care – PPO

## 2017-01-05 ENCOUNTER — Telehealth: Payer: Self-pay | Admitting: Radiology

## 2017-01-05 ENCOUNTER — Other Ambulatory Visit: Payer: Self-pay | Admitting: Radiology

## 2017-01-05 DIAGNOSIS — Z79899 Other long term (current) drug therapy: Secondary | ICD-10-CM | POA: Insufficient documentation

## 2017-01-05 DIAGNOSIS — G8929 Other chronic pain: Secondary | ICD-10-CM | POA: Diagnosis not present

## 2017-01-05 DIAGNOSIS — R0789 Other chest pain: Secondary | ICD-10-CM

## 2017-01-05 DIAGNOSIS — N201 Calculus of ureter: Secondary | ICD-10-CM

## 2017-01-05 DIAGNOSIS — R079 Chest pain, unspecified: Secondary | ICD-10-CM | POA: Diagnosis present

## 2017-01-05 LAB — CBC
HEMATOCRIT: 34.8 % — AB (ref 35.0–47.0)
Hemoglobin: 11.7 g/dL — ABNORMAL LOW (ref 12.0–16.0)
MCH: 29.3 pg (ref 26.0–34.0)
MCHC: 33.7 g/dL (ref 32.0–36.0)
MCV: 87 fL (ref 80.0–100.0)
Platelets: 271 10*3/uL (ref 150–440)
RBC: 4 MIL/uL (ref 3.80–5.20)
RDW: 14.4 % (ref 11.5–14.5)
WBC: 8.1 10*3/uL (ref 3.6–11.0)

## 2017-01-05 LAB — BASIC METABOLIC PANEL
Anion gap: 7 (ref 5–15)
BUN: 19 mg/dL (ref 6–20)
CHLORIDE: 107 mmol/L (ref 101–111)
CO2: 27 mmol/L (ref 22–32)
Calcium: 8.6 mg/dL — ABNORMAL LOW (ref 8.9–10.3)
Creatinine, Ser: 0.89 mg/dL (ref 0.44–1.00)
GFR calc Af Amer: >60 mL/min (ref >60)
GFR calc non Af Amer: >60 mL/min (ref >60)
GLUCOSE: 101 mg/dL — AB (ref 65–99)
POTASSIUM: 3.6 mmol/L (ref 3.5–5.1)
Sodium: 141 mmol/L (ref 135–145)

## 2017-01-05 LAB — URINE CULTURE: Culture: NO GROWTH

## 2017-01-05 LAB — TROPONIN I: Troponin I: <0.03 ng/mL (ref <0.03)

## 2017-01-05 NOTE — ED Provider Notes (Signed)
[facility] Emergency Department Provider Note  ____________________________________________  Time seen: Approximately 4:13 PM  I have reviewed the triage vital signs and the nursing notes.   HISTORY  Chief Complaint Chest Pain    HPI Mariah Reeves is a 51 y.o. female who complains of gradual onset of bilateral upper anterior chest pain radiating to her shoulder blades bilaterally started about 9:00 this morning. Initially was about 9 out of 10, and by 1:00 when she came to the ED was about 8 out of 10. It is now 6 out of 10. No shortness of breath diaphoresis or vomiting. No dizziness. Worse with movement, not exertional nor pleuritic. She recently had urological procedure for stenting of the left ureter due to obstructing stone. Denies any fever or chills since then. Urine culture done at that time is negative     Past Medical History:  Diagnosis Date  . Allergy   . Anomaly, uterus    tumor of uterus  . Anxiety   . Bilateral occipital neuralgia   . Bipolar 1 disorder (HCC)   . Chronic back pain   . Degenerative disc disease, lumbar   . Depression   . Facet syndrome, lumbar (HCC)   . Headache    Migraines  . History of kidney stones    Left  . Hx of vertigo   . Patella fracture    Left  . Sleep apnea    C-PAP     Patient Active Problem List   Diagnosis Date Noted  . Left ureteral stone 01/03/2017  . Endometrial disorder 07/08/2015  . Bilateral occipital neuralgia 03/02/2015  . DDD (degenerative disc disease), lumbar 12/24/2014  . Facet syndrome, lumbar (HCC) 12/24/2014  . Sacroiliac joint dysfunction 12/24/2014     Past Surgical History:  Procedure Laterality Date  . ABDOMINOPLASTY  2006  . CARPAL TUNNEL RELEASE Left   . CHOLECYSTECTOMY    . CYSTOSCOPY W/ URETERAL STENT PLACEMENT  01/03/2017   Procedure: CYSTOSCOPY WITH RETROGRADE PYELOGRAM/URETERAL STENT PLACEMENT;  Surgeon: Hildred LaserBudzyn, Brian James, MD;  Location: [facility] ORS;  Service:  Urology;;  . DILATATION & CURETTAGE/HYSTEROSCOPY WITH MYOSURE N/A 07/08/2015   Procedure: DILATATION & CURETTAGE/HYSTEROSCOPY WITH MYOSURE;  Surgeon: Nadara Mustardobert P Harris, MD;  Location: [facility] ORS;  Service: Gynecology;  Laterality: N/A;  . DILATION AND CURETTAGE OF UTERUS     1/17  . EXTRACORPOREAL SHOCK WAVE LITHOTRIPSY Left 12/07/2016   Procedure: EXTRACORPOREAL SHOCK WAVE LITHOTRIPSY (ESWL);  Surgeon: Hildred LaserBudzyn, Brian James, MD;  Location: [facility] ORS;  Service: Urology;  Laterality: Left;  Marland Kitchen. GASTRIC BYPASS    . KNEE SURGERY    . LITHOTRIPSY    . MIDDLE EAR SURGERY    . NASAL SEPTUM SURGERY    . URETEROSCOPY WITH HOLMIUM LASER LITHOTRIPSY     perforated ureter. had nephrostomy tube for brief time     Prior to Admission medications   Medication Sig Start Date End Date Taking? Authorizing Provider  acetaminophen (TYLENOL) 500 MG tablet Take 1,000 mg by mouth 2 (two) times daily as needed for headache.    [provider]  ciprofloxacin (CIPRO) 500 MG tablet Take 1 tablet (500 mg total) by mouth 2 (two) times daily. 01/04/17   Hildred LaserBudzyn, Brian James, MD  diazepam (VALIUM) 5 MG tablet Take 1 tablet (5 mg total) by mouth every 8 (eight) hours as needed for muscle spasms. Patient not taking: Reported on 05/03/2016 05/14/15   Emily FilbertWilliams, Jonathan E, MD  escitalopram (LEXAPRO) 20 MG tablet Take 20  mg by mouth at bedtime. Reported on 07/08/2015    [provider]  ferrous sulfate 325 (65 FE) MG tablet Take 650 mg by mouth daily.    [provider]  gabapentin (NEURONTIN) 600 MG tablet Take 600 mg by mouth daily.     [provider]  lamoTRIgine (LAMICTAL) 200 MG tablet Take 400 mg by mouth at bedtime. Reported on 11/18/2015    [provider]  lidocaine (LIDODERM) 5 % Place 1 patch onto the skin every 12 (twelve) hours. Remove & Discard patch within 12 hours or as directed by MD Patient not taking: Reported on 12/07/2016 02/26/16 02/25/17  Rebecka ApleyWebster, Allison P, MD  meclizine  (ANTIVERT) 25 MG tablet Take 1 tablet (25 mg total) by mouth 3 (three) times daily as needed for dizziness or nausea. Patient not taking: Reported on 12/01/2016 05/14/15   Emily FilbertWilliams, Jonathan E, MD  Multiple Vitamins-Minerals (MULTIVITAMIN PO) Take 1 tablet by mouth daily.    [provider]  ondansetron (ZOFRAN-ODT) 4 MG disintegrating tablet Take 4 mg by mouth daily as needed for nausea or vomiting. Reported on 11/18/2015    [provider]  oxyCODONE-acetaminophen (ROXICET) 5-325 MG tablet Take 1 tablet by mouth every 4 (four) hours as needed for severe pain. 01/04/17   Hildred LaserBudzyn, Brian James, MD  tamsulosin (FLOMAX) 0.4 MG CAPS capsule Take 1 capsule (0.4 mg total) by mouth daily. 11/21/16   Hildred LaserBudzyn, Brian James, MD  traZODone (DESYREL) 100 MG tablet Take 100 mg by mouth at bedtime as needed for sleep.     [provider]     Allergies Contrast media [iodinated diagnostic agents] and Tape   Family History  Problem Relation Age of Onset  . Hypertension Mother   . Diabetes Mother   . Heart disease Father   . Alcohol abuse Father   . Breast cancer Maternal Grandmother   . Kidney cancer Neg Hx   . Bladder Cancer Neg Hx     Social History Social History  Substance Use Topics  . Smoking status: Never Smoker  . Smokeless tobacco: Never Used  . Alcohol use No    Review of Systems  Constitutional:   No fever or chills.  ENT:   No sore throat. No rhinorrhea. Cardiovascular:   Positive as above chest pain without syncope. Respiratory:   No dyspnea or cough. Gastrointestinal:   Negative for abdominal pain, vomiting and diarrhea.  Musculoskeletal:   Negative for focal pain or swelling All other systems reviewed and are negative except as documented above in ROS and HPI.  ____________________________________________   PHYSICAL EXAM:  VITAL SIGNS: ED Triage Vitals  Enc Vitals Group     BP 01/05/17 1214 133/69     Pulse Rate 01/05/17 1214 71     Resp 01/05/17  1214 16     Temp 01/05/17 1214 98.2 F (36.8 C)     Temp Source 01/05/17 1214 Oral     SpO2 01/05/17 1214 97 %     Weight 01/05/17 1212 190 lb (86.2 kg)     Height 01/05/17 1212 5' (1.524 m)     Head Circumference --      Peak Flow --      Pain Score 01/05/17 1212 8     Pain Loc --      Pain Edu? --      Excl. in GC? --     Vital signs reviewed, nursing assessments reviewed.   Constitutional:   Alert and oriented. Well  appearing and in no distress. Eyes:   No scleral icterus.  EOMI. No nystagmus. No conjunctival pallor. PERRL. ENT   Head:   Normocephalic and atraumatic.   Nose:   No congestion/rhinnorhea.    Mouth/Throat:   MMM, no pharyngeal erythema. No peritonsillar mass.    Neck:   No meningismus. Full ROM Hematological/Lymphatic/Immunilogical:   No cervical lymphadenopathy. Cardiovascular:   RRR. Symmetric bilateral radial and DP pulses.  No murmurs.  Respiratory:   Normal respiratory effort without tachypnea/retractions. Breath sounds are clear and equal bilaterally. No wheezes/rales/rhonchi. Gastrointestinal:   Soft and nontender. Non distended. There is no CVA tenderness.  No rebound, rigidity, or guarding. Genitourinary:   deferred Musculoskeletal:   Normal range of motion in all extremities. No joint effusions.  No lower extremity tenderness.  No edema. Anterior chest wall tender to the touch bilaterally over the upper chest in the area of indicated pain, reproducing her symptoms Neurologic:   Normal speech and language.  Motor grossly intact. No gross focal neurologic deficits are appreciated.  Skin:    Skin is warm, dry and intact. No rash noted.  No petechiae, purpura, or bullae.  ____________________________________________    LABS (pertinent positives/negatives) (all labs ordered are listed, but only abnormal results are displayed) Labs Reviewed  BASIC METABOLIC PANEL - Abnormal; Notable for the following:       Result Value   Glucose, Bld 101  (*)    Calcium 8.6 (*)    All other components within normal limits  CBC - Abnormal; Notable for the following:    Hemoglobin 11.7 (*)    HCT 34.8 (*)    All other components within normal limits  TROPONIN I   ____________________________________________   EKG  Interpreted by me Normal sinus rhythm rate of 75, normal axis and intervals. Normal QRS ST segments and T waves. Voltage criteria for LVH in the high lateral leads.  ____________________________________________    RADIOLOGY  Dg Chest 2 View  Result Date: 01/05/2017 CLINICAL DATA:  51 year old female with a history of chest pain EXAM: CHEST  2 VIEW COMPARISON:  10/17/2016, 08/30/2014 FINDINGS: Cardiomediastinal silhouette within normal limits. No evidence of central vascular congestion. No pneumothorax or pleural effusion. No confluent airspace disease. IMPRESSION: No radiographic evidence of acute cardiopulmonary disease Electronically Signed   By: Gilmer Mor D.O.   On: 01/05/2017 12:59    ____________________________________________   PROCEDURES Procedures  ____________________________________________   INITIAL IMPRESSION / ASSESSMENT AND PLAN / ED COURSE  Pertinent labs & imaging results that were available during my care of the patient were reviewed by me and considered in my medical decision making (see chart for details).  Patient well appearing no acute distress, presents with an episode of chest pain which is gradually improving, muscular skeletal in nature.Considering the patient's symptoms, medical history, and physical examination today, I have low suspicion for ACS, PE, TAD, pneumothorax, carditis, mediastinitis, pneumonia, CHF, or sepsis.  Workup negative, patient is suitable for outpatient follow-up.      ____________________________________________   FINAL CLINICAL IMPRESSION(S) / ED DIAGNOSES  Final diagnoses:  Chest wall pain      New Prescriptions   No medications on file      Portions of this note were generated with dragon dictation software. Dictation errors may occur despite best attempts at proofreading.    Sharman Cheek, MD 01/05/17 [phone]

## 2017-01-05 NOTE — ED Triage Notes (Signed)
Arrives with C/O chest pain, onset of symptoms this morning.  States had a renal stent placed on Wednesday and was admitted overnight for IV antibiotics.  Was intubated for procedure.  Took Oxycodone at 1000, with no relief of chest pain. States pain worse when laying down.

## 2017-01-05 NOTE — Telephone Encounter (Signed)
Pt scheduled for URS with Dr Sherryl BartersBudzyn on 01/17/17. Called to make pt aware of surgery, to follow instructions given at pre-admit testing for previous surgery & to call day prior to surgery for arrival time to SDS. Pt voices understanding. Pt then stated that she is having pain across her chest above her breasts that is constant and worsens when she lies down. States it is not severe, just uncomfortable. Per Dr Sherryl BartersBudzyn, advised pt to call her PCP or go to the emergency room as this is not an expected complication of surgery. Pt states she will call her PCP.

## 2017-01-05 NOTE — Discharge Instructions (Signed)
Results for orders placed or performed during the hospital encounter of 01/05/17  Basic metabolic panel  Result Value Ref Range   Sodium 141 135 - 145 mmol/L   Potassium 3.6 3.5 - 5.1 mmol/L   Chloride 107 101 - 111 mmol/L   CO2 27 22 - 32 mmol/L   Glucose, Bld 101 (H) 65 - 99 mg/dL   BUN 19 6 - 20 mg/dL   Creatinine, Ser 1.610.89 0.44 - 1.00 mg/dL   Calcium 8.6 (L) 8.9 - 10.3 mg/dL   GFR calc non Af Amer >60 >60 mL/min   GFR calc Af Amer >60 >60 mL/min   Anion gap 7 5 - 15  CBC  Result Value Ref Range   WBC 8.1 3.6 - 11.0 K/uL   RBC 4.00 3.80 - 5.20 MIL/uL   Hemoglobin 11.7 (L) 12.0 - 16.0 g/dL   HCT 09.634.8 (L) 04.535.0 - 40.947.0 %   MCV 87.0 80.0 - 100.0 fL   MCH 29.3 26.0 - 34.0 pg   MCHC 33.7 32.0 - 36.0 g/dL   RDW 81.114.4 91.411.5 - 78.214.5 %   Platelets 271 150 - 440 K/uL  Troponin I  Result Value Ref Range   Troponin I <0.03 <0.03 ng/mL   Dg Chest 2 View  Result Date: 01/05/2017 CLINICAL DATA:  51 year old female with a history of chest pain EXAM: CHEST  2 VIEW COMPARISON:  10/17/2016, 08/30/2014 FINDINGS: Cardiomediastinal silhouette within normal limits. No evidence of central vascular congestion. No pneumothorax or pleural effusion. No confluent airspace disease. IMPRESSION: No radiographic evidence of acute cardiopulmonary disease Electronically Signed   By: Gilmer MorJaime  Wagner D.O.   On: 01/05/2017 12:59   Dg Abd 1 View  Result Date: 12/21/2016 CLINICAL DATA:  Recent lithotripsy EXAM: ABDOMEN - 1 VIEW COMPARISON:  December 12, 2016 FINDINGS: 3 mm calcification in the mid left pelvis remains without change. There is a tiny phlebolith in the lower right pelvis. No new calcifications are evident. There is moderate stool throughout the colon. There is no bowel dilatation or air-fluid level to suggest bowel obstruction. No free air. There is postoperative change in the upper abdomen. IMPRESSION: Stable 3 mm calcification mid left pelvis, likely distal ureteral calculus. No new calcifications. Bowel gas  pattern unremarkable. Electronically Signed   By: Bretta BangWilliam  Woodruff III M.D.   On: 12/21/2016 16:20   Abdomen 1 View (kub)  Result Date: 12/12/2016 CLINICAL DATA:  History of left-sided kidney stones, lithotripsy recently EXAM: ABDOMEN - 1 VIEW COMPARISON:  KUB of 12/07/2016 and CT abdomen pelvis of 10/17/2016 FINDINGS: The distal left ureteral calculus is unchanged in position. No definite renal calculi are noted. The bowel gas pattern is nonspecific. IMPRESSION: No change in position of the distal left ureteral calculus. Electronically Signed   By: Dwyane DeePaul  Barry M.D.   On: 12/12/2016 15:53   Dg Abd 1 View  Result Date: 12/07/2016 CLINICAL DATA:  Left ureteral stone.  Lithotripsy. EXAM: ABDOMEN - 1 VIEW COMPARISON:  December 01, 2016 FINDINGS: No definitive renal stones. The distal left ureteral stone is in stable position. No other interval changes. IMPRESSION: The distal left ureteral stone is unchanged in position. Electronically Signed   By: Gerome Samavid  Williams III M.D   On: 12/07/2016 12:21

## 2017-01-08 ENCOUNTER — Telehealth: Payer: Self-pay | Admitting: Urology

## 2017-01-08 NOTE — Telephone Encounter (Signed)
Budzyn admitted pt for overnight stay.  Pt had a stent placed in hospital.  Since she has been home she has been using the bathroom every 30 minutes-60 minutes.  Please give pt a call.  [phone].

## 2017-01-08 NOTE — Telephone Encounter (Signed)
We can give her myrbetriq 25 mg samples for a few weeks. Welcome back!

## 2017-01-08 NOTE — Telephone Encounter (Signed)
Spoke with pt and made aware increased urination can be normal with a stent in place. Pt requested an OAB medication. Please advise.

## 2017-01-09 NOTE — Telephone Encounter (Signed)
Spoke with pt in reference to myrbetriq. Made aware samples are up front. Pt voiced understanding.

## 2017-01-10 ENCOUNTER — Other Ambulatory Visit: Payer: BC Managed Care – PPO

## 2017-01-10 DIAGNOSIS — N201 Calculus of ureter: Secondary | ICD-10-CM

## 2017-01-10 LAB — MICROSCOPIC EXAMINATION: RBC, UA: >30 /hpf — ABNORMAL HIGH (ref 0–?)

## 2017-01-10 LAB — URINALYSIS, COMPLETE
Bilirubin, UA: NEGATIVE
Glucose, UA: NEGATIVE
Ketones, UA: NEGATIVE
NITRITE UA: NEGATIVE
PH UA: 5.5 (ref 5.0–7.5)
Specific Gravity, UA: >1.03 — ABNORMAL HIGH (ref 1.005–1.030)
UUROB: 0.2 mg/dL (ref 0.2–1.0)

## 2017-01-11 ENCOUNTER — Other Ambulatory Visit: Payer: BC Managed Care – PPO

## 2017-01-13 LAB — CULTURE, URINE COMPREHENSIVE

## 2017-01-16 ENCOUNTER — Telehealth: Payer: Self-pay | Admitting: Radiology

## 2017-01-16 MED ORDER — DEXTROSE 5 % IV SOLN
120.0000 mg | INTRAVENOUS | Status: AC
  Administered 2017-01-17: 120 mg via INTRAVENOUS
  Filled 2017-01-16: qty 3

## 2017-01-16 MED ORDER — SODIUM CHLORIDE 0.9 % IV SOLN
1.0000 g | INTRAVENOUS | Status: AC
  Administered 2017-01-17: 1 g via INTRAVENOUS
  Filled 2017-01-16: qty 1000

## 2017-01-16 NOTE — Pre-Procedure Instructions (Signed)
UNABLE TO REACH OR LM WITH PREOP INSTRUCTIONS AT NUMBER LISTED. AMY AT SURGEON'S OFFICE HAS INSTRUCTED

## 2017-01-16 NOTE — Telephone Encounter (Signed)
Pt called stating she has a pimple on her labia minora & that this often happens prior to her period. Pt asks if she may proceed with surgery tomorrow. Per Dr Sherryl BartersBudzyn, advised pt to plan to proceed as scheduled. Pt voices understanding.

## 2017-01-17 ENCOUNTER — Ambulatory Visit
Admission: RE | Admit: 2017-01-17 | Discharge: 2017-01-17 | Disposition: A | Discharge status: Home / Self Care | Payer: BC Managed Care – PPO | Source: Ambulatory Visit | Attending: Urology | Admitting: Urology

## 2017-01-17 ENCOUNTER — Encounter
Admission: RE | Disposition: A | Discharge status: Home / Self Care | Payer: Self-pay | Source: Ambulatory Visit | Attending: Urology

## 2017-01-17 ENCOUNTER — Encounter: Payer: Self-pay | Admitting: Anesthesiology

## 2017-01-17 ENCOUNTER — Ambulatory Visit: Payer: BC Managed Care – PPO | Admitting: Anesthesiology

## 2017-01-17 DIAGNOSIS — G4730 Sleep apnea, unspecified: Secondary | ICD-10-CM | POA: Diagnosis not present

## 2017-01-17 DIAGNOSIS — Z888 Allergy status to other drugs, medicaments and biological substances status: Secondary | ICD-10-CM | POA: Insufficient documentation

## 2017-01-17 DIAGNOSIS — Z9989 Dependence on other enabling machines and devices: Secondary | ICD-10-CM | POA: Diagnosis not present

## 2017-01-17 DIAGNOSIS — N132 Hydronephrosis with renal and ureteral calculous obstruction: Secondary | ICD-10-CM | POA: Diagnosis not present

## 2017-01-17 DIAGNOSIS — Z9884 Bariatric surgery status: Secondary | ICD-10-CM | POA: Insufficient documentation

## 2017-01-17 DIAGNOSIS — N202 Calculus of kidney with calculus of ureter: Secondary | ICD-10-CM | POA: Diagnosis not present

## 2017-01-17 DIAGNOSIS — F419 Anxiety disorder, unspecified: Secondary | ICD-10-CM | POA: Diagnosis not present

## 2017-01-17 DIAGNOSIS — F319 Bipolar disorder, unspecified: Secondary | ICD-10-CM | POA: Diagnosis not present

## 2017-01-17 DIAGNOSIS — N201 Calculus of ureter: Secondary | ICD-10-CM

## 2017-01-17 DIAGNOSIS — M1990 Unspecified osteoarthritis, unspecified site: Secondary | ICD-10-CM | POA: Diagnosis not present

## 2017-01-17 DIAGNOSIS — Z91041 Radiographic dye allergy status: Secondary | ICD-10-CM | POA: Diagnosis not present

## 2017-01-17 DIAGNOSIS — Z79899 Other long term (current) drug therapy: Secondary | ICD-10-CM | POA: Diagnosis not present

## 2017-01-17 DIAGNOSIS — Z8249 Family history of ischemic heart disease and other diseases of the circulatory system: Secondary | ICD-10-CM | POA: Diagnosis not present

## 2017-01-17 HISTORY — PX: CYSTOSCOPY W/ URETERAL STENT PLACEMENT: SHX1429

## 2017-01-17 HISTORY — PX: URETEROSCOPY WITH HOLMIUM LASER LITHOTRIPSY: SHX6645

## 2017-01-17 LAB — POCT PREGNANCY, URINE: PREG TEST UR: NEGATIVE

## 2017-01-17 SURGERY — URETEROSCOPY, WITH LITHOTRIPSY USING HOLMIUM LASER
Anesthesia: General | Laterality: Left | Wound class: Clean Contaminated

## 2017-01-17 MED ORDER — ONDANSETRON HCL 4 MG/2ML IJ SOLN
INTRAMUSCULAR | Status: AC
  Filled 2017-01-17: qty 2

## 2017-01-17 MED ORDER — FENTANYL CITRATE (PF) 100 MCG/2ML IJ SOLN
INTRAMUSCULAR | Status: AC
  Filled 2017-01-17: qty 2

## 2017-01-17 MED ORDER — OXYCODONE-ACETAMINOPHEN 5-325 MG PO TABS
ORAL_TABLET | ORAL | Status: AC
  Administered 2017-01-17: 1 via ORAL
  Filled 2017-01-17: qty 1

## 2017-01-17 MED ORDER — OXYCODONE-ACETAMINOPHEN 5-325 MG PO TABS
1.0000 | ORAL_TABLET | ORAL | Status: DC | PRN
  Administered 2017-01-17: 1 via ORAL

## 2017-01-17 MED ORDER — DEXAMETHASONE SODIUM PHOSPHATE 10 MG/ML IJ SOLN
INTRAMUSCULAR | Status: AC
  Filled 2017-01-17: qty 1

## 2017-01-17 MED ORDER — FAMOTIDINE 20 MG PO TABS: 20.0000 mg | ORAL_TABLET | Freq: Once | ORAL | Status: DC

## 2017-01-17 MED ORDER — PROPOFOL 10 MG/ML IV BOLUS
INTRAVENOUS | Status: DC | PRN
  Administered 2017-01-17: 50 mg via INTRAVENOUS
  Administered 2017-01-17: 150 mg via INTRAVENOUS

## 2017-01-17 MED ORDER — MIDAZOLAM HCL 2 MG/2ML IJ SOLN
INTRAMUSCULAR | Status: AC
  Filled 2017-01-17: qty 2

## 2017-01-17 MED ORDER — ONDANSETRON HCL 4 MG/2ML IJ SOLN
INTRAMUSCULAR | Status: DC | PRN
  Administered 2017-01-17: 4 mg via INTRAVENOUS

## 2017-01-17 MED ORDER — OXYCODONE-ACETAMINOPHEN 5-325 MG PO TABS: 1.0000 | ORAL_TABLET | ORAL | 0 refills | Status: DC | PRN

## 2017-01-17 MED ORDER — DEXAMETHASONE SODIUM PHOSPHATE 10 MG/ML IJ SOLN
INTRAMUSCULAR | Status: DC | PRN
  Administered 2017-01-17: 5 mg via INTRAVENOUS

## 2017-01-17 MED ORDER — LIDOCAINE HCL (PF) 2 % IJ SOLN
INTRAMUSCULAR | Status: DC | PRN
  Administered 2017-01-17: 50 mg

## 2017-01-17 MED ORDER — FENTANYL CITRATE (PF) 100 MCG/2ML IJ SOLN
INTRAMUSCULAR | Status: DC | PRN
  Administered 2017-01-17 (×4): 50 ug via INTRAVENOUS

## 2017-01-17 MED ORDER — PHENYLEPHRINE HCL 10 MG/ML IJ SOLN
INTRAMUSCULAR | Status: DC | PRN
  Administered 2017-01-17 (×3): 100 ug via INTRAVENOUS

## 2017-01-17 MED ORDER — MIDAZOLAM HCL 5 MG/5ML IJ SOLN
INTRAMUSCULAR | Status: DC | PRN
  Administered 2017-01-17: 2 mg via INTRAVENOUS

## 2017-01-17 MED ORDER — GLYCOPYRROLATE 0.2 MG/ML IJ SOLN
INTRAMUSCULAR | Status: DC | PRN
  Administered 2017-01-17: 0.2 mg via INTRAVENOUS

## 2017-01-17 MED ORDER — GLYCOPYRROLATE 0.2 MG/ML IJ SOLN
INTRAMUSCULAR | Status: AC
  Filled 2017-01-17: qty 1

## 2017-01-17 MED ORDER — LACTATED RINGERS IV SOLN
INTRAVENOUS | Status: DC
  Administered 2017-01-17: 12:00:00 via INTRAVENOUS

## 2017-01-17 MED ORDER — LIDOCAINE HCL (PF) 2 % IJ SOLN
INTRAMUSCULAR | Status: AC
  Filled 2017-01-17: qty 2

## 2017-01-17 MED ORDER — ONDANSETRON HCL 4 MG/2ML IJ SOLN: 4.0000 mg | Freq: Once | INTRAMUSCULAR | Status: DC | PRN

## 2017-01-17 MED ORDER — PROPOFOL 10 MG/ML IV BOLUS
INTRAVENOUS | Status: AC
  Filled 2017-01-17: qty 20

## 2017-01-17 MED ORDER — FENTANYL CITRATE (PF) 100 MCG/2ML IJ SOLN
INTRAMUSCULAR | Status: AC
  Administered 2017-01-17: 12.5 ug via INTRAVENOUS
  Filled 2017-01-17: qty 2

## 2017-01-17 MED ORDER — FENTANYL CITRATE (PF) 100 MCG/2ML IJ SOLN
25.0000 ug | INTRAMUSCULAR | Status: DC | PRN
  Administered 2017-01-17: 12.5 ug via INTRAVENOUS
  Administered 2017-01-17: 25 ug via INTRAVENOUS
  Administered 2017-01-17: 12.5 ug via INTRAVENOUS

## 2017-01-17 MED ORDER — CIPROFLOXACIN HCL 500 MG PO TABS: 500.0000 mg | ORAL_TABLET | Freq: Two times a day (BID) | ORAL | 0 refills | Status: DC

## 2017-01-17 SURGICAL SUPPLY — 31 items
BACTOSHIELD CHG 4% 4OZ (MISCELLANEOUS) ×2
BASKET ZERO TIP 1.9FR (BASKET) ×2 IMPLANT
BSKT STON RTRVL ZERO TP 1.9FR (BASKET) ×1
CATH URETL 5X70 OPEN END (CATHETERS) ×3 IMPLANT
CNTNR SPEC 2.5X3XGRAD LEK (MISCELLANEOUS) ×1
CONT SPEC 4OZ STER OR WHT (MISCELLANEOUS) ×2
CONT SPEC 4OZ STRL OR WHT (MISCELLANEOUS) ×1
CONTAINER SPEC 2.5X3XGRAD LEK (MISCELLANEOUS) ×1 IMPLANT
FIBER LASER LITHO 273 (Laser) ×2 IMPLANT
GLOVE BIO SURGEON STRL SZ7 (GLOVE) ×3 IMPLANT
GLOVE BIO SURGEON STRL SZ7.5 (GLOVE) ×3 IMPLANT
GOWN STRL REUS W/ TWL LRG LVL4 (GOWN DISPOSABLE) ×1 IMPLANT
GOWN STRL REUS W/TWL LRG LVL4 (GOWN DISPOSABLE) ×3
GOWN STRL REUS W/TWL XL LVL3 (GOWN DISPOSABLE) ×3 IMPLANT
GUIDEWIRE SUPER STIFF (WIRE) IMPLANT
INTRODUCER DILATOR DOUBLE (INTRODUCER) ×3 IMPLANT
KIT RM TURNOVER CYSTO AR (KITS) ×3 IMPLANT
PACK CYSTO AR (MISCELLANEOUS) ×3 IMPLANT
SCRUB CHG 4% DYNA-HEX 4OZ (MISCELLANEOUS) ×1 IMPLANT
SENSORWIRE 0.038 NOT ANGLED (WIRE) ×6
SET CYSTO W/LG BORE CLAMP LF (SET/KITS/TRAYS/PACK) ×3 IMPLANT
SHEATH URETERAL 13/15X36 1L (SHEATH) IMPLANT
SOL .9 NS 3000ML IRR  AL (IV SOLUTION) ×2
SOL .9 NS 3000ML IRR AL (IV SOLUTION) ×1
SOL .9 NS 3000ML IRR UROMATIC (IV SOLUTION) ×1 IMPLANT
STENT URET 6FRX24 CONTOUR (STENTS) ×2 IMPLANT
STENT URET 6FRX26 CONTOUR (STENTS) IMPLANT
SURGILUBE 2OZ TUBE FLIPTOP (MISCELLANEOUS) ×3 IMPLANT
SYRINGE IRR TOOMEY STRL 70CC (SYRINGE) ×3 IMPLANT
WATER STERILE IRR 1000ML POUR (IV SOLUTION) ×3 IMPLANT
WIRE SENSOR 0.038 NOT ANGLED (WIRE) ×1 IMPLANT

## 2017-01-17 NOTE — Anesthesia Preprocedure Evaluation (Addendum)
Anesthesia Evaluation  Patient identified by MRN, date of birth, ID band Patient awake    Reviewed: Allergy & Precautions, NPO status , Patient's Chart, lab work & pertinent test results, reviewed documented beta blocker date and time   Airway Mallampati: III  TM Distance: >3 FB     Dental  (+) Chipped, Missing   Pulmonary sleep apnea and Continuous Positive Airway Pressure Ventilation ,           Cardiovascular      Neuro/Psych  Headaches, PSYCHIATRIC DISORDERS Anxiety Depression Bipolar Disorder    GI/Hepatic   Endo/Other    Renal/GU      Musculoskeletal  (+) Arthritis ,   Abdominal   Peds  Hematology   Anesthesia Other Findings Gastric bypass. One missing tooth up.No reflux or regurg, I feel comfortable with LMA.  Reproductive/Obstetrics                            Anesthesia Physical Anesthesia Plan  ASA: III  Anesthesia Plan: General   Post-op Pain Management:    Induction: Intravenous  PONV Risk Score and Plan:   Airway Management Planned: LMA  Additional Equipment:   Intra-op Plan:   Post-operative Plan:   Informed Consent: I have reviewed the patients History and Physical, chart, labs and discussed the procedure including the risks, benefits and alternatives for the proposed anesthesia with the patient or authorized representative who has indicated his/her understanding and acceptance.     Plan Discussed with: CRNA  Anesthesia Plan Comments:         Anesthesia Quick Evaluation

## 2017-01-17 NOTE — Op Note (Signed)
Date of procedure: 01/17/17  Preoperative diagnosis:  1. Left ureteral stone 2. Left renal stone   Postoperative diagnosis:  1. Same   Procedure: 1. Cystoscopy 2. Left ureteroscopy 3. Laser lithotripsy 4. Stone basketing 5. Left retrograde pyelogram with interpretation 6. Left ureteral stent exchange 6 [place] by 24 cm   Surgeon: Baruch Gouty, MD  Anesthesia: General  Complications: None  Intraoperative findings: The patient's left ureteral stone was broken into small fragments and removed. Her small 3 mm left renal stent up into small fragments which removed with the stone basket. Left retrograde pyelogram showed no further filling defects and was used for aid in replacing left renal stent.  EBL: None  Specimens: Left ureteral stone to pathology  Drains: 6 French by [place] double-J ureteral stent  Disposition: Stable to the postanesthesia care unit  Indication for procedure: The patient is a 51 y.o. female with history of failed lithotripsy who ended up having a stent only placed on the left side due to being febrile and preoperative presents today now for definitive stone management after now have a negative urine cultures.  After reviewing the management options for treatment, the patient elected to proceed with the above surgical procedure(s). We have discussed the potential benefits and risks of the procedure, side effects of the proposed treatment, the likelihood of the patient achieving the goals of the procedure, and any potential problems that might occur during the procedure or recuperation. Informed consent has been obtained.  Description of procedure: The patient was met in the preoperative area. All risks, benefits, and indications of the procedure were described in great detail. The patient consented to the procedure. Preoperative antibiotics were given. The patient was taken to the operative theater. General anesthesia was induced per the anesthesia service. The  patient was then placed in the dorsal lithotomy position and prepped and draped in the usual sterile fashion. A preoperative timeout was called.   A 21 French 30 cystoscope was inserted to the patient's bladder per urethra atraumatically. The left ureteral stent was grasped flexible graspers and brought to level urethral meatus. A sensor wire was exchanged. It was removed. A sensor wire was confirmed to be advanced to the level of the left renal pelvis under fluoroscopy. The semirigid ureteroscope was then introduced into the bladder and then the left ureter orifice. The stone was encountered as expected in the left distal ureter. It was somewhat impacted. He has broken into smaller fragments with laser lithotripsy. These fragments were all removed with the stone basket. Pain ureteroscopy revealed no further stone burden within the ureter. A second sensor wire was then placed and the ureteroscope was withdrawn. Over the second sensor wire a flexible ureteroscope was advanced to level of the left renal pelvis under fluoroscopy and the second sensor wire was removed. The patient had a known 3 mm stone in the left lower pole. No other stones were seen. Upon further inspection of this left lower pole stone was actually 2 smaller fragments. Both of these fragments were grasped in one bite with the stone basket and removed and sent to pathology. A left retrograde pyelogram confirmed no further filling defects or stone burden. With the aid of the cystoscope over the remaining sensor wire 6 French by 24 cm double-J ureteral stent was placed and the sensor was removed. A curl seen in the patient's renal pelvis on fluoroscopy and in the urinary bladder under direct localization. It was determined not to leave a string on the stent  as the stone was mildly impacted on his concern that the stent may follow-up to soon. The patient's bladder was then drained this point she was woken from anesthesia and transferred stable  condition to postanesthesia care unit.  Plan: The patient will follow-up for cystoscopy stent removal in the office next week. She'll need a renal ultrasound Road iatrogenic hydronephrosis in one month. We'll discuss her stone analysis results when available.  Baruch Gouty, M.D.

## 2017-01-17 NOTE — Interval H&P Note (Signed)
History and Physical Interval Note:  01/17/2017 1:05 PM  Mariah Reeves  has presented today for surgery, with the diagnosis of left ureteral stone  The various methods of treatment have been discussed with the patient and family. After consideration of risks, benefits and other options for treatment, the patient has consented to  Procedure(s): URETEROSCOPY WITH HOLMIUM LASER LITHOTRIPSY (Left) CYSTOSCOPY WITH STENT REPLACEMENT (Left) as a surgical intervention .  The patient's history has been reviewed, patient examined, no change in status, stable for surgery.  I have reviewed the patient's chart and labs.  Questions were answered to the patient's satisfaction.    RRR Lungs clear  Patient returns at this point after having a left ureteral stent placements for fever. Her cultures are now negative. She presents today for definitive management of her stones.  Hildred LaserBrian James Jaxon Mynhier

## 2017-01-17 NOTE — Anesthesia Post-op Follow-up Note (Signed)
Anesthesia QCDR form completed.        

## 2017-01-17 NOTE — Transfer of Care (Signed)
Immediate Anesthesia Transfer of Care Note  Patient: Mariah Reeves  Procedure(s) Performed: Procedure(s): URETEROSCOPY WITH HOLMIUM LASER LITHOTRIPSY (Left) CYSTOSCOPY WITH STENT REPLACEMENT (Left)  Patient Location: PACU  Anesthesia Type:General  Level of Consciousness: awake  Airway & Oxygen Therapy: Patient Spontanous Breathing and Patient connected to face mask oxygen  Post-op Assessment: Report given to RN and Post -op Vital signs reviewed and stable  Post vital signs: Reviewed  Last Vitals:  Vitals:   01/17/17 1120 01/17/17 1420  BP: 131/74 109/74  Pulse:  90  Resp:  12  Temp:  36.6 C    Last Pain:  Vitals:   01/17/17 1115  TempSrc: Tympanic         Complications: No apparent anesthesia complications

## 2017-01-17 NOTE — Anesthesia Procedure Notes (Signed)
Procedure Name: LMA Insertion Performed by: Dhrithi Riche Pre-anesthesia Checklist: Patient identified, Patient being monitored, Timeout performed, Emergency Drugs available and Suction available Patient Re-evaluated:Patient Re-evaluated prior to induction Oxygen Delivery Method: Circle system utilized Preoxygenation: Pre-oxygenation with 100% oxygen Induction Type: IV induction Ventilation: Mask ventilation without difficulty LMA: LMA inserted LMA Size: 3.5 Tube type: Oral Number of attempts: 1 Placement Confirmation: positive ETCO2 and breath sounds checked- equal and bilateral Tube secured with: Tape Dental Injury: Teeth and Oropharynx as per pre-operative assessment        

## 2017-01-17 NOTE — H&P (View-Only) (Signed)
12/21/2016 3:07 PM   Mariah Reeves [birth year] [id number]  Referring provider: Dortha KernBliss, Laura K, MD [address] [place], [place] [id number]  Chief Complaint  Patient presents with  . Follow-up    2 week  kidney stone    HPI: The patient is a 51 year old female who presents today for follow up of 6 mm mid to distal left ureteral stone. She also has a nonobstructing 3 mm stone in the middle left pole. She underwent lithotripsy in 12/07/2016 for the left ureteral calculus however due to the patient's inability to tolerate the procedure, she only received approximately 20% of the standard energy level. On KUB today, her left ureteral stone is in similar position as it was on the day of treatment in the distal ureter.   PMH: Past Medical History:  Diagnosis Date  . Allergy   . Anomaly, uterus    tumor of uterus  . Bilateral occipital neuralgia   . Bipolar 1 disorder (HCC)   . Chronic back pain   . Degenerative disc disease, lumbar   . Depression   . Facet syndrome, lumbar (HCC)   . Kidney stone   . Patella fracture     Surgical History: Past Surgical History:  Procedure Laterality Date  . ABDOMINOPLASTY  2006  . CARPAL TUNNEL RELEASE Left   . CHOLECYSTECTOMY    . DILATATION & CURETTAGE/HYSTEROSCOPY WITH MYOSURE N/A 07/08/2015   Procedure: DILATATION & CURETTAGE/HYSTEROSCOPY WITH MYOSURE;  Surgeon: Nadara Mustardobert P Harris, MD;  Location: [facility];  Service: Gynecology;  Laterality: N/A;  . DILATION AND CURETTAGE OF UTERUS     1/17  . EXTRACORPOREAL SHOCK WAVE LITHOTRIPSY Left 12/07/2016   Procedure: EXTRACORPOREAL SHOCK WAVE LITHOTRIPSY (ESWL);  Surgeon: Hildred LaserBudzyn, Madlynn Lundeen James, MD;  Location: [facility];  Service: Urology;  Laterality: Left;  Marland Kitchen. GASTRIC BYPASS    . KNEE SURGERY    . LITHOTRIPSY    . MIDDLE EAR SURGERY    . NASAL SEPTUM SURGERY    . URETEROSCOPY WITH HOLMIUM LASER LITHOTRIPSY     perforated ureter. had nephrostomy tube for brief time    Home Medications:  Allergies as  of 12/21/2016      Reactions   Contrast Media [iodinated Diagnostic Agents] Hives   Tape Hives      Medication List       Accurate as of 12/21/16  3:07 PM. Always use your most recent med list.          diazepam 5 MG tablet Commonly known as:  VALIUM Take 1 tablet (5 mg total) by mouth every 8 (eight) hours as needed for muscle spasms.   escitalopram 20 MG tablet Commonly known as:  LEXAPRO Take 20 mg by mouth at bedtime. Reported on 07/08/2015   ferrous sulfate 325 (65 FE) MG tablet Take 650 mg by mouth daily.   gabapentin 600 MG tablet Commonly known as:  NEURONTIN Take 600 mg by mouth as needed.   HYDROcodone-acetaminophen 10-325 MG tablet Commonly known as:  NORCO Limit one tablet by mouth 3-5 times per day if tolerated   lamoTRIgine 200 MG tablet Commonly known as:  LAMICTAL Take 400 mg by mouth at bedtime. Reported on 11/18/2015   lidocaine 5 % Commonly known as:  LIDODERM Place 1 patch onto the skin every 12 (twelve) hours. Remove & Discard patch within 12 hours or as directed by MD   meclizine 25 MG tablet Commonly known as:  ANTIVERT Take 1 tablet (25 mg total) by mouth  3 (three) times daily as needed for dizziness or nausea.   MULTIVITAMIN PO Take 1 tablet by mouth daily.   ondansetron 4 MG disintegrating tablet Commonly known as:  ZOFRAN-ODT Take 4 mg by mouth 3 (three) times daily as needed for nausea or vomiting. Reported on 11/18/2015   tamsulosin 0.4 MG Caps capsule Commonly known as:  FLOMAX Take 1 capsule (0.4 mg total) by mouth daily.   traZODone 100 MG tablet Commonly known as:  DESYREL Take 100 mg by mouth at bedtime as needed.       Allergies:  Allergies  Allergen Reactions  . Contrast Media [Iodinated Diagnostic Agents] Hives  . Tape Hives    Family History: Family History  Problem Relation Age of Onset  . Hypertension Mother   . Diabetes Mother   . Heart disease Father   . Alcohol abuse Father   . Breast cancer Maternal  Grandmother   . Kidney cancer Neg Hx   . Bladder Cancer Neg Hx     Social History:  reports that she has never smoked. She has never used smokeless tobacco. She reports that she does not drink alcohol or use drugs.  ROS: UROLOGY Frequent Urination?: No Hard to postpone urination?: No Burning/pain with urination?: No Get up at night to urinate?: No Leakage of urine?: No Urine stream starts and stops?: No Trouble starting stream?: No Do you have to strain to urinate?: No Blood in urine?: No Urinary tract infection?: No Sexually transmitted disease?: No Injury to kidneys or bladder?: No Painful intercourse?: No Weak stream?: No Currently pregnant?: No Vaginal bleeding?: No Last menstrual period?: 11/2016  Gastrointestinal Nausea?: Yes Vomiting?: No Indigestion/heartburn?: No Diarrhea?: Yes Constipation?: No  Constitutional Fever: No Night sweats?: No Weight loss?: No Fatigue?: No  Skin Skin rash/lesions?: No Itching?: No  Eyes Blurred vision?: No Double vision?: No  Ears/Nose/Throat Sore throat?: No Sinus problems?: No  Hematologic/Lymphatic Swollen glands?: No Easy bruising?: No  Cardiovascular Leg swelling?: No Chest pain?: No  Respiratory Cough?: No Shortness of breath?: No  Endocrine Excessive thirst?: No  Musculoskeletal Back pain?: Yes Joint pain?: No  Neurological Headaches?: No Dizziness?: No  Psychologic Depression?: No Anxiety?: No  Physical Exam: BP 106/75   Pulse (!) 142   Ht 5' (1.524 m)   Wt 190 lb 6.4 oz (86.4 kg)   LMP 11/24/2016   BMI 37.18 kg/m   Constitutional:  Alert and oriented, No acute distress. HEENT: [place] AT, moist mucus membranes.  Trachea midline, no masses. Cardiovascular: No clubbing, cyanosis, or edema. Respiratory: Normal respiratory effort, no increased work of breathing. GI: Abdomen is soft, nontender, nondistended, no abdominal masses GU: No CVA tenderness.  Skin: No rashes, bruises or  suspicious lesions. Lymph: No cervical or inguinal adenopathy. Neurologic: Grossly intact, no focal deficits, moving all 4 extremities. Psychiatric: Normal mood and affect.  Laboratory Data: Lab Results  Component Value Date   WBC 5.4 10/17/2016   HGB 11.9 (L) 10/17/2016   HCT 35.3 10/17/2016   MCV 84.8 10/17/2016   PLT 276 10/17/2016    Lab Results  Component Value Date   CREATININE 0.66 10/17/2016    No results found for: PSA  No results found for: TESTOSTERONE  No results found for: HGBA1C  Urinalysis    Component Value Date/Time   COLORURINE Yellow 05/19/2014 1628   APPEARANCEUR Clear 05/19/2014 1628   LABSPEC 1.021 05/19/2014 1628   PHURINE 5.0 05/19/2014 1628   GLUCOSEU Negative 05/19/2014 1628   HGBUR 1+ 05/19/2014  1628   BILIRUBINUR Negative 05/19/2014 1628   KETONESUR Negative 05/19/2014 1628   PROTEINUR Negative 05/19/2014 1628   NITRITE Negative 05/19/2014 1628   LEUKOCYTESUR Negative 05/19/2014 1628    Pertinent Imaging: KUB reviewed as above.  Assessment & Plan:    1. Left distal ureteral stone - 6 mm 2. Left nonobstructing midpole stone - 3 mm I discussed the patient that her stone is still present in the distal left ureter. We did discuss options which include continued metabolic expulsive therapy, ureteroscopy, and lithotripsy. I recommended against lithotripsy as she did not tolerate this well previously for this stone. She elected to proceed with cystoscopy, left ureteroscopy, laser lithotripsy, left stent. She does understand this procedure goes well that we may try to address a 3 mm stone in her left mid pole. She is very nervous about having a stent and would strongly prefer being able to remove the stent herself via string. Discussed the risks, benefits, indications of this procedure. She is agreeable to proceeding.    Jametta Moorehead James Amira Podolak, MD  [facility] [address], [address] [place], [place] [address] [phone]  

## 2017-01-18 ENCOUNTER — Telehealth: Payer: Self-pay | Admitting: Radiology

## 2017-01-18 ENCOUNTER — Encounter: Payer: Self-pay | Admitting: Urology

## 2017-01-18 NOTE — Telephone Encounter (Signed)
Pt called stating she only received 4 tablets of Cipro upon discharge from hospital. Confirmed with Dr Sherryl BartersBudzyn this is correct. Pt states she has improved urination since surgery & has some discomfort but not severe & improving as well.

## 2017-01-18 NOTE — Anesthesia Postprocedure Evaluation (Signed)
Anesthesia Post Note  Patient: Vergia AlbertsJoy A Grigoryan  Procedure(s) Performed: Procedure(s) (LRB): URETEROSCOPY WITH HOLMIUM LASER LITHOTRIPSY (Left) CYSTOSCOPY WITH STENT REPLACEMENT (Left)  Patient location during evaluation: PACU Anesthesia Type: General Level of consciousness: awake and alert Pain management: pain level controlled Vital Signs Assessment: post-procedure vital signs reviewed and stable Respiratory status: spontaneous breathing, nonlabored ventilation, respiratory function stable and patient connected to nasal cannula oxygen Cardiovascular status: blood pressure returned to baseline and stable Postop Assessment: no signs of nausea or vomiting Anesthetic complications: no     Last Vitals:  Vitals:   01/17/17 1558 01/17/17 1630  BP: 111/73 119/73  Pulse: 94 93  Resp: 16 16  Temp: 37 C 37.1 C  SpO2: 97% 97%    Last Pain:  Vitals:   01/17/17 1630  TempSrc: Temporal  PainSc: 0-No pain                 Nayvie Lips S

## 2017-01-23 LAB — STONE ANALYSIS
Ca Oxalate,Dihydrate: 2 %
Ca Oxalate,Monohydr.: 88 %
Ca phos cry stone ql IR: 10 %
Stone Weight KSTONE: 36 mg

## 2017-01-26 ENCOUNTER — Ambulatory Visit (INDEPENDENT_AMBULATORY_CARE_PROVIDER_SITE_OTHER): Payer: BC Managed Care – PPO | Admitting: Urology

## 2017-01-26 VITALS — BP 107/74 | HR 93 | Ht 60.0 in | Wt 190.3 lb

## 2017-01-26 DIAGNOSIS — N200 Calculus of kidney: Secondary | ICD-10-CM | POA: Diagnosis not present

## 2017-01-26 LAB — URINALYSIS, COMPLETE
Bilirubin, UA: NEGATIVE
GLUCOSE, UA: NEGATIVE
NITRITE UA: NEGATIVE
PH UA: 5 (ref 5.0–7.5)
Specific Gravity, UA: 1.025 (ref 1.005–1.030)
Urobilinogen, Ur: 0.2 mg/dL (ref 0.2–1.0)

## 2017-01-26 LAB — MICROSCOPIC EXAMINATION

## 2017-01-26 MED ORDER — CIPROFLOXACIN HCL 500 MG PO TABS
500.0000 mg | ORAL_TABLET | Freq: Once | ORAL | Status: AC
  Administered 2017-01-26: 500 mg via ORAL

## 2017-01-26 MED ORDER — LIDOCAINE HCL 2 % EX GEL
1.0000 "application " | Freq: Once | CUTANEOUS | Status: AC
  Administered 2017-01-26: 1 via URETHRAL

## 2017-01-26 NOTE — Progress Notes (Signed)
01/26/2017 7:22 AM   Mariah Reeves 05-14-1966 [id number]  Referring provider: Dortha Kern, MD [address] [place], [place] [address]  CC: Cysto stent pull, f/u kidney stones  HPI:  1 - Recurrent Nephrolithiasis -  Pre 2018 - SWL x2, URS x1 01/2017 - SWL then URS to stone free for 53mm left distal ureteral stone  2 - Medical Stone Disease - she has h/o R Eval 2018 - BMP (low-normal Ca at 8.6); Composition - 90% CaOx / 10 % CaPO4; 24 hr Urines - pending  Today "Mariah Reeves" is seen for cysto stent pull and f/u above. No interval fevers.      PMH: Past Medical History:  Diagnosis Date  . Allergy   . Anomaly, uterus    tumor of uterus  . Anxiety   . Bilateral occipital neuralgia   . Bipolar 1 disorder (HCC)   . Chronic back pain   . Degenerative disc disease, lumbar   . Depression   . Facet syndrome, lumbar (HCC)   . Headache    Migraines  . History of kidney stones    Left  . Hx of vertigo   . Nephrolithiasis 01/26/2017  . Patella fracture    Left  . Sleep apnea    C-PAP    Surgical History: Past Surgical History:  Procedure Laterality Date  . ABDOMINOPLASTY  2006  . CARPAL TUNNEL RELEASE Left   . CHOLECYSTECTOMY    . CYSTOSCOPY W/ URETERAL STENT PLACEMENT  01/03/2017   Procedure: CYSTOSCOPY WITH RETROGRADE PYELOGRAM/URETERAL STENT PLACEMENT;  Surgeon: Hildred Laser, MD;  Location: [facility];  Service: Urology;;  . Bluford Kaufmann W/ URETERAL STENT PLACEMENT Left 01/17/2017   Procedure: CYSTOSCOPY WITH STENT REPLACEMENT;  Surgeon: Hildred Laser, MD;  Location: [facility];  Service: Urology;  Laterality: Left;  . DILATATION & CURETTAGE/HYSTEROSCOPY WITH MYOSURE N/A 07/08/2015   Procedure: DILATATION & CURETTAGE/HYSTEROSCOPY WITH MYOSURE;  Surgeon: Nadara Mustard, MD;  Location: [facility];  Service: Gynecology;  Laterality: N/A;  . DILATION AND CURETTAGE OF UTERUS     1/17  . EXTRACORPOREAL SHOCK WAVE LITHOTRIPSY Left 12/07/2016   Procedure: EXTRACORPOREAL SHOCK WAVE  LITHOTRIPSY (ESWL);  Surgeon: Hildred Laser, MD;  Location: [facility];  Service: Urology;  Laterality: Left;  Marland Kitchen GASTRIC BYPASS    . KNEE SURGERY    . LITHOTRIPSY    . MIDDLE EAR SURGERY    . NASAL SEPTUM SURGERY    . URETEROSCOPY WITH HOLMIUM LASER LITHOTRIPSY     perforated ureter. had nephrostomy tube for brief time  . URETEROSCOPY WITH HOLMIUM LASER LITHOTRIPSY Left 01/17/2017   Procedure: URETEROSCOPY WITH HOLMIUM LASER LITHOTRIPSY;  Surgeon: Hildred Laser, MD;  Location: [facility];  Service: Urology;  Laterality: Left;    Home Medications:  Allergies as of 01/26/2017      Reactions   Contrast Media [iodinated Diagnostic Agents] Hives   Tape Hives   Adhesive Tape      Medication List       Accurate as of 01/26/17  7:22 AM. Always use your most recent med list.          acetaminophen 500 MG tablet Commonly known as:  TYLENOL Take 1,000 mg by mouth 2 (two) times daily as needed for headache.   ciprofloxacin 500 MG tablet Commonly known as:  CIPRO Take 1 tablet (500 mg total) by mouth 2 (two) times daily.   escitalopram 20 MG tablet Commonly known as:  LEXAPRO Take 20 mg by  mouth at bedtime. Reported on 07/08/2015   ferrous sulfate 325 (65 FE) MG tablet Take 650 mg by mouth daily.   gabapentin 600 MG tablet Commonly known as:  NEURONTIN Take 600 mg by mouth daily.   lamoTRIgine 200 MG tablet Commonly known as:  LAMICTAL Take 400 mg by mouth at bedtime. Reported on 11/18/2015   MULTIVITAMIN PO Take 1 tablet by mouth daily.   ondansetron 4 MG disintegrating tablet Commonly known as:  ZOFRAN-ODT Take 4 mg by mouth daily as needed for nausea or vomiting. Reported on 11/18/2015   oxyCODONE-acetaminophen 5-325 MG tablet Commonly known as:  ROXICET Take 1 tablet by mouth every 4 (four) hours as needed for severe pain.   traZODone 100 MG tablet Commonly known as:  DESYREL Take 100 mg by mouth at bedtime as needed for sleep.       Allergies:  Allergies    Allergen Reactions  . Contrast Media [Iodinated Diagnostic Agents] Hives  . Tape Hives    Adhesive Tape    Family History: Family History  Problem Relation Age of Onset  . Hypertension Mother   . Diabetes Mother   . Heart disease Father   . Alcohol abuse Father   . Breast cancer Maternal Grandmother   . Kidney cancer Neg Hx   . Bladder Cancer Neg Hx     Social History:  reports that she has never smoked. She has never used smokeless tobacco. She reports that she does not drink alcohol or use drugs.    Review of Systems  Gastrointestinal (upper)  : Negative for upper GI symptoms  Gastrointestinal (lower) : Negative for lower GI symptoms  Constitutional : Negative for symptoms  Skin: Negative for skin symptoms  Eyes: Negative for eye symptoms  Ear/Nose/Throat : Negative for Ear/Nose/Throat symptoms  Hematologic/Lymphatic: Negative for Hematologic/Lymphatic symptoms  Cardiovascular : Negative for cardiovascular symptoms  Respiratory : Negative for respiratory symptoms  Endocrine: Negative for endocrine symptoms  Musculoskeletal: Negative for musculoskeletal symptoms  Neurological: Negative for neurological symptoms  Psychologic: Negative for psychiatric symptoms  Physical Exam: There were no vitals taken for this visit.  Constitutional:  Alert and oriented, No acute distress. HEENT: [place] AT, moist mucus membranes.  Trachea midline, no masses. Cardiovascular: No clubbing, cyanosis, or edema. Respiratory: Normal respiratory effort, no increased work of breathing. GI: Abdomen is soft, nontender, nondistended, no abdominal masses GU: No CVA tenderness.  Skin: No rashes, bruises or suspicious lesions. Lymph: No cervical or inguinal adenopathy. Neurologic: Grossly intact, no focal deficits, moving all 4 extremities. Psychiatric: Normal mood and affect.  Laboratory Data: Lab Results  Component Value Date   WBC 8.1 01/05/2017   HGB 11.7 (L)  01/05/2017   HCT 34.8 (L) 01/05/2017   MCV 87.0 01/05/2017   PLT 271 01/05/2017    Lab Results  Component Value Date   CREATININE 0.89 01/05/2017    No results found for: PSA  No results found for: TESTOSTERONE  No results found for: HGBA1C  Urinalysis    Component Value Date/Time   COLORURINE Yellow 05/19/2014 1628   APPEARANCEUR Cloudy (A) 01/10/2017 1017   LABSPEC 1.021 05/19/2014 1628   PHURINE 5.0 05/19/2014 1628   GLUCOSEU Negative 01/10/2017 1017   GLUCOSEU Negative 05/19/2014 1628   HGBUR 1+ 05/19/2014 1628   BILIRUBINUR Negative 01/10/2017 1017   BILIRUBINUR Negative 05/19/2014 1628   KETONESUR Negative 05/19/2014 1628   PROTEINUR 3+ (A) 01/10/2017 1017   PROTEINUR Negative 05/19/2014 1628   NITRITE Negative 01/10/2017 1017  NITRITE Negative 05/19/2014 1628   LEUKOCYTESUR Trace (A) 01/10/2017 1017   LEUKOCYTESUR Negative 05/19/2014 1628    Pertinent Imaging: CT 2018 reveiwed as per abvoe  Cystoscopy/ Stent removal procedure  Patient identification was confirmed, informed consent was obtained, and patient was prepped using Betadine solution.  Lidocaine jelly was administered per urethral meatus.    Preoperative abx where received prior to procedure.    Procedure: - Flexible cystoscope introduced, without any difficulty.   - Thorough search of the bladder revealed:    normal urethral meatus  Stent seen emanating from LEFT ureteral orifice, grasped with stent graspers, and removed in entirety.      Post-Procedure: - Patient tolerated the procedure well  Assessment & Plan:    1 - Recurrent Nephrolithiasis - now stone free, rec yearly imaging surveillance with KUB, RUS given recurrecne pattern.   2 - Medical Stone Disease - partial eval complete. Sig suspiciosn for renal leak hypercalciuria given low-normal serum Ca and CaOX/CaPO4 composition. Rec complete 24 hr urines and consider daily thiazide if 24 hr urines confirm high urine Ca.  RTC with 24  hr urines.    Sebastian Ache, MD  [facility] [address] [place], [place] [address] [phone]

## 2017-01-29 ENCOUNTER — Encounter: Payer: Self-pay | Admitting: Physical Therapy

## 2017-01-31 ENCOUNTER — Encounter: Payer: Self-pay | Admitting: *Deleted

## 2017-01-31 ENCOUNTER — Emergency Department: Payer: BC Managed Care – PPO

## 2017-01-31 ENCOUNTER — Emergency Department
Admission: EM | Admit: 2017-01-31 | Discharge: 2017-01-31 | Disposition: A | Discharge status: Home / Self Care | Payer: BC Managed Care – PPO | Attending: Emergency Medicine | Admitting: Emergency Medicine

## 2017-01-31 DIAGNOSIS — G8929 Other chronic pain: Secondary | ICD-10-CM | POA: Insufficient documentation

## 2017-01-31 DIAGNOSIS — Y9301 Activity, walking, marching and hiking: Secondary | ICD-10-CM | POA: Diagnosis not present

## 2017-01-31 DIAGNOSIS — Y999 Unspecified external cause status: Secondary | ICD-10-CM | POA: Insufficient documentation

## 2017-01-31 DIAGNOSIS — S52592A Other fractures of lower end of left radius, initial encounter for closed fracture: Secondary | ICD-10-CM | POA: Diagnosis not present

## 2017-01-31 DIAGNOSIS — Y929 Unspecified place or not applicable: Secondary | ICD-10-CM | POA: Insufficient documentation

## 2017-01-31 DIAGNOSIS — S62102A Fracture of unspecified carpal bone, left wrist, initial encounter for closed fracture: Secondary | ICD-10-CM

## 2017-01-31 DIAGNOSIS — Z79899 Other long term (current) drug therapy: Secondary | ICD-10-CM | POA: Diagnosis not present

## 2017-01-31 DIAGNOSIS — S6992XA Unspecified injury of left wrist, hand and finger(s), initial encounter: Secondary | ICD-10-CM | POA: Diagnosis present

## 2017-01-31 DIAGNOSIS — W010XXA Fall on same level from slipping, tripping and stumbling without subsequent striking against object, initial encounter: Secondary | ICD-10-CM | POA: Diagnosis not present

## 2017-01-31 MED ORDER — HYDROCODONE-ACETAMINOPHEN 5-325 MG PO TABS
2.0000 | ORAL_TABLET | Freq: Once | ORAL | Status: AC
  Administered 2017-01-31: 2 via ORAL
  Filled 2017-01-31: qty 2

## 2017-01-31 NOTE — ED Provider Notes (Signed)
[facility] Emergency Department Provider Note  ____________________________________________   None    (approximate)  I have reviewed the triage vital signs and the nursing notes.   HISTORY  Chief Complaint Wrist Pain    HPI Mariah Reeves is a 51 y.o. female right-hand dominant and unemployed woman who comes to the emergency department with sudden onset severe left wrist pain after tripping over a cat carrier and falling on an outstretched hand shortly before arrival. She has severe pain on her distal wrist. No numbness or weakness. She did not hit her head. No elbow pain.   Past Medical History:  Diagnosis Date  . Allergy   . Anomaly, uterus    tumor of uterus  . Anxiety   . Bilateral occipital neuralgia   . Bipolar 1 disorder (HCC)   . Chronic back pain   . Degenerative disc disease, lumbar   . Depression   . Facet syndrome, lumbar (HCC)   . Headache    Migraines  . History of kidney stones    Left  . Hx of vertigo   . Nephrolithiasis 01/26/2017  . Patella fracture    Left  . Sleep apnea    C-PAP    Patient Active Problem List   Diagnosis Date Noted  . Nephrolithiasis 01/26/2017  . Left ureteral stone 01/03/2017  . Endometrial disorder 07/08/2015  . Bilateral occipital neuralgia 03/02/2015  . DDD (degenerative disc disease), lumbar 12/24/2014  . Facet syndrome, lumbar (HCC) 12/24/2014  . 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 ABDOMINOPLASTY  2006  . CARPAL TUNNEL RELEASE Left   . CHOLECYSTECTOMY    . CYSTOSCOPY W/ URETERAL STENT PLACEMENT  01/03/2017   Procedure: CYSTOSCOPY WITH RETROGRADE PYELOGRAM/URETERAL STENT PLACEMENT;  Surgeon: Hildred Laser, MD;  Location: [facility] ORS;  Service: Urology;;  . Bluford Kaufmann W/ URETERAL STENT PLACEMENT Left 01/17/2017   Procedure: CYSTOSCOPY WITH STENT REPLACEMENT;  Surgeon: Hildred Laser, MD;  Location: [facility] ORS;  Service:  Urology;  Laterality: Left;  . DILATATION & CURETTAGE/HYSTEROSCOPY WITH MYOSURE N/A 07/08/2015   Procedure: DILATATION & CURETTAGE/HYSTEROSCOPY WITH MYOSURE;  Surgeon: Nadara Mustard, MD;  Location: [facility] ORS;  Service: Gynecology;  Laterality: N/A;  . DILATION AND CURETTAGE OF UTERUS     1/17  . EXTRACORPOREAL SHOCK WAVE LITHOTRIPSY Left 12/07/2016   Procedure: EXTRACORPOREAL SHOCK WAVE LITHOTRIPSY (ESWL);  Surgeon: Hildred Laser, MD;  Location: [facility] ORS;  Service: Urology;  Laterality: Left;  Marland Kitchen GASTRIC BYPASS    . KNEE SURGERY    . LITHOTRIPSY    . MIDDLE EAR SURGERY    . NASAL SEPTUM SURGERY    . URETEROSCOPY WITH HOLMIUM LASER LITHOTRIPSY     perforated ureter. had nephrostomy tube for brief time  . URETEROSCOPY WITH HOLMIUM LASER LITHOTRIPSY Left 01/17/2017   Procedure: URETEROSCOPY WITH HOLMIUM LASER LITHOTRIPSY;  Surgeon: Hildred Laser, MD;  Location: [facility] ORS;  Service: Urology;  Laterality: Left;    Prior to Admission medications   Medication Sig Start Date End Date Taking? Authorizing Provider  acetaminophen (TYLENOL) 500 MG tablet Take 1,000 mg by mouth 2 (two) times daily as needed for headache.    [provider]  escitalopram (LEXAPRO) 20 MG tablet Take 20 mg by mouth at bedtime. Reported on 07/08/2015    [provider]  ferrous sulfate 325 (65 FE) MG tablet Take 650 mg by mouth daily.    [provider]  gabapentin (NEURONTIN) 600 MG tablet Take 600 mg by mouth daily.     [provider]  lamoTRIgine (LAMICTAL) 200 MG tablet Take 400 mg by mouth at bedtime. Reported on 11/18/2015    [provider]  Multiple Vitamins-Minerals (MULTIVITAMIN PO) Take 1 tablet by mouth daily.    [provider]  ondansetron (ZOFRAN-ODT) 4 MG disintegrating tablet Take 4 mg by mouth daily as needed for nausea or vomiting. Reported on 11/18/2015    [provider]  oxyCODONE-acetaminophen (ROXICET) 5-325 MG tablet Take 1 tablet by  mouth every 4 (four) hours as needed for severe pain. Patient not taking: Reported on 01/26/2017 01/17/17   Hildred Laser, MD  traZODone (DESYREL) 100 MG tablet Take 100 mg by mouth at bedtime as needed for sleep.     [provider]    Allergies Contrast media [iodinated diagnostic agents] and Tape  Family History  Problem Relation Age of Onset  . Hypertension Mother   . Diabetes Mother   . Heart disease Father   . Alcohol abuse Father   . Breast cancer Maternal Grandmother   . Kidney cancer Neg Hx   . Bladder Cancer Neg Hx     Social History Social History  Substance Use Topics  . Smoking status: Never Smoker  . Smokeless tobacco: Never Used  . Alcohol use No    Review of Systems Constitutional: No fever/chills ENT: No sore throat. Cardiovascular: Denies chest pain. Respiratory: Denies shortness of breath. Gastrointestinal: No abdominal pain.  No nausea, no vomiting.  No diarrhea.  No constipation. Musculoskeletal: Negative for back pain. Neurological: Negative for headaches   ____________________________________________   PHYSICAL EXAM:  VITAL SIGNS: ED Triage Vitals [01/31/17 1926]  Enc Vitals Group     BP (!) 150/98     Pulse Rate 97     Resp 16     Temp 98.2 F (36.8 C)     Temp Source Oral     SpO2 93 %     Weight 190 lb (86.2 kg)     Height 5' (1.524 m)     Head Circumference      Peak Flow      Pain Score 10     Pain Loc      Pain Edu?      Excl. in GC?     Constitutional: Alert and oriented 4 pleasant cooperative speaks full clear sentences Head: Atraumatic. Nose: No congestion/rhinnorhea. Mouth/Throat: No trismus Neck: No stridor.   Cardiovascular: Regular rate and rhythm Respiratory: Normal respiratory effort.  No retractions. Musculoskeletal:  Some tenderness over distal ulnar greater than distal radius. No tenderness over snuffbox and no axial load discomfort Sensation intact to light touch over first dorsal webspace,  distal index finger, distal small finger Can flex and oppose  thumb, cross 2 on 3, and extend wrist 2+ radial pulse and less than 2 second capillary refill Skin is closed Compartments are soft Can fully supinate and pronate at the elbow Neurologic:  Normal speech and language. No gross focal neurologic deficits are appreciated.  Skin:  Skin is warm, dry and intact. No rash noted.    ____________________________________________  LABS (all labs ordered are listed, but only abnormal results are displayed)  Labs Reviewed - No data to display   __________________________________________  EKG   ____________________________________________  RADIOLOGY  X-ray shows nondisplaced distal radius fracture ____________________________________________   PROCEDURES  Procedure(s) performed: yes  SPLINT APPLICATION Date/Time: 11:06 PM Authorized by: Merrily Brittle Consent:  Verbal consent obtained. Risks and benefits: risks, benefits and alternatives were discussed Consent given by: patient Splint applied by: ER tech Location details: Left wrist  Splint type: Short arm volar  Supplies used: Ortho-Glass  Post-procedure: The splinted body part was neurovascularly unchanged following the procedure. Patient tolerance: Patient tolerated the procedure well with no immediate complications.     Procedures  Critical Care performed: no  Observation: no ____________________________________________   INITIAL IMPRESSION / ASSESSMENT AND PLAN / ED COURSE  Pertinent labs & imaging results that were available during my care of the patient were reviewed by me and considered in my medical decision making (see chart for details).  The patient had a fall on outstretched hand with a nondisplaced distal radius fracture. Does not require reduction. She is placed in a short arm volar splint and is neurovascularly intact following. She has artery seen Dr. Martha Clan in the past her previous  orthopedic injury and would like to follow up with him again. GERD has pain medication at home. She is discharged home in improved condition.      ____________________________________________   FINAL CLINICAL IMPRESSION(S) / ED DIAGNOSES  Final diagnoses:  Closed fracture of left wrist, initial encounter      NEW MEDICATIONS STARTED DURING THIS VISIT:  Discharge Medication List as of 01/31/2017  8:42 PM       Note:  This document was prepared using Dragon voice recognition software and may include unintentional dictation errors.      Merrily Brittle, MD 01/31/17 2308

## 2017-01-31 NOTE — Discharge Instructions (Signed)
Please wear your splint at all times until you are evaluated by orthopedic surgery. Please make an appointment to follow-up in a week or so for a recheck. Return to the emergency department sooner for any new or worsening symptoms such as worsening pain, numbness, weakness, or for any other issues whatsoever.  It was a pleasure to take care of you today, and thank you for coming to our emergency department.  If you have any questions or concerns before leaving please ask the nurse to grab me and I'm more than happy to go through your aftercare instructions again.  If you were prescribed any opioid pain medication today such as Norco, Vicodin, Percocet, morphine, hydrocodone, or oxycodone please make sure you do not drive when you are taking this medication as it can alter your ability to drive safely.  If you have any concerns once you are home that you are not improving or are in fact getting worse before you can make it to your follow-up appointment, please do not hesitate to call 911 and come back for further evaluation.  Merrily Brittle, MD  Results for orders placed or performed in visit on 01/26/17  Microscopic Examination  Result Value Ref Range   WBC, UA 0-5 0 - 5 /hpf   RBC, UA >30 (H) 0 - 2 /hpf   Epithelial Cells (non renal) 0-10 0 - 10 /hpf   Mucus, UA Present (A) Not Estab.   Bacteria, UA Few (A) None seen/Few  Urinalysis, Complete  Result Value Ref Range   Specific Gravity, UA 1.025 1.005 - 1.030   pH, UA 5.0 5.0 - 7.5   Color, UA Yellow Yellow   Appearance Ur Cloudy (A) Clear   Leukocytes, UA Trace (A) Negative   Protein, UA 3+ (A) Negative/Trace   Glucose, UA Negative Negative   Ketones, UA Trace (A) Negative   RBC, UA 3+ (A) Negative   Bilirubin, UA Negative Negative   Urobilinogen, Ur 0.2 0.2 - 1.0 mg/dL   Nitrite, UA Negative Negative   Microscopic Examination See below:    Dg Chest 2 View  Result Date: 01/05/2017 CLINICAL DATA:  51 year old female with a history  of chest pain EXAM: CHEST  2 VIEW COMPARISON:  10/17/2016, 08/30/2014 FINDINGS: Cardiomediastinal silhouette within normal limits. No evidence of central vascular congestion. No pneumothorax or pleural effusion. No confluent airspace disease. IMPRESSION: No radiographic evidence of acute cardiopulmonary disease Electronically Signed   By: Gilmer Mor D.O.   On: 01/05/2017 12:59   Dg Forearm Left  Result Date: 01/31/2017 CLINICAL DATA:  Left forearm injury due to a trip and fall today. Initial encounter. EXAM: LEFT FOREARM - 2 VIEW COMPARISON:  None. FINDINGS: There is no evidence of fracture or other focal bone lesions. Soft tissues are unremarkable. IMPRESSION: Negative exam. Electronically Signed   By: Drusilla Kanner M.D.   On: 01/31/2017 19:54   Dg Wrist Complete Left  Result Date: 01/31/2017 CLINICAL DATA:  Left wrist pain due to a trip and fall today. Initial encounter. EXAM: LEFT WRIST - COMPLETE 3+ VIEW COMPARISON:  Plain films of the left wrist 09/01/2014. FINDINGS: On the lateral view, there is a subtle cortical step-off along the dorsal margin of the distal metaphysis of the radius compatible with a nondisplaced and incomplete fracture. No other fracture is identified. Accessory ossicle off the tip of the ulnar styloid is unchanged. IMPRESSION: Nondisplaced and incomplete fracture through the dorsal cortex of the distal radius. Electronically Signed   By: Maisie Fus  Dalessio M.D.   On: 01/31/2017 20:22

## 2017-01-31 NOTE — ED Triage Notes (Signed)
Pt to ED reporting having tripped over toy and falling onto left wrist. Deformity noted to left wrist. Movement and sensation intact. Small amount of swelling. No discoloration. Pulse present and strong bilaterally. Pt denies having hit head. No LOC.

## 2017-03-19 ENCOUNTER — Encounter: Payer: Self-pay | Admitting: Physical Therapy

## 2017-03-19 ENCOUNTER — Ambulatory Visit: Payer: BC Managed Care – PPO | Attending: Orthopedic Surgery | Admitting: Physical Therapy

## 2017-03-19 DIAGNOSIS — M6281 Muscle weakness (generalized): Secondary | ICD-10-CM | POA: Insufficient documentation

## 2017-03-19 DIAGNOSIS — M25632 Stiffness of left wrist, not elsewhere classified: Secondary | ICD-10-CM | POA: Diagnosis present

## 2017-03-19 DIAGNOSIS — M25532 Pain in left wrist: Secondary | ICD-10-CM | POA: Insufficient documentation

## 2017-03-19 DIAGNOSIS — M25562 Pain in left knee: Secondary | ICD-10-CM | POA: Insufficient documentation

## 2017-03-19 NOTE — Therapy (Signed)
[place] [facility] [address] [place], [place], [address] Phone: [phone]   Fax:  [phone]  Physical Therapy Evaluation  Patient Details  Name: Mariah Reeves MRN: [id number] Date of Birth: [birth year] Referring Provider: Dr. Martha Clan  Encounter Date: 03/19/2017      PT End of Session - 03/19/17 2043    Visit Number 1   Number of Visits 8   Date for PT Re-Evaluation 04/16/17   Authorization Type no g codes   PT Start Time 1249   PT Stop Time 1351   PT Time Calculation (min) 62 min   Activity Tolerance Patient tolerated treatment well   Behavior During Therapy [facility] for tasks assessed/performed      Past Medical History:  Diagnosis Date  . Allergy   . Anomaly, uterus    tumor of uterus  . Anxiety   . Bilateral occipital neuralgia   . Bipolar 1 disorder (HCC)   . Chronic back pain   . Degenerative disc disease, lumbar   . Depression   . Facet syndrome, lumbar   . Headache    Migraines  . History of kidney stones    Left  . Hx of vertigo   . Nephrolithiasis 01/26/2017  . Patella fracture    Left  . Sleep apnea    C-PAP    Past Surgical History:  Procedure Laterality Date  . ABDOMINOPLASTY  2006  . CARPAL TUNNEL RELEASE Left   . CHOLECYSTECTOMY    . CYSTOSCOPY W/ URETERAL STENT PLACEMENT  01/03/2017   Procedure: CYSTOSCOPY WITH RETROGRADE PYELOGRAM/URETERAL STENT PLACEMENT;  Surgeon: Hildred Laser, MD;  Location: [facility] ORS;  Service: Urology;;  . Bluford Kaufmann W/ URETERAL STENT PLACEMENT Left 01/17/2017   Procedure: CYSTOSCOPY WITH STENT REPLACEMENT;  Surgeon: Hildred Laser, MD;  Location: [facility] ORS;  Service: Urology;  Laterality: Left;  . DILATATION & CURETTAGE/HYSTEROSCOPY WITH MYOSURE N/A 07/08/2015   Procedure: DILATATION & CURETTAGE/HYSTEROSCOPY WITH MYOSURE;  Surgeon: Nadara Mustard, MD;  Location: [facility] ORS;  Service: Gynecology;  Laterality: N/A;  . DILATION AND CURETTAGE OF UTERUS     1/17  .  EXTRACORPOREAL SHOCK WAVE LITHOTRIPSY Left 12/07/2016   Procedure: EXTRACORPOREAL SHOCK WAVE LITHOTRIPSY (ESWL);  Surgeon: Hildred Laser, MD;  Location: [facility] ORS;  Service: Urology;  Laterality: Left;  Marland Kitchen GASTRIC BYPASS    . KNEE SURGERY    . LITHOTRIPSY    . MIDDLE EAR SURGERY    . NASAL SEPTUM SURGERY    . URETEROSCOPY WITH HOLMIUM LASER LITHOTRIPSY     perforated ureter. had nephrostomy tube for brief time  . URETEROSCOPY WITH HOLMIUM LASER LITHOTRIPSY Left 01/17/2017   Procedure: URETEROSCOPY WITH HOLMIUM LASER LITHOTRIPSY;  Surgeon: Hildred Laser, MD;  Location: [facility] ORS;  Service: Urology;  Laterality: Left;    There were no vitals filed for this visit.       Subjective Assessment - 03/19/17 1257    Subjective Pt. fell on 8/22 resulting in closed L colles fracture.  Pt. reports 2/10 L wrist pain currentlyat rest with use of soft wrist splint.      Pertinent History Pt. known well to PT clinic.  See medical records.     Patient Stated Goals Increase L wrist ROM/ strength/ pain mgmt.     Currently in Pain? Yes   Pain Score 2    Pain Location Wrist   Pain Orientation Left   Pain Descriptors / Indicators Aching   Pain Type Acute  pain          [facility] PT Assessment - 03/19/17 0001      Assessment   Medical Diagnosis L closed colles fracture   Referring Provider Dr. Martha Clan   Onset Date/Surgical Date 01/31/17   Hand Dominance Right   Next MD Visit 04/04/17   Prior Therapy yes for knee     Balance Screen   Has the patient fallen in the past 6 months Yes   How many times? 2     Home Environment   Living Environment Private residence   Living Arrangements Alone   Type of Home Mobile home      Pt. Fell on 01/31/17  Pt. Reports MD wants pt. To limit wt. Bearing on L UE.  Returns to MD on 04/04/17 (Wednesday).    QuickDASH: 27.3%  B shoulder AROM WFL (all planes). Grip strength: R 41#, L 24#  R 68 deg. Ext./ 42 deg. Flexion/ R sup./pron. WNL. L 48 deg.  Ext/ 25 deg. Flexion/ R sup. 62 deg./ pron. WNL.    There.ex.: See HEP.    Objective measurements completed on examination: See above findings.          PT Education - 03/19/17 2043    Education provided Yes   Education Details See HEP/ issued green putty   Person(s) Educated Patient   Methods Explanation;Demonstration;Handout   Comprehension Verbalized understanding;Returned demonstration             PT Long Term Goals - 03/19/17 2105      PT LONG TERM GOAL #1   Title Pt. independent with HEP to increase L wrist AROM to WNL as compared to R wrist to improve mobility.    Baseline Decrease (R/L) wrist AROM: flexion 68/48 deg., flexion 42/25 deg., supination WNL/ 62 deg., pronation WNL.  Good sensation/ finger AROM.   Time 4   Period Weeks   Status New   Target Date 04/16/17     PT LONG TERM GOAL #2   Title Pt. will increase L grip strength to >32# to improve grasping/ carrying tasks without pain.     Baseline Grip strength: R 41#/ L 24#.     Time 4   Period Weeks   Status New   Target Date 04/16/17     PT LONG TERM GOAL #3   Title Pt. will decrease QuickDASH to <18% to improve UE pain-free mobility.    Baseline QuickDASH on 10/8:  27.3%   Time 4   Period Weeks   Status New   Target Date 04/16/17                Plan - 03/19/17 2045    Clinical Impression Statement Pt. is a pleasant 51 y/o female s/p fall on 01/31/17 resulting in L closed colles fracture.  Pt. reports 2/10 L wrist pain currently at rest.  Pt. using soft wrist splint during the day.  Pt. presents with no L wrist swelling or tenderness but muscle atrophy in L forearm noted.  B shoulder AROM WFL and strength grossly 4/5 MMT.  Grip strength: R 41#/ L 24#.  Decrease (R/L) wrist AROM: flexion 68/48 deg., flexion 42/25 deg., supination WNL/ 62 deg., pronation WNL.  Good sensation/ finger AROM.  Pt. will benefit from skilled PT services to increase L wrist AROM/ strength to improve return to prior  level of function with daily tasks.     Clinical Presentation Stable   Clinical Decision Making Low   Rehab Potential  Good   PT Frequency 2x / week   PT Duration 4 weeks   PT Treatment/Interventions ADLs/Self Care Home Management;Moist Heat;Iontophoresis /ml Dexamethasone;Cryotherapy;Therapeutic exercise;Therapeutic activities;Functional mobility training;Neuromuscular re-education;Patient/family education;Manual techniques;Passive range of motion   PT Next Visit Plan Increase L wrist AROM/ strengthening to improve pain-free mobility with ADL.    PT Home Exercise Plan See HEP.     Consulted and Agree with Plan of Care Patient      Patient will benefit from skilled therapeutic intervention in order to improve the following deficits and impairments:  Hypomobility, Decreased range of motion, Decreased strength, Impaired flexibility, Pain  Visit Diagnosis: Pain in left wrist  Muscle weakness (generalized)  Stiffness of left wrist joint     Problem List Patient Active Problem List   Diagnosis Date Noted  . Nephrolithiasis 01/26/2017  . Left ureteral stone 01/03/2017  . Endometrial disorder 07/08/2015  . Bilateral occipital neuralgia 03/02/2015  . DDD (degenerative disc disease), lumbar 12/24/2014  . Facet syndrome, lumbar 12/24/2014  . Sacroiliac joint dysfunction 12/24/2014   Cammie Mcgee, PT, DPT # [id number] 03/19/2017, 9:09 PM  [facility] [address] [place], [place], [address] Phone: [phone]   Fax:  [phone]  Name: Mariah Reeves MRN: [id number] Date of Birth: 19-Aug-1965

## 2017-03-22 ENCOUNTER — Encounter: Payer: Self-pay | Admitting: Physical Therapy

## 2017-03-22 ENCOUNTER — Ambulatory Visit: Payer: BC Managed Care – PPO | Admitting: Physical Therapy

## 2017-03-22 DIAGNOSIS — M6281 Muscle weakness (generalized): Secondary | ICD-10-CM

## 2017-03-22 DIAGNOSIS — M25632 Stiffness of left wrist, not elsewhere classified: Secondary | ICD-10-CM

## 2017-03-22 DIAGNOSIS — M25532 Pain in left wrist: Secondary | ICD-10-CM

## 2017-03-22 NOTE — Therapy (Signed)
[facility] [address] [place], [place], [address] Phone: [phone]   Fax:  [phone]  Physical Therapy Treatment  Patient Details  Name: Mariah Reeves MRN: [id number] Date of Birth: March 28, 1966 Referring Provider: Dr. Martha Clan  Encounter Date: 03/22/2017      PT End of Session - 03/22/17 1243    Visit Number 2   Number of Visits 8   Date for PT Re-Evaluation 04/16/17   Authorization Type no g codes   PT Start Time 1007   PT Stop Time 1051   PT Time Calculation (min) 44 min   Activity Tolerance Patient tolerated treatment well;No increased pain   Behavior During Therapy WFL for tasks assessed/performed      Past Medical History:  Diagnosis Date  . Allergy   . Anomaly, uterus    tumor of uterus  . Anxiety   . Bilateral occipital neuralgia   . Bipolar 1 disorder (HCC)   . Chronic back pain   . Degenerative disc disease, lumbar   . Depression   . Facet syndrome, lumbar   . Headache    Migraines  . History of kidney stones    Left  . Hx of vertigo   . Nephrolithiasis 01/26/2017  . Patella fracture    Left  . Sleep apnea    C-PAP    Past Surgical History:  Procedure Laterality Date  . ABDOMINOPLASTY  2006  . CARPAL TUNNEL RELEASE Left   . CHOLECYSTECTOMY    . CYSTOSCOPY W/ URETERAL STENT PLACEMENT  01/03/2017   Procedure: CYSTOSCOPY WITH RETROGRADE PYELOGRAM/URETERAL STENT PLACEMENT;  Surgeon: Hildred Laser, MD;  Location: [facility];  Service: Urology;;  . Bluford Kaufmann W/ URETERAL STENT PLACEMENT Left 01/17/2017   Procedure: CYSTOSCOPY WITH STENT REPLACEMENT;  Surgeon: Hildred Laser, MD;  Location: [facility];  Service: Urology;  Laterality: Left;  . DILATATION & CURETTAGE/HYSTEROSCOPY WITH MYOSURE N/A 07/08/2015   Procedure: DILATATION & CURETTAGE/HYSTEROSCOPY WITH MYOSURE;  Surgeon: Nadara Mustard, MD;  Location: [facility];  Service: Gynecology;  Laterality: N/A;  . DILATION AND CURETTAGE OF UTERUS      1/17  . EXTRACORPOREAL SHOCK WAVE LITHOTRIPSY Left 12/07/2016   Procedure: EXTRACORPOREAL SHOCK WAVE LITHOTRIPSY (ESWL);  Surgeon: Hildred Laser, MD;  Location: [facility];  Service: Urology;  Laterality: Left;  Marland Kitchen GASTRIC BYPASS    . KNEE SURGERY    . LITHOTRIPSY    . MIDDLE EAR SURGERY    . NASAL SEPTUM SURGERY    . URETEROSCOPY WITH HOLMIUM LASER LITHOTRIPSY     perforated ureter. had nephrostomy tube for brief time  . URETEROSCOPY WITH HOLMIUM LASER LITHOTRIPSY Left 01/17/2017   Procedure: URETEROSCOPY WITH HOLMIUM LASER LITHOTRIPSY;  Surgeon: Hildred Laser, MD;  Location: [facility];  Service: Urology;  Laterality: Left;    There were no vitals filed for this visit.      Subjective Assessment - 03/22/17 1240    Subjective Pt reports she is doing well. No pain at rest at start of session. Pt reports she had some stiffness and soreness following last session, but symptoms resolved within a day.   Pertinent History Pt. known well to PT clinic.  See medical records.     Patient Stated Goals Increase L wrist ROM/ strength/ pain mgmt.     Currently in Pain? No/denies   Multiple Pain Sites No        Treatment:  Warm-up arm bike x 5 min with BUE (un-billed)  no increased pain  Ther-ex:   L wrist AROM in flexion, ext, supination, pronation, and radial/ulnar deviation with 5 sec hold 2 x 10 reps; pt reported no increase in pain; cues to avoid compensation with elbow and shoulder  Reviewed HEP.   Manual therapy:   L wrist isometric strengthening in neutral position for flexion, ext, supination, pronation, and radial/ulnar deviation with 5 sec hold 2 x 10 reps with manual resistance   Discussed HEP with handout given for AROM exercise; will progress to isometrics for HEP next week.   Pt with no increased L wrist pain       PT Education - 03/22/17 1242    Education provided Yes   Education Details HEP advance, ther-ex, isometrics, L wrist AROM   Person(s) Educated  Patient   Methods Explanation;Demonstration;Tactile cues;Verbal cues;Handout   Comprehension Tactile cues required;Verbal cues required;Returned demonstration;Verbalized understanding             PT Long Term Goals - 03/19/17 2105      PT LONG TERM GOAL #1   Title Pt. independent with HEP to increase L wrist AROM to WNL as compared to R wrist to improve mobility.    Baseline Decrease (R/L) wrist AROM: flexion 68/48 deg., flexion 42/25 deg., supination WNL/ 62 deg., pronation WNL.  Good sensation/ finger AROM.   Time 4   Period Weeks   Status New   Target Date 04/16/17     PT LONG TERM GOAL #2   Title Pt. will increase L grip strength to >32# to improve grasping/ carrying tasks without pain.     Baseline Grip strength: R 41#/ L 24#.     Time 4   Period Weeks   Status New   Target Date 04/16/17     PT LONG TERM GOAL #3   Title Pt. will decrease QuickDASH to <18% to improve UE pain-free mobility.    Baseline QuickDASH on 10/8:  27.3%   Time 4   Period Weeks   Status New   Target Date 04/16/17               Plan - 03/22/17 1248    Clinical Impression Statement Pt presents to therapy without wrist brace today. She has not been wearing brace often at home. Pt had no increased pain with L wrist AROM and isometric ther-ex, though moderate fatigue was noted. Pt has discrepancy of grip strength with yellow bulb hand barometer with 3PSI on R and 1.5PSI on L with sustained hold. Pt will continue isometric strengthening to increase AROM and hand function    Clinical Presentation Stable   Clinical Decision Making Low   Rehab Potential Good   PT Frequency 2x / week   PT Duration 4 weeks   PT Treatment/Interventions ADLs/Self Care Home Management;Moist Heat;Iontophoresis /ml Dexamethasone;Cryotherapy;Therapeutic exercise;Therapeutic activities;Functional mobility training;Neuromuscular re-education;Patient/family education;Manual techniques;Passive range of motion   PT Next  Visit Plan Increase L wrist AROM/ strengthening; advance HEP with ismetrics and consider light band resisted AROM   PT Home Exercise Plan L wrist AROM, putty squeeze    Consulted and Agree with Plan of Care Patient      Patient will benefit from skilled therapeutic intervention in order to improve the following deficits and impairments:  Hypomobility, Decreased range of motion, Decreased strength, Impaired flexibility, Pain  Visit Diagnosis: Pain in left wrist  Muscle weakness (generalized)  Stiffness of left wrist joint     Problem List Patient Active Problem List   Diagnosis Date  Noted  . Nephrolithiasis 01/26/2017  . Left ureteral stone 01/03/2017  . Endometrial disorder 07/08/2015  . Bilateral occipital neuralgia 03/02/2015  . DDD (degenerative disc disease), lumbar 12/24/2014  . Facet syndrome, lumbar 12/24/2014  . Sacroiliac joint dysfunction 12/24/2014     William Laske Clydene Laming, SPT Cammie Mcgee, PT, DPT # [id number] 03/22/2017, 3:24 PM  [facility] [address] [place], [place], [address] Phone: [phone]   Fax:  [phone]  Name: Mariah Reeves MRN: [id number] Date of Birth: 06-23-1965

## 2017-03-26 ENCOUNTER — Encounter: Payer: BC Managed Care – PPO | Admitting: Physical Therapy

## 2017-03-30 ENCOUNTER — Encounter: Payer: Self-pay | Admitting: Physical Therapy

## 2017-03-30 ENCOUNTER — Encounter: Payer: BC Managed Care – PPO | Admitting: Physical Therapy

## 2017-03-30 ENCOUNTER — Ambulatory Visit: Payer: BC Managed Care – PPO | Admitting: Physical Therapy

## 2017-03-30 DIAGNOSIS — M25532 Pain in left wrist: Secondary | ICD-10-CM | POA: Diagnosis not present

## 2017-03-30 DIAGNOSIS — M6281 Muscle weakness (generalized): Secondary | ICD-10-CM

## 2017-03-30 NOTE — Therapy (Signed)
[facility] [address] [place], [place], [address] Phone: [phone]   Fax:  [phone]  Physical Therapy Treatment  Patient Details  Name: Mariah Reeves MRN: [id number] Date of Birth: Nov 17, 1965 Referring Provider: Dr. Martha ClanKrasinski  Encounter Date: 03/30/2017      PT End of Session - 03/30/17 1726    Visit Number 3   Number of Visits 8   Date for PT Re-Evaluation 04/16/17   Authorization Type no g codes   PT Start Time 1514   PT Stop Time 1558   PT Time Calculation (min) 44 min   Activity Tolerance Patient tolerated treatment well;No increased pain   Behavior During Therapy WFL for tasks assessed/performed      Past Medical History:  Diagnosis Date  . Allergy   . Anomaly, uterus    tumor of uterus  . Anxiety   . Bilateral occipital neuralgia   . Bipolar 1 disorder (HCC)   . Chronic back pain   . Degenerative disc disease, lumbar   . Depression   . Facet syndrome, lumbar   . Headache    Migraines  . History of kidney stones    Left  . Hx of vertigo   . Nephrolithiasis 01/26/2017  . Patella fracture    Left  . Sleep apnea    C-PAP    Past Surgical History:  Procedure Laterality Date  . ABDOMINOPLASTY  2006  . CARPAL TUNNEL RELEASE Left   . CHOLECYSTECTOMY    . CYSTOSCOPY W/ URETERAL STENT PLACEMENT  01/03/2017   Procedure: CYSTOSCOPY WITH RETROGRADE PYELOGRAM/URETERAL STENT PLACEMENT;  Surgeon: Hildred LaserBudzyn, Brian James, MD;  Location: [facility];  Service: Urology;;  . Bluford KaufmannYSTOSCOPY W/ URETERAL STENT PLACEMENT Left 01/17/2017   Procedure: CYSTOSCOPY WITH STENT REPLACEMENT;  Surgeon: Hildred LaserBudzyn, Brian James, MD;  Location: [facility];  Service: Urology;  Laterality: Left;  . DILATATION & CURETTAGE/HYSTEROSCOPY WITH MYOSURE N/A 07/08/2015   Procedure: DILATATION & CURETTAGE/HYSTEROSCOPY WITH MYOSURE;  Surgeon: Nadara Mustardobert P Harris, MD;  Location: [facility];  Service: Gynecology;  Laterality: N/A;  . DILATION AND CURETTAGE OF UTERUS      1/17  . EXTRACORPOREAL SHOCK WAVE LITHOTRIPSY Left 12/07/2016   Procedure: EXTRACORPOREAL SHOCK WAVE LITHOTRIPSY (ESWL);  Surgeon: Hildred LaserBudzyn, Brian James, MD;  Location: [facility];  Service: Urology;  Laterality: Left;  Marland Kitchen. GASTRIC BYPASS    . KNEE SURGERY    . LITHOTRIPSY    . MIDDLE EAR SURGERY    . NASAL SEPTUM SURGERY    . URETEROSCOPY WITH HOLMIUM LASER LITHOTRIPSY     perforated ureter. had nephrostomy tube for brief time  . URETEROSCOPY WITH HOLMIUM LASER LITHOTRIPSY Left 01/17/2017   Procedure: URETEROSCOPY WITH HOLMIUM LASER LITHOTRIPSY;  Surgeon: Hildred LaserBudzyn, Brian James, MD;  Location: [facility];  Service: Urology;  Laterality: Left;    There were no vitals filed for this visit.      Subjective Assessment - 03/30/17 1510    Subjective Pt reports soreness after carrying mail yesterday; soreness has persisted today.    Pertinent History Pt. known well to PT clinic.  See medical records.     Patient Stated Goals Increase L wrist ROM/ strength/ pain mgmt.     Currently in Pain? Yes   Pain Score 4    Pain Location Wrist   Pain Orientation Left   Pain Descriptors / Indicators Aching   Pain Type Acute pain   Pain Onset 1 to 4 weeks ago   Aggravating Factors  Nothing stated        Treatment:   Ther-ex:     Sitting:   Eccentric L wrist extension with yellow t-band x 15 reps with 3 sec hold  T-band shoulder external rotation with isometric wrist extension 2 x 10; instructed and added to HEP  HEP advanced with yellow t-band resisted wrist motion in all planes in pain-free range; Pt able to perform with good technique; handout given   Manual therapy:  Isometric L wrist flexion/extension, radial/ulnar deviation, supination/pronation; each performed x 5 with 5 sec hold    Mobs grade II-III to proximal row of carpals on radius/ulna AP 2 x 30-45 sec bout and PA 2 x 30-45 sec bouts; Pt had mild pain that was alleviated with lighter grade mob/change of hand position        PT  Education - 03/30/17 1725    Education provided Yes   Education Details HEP advanced   Person(s) Educated Patient   Methods Explanation;Demonstration;Tactile cues;Handout;Verbal cues   Comprehension Tactile cues required;Verbal cues required;Returned demonstration;Verbalized understanding             PT Long Term Goals - 03/19/17 2105      PT LONG TERM GOAL #1   Title Pt. independent with HEP to increase L wrist AROM to WNL as compared to R wrist to improve mobility.    Baseline Decrease (R/L) wrist AROM: flexion 68/48 deg., flexion 42/25 deg., supination WNL/ 62 deg., pronation WNL.  Good sensation/ finger AROM.   Time 4   Period Weeks   Status New   Target Date 04/16/17     PT LONG TERM GOAL #2   Title Pt. will increase L grip strength to >32# to improve grasping/ carrying tasks without pain.     Baseline Grip strength: R 41#/ L 24#.     Time 4   Period Weeks   Status New   Target Date 04/16/17     PT LONG TERM GOAL #3   Title Pt. will decrease QuickDASH to <18% to improve UE pain-free mobility.    Baseline QuickDASH on 10/8:  27.3%   Time 4   Period Weeks   Status New   Target Date 04/16/17               Plan - 03/30/17 1728    Clinical Impression Statement Pt presents to therapy without wrist brace again. Pt has not been doing her exercises lately except for putty mobilization. Importance of HEP re-emphasized. Pt had little/no pain with resisted exercise for L wrist today. She had mild pain with carpal mobilization that was alleviated with lighter grade mob/change of hand position.   Clinical Presentation Stable   Clinical Decision Making Low   Rehab Potential Good   PT Frequency 2x / week   PT Duration 4 weeks   PT Treatment/Interventions ADLs/Self Care Home Management;Moist Heat;Iontophoresis 4mg /ml Dexamethasone;Cryotherapy;Therapeutic exercise;Therapeutic activities;Functional mobility training;Neuromuscular re-education;Patient/family education;Manual  techniques;Passive range of motion   PT Next Visit Plan Re-assess resisted wrist exercise, carpal on radius/ulna mobilization, begin elbow/ shoulder strengthening with isometric wrist   PT Home Exercise Plan Yellow t-band resisted exercise in all plance for L wrist, L wrist AROM, putty squeeze    Consulted and Agree with Plan of Care Patient      Patient will benefit from skilled therapeutic intervention in order to improve the following deficits and impairments:  Hypomobility, Decreased range of motion, Decreased strength, Impaired flexibility, Pain  Visit Diagnosis: Pain in left wrist  Muscle weakness (generalized)     Problem List Patient Active Problem List   Diagnosis Date Noted  . Nephrolithiasis 01/26/2017  . Left ureteral stone 01/03/2017  . Endometrial disorder 07/08/2015  . Bilateral occipital neuralgia 03/02/2015  . DDD (degenerative disc disease), lumbar 12/24/2014  . Facet syndrome, lumbar 12/24/2014  . Sacroiliac joint dysfunction 12/24/2014    Arlesia Kiel Clydene Laming, SPT Cammie Mcgee, PT, DPT # [phone] 03/30/2017, 5:35 PM  [facility] [address] [place], [place], [address] Phone: [phone]   Fax:  [phone]  Name: Mariah Reeves MRN: [id number] Date of Birth: [birth year]

## 2017-04-02 ENCOUNTER — Ambulatory Visit: Payer: BC Managed Care – PPO | Admitting: Physical Therapy

## 2017-04-02 ENCOUNTER — Encounter: Payer: Self-pay | Admitting: Physical Therapy

## 2017-04-02 DIAGNOSIS — M25632 Stiffness of left wrist, not elsewhere classified: Secondary | ICD-10-CM

## 2017-04-02 DIAGNOSIS — M25532 Pain in left wrist: Secondary | ICD-10-CM | POA: Diagnosis not present

## 2017-04-02 DIAGNOSIS — M6281 Muscle weakness (generalized): Secondary | ICD-10-CM

## 2017-04-02 NOTE — Therapy (Signed)
[facility] [address] [place], [place], [address] Phone: [phone]   Fax:  [phone]  Physical Therapy Treatment  Patient Details  Name: Mariah Reeves MRN: [id number] Date of Birth: June 01, 1966 Referring Provider: Dr. Martha Clan  Encounter Date: 04/02/2017      PT End of Session - 04/03/17 1038    Visit Number 4   Number of Visits 8   Date for PT Re-Evaluation 04/16/17   Authorization Type no g codes   PT Start Time 1444   PT Stop Time 1535   PT Time Calculation (min) 51 min   Activity Tolerance Patient tolerated treatment well;No increased pain   Behavior During Therapy WFL for tasks assessed/performed      Past Medical History:  Diagnosis Date  . Allergy   . Anomaly, uterus    tumor of uterus  . Anxiety   . Bilateral occipital neuralgia   . Bipolar 1 disorder (HCC)   . Chronic back pain   . Degenerative disc disease, lumbar   . Depression   . Facet syndrome, lumbar   . Headache    Migraines  . History of kidney stones    Left  . Hx of vertigo   . Nephrolithiasis 01/26/2017  . Patella fracture    Left  . Sleep apnea    C-PAP    Past Surgical History:  Procedure Laterality Date  . ABDOMINOPLASTY  2006  . CARPAL TUNNEL RELEASE Left   . CHOLECYSTECTOMY    . CYSTOSCOPY W/ URETERAL STENT PLACEMENT  01/03/2017   Procedure: CYSTOSCOPY WITH RETROGRADE PYELOGRAM/URETERAL STENT PLACEMENT;  Surgeon: Hildred Laser, MD;  Location: [facility];  Service: Urology;;  . Bluford Kaufmann W/ URETERAL STENT PLACEMENT Left 01/17/2017   Procedure: CYSTOSCOPY WITH STENT REPLACEMENT;  Surgeon: Hildred Laser, MD;  Location: [facility];  Service: Urology;  Laterality: Left;  . DILATATION & CURETTAGE/HYSTEROSCOPY WITH MYOSURE N/A 07/08/2015   Procedure: DILATATION & CURETTAGE/HYSTEROSCOPY WITH MYOSURE;  Surgeon: Nadara Mustard, MD;  Location: [facility];  Service: Gynecology;  Laterality: N/A;  . DILATION AND CURETTAGE OF UTERUS      1/17  . EXTRACORPOREAL SHOCK WAVE LITHOTRIPSY Left 12/07/2016   Procedure: EXTRACORPOREAL SHOCK WAVE LITHOTRIPSY (ESWL);  Surgeon: Hildred Laser, MD;  Location: [facility];  Service: Urology;  Laterality: Left;  Marland Kitchen GASTRIC BYPASS    . KNEE SURGERY    . LITHOTRIPSY    . MIDDLE EAR SURGERY    . NASAL SEPTUM SURGERY    . URETEROSCOPY WITH HOLMIUM LASER LITHOTRIPSY     perforated ureter. had nephrostomy tube for brief time  . URETEROSCOPY WITH HOLMIUM LASER LITHOTRIPSY Left 01/17/2017   Procedure: URETEROSCOPY WITH HOLMIUM LASER LITHOTRIPSY;  Surgeon: Hildred Laser, MD;  Location: [facility];  Service: Urology;  Laterality: Left;    There were no vitals filed for this visit.      Subjective Assessment - 04/02/17 1441    Subjective Pt. reports compliance with HEP and returning to gym with friend.  No new complaints.     Pertinent History Pt. known well to PT clinic.  See medical records.     Patient Stated Goals Increase L wrist ROM/ strength/ pain mgmt.     Currently in Pain? Yes   Pain Score 3    Pain Location Wrist   Pain Orientation Left   Pain Descriptors / Indicators Aching   Pain Type Acute pain      Grip: L 30# and  R 46#     Treatment:  Ther-ex: Sitting:  B UBE 2 min. F/b.  (warm-up/no charge).               Reviewed eccentric L wrist extension with yellow t-band x 10 reps with 3 sec hold              Standing shoulder ex.:  YTB Bicep curls/ sh. Flexion/ external rotation/ scap.                             Retraction/ tricep ext. with neutral wrist 20x.  (mirror feedback/ verbal cuing).    Hammer sup/pron. 10x in sitting posture with elbow on pillow.   Yellow bulb (2-3#) with holds 10x.   Manual therapy:             L wrist AAROM: flexion/extension, radial/ulnar deviation, supination/pronation; each  performed x 5 with 5 sec hold  No mobs today.     Ice to L wrist after tx. In supine position with L forearm supported by pillow.         PT Long  Term Goals - 03/19/17 2105      PT LONG TERM GOAL #1   Title Pt. independent with HEP to increase L wrist AROM to WNL as compared to R wrist to improve mobility.    Baseline Decrease (R/L) wrist AROM: flexion 68/48 deg., flexion 42/25 deg., supination WNL/ 62 deg., pronation WNL.  Good sensation/ finger AROM.   Time 4   Period Weeks   Status New   Target Date 04/16/17     PT LONG TERM GOAL #2   Title Pt. will increase L grip strength to >32# to improve grasping/ carrying tasks without pain.     Baseline Grip strength: R 41#/ L 24#.     Time 4   Period Weeks   Status New   Target Date 04/16/17     PT LONG TERM GOAL #3   Title Pt. will decrease QuickDASH to <18% to improve UE pain-free mobility.    Baseline QuickDASH on 10/8:  27.3%   Time 4   Period Weeks   Status New   Target Date 04/16/17            Plan - 04/03/17 1039    Clinical Impression Statement Pt. progressing well with L wrist/ forearm AROM in a pain tolerable range and strengthening program.  Marked improvement in B grip strength as compared to initial evaluation.  No increase c/o pain after tx. session.  No change to HEP today but will increase resistance with strength training next tx.     Clinical Presentation Stable   Clinical Decision Making Low   PT Frequency 2x / week   PT Duration 4 weeks   PT Treatment/Interventions ADLs/Self Care Home Management;Moist Heat;Iontophoresis 4mg /ml Dexamethasone;Cryotherapy;Therapeutic exercise;Therapeutic activities;Functional mobility training;Neuromuscular re-education;Patient/family education;Manual techniques;Passive range of motion   PT Next Visit Plan Carpal on radius/ulna mobilization, progress L wrist/elbow strengthening (RTB).     PT Home Exercise Plan Yellow t-band resisted exercise in all plance for L wrist, L wrist AROM, putty squeeze    Consulted and Agree with Plan of Care Patient      Patient will benefit from skilled therapeutic intervention in order to  improve the following deficits and impairments:  Hypomobility, Decreased range of motion, Decreased strength, Impaired flexibility, Pain  Visit Diagnosis: Pain in left wrist  Muscle weakness (generalized)  Stiffness of  left wrist joint     Problem List Patient Active Problem List   Diagnosis Date Noted  . Nephrolithiasis 01/26/2017  . Left ureteral stone 01/03/2017  . Endometrial disorder 07/08/2015  . Bilateral occipital neuralgia 03/02/2015  . DDD (degenerative disc disease), lumbar 12/24/2014  . Facet syndrome, lumbar 12/24/2014  . Sacroiliac joint dysfunction 12/24/2014   Cammie Mcgee, PT, DPT # [phone] 04/03/2017, 2:59 PM  [facility] [address] [place], [place], [address] Phone: [phone]   Fax:  [phone]  Name: Mariah Reeves MRN: [id number] Date of Birth: July 08, 1965

## 2017-04-04 ENCOUNTER — Encounter: Payer: Self-pay | Admitting: Physical Therapy

## 2017-04-04 ENCOUNTER — Ambulatory Visit: Payer: BC Managed Care – PPO | Admitting: Physical Therapy

## 2017-04-04 DIAGNOSIS — M25532 Pain in left wrist: Secondary | ICD-10-CM | POA: Diagnosis not present

## 2017-04-04 DIAGNOSIS — M25632 Stiffness of left wrist, not elsewhere classified: Secondary | ICD-10-CM

## 2017-04-04 DIAGNOSIS — M6281 Muscle weakness (generalized): Secondary | ICD-10-CM

## 2017-04-04 NOTE — Therapy (Signed)
[place] [facility] [address]. [place], [place], [address] Phone: [phone]   Fax:  [phone]  Physical Therapy Treatment  Patient Details  Name: Mariah Reeves MRN: [id number] Date of Birth: 07-25-1965 Referring Provider: Dr. Martha Clan  Encounter Date: 04/04/2017      PT End of Session - 04/04/17 1445    Visit Number 5   Number of Visits 8   Date for PT Re-Evaluation 04/16/17   Authorization Type no g codes   PT Start Time 1442   PT Stop Time 1534   PT Time Calculation (min) 52 min   Activity Tolerance Patient tolerated treatment well;No increased pain   Behavior During Therapy WFL for tasks assessed/performed      Past Medical History:  Diagnosis Date  . Allergy   . Anomaly, uterus    tumor of uterus  . Anxiety   . Bilateral occipital neuralgia   . Bipolar 1 disorder (HCC)   . Chronic back pain   . Degenerative disc disease, lumbar   . Depression   . Facet syndrome, lumbar   . Headache    Migraines  . History of kidney stones    Left  . Hx of vertigo   . Nephrolithiasis 01/26/2017  . Patella fracture    Left  . Sleep apnea    C-PAP    Past Surgical History:  Procedure Laterality Date  . ABDOMINOPLASTY  2006  . CARPAL TUNNEL RELEASE Left   . CHOLECYSTECTOMY    . CYSTOSCOPY W/ URETERAL STENT PLACEMENT  01/03/2017   Procedure: CYSTOSCOPY WITH RETROGRADE PYELOGRAM/URETERAL STENT PLACEMENT;  Surgeon: Hildred Laser, MD;  Location: [facility];  Service: Urology;;  . Bluford Kaufmann W/ URETERAL STENT PLACEMENT Left 01/17/2017   Procedure: CYSTOSCOPY WITH STENT REPLACEMENT;  Surgeon: Hildred Laser, MD;  Location: [facility];  Service: Urology;  Laterality: Left;  . DILATATION & CURETTAGE/HYSTEROSCOPY WITH MYOSURE N/A 07/08/2015   Procedure: DILATATION & CURETTAGE/HYSTEROSCOPY WITH MYOSURE;  Surgeon: Nadara Mustard, MD;  Location: [facility];  Service: Gynecology;  Laterality: N/A;  . DILATION AND CURETTAGE OF UTERUS      1/17  . EXTRACORPOREAL SHOCK WAVE LITHOTRIPSY Left 12/07/2016   Procedure: EXTRACORPOREAL SHOCK WAVE LITHOTRIPSY (ESWL);  Surgeon: Hildred Laser, MD;  Location: [facility];  Service: Urology;  Laterality: Left;  Marland Kitchen GASTRIC BYPASS    . KNEE SURGERY    . LITHOTRIPSY    . MIDDLE EAR SURGERY    . NASAL SEPTUM SURGERY    . URETEROSCOPY WITH HOLMIUM LASER LITHOTRIPSY     perforated ureter. had nephrostomy tube for brief time  . URETEROSCOPY WITH HOLMIUM LASER LITHOTRIPSY Left 01/17/2017   Procedure: URETEROSCOPY WITH HOLMIUM LASER LITHOTRIPSY;  Surgeon: Hildred Laser, MD;  Location: [facility];  Service: Urology;  Laterality: Left;    There were no vitals filed for this visit.      Subjective Assessment - 04/04/17 1442    Subjective Pt went to her MD today who provided her initial and follow up x ray results.  Pt was surprised to find out that in fact both the distal radius and distal ulna were fx'd as pt was initially told she only had fx'd her distal radius. No new precautions with PT and MD encouraged pt to continue with PT.  Pt has been completing her HEP as prescribed with no concerns or questions.  Did not complete her HEP yesterday as she was still sore from last session. Pt reports  she is having difficulty lifting the cat litter bag to pour into the litter box, holding her dishes while cleaning, opening jars or bottles.     Pertinent History Pt. known well to PT clinic.  See medical records.     Patient Stated Goals Increase L wrist ROM/ strength/ pain mgmt.     Currently in Pain? Yes   Pain Score 5    Pain Location Wrist   Pain Orientation Left   Pain Descriptors / Indicators Aching   Pain Type Chronic pain   Pain Onset More than a month ago   Pain Frequency Constant   Multiple Pain Sites No       Treatment:    Ther-ex:?   Seated L wrist eccentric extension x10 with 1# weight   Pinching grip with 3rd digit and thumb with clothespins x15. Pt reports and demonstrates  difficulty with this due to weakness.   Seated L shoulder ex 2x10 each exercise: YTB Bicep curls/ sh. Flexion/ serratus punches?   Hammer sup/pron. 15x in sitting posture.   Pt holding 1# weight in neutral L wrist position with perturbations to simulate dish washing. 2x1 minute. Fatigue noted at end of each set.   Static holding dumbbell in position pt holds litter bag. 25 seconds with 4# weight. 60 seconds with 4# weight.     Manual therapy:   L wrist AAROM:?flexion/extension, radial/ulnar deviation, supination/pronation; each performed x 5 with 10 sec hold??   Manually resisted L composite digit extension x10, pt reports and demonstrates difficulty with this due to weakness.   Ice to L wrist after tx. In sitting with L forearm supported by pillow. (unbilled)?             PT Education - 04/04/17 1444    Education provided Yes   Education Details Exercise technique   Person(s) Educated Patient   Methods Demonstration;Explanation;Verbal cues   Comprehension Verbalized understanding;Returned demonstration;Verbal cues required;Need further instruction             PT Long Term Goals - 03/19/17 2105      PT LONG TERM GOAL #1   Title Pt. independent with HEP to increase L wrist AROM to WNL as compared to R wrist to improve mobility.    Baseline Decrease (R/L) wrist AROM: flexion 68/48 deg., flexion 42/25 deg., supination WNL/ 62 deg., pronation WNL.  Good sensation/ finger AROM.   Time 4   Period Weeks   Status New   Target Date 04/16/17     PT LONG TERM GOAL #2   Title Pt. will increase L grip strength to >32# to improve grasping/ carrying tasks without pain.     Baseline Grip strength: R 41#/ L 24#.     Time 4   Period Weeks   Status New   Target Date 04/16/17     PT LONG TERM GOAL #3   Title Pt. will decrease QuickDASH to <18% to improve UE pain-free mobility.    Baseline QuickDASH on 10/8:  27.3%   Time 4   Period Weeks   Status New   Target Date  04/16/17               Plan - 04/04/17 1558    Clinical Impression Statement Pt tolerated all treatment interventions well this date.  She reports difficulty with pouring litter bag and with cleaning dishses so incorporated functional exercises specific to these activities today.  Education provided to make the connection between strengthening exercises and daily activities.  Pt will continue to benefit from continued skilled PT interventions for improved strength, ROM, and functional use of LUE.     PT Frequency 2x / week   PT Duration 4 weeks   PT Treatment/Interventions ADLs/Self Care Home Management;Moist Heat;Iontophoresis 4mg /ml Dexamethasone;Cryotherapy;Therapeutic exercise;Therapeutic activities;Functional mobility training;Neuromuscular re-education;Patient/family education;Manual techniques;Passive range of motion   PT Next Visit Plan Carpal on radius/ulna mobilization, progress L wrist/elbow strengthening (RTB).     PT Home Exercise Plan Yellow t-band resisted exercise in all plance for L wrist, L wrist AROM, putty squeeze    Consulted and Agree with Plan of Care Patient      Patient will benefit from skilled therapeutic intervention in order to improve the following deficits and impairments:  Hypomobility, Decreased range of motion, Decreased strength, Impaired flexibility, Pain  Visit Diagnosis: Pain in left wrist  Muscle weakness (generalized)  Stiffness of left wrist joint     Problem List Patient Active Problem List   Diagnosis Date Noted  . Nephrolithiasis 01/26/2017  . Left ureteral stone 01/03/2017  . Endometrial disorder 07/08/2015  . Bilateral occipital neuralgia 03/02/2015  . DDD (degenerative disc disease), lumbar 12/24/2014  . Facet syndrome, lumbar 12/24/2014  . Sacroiliac joint dysfunction 12/24/2014    Encarnacion Chu PT, DPT 04/04/2017, 4:00 PM  [place] [facility] [address] [place], [place],  [address] Phone: [phone]   Fax:  [phone]  Name: KURT AZIMI MRN: [id number] Date of Birth: [birth year]

## 2017-04-06 ENCOUNTER — Telehealth: Payer: Self-pay | Admitting: Urology

## 2017-04-06 NOTE — Telephone Encounter (Signed)
patient was doing a 24hr urine  And said she messed the kit up and needs another one mailed out to her. Can someone please take care of this?   Sharyn Lull

## 2017-04-09 ENCOUNTER — Encounter: Payer: BC Managed Care – PPO | Admitting: Physical Therapy

## 2017-04-11 ENCOUNTER — Encounter: Payer: Self-pay | Admitting: Physical Therapy

## 2017-04-11 ENCOUNTER — Ambulatory Visit: Payer: BC Managed Care – PPO | Admitting: Physical Therapy

## 2017-04-11 DIAGNOSIS — M25562 Pain in left knee: Secondary | ICD-10-CM

## 2017-04-11 DIAGNOSIS — M25632 Stiffness of left wrist, not elsewhere classified: Secondary | ICD-10-CM

## 2017-04-11 DIAGNOSIS — M25532 Pain in left wrist: Secondary | ICD-10-CM

## 2017-04-11 DIAGNOSIS — M6281 Muscle weakness (generalized): Secondary | ICD-10-CM

## 2017-04-11 NOTE — Therapy (Addendum)
[facility] [address] [place], [place], [address] Phone: [phone]   Fax:  [phone]  Physical Therapy Treatment  Patient Details  Name: Mariah Reeves MRN: [id number] Date of Birth: 12-Oct-1965 Referring Provider: Dr. Martha ClanKrasinski  Encounter Date: 04/11/2017      PT End of Session - 04/11/17 2106    Visit Number 6   Number of Visits 8   Date for PT Re-Evaluation 04/16/17   Authorization Type no g codes   PT Start Time 1510   PT Stop Time 1609   PT Time Calculation (min) 59 min   Activity Tolerance Patient tolerated treatment well;No increased pain   Behavior During Therapy WFL for tasks assessed/performed      Past Medical History:  Diagnosis Date  . Allergy   . Anomaly, uterus    tumor of uterus  . Anxiety   . Bilateral occipital neuralgia   . Bipolar 1 disorder (HCC)   . Chronic back pain   . Degenerative disc disease, lumbar   . Depression   . Facet syndrome, lumbar   . Headache    Migraines  . History of kidney stones    Left  . Hx of vertigo   . Nephrolithiasis 01/26/2017  . Patella fracture    Left  . Sleep apnea    C-PAP    Past Surgical History:  Procedure Laterality Date  . ABDOMINOPLASTY  2006  . CARPAL TUNNEL RELEASE Left   . CHOLECYSTECTOMY    . CYSTOSCOPY W/ URETERAL STENT PLACEMENT  01/03/2017   Procedure: CYSTOSCOPY WITH RETROGRADE PYELOGRAM/URETERAL STENT PLACEMENT;  Surgeon: Hildred LaserBudzyn, Brian James, MD;  Location: [facility];  Service: Urology;;  . Bluford KaufmannYSTOSCOPY W/ URETERAL STENT PLACEMENT Left 01/17/2017   Procedure: CYSTOSCOPY WITH STENT REPLACEMENT;  Surgeon: Hildred LaserBudzyn, Brian James, MD;  Location: [facility];  Service: Urology;  Laterality: Left;  . DILATATION & CURETTAGE/HYSTEROSCOPY WITH MYOSURE N/A 07/08/2015   Procedure: DILATATION & CURETTAGE/HYSTEROSCOPY WITH MYOSURE;  Surgeon: Nadara Mustardobert P Harris, MD;  Location: [facility];  Service: Gynecology;  Laterality: N/A;  . DILATION AND CURETTAGE OF UTERUS      1/17  . EXTRACORPOREAL SHOCK WAVE LITHOTRIPSY Left 12/07/2016   Procedure: EXTRACORPOREAL SHOCK WAVE LITHOTRIPSY (ESWL);  Surgeon: Hildred LaserBudzyn, Brian James, MD;  Location: [facility];  Service: Urology;  Laterality: Left;  Marland Kitchen. GASTRIC BYPASS    . KNEE SURGERY    . LITHOTRIPSY    . MIDDLE EAR SURGERY    . NASAL SEPTUM SURGERY    . URETEROSCOPY WITH HOLMIUM LASER LITHOTRIPSY     perforated ureter. had nephrostomy tube for brief time  . URETEROSCOPY WITH HOLMIUM LASER LITHOTRIPSY Left 01/17/2017   Procedure: URETEROSCOPY WITH HOLMIUM LASER LITHOTRIPSY;  Surgeon: Hildred LaserBudzyn, Brian James, MD;  Location: [facility];  Service: Urology;  Laterality: Left;    There were no vitals filed for this visit.      Subjective Assessment - 04/11/17 2105    Pertinent History Pt. known well to PT clinic.  See medical records.     Patient Stated Goals Increase L wrist ROM/ strength/ pain mgmt.     Currently in Pain? Yes   Pain Score 6    Pain Location Wrist   Pain Orientation Left      Pt. reports soreness in L wrist over weekend.  Pt. state she was limited with HEP due to L wrist discomfort.  Pt scheduled to fly to [place] tomorrow for some time with sister until Sunday.  PT encouraged pt. to remain active with L wrist ex./ functional movement.       Treatment:    Ther-ex:?   Seated L wrist AROM all planes (pain tolerable)- 5x each.  L wrist eccentric muscle control with extension x10 with manual resistance.  Standing L shoulder ex. 2x10 each exercise: YTB Bicep curls/ sh. Flexion/ serratus punches?   L wrist radial/ulnar deviation/ supination/ pronation manual resistance (min. To mod. Resistance)- 5x each.   Supine 1# weight in neutral L wrist position with serratus punches/ sh. Flexion/ horizontal abd. 20x each.   Reviewed HEP   Manual therapy:   L wrist AAROM:?flexion/extension, radial/ulnar deviation, supination/pronation; each performed x 5 with 10 sec hold??  Manually resisted L  composite digit extension x10, pt reports and demonstrates difficulty with this due to weakness.   Carpal bone reassessment on L wrist as compared to R (good mobility).    Ice to L wrist after tx. In sitting with L forearm supported by pillow. (unbilled)?        L wrist AROM progressing in all planes with minimal c/o pain reported.  Good carpal bone mobility with gentle mobs.  Moderate grip strength/ grasping weakness noted with no increase strength noted in L wrist flexors/extensors today.  No change to HEP at this time.  PT will reassess all pt. goals next visit and progress HEP as tolerated.           PT Long Term Goals - 03/19/17 2105      PT LONG TERM GOAL #1   Title Pt. independent with HEP to increase L wrist AROM to WNL as compared to R wrist to improve mobility.    Baseline Decrease (R/L) wrist AROM: flexion 68/48 deg., flexion 42/25 deg., supination WNL/ 62 deg., pronation WNL.  Good sensation/ finger AROM.   Time 4   Period Weeks   Status New   Target Date 04/16/17     PT LONG TERM GOAL #2   Title Pt. will increase L grip strength to >32# to improve grasping/ carrying tasks without pain.     Baseline Grip strength: R 41#/ L 24#.     Time 4   Period Weeks   Status New   Target Date 04/16/17     PT LONG TERM GOAL #3   Title Pt. will decrease QuickDASH to <18% to improve UE pain-free mobility.    Baseline QuickDASH on 10/8:  27.3%   Time 4   Period Weeks   Status New   Target Date 04/16/17               Plan - 04/11/17 2106    Clinical Presentation Stable   Clinical Decision Making Low   Rehab Potential Good   PT Frequency 2x / week   PT Duration 4 weeks   PT Treatment/Interventions ADLs/Self Care Home Management;Moist Heat;Iontophoresis 4mg /ml Dexamethasone;Cryotherapy;Therapeutic exercise;Therapeutic activities;Functional mobility training;Neuromuscular re-education;Patient/family education;Manual techniques;Passive range of motion   PT Next  Visit Plan Carpal on radius/ulna mobilization, progress L wrist/elbow strengthening (RTB).     PT Home Exercise Plan Yellow t-band resisted exercise in all plance for L wrist, L wrist AROM, putty squeeze    Consulted and Agree with Plan of Care Patient      Patient will benefit from skilled therapeutic intervention in order to improve the following deficits and impairments:  Hypomobility, Decreased range of motion, Decreased strength, Impaired flexibility, Pain  Visit Diagnosis: Pain in left wrist  Muscle weakness (generalized)  Stiffness of  left wrist joint  Acute pain of left knee     Problem List Patient Active Problem List   Diagnosis Date Noted  . Nephrolithiasis 01/26/2017  . Left ureteral stone 01/03/2017  . Endometrial disorder 07/08/2015  . Bilateral occipital neuralgia 03/02/2015  . DDD (degenerative disc disease), lumbar 12/24/2014  . Facet syndrome, lumbar 12/24/2014  . Sacroiliac joint dysfunction 12/24/2014   Cammie Mcgee, PT, DPT # [id number] 04/11/2017, 9:08 PM  [facility] [facility] [address] [place], [place], [address] Phone: [phone]   Fax:  [phone]  Name: Mariah Reeves MRN: [id number] Date of Birth: 1966/02/21

## 2017-04-17 ENCOUNTER — Encounter: Payer: BC Managed Care – PPO | Admitting: Physical Therapy

## 2017-04-18 ENCOUNTER — Ambulatory Visit: Payer: BC Managed Care – PPO | Admitting: Physical Therapy

## 2017-04-23 ENCOUNTER — Ambulatory Visit: Payer: BC Managed Care – PPO | Attending: Orthopedic Surgery | Admitting: Physical Therapy

## 2017-04-23 ENCOUNTER — Encounter: Payer: Self-pay | Admitting: Physical Therapy

## 2017-04-23 DIAGNOSIS — M25532 Pain in left wrist: Secondary | ICD-10-CM | POA: Diagnosis present

## 2017-04-23 DIAGNOSIS — M6281 Muscle weakness (generalized): Secondary | ICD-10-CM | POA: Insufficient documentation

## 2017-04-23 DIAGNOSIS — M25562 Pain in left knee: Secondary | ICD-10-CM | POA: Insufficient documentation

## 2017-04-23 DIAGNOSIS — M25632 Stiffness of left wrist, not elsewhere classified: Secondary | ICD-10-CM | POA: Diagnosis present

## 2017-04-23 NOTE — Therapy (Addendum)
[place] [facility] [address] [place], [place], [address] Phone: [phone]   Fax:  [phone]  Physical Therapy Treatment  Patient Details  Name: Mariah Reeves MRN: [id number] Date of Birth: 31-Jan-1966 Referring Provider: Dr. Mack Guise   Encounter Date: 04/23/2017  PT End of Session - 04/23/17 1605    Visit Number  7    Number of Visits  14    Date for PT Re-Evaluation  05/21/17    Authorization Type  no g codes    PT Start Time  1520    PT Stop Time  5809    PT Time Calculation (min)  44 min    Activity Tolerance  Patient tolerated treatment well;No increased pain    Behavior During Therapy  WFL for tasks assessed/performed       Past Medical History:  Diagnosis Date  . Allergy   . Anomaly, uterus    tumor of uterus  . Anxiety   . Bilateral occipital neuralgia   . Bipolar 1 disorder ([place])   . Chronic back pain   . Degenerative disc disease, lumbar   . Depression   . Facet syndrome, lumbar   . Headache    Migraines  . History of kidney stones    Left  . Hx of vertigo   . Nephrolithiasis 01/26/2017  . Patella fracture    Left  . Sleep apnea    C-PAP    Past Surgical History:  Procedure Laterality Date  . ABDOMINOPLASTY  2006  . CARPAL TUNNEL RELEASE Left   . CHOLECYSTECTOMY    . DILATION AND CURETTAGE OF UTERUS     1/17  . GASTRIC BYPASS    . KNEE SURGERY    . LITHOTRIPSY    . MIDDLE EAR SURGERY    . NASAL SEPTUM SURGERY    . URETEROSCOPY WITH HOLMIUM LASER LITHOTRIPSY     perforated ureter. had nephrostomy tube for brief time    There were no vitals filed for this visit.  Subjective Assessment - 04/23/17 1501    Subjective  Pt reports no pain today. She is tired from traveling home from [place] last night. She did her HEP a few times over the weekend with min discomfort.     Pertinent History  Pt. known well to PT clinic.  See medical records.      Patient Stated Goals  Increase L wrist ROM/ strength/  pain mgmt.      Currently in Pain?  No/denies    Pain Onset  More than a month ago         Treatment:   UE bike x 5 min (unbilled)   Manual therapy:  L wrist manually resisted isometric 2 x 10 each of flexion/extension, radial/ulnar deviation, supination/pronation; each performed x 5 with 10 sec hold??  Ther-ex:? Standing Nautilus:     Elbow flexion/ extension x 20 each UE with 20#     Radial /ulnar deviation x 20 each UE 20#   Wrist flexion/extension x 20 each UE 10#       Sitting:    Manually resisted supination/ pronation/ flexion/ extension/ radial / ulnar deviation  x 5 each with 5 sec isokinetic resistance       Reviewed HEP  Pt reported no pain following therapy       PT Education - 04/23/17 1613    Education provided  Yes    Education Details  Progress towards goals    Person(s) Educated  Patient    Methods  Explanation;Demonstration;Tactile cues;Verbal cues    Comprehension  Verbal cues required;Returned demonstration;Verbalized understanding;Tactile cues required          PT Long Term Goals - 04/23/17 1535      PT LONG TERM GOAL #1   Title  Pt. independent with HEP to increase L wrist AROM to WNL as compared to R wrist to improve mobility.     Baseline  Decrease (R/L) wrist AROM: flexion 68/60 deg., extension 42/46 deg., supination WNL/ 62 deg., pronation WNL.  Good sensation/ finger AROM.    Time  4    Period  Weeks    Status  Achieved    Target Date  04/16/17      PT LONG TERM GOAL #2   Title  Pt. will increase L grip strength to >32# to improve grasping/ carrying tasks without pain.      Baseline  Grip strength: R 41#/ L 24#.       R 39/L 27# on 04/23/17    Time  3    Period  Weeks    Status  Partially Met    Target Date  05/14/17      PT LONG TERM GOAL #3   Title  Pt. will decrease QuickDASH to <18% to improve UE pain-free mobility.     Baseline  QuickDASH on 10/8:  27.3%      29.5% disability on 11/12    Time  3    Period  Weeks     Status  Not Met    Target Date  05/14/17      PT LONG TERM GOAL #4   Title  Pt will report no pain when opening jars soda bottles to improve function with ADL's    Baseline  Pain with opening jars and soda bottles    Time  3    Period  Weeks    Status  New    Target Date  05/14/17         Plan - 04/23/17 1613    Clinical Impression Statement  Pt's goals re-assessed today; she has improved in L wrist AROM (goal met) to WNL compared to R wrist AROM. Her L UE/forearm strength has improved approx 50% to goal. There was no significant change in grip strength at this time but less pain reported. Plan to continue therapy for 3-4 more weeks in order to achieve all goals and maximize UE function.     Clinical Presentation  Stable    Clinical Decision Making  Low    Rehab Potential  Good    PT Frequency  2x / week    PT Duration  4 weeks    PT Treatment/Interventions  ADLs/Self Care Home Management;Moist Heat;Iontophoresis '4mg'$ /ml Dexamethasone;Cryotherapy;Therapeutic exercise;Therapeutic activities;Functional mobility training;Neuromuscular re-education;Patient/family education;Manual techniques;Passive range of motion    PT Next Visit Plan  Isometrics, nautilus concentric AROM, progress L wrist/elbow strengthening (RTB).      PT Home Exercise Plan  Continued: Yellow t-band resisted exercise in all plance for L wrist, L wrist AROM, putty squeeze     Consulted and Agree with Plan of Care  Patient       Patient will benefit from skilled therapeutic intervention in order to improve the following deficits and impairments:  Hypomobility, Decreased range of motion, Decreased strength, Impaired flexibility, Pain  Visit Diagnosis: Pain in left wrist - Plan: PT plan of care cert/re-cert  Muscle weakness (generalized) - Plan: PT plan of care cert/re-cert  Stiffness  of left wrist joint - Plan: PT plan of care cert/re-cert  Acute pain of left knee - Plan: PT plan of care cert/re-cert     Problem  List Patient Active Problem List   Diagnosis Date Noted  . Nephrolithiasis 01/26/2017  . Left ureteral stone 01/03/2017  . Endometrial disorder 07/08/2015  . Bilateral occipital neuralgia 03/02/2015  . DDD (degenerative disc disease), lumbar 12/24/2014  . Facet syndrome, lumbar 12/24/2014  . Sacroiliac joint dysfunction 12/24/2014   Chistine Dematteo Lenis Dickinson, SPT Pura Spice, PT, DPT # [id number] 04/23/2017, 5:43 PM  [place] [facility] [address] [place], [place], [address] Phone: [phone]   Fax:  [phone]  Name: Mariah Reeves MRN: [id number] Date of Birth: Oct 05, 1965

## 2017-04-24 NOTE — Telephone Encounter (Signed)
New orders faxed

## 2017-04-25 ENCOUNTER — Encounter: Payer: Self-pay | Admitting: Physical Therapy

## 2017-04-25 ENCOUNTER — Ambulatory Visit: Payer: BC Managed Care – PPO | Admitting: Physical Therapy

## 2017-04-25 DIAGNOSIS — M6281 Muscle weakness (generalized): Secondary | ICD-10-CM

## 2017-04-25 DIAGNOSIS — M25532 Pain in left wrist: Secondary | ICD-10-CM | POA: Diagnosis not present

## 2017-04-25 DIAGNOSIS — M25632 Stiffness of left wrist, not elsewhere classified: Secondary | ICD-10-CM

## 2017-04-25 NOTE — Therapy (Signed)
[facility] [address]. [place], [place], [address] Phone: [phone]   Fax:  [phone]  Physical Therapy Treatment  Patient Details  Name: Mariah Reeves MRN: [id number] Date of Birth: [birth year] Referring Provider: Dr. Mack Guise   Encounter Date: 04/25/2017  PT End of Session - 04/25/17 1712    Visit Number  8    Number of Visits  14    Date for PT Re-Evaluation  05/21/17    Authorization Type  no g codes    PT Start Time  08-18-44    PT Stop Time  08/19/30    PT Time Calculation (min)  46 min    Activity Tolerance  Patient tolerated treatment well;No increased pain    Behavior During Therapy  WFL for tasks assessed/performed       Past Medical History:  Diagnosis Date  . Allergy   . Anomaly, uterus    tumor of uterus  . Anxiety   . Bilateral occipital neuralgia   . Bipolar 1 disorder ([place])   . Chronic back pain   . Degenerative disc disease, lumbar   . Depression   . Facet syndrome, lumbar   . Headache    Migraines  . History of kidney stones    Left  . Hx of vertigo   . Nephrolithiasis 01/26/2017  . Patella fracture    Left  . Sleep apnea    C-PAP    Past Surgical History:  Procedure Laterality Date  . ABDOMINOPLASTY  08/18/2004  . CARPAL TUNNEL RELEASE Left   . CHOLECYSTECTOMY    . CYSTOSCOPY W/ URETERAL STENT PLACEMENT  01/03/2017   Procedure: CYSTOSCOPY WITH RETROGRADE PYELOGRAM/URETERAL STENT PLACEMENT;  Surgeon: Nickie Retort, MD;  Location: [facility];  Service: Urology;;  . Consuela Mimes W/ URETERAL STENT PLACEMENT Left 01/17/2017   Procedure: CYSTOSCOPY WITH STENT REPLACEMENT;  Surgeon: Nickie Retort, MD;  Location: [facility];  Service: Urology;  Laterality: Left;  . DILATATION & CURETTAGE/HYSTEROSCOPY WITH MYOSURE N/A 07/08/2015   Procedure: DILATATION & CURETTAGE/HYSTEROSCOPY WITH MYOSURE;  Surgeon: Gae Dry, MD;  Location: [facility];  Service: Gynecology;  Laterality: N/A;  . DILATION AND  CURETTAGE OF UTERUS     1/17  . EXTRACORPOREAL SHOCK WAVE LITHOTRIPSY Left 12/07/2016   Procedure: EXTRACORPOREAL SHOCK WAVE LITHOTRIPSY (ESWL);  Surgeon: Nickie Retort, MD;  Location: [facility];  Service: Urology;  Laterality: Left;  Marland Kitchen GASTRIC BYPASS    . KNEE SURGERY    . LITHOTRIPSY    . MIDDLE EAR SURGERY    . NASAL SEPTUM SURGERY    . URETEROSCOPY WITH HOLMIUM LASER LITHOTRIPSY     perforated ureter. had nephrostomy tube for brief time  . URETEROSCOPY WITH HOLMIUM LASER LITHOTRIPSY Left 01/17/2017   Procedure: URETEROSCOPY WITH HOLMIUM LASER LITHOTRIPSY;  Surgeon: Nickie Retort, MD;  Location: [facility];  Service: Urology;  Laterality: Left;    There were no vitals filed for this visit.  Subjective Assessment - 04/25/17 1449    Subjective  Pt reports she has not done HEP since last visit. No pain currently; pt had mild soreness after therapy last session that lasted only a few hours     Pertinent History  Pt. known well to PT clinic.  See medical records.      Patient Stated Goals  Increase L wrist ROM/ strength/ pain mgmt.      Currently in Pain?  No/denies    Pain Onset  More than a  month ago         Treatment:   Manual therapy:    L wrist manually resisted isometric 2 x 10 each of flexion/extension, radial/ulnar deviation, supination/pronation; each performed x 5 with 10 sec hold?   Manually resisted L wrist AROM: supination/ pronation/ flexion/ extension/ radial / ulnar deviation  x 5 each with 5 sec isokinetic resistance ?   Contract relax stretching of L wrist at end range in all planes using muscle energy technique for contract-relax stretch, 5 sec contract and 20 sec relax x 3 iterations    STM to upper traps and interscapular musculature x 5-6 min with multiple trigger points and tightness noted.  Ther-ex:? Standing Nautilus:            Elbow flexion/ extension BUE with bar x 20 each UE with 20# (30# attempted)           Hammer curls/pull-downs single UE  with bar hanging vertically x 20 each UE 20#             Wrist flexion/extension x 20 each UE 10#   Contract relax stretching of L wrist at end range in all planes added to HEP  PT Education - 04/25/17 1711    Education provided  Yes    Education Details  Contract-relax stretch technique    Person(s) Educated  Patient    Methods  Explanation;Demonstration;Tactile cues;Verbal cues    Comprehension  Verbalized understanding;Returned demonstration;Verbal cues required;Tactile cues required          PT Long Term Goals - 04/23/17 1535      PT LONG TERM GOAL #1   Title  Pt. independent with HEP to increase L wrist AROM to WNL as compared to R wrist to improve mobility.     Baseline  Decrease (R/L) wrist AROM: flexion 68/60 deg., extension 42/46 deg., supination WNL/ 62 deg., pronation WNL.  Good sensation/ finger AROM.    Time  4    Period  Weeks    Status  Achieved    Target Date  04/16/17      PT LONG TERM GOAL #2   Title  Pt. will increase L grip strength to >32# to improve grasping/ carrying tasks without pain.      Baseline  Grip strength: R 41#/ L 24#.       R 39/L 27# on 04/23/17    Time  3    Period  Weeks    Status  Partially Met    Target Date  05/14/17      PT LONG TERM GOAL #3   Title  Pt. will decrease QuickDASH to <18% to improve UE pain-free mobility.     Baseline  QuickDASH on 10/8:  27.3%      29.5% disability on 11/12    Time  3    Period  Weeks    Status  Not Met    Target Date  05/14/17      PT LONG TERM GOAL #4   Title  Pt will report no pain when opening jars soda bottles to improve function with ADL's    Baseline  Pain with opening jars and soda bottles    Time  3    Period  Weeks    Status  New    Target Date  05/14/17         Plan - 04/25/17 1732    Clinical Impression Statement  Pt had only moderate soreness in L wrist and shoulders  for a few hours after last session and little to no pain since then. She has not tried to open any jars  lately, but reports no difficulty opening doors. Pt had only minimal transient L wrist pain with mobilization and isometric exercise today that was alleviated with change of hand position.     Clinical Presentation  Stable    Clinical Decision Making  Low    Rehab Potential  Good    PT Frequency  2x / week    PT Duration  4 weeks    PT Treatment/Interventions  ADLs/Self Care Home Management;Moist Heat;Iontophoresis '4mg'$ /ml Dexamethasone;Cryotherapy;Therapeutic exercise;Therapeutic activities;Functional mobility training;Neuromuscular re-education;Patient/family education;Manual techniques;Passive range of motion    PT Next Visit Plan  Progress to resisted AROM exercises; Isometrics, nautilus concentric AROM, progress L wrist/elbow strengthening (RTB).      PT Home Exercise Plan  Continued: Yellow t-band resisted exercise in all plance for L wrist, L wrist AROM, putty squeeze     Consulted and Agree with Plan of Care  Patient       Patient will benefit from skilled therapeutic intervention in order to improve the following deficits and impairments:  Hypomobility, Decreased range of motion, Decreased strength, Impaired flexibility, Pain  Visit Diagnosis: Pain in left wrist  Muscle weakness (generalized)  Stiffness of left wrist joint     Problem List Patient Active Problem List   Diagnosis Date Noted  . Nephrolithiasis 01/26/2017  . Left ureteral stone 01/03/2017  . Endometrial disorder 07/08/2015  . Bilateral occipital neuralgia 03/02/2015  . DDD (degenerative disc disease), lumbar 12/24/2014  . Facet syndrome, lumbar 12/24/2014  . Sacroiliac joint dysfunction 12/24/2014   Shandie Bertz Lenis Dickinson, SPT Pura Spice, PT, DPT # [id number] 04/26/2017, 8:15 AM  [facility] [address] [place], [place], [address] Phone: [phone]   Fax:  [phone]  Name: Mariah Reeves MRN: [id number] Date of Birth: 02-22-1966

## 2017-04-26 ENCOUNTER — Encounter: Payer: Self-pay | Admitting: Physical Therapy

## 2017-05-01 ENCOUNTER — Ambulatory Visit: Payer: BC Managed Care – PPO | Admitting: Physical Therapy

## 2017-05-01 ENCOUNTER — Encounter: Payer: Self-pay | Admitting: Physical Therapy

## 2017-05-01 DIAGNOSIS — M25532 Pain in left wrist: Secondary | ICD-10-CM | POA: Diagnosis not present

## 2017-05-01 DIAGNOSIS — M25562 Pain in left knee: Secondary | ICD-10-CM

## 2017-05-01 DIAGNOSIS — M6281 Muscle weakness (generalized): Secondary | ICD-10-CM

## 2017-05-01 DIAGNOSIS — M25632 Stiffness of left wrist, not elsewhere classified: Secondary | ICD-10-CM

## 2017-05-01 NOTE — Therapy (Signed)
[facility] [address] [place], [place], [address] Phone: [phone]   Fax:  [phone]  Physical Therapy Treatment  Patient Details  Name: Mariah Reeves MRN: [id number] Date of Birth: 08/08/1965 Referring Provider: Dr. Mack Guise   Encounter Date: 05/01/2017  PT End of Session - 05/01/17 1821    Visit Number  9    Number of Visits  14    Date for PT Re-Evaluation  05/21/17    Authorization Type  no g codes    PT Start Time  08/20/1502    PT Stop Time  1660    PT Time Calculation (min)  54 min    Activity Tolerance  Patient tolerated treatment well;No increased pain    Behavior During Therapy  WFL for tasks assessed/performed       Past Medical History:  Diagnosis Date  . Allergy   . Anomaly, uterus    tumor of uterus  . Anxiety   . Bilateral occipital neuralgia   . Bipolar 1 disorder ([place])   . Chronic back pain   . Degenerative disc disease, lumbar   . Depression   . Facet syndrome, lumbar   . Headache    Migraines  . History of kidney stones    Left  . Hx of vertigo   . Nephrolithiasis 01/26/2017  . Patella fracture    Left  . Sleep apnea    C-PAP    Past Surgical History:  Procedure Laterality Date  . ABDOMINOPLASTY  08/20/04  . CARPAL TUNNEL RELEASE Left   . CHOLECYSTECTOMY    . CYSTOSCOPY W/ URETERAL STENT PLACEMENT  01/03/2017   Procedure: CYSTOSCOPY WITH RETROGRADE PYELOGRAM/URETERAL STENT PLACEMENT;  Surgeon: Nickie Retort, MD;  Location: [facility];  Service: Urology;;  . Consuela Mimes W/ URETERAL STENT PLACEMENT Left 01/17/2017   Procedure: CYSTOSCOPY WITH STENT REPLACEMENT;  Surgeon: Nickie Retort, MD;  Location: [facility];  Service: Urology;  Laterality: Left;  . DILATATION & CURETTAGE/HYSTEROSCOPY WITH MYOSURE N/A 07/08/2015   Procedure: DILATATION & CURETTAGE/HYSTEROSCOPY WITH MYOSURE;  Surgeon: Gae Dry, MD;  Location: [facility];  Service: Gynecology;  Laterality: N/A;  . DILATION AND  CURETTAGE OF UTERUS     1/17  . EXTRACORPOREAL SHOCK WAVE LITHOTRIPSY Left 12/07/2016   Procedure: EXTRACORPOREAL SHOCK WAVE LITHOTRIPSY (ESWL);  Surgeon: Nickie Retort, MD;  Location: [facility];  Service: Urology;  Laterality: Left;  Marland Kitchen GASTRIC BYPASS    . KNEE SURGERY    . LITHOTRIPSY    . MIDDLE EAR SURGERY    . NASAL SEPTUM SURGERY    . URETEROSCOPY WITH HOLMIUM LASER LITHOTRIPSY     perforated ureter. had nephrostomy tube for brief time  . URETEROSCOPY WITH HOLMIUM LASER LITHOTRIPSY Left 01/17/2017   Procedure: URETEROSCOPY WITH HOLMIUM LASER LITHOTRIPSY;  Surgeon: Nickie Retort, MD;  Location: [facility];  Service: Urology;  Laterality: Left;    There were no vitals filed for this visit.  Subjective Assessment - 05/01/17 1516    Subjective  Pt had some soreness over the weekend in L wrist, but is having no pain currently. She feels soreness was due to the weather.    Pertinent History  Pt. known well to PT clinic.  See medical records.      Patient Stated Goals  Increase L wrist ROM/ strength/ pain mgmt.      Currently in Pain?  No/denies    Pain Onset  More than a month ago  Warm-up on UE bike x 4 min in each direction with BUE (un-billed)  Treatment:  Manual Therapy: Sitting:  Isometric L wrist strengthening in all directions: flexion, extension, ulnar/radial deviation, pronation, supination   L wrist stratching in all planes (flexion, extension, ulnar/radial deviation, pronation, supination) using muscle energy technique for contract-relax stretch, 5 sec contract and 20 sec relax x 3 iterations each side  Ther-ex: Sitting:   Resisted L wrist AROM in flexion, extension, ulnar/radial deviation, pronation, supination using 3# dumbbell x 20 reps in each motion   Standing Nautilus:  20# Triceps pull down x 20  10# biceps curls x 20  20# Hammer triceps pull down with bar x 20  10# Hammer curls x 20 each side  20# row with bar x 20     PT Education -  05/01/17 1821    Education provided  Yes    Education Details  Exercise technique    Person(s) Educated  Patient    Methods  Explanation;Demonstration;Tactile cues;Verbal cues    Comprehension  Verbalized understanding;Returned demonstration;Tactile cues required;Verbal cues required          PT Long Term Goals - 04/23/17 1535      PT LONG TERM GOAL #1   Title  Pt. independent with HEP to increase L wrist AROM to WNL as compared to R wrist to improve mobility.     Baseline  Decrease (R/L) wrist AROM: flexion 68/60 deg., extension 42/46 deg., supination WNL/ 62 deg., pronation WNL.  Good sensation/ finger AROM.    Time  4    Period  Weeks    Status  Achieved    Target Date  04/16/17      PT LONG TERM GOAL #2   Title  Pt. will increase L grip strength to >32# to improve grasping/ carrying tasks without pain.      Baseline  Grip strength: R 41#/ L 24#.       R 39/L 27# on 04/23/17    Time  3    Period  Weeks    Status  Partially Met    Target Date  05/14/17      PT LONG TERM GOAL #3   Title  Pt. will decrease QuickDASH to <18% to improve UE pain-free mobility.     Baseline  QuickDASH on 10/8:  27.3%      29.5% disability on 11/12    Time  3    Period  Weeks    Status  Not Met    Target Date  05/14/17      PT LONG TERM GOAL #4   Title  Pt will report no pain when opening jars soda bottles to improve function with ADL's    Baseline  Pain with opening jars and soda bottles    Time  3    Period  Weeks    Status  New    Target Date  05/14/17            Plan - 05/01/17 1827    Clinical Impression       Clinical Presentation Pt had no pain this session. She is progressing into resisted full range ther-ex for L wrist with no issues. Pt has grossly equal AROM in bilateral wrists. Recommend D/C following pt's follow-up visit with her provider.      Stable    Clinical Decision Making  Low    Rehab Potential  Good    PT Frequency  2x / week  PT Duration  4  weeks    PT Treatment/Interventions  ADLs/Self Care Home Management;Moist Heat;Iontophoresis 20m/ml Dexamethasone;Cryotherapy;Therapeutic exercise;Therapeutic activities;Functional mobility training;Neuromuscular re-education;Patient/family education;Manual techniques;Passive range of motion    PT Next Visit Plan  Contiue resisted AROM exercises; Isometrics, nautilus concentric AROM, progress L wrist/elbow strengthening (RTB).      PT Home Exercise Plan  Continued: Yellow t-band resisted exercise in all plance for L wrist, L wrist AROM, putty squeeze     Consulted and Agree with Plan of Care  Patient       Patient will benefit from skilled therapeutic intervention in order to improve the following deficits and impairments:  Hypomobility, Decreased range of motion, Decreased strength, Impaired flexibility, Pain  Visit Diagnosis: Pain in left wrist  Muscle weakness (generalized)  Stiffness of left wrist joint  Acute pain of left knee     Problem List Patient Active Problem List   Diagnosis Date Noted  . Nephrolithiasis 01/26/2017  . Left ureteral stone 01/03/2017  . Endometrial disorder 07/08/2015  . Bilateral occipital neuralgia 03/02/2015  . DDD (degenerative disc disease), lumbar 12/24/2014  . Facet syndrome, lumbar 12/24/2014  . Sacroiliac joint dysfunction 12/24/2014   Kiwanna Spraker MLenis Dickinson SPT MPura Spice PT, DPT # [id number]/20/2018, 6:30 PM  [facility] [address]Phone: [phone]  Fax:  [phone] Name: JSARIYA TRICKEYMRN: [id number] of Birth: [birth year]

## 2017-05-08 ENCOUNTER — Encounter: Payer: BC Managed Care – PPO | Admitting: Physical Therapy

## 2017-05-10 ENCOUNTER — Encounter: Payer: BC Managed Care – PPO | Admitting: Physical Therapy

## 2017-05-14 ENCOUNTER — Encounter: Payer: BC Managed Care – PPO | Admitting: Physical Therapy

## 2017-07-11 ENCOUNTER — Ambulatory Visit: Payer: BC Managed Care – PPO | Admitting: Obstetrics & Gynecology

## 2017-07-30 ENCOUNTER — Ambulatory Visit (INDEPENDENT_AMBULATORY_CARE_PROVIDER_SITE_OTHER): Payer: Medicare HMO | Admitting: Obstetrics & Gynecology

## 2017-07-30 ENCOUNTER — Encounter: Payer: Self-pay | Admitting: Obstetrics & Gynecology

## 2017-07-30 VITALS — BP 122/80 | HR 90 | Ht 60.0 in | Wt 186.0 lb

## 2017-07-30 DIAGNOSIS — Z124 Encounter for screening for malignant neoplasm of cervix: Secondary | ICD-10-CM

## 2017-07-30 DIAGNOSIS — Z0000 Encounter for general adult medical examination without abnormal findings: Secondary | ICD-10-CM

## 2017-07-30 DIAGNOSIS — Z1231 Encounter for screening mammogram for malignant neoplasm of breast: Secondary | ICD-10-CM

## 2017-07-30 DIAGNOSIS — E669 Obesity, unspecified: Secondary | ICD-10-CM

## 2017-07-30 DIAGNOSIS — Z1211 Encounter for screening for malignant neoplasm of colon: Secondary | ICD-10-CM | POA: Diagnosis not present

## 2017-07-30 DIAGNOSIS — Z1239 Encounter for other screening for malignant neoplasm of breast: Secondary | ICD-10-CM

## 2017-07-30 NOTE — Patient Instructions (Signed)
PAP every three years Mammogram every year    Call [phone] to schedule at [place] Colonoscopy every 10 years Labs yearly (with PCP) 

## 2017-07-30 NOTE — Progress Notes (Signed)
HPI:      [name] is a 52 y.o. G0P0000 who LMP was in the past, she presents today for her annual examination.  The patient has no complaints today. The patient is not sexually active. Herlast pap: approximate date 2017 and was normal and last mammogram: approximate date 2017 and was normal.  The patient does perform self breast exams.  There is no notable family history of  ovarian cancer in her family, POS FH BREAST CANCER MGM. The patient is not taking hormone replacement therapy. Patient denies post-menopausal vaginal bleeding. TWO PERIODS OVER THE PAST YEAR.  The patient has regular exercise: no. The patient denies current symptoms of depression.    GYN Hx: Last Colonoscopy:3 years ago. Normal.  Last DEXA: never ago.    PMHx: Past Medical History:  Diagnosis Date  . Allergy   . Anomaly, uterus    tumor of uterus  . Anxiety   . Bilateral occipital neuralgia   . Bipolar 1 disorder ([facility])   . Chronic back pain   . Degenerative disc disease, lumbar   . Depression   . Facet syndrome, lumbar   . Headache    Migraines  . History of kidney stones    Left  . Hx of vertigo   . Nephrolithiasis 01/26/2017  . Patella fracture    Left  . Sleep apnea    C-PAP   Past Surgical History:  Procedure Laterality Date  . ABDOMINOPLASTY  2006  . CARPAL TUNNEL RELEASE Left   . CHOLECYSTECTOMY    . CYSTOSCOPY W/ URETERAL STENT PLACEMENT  01/03/2017   Procedure: CYSTOSCOPY WITH RETROGRADE PYELOGRAM/URETERAL STENT PLACEMENT;  Surgeon: Hildred LaserBudzyn, Brian James, MD;  Location: [facility];  Service: Urology;;  . Bluford KaufmannYSTOSCOPY W/ URETERAL STENT PLACEMENT Left 01/17/2017   Procedure: CYSTOSCOPY WITH STENT REPLACEMENT;  Surgeon: Hildred LaserBudzyn, Brian James, MD;  Location: [facility];  Service: Urology;  Laterality: Left;  . DILATATION & CURETTAGE/HYSTEROSCOPY WITH MYOSURE N/A 07/08/2015   Procedure: DILATATION & CURETTAGE/HYSTEROSCOPY WITH MYOSURE;  Surgeon: Nadara Mustardobert P Teresha Hanks, MD;  Location: [facility];  Service:  Gynecology;  Laterality: N/A;  . DILATION AND CURETTAGE OF UTERUS     1/17  . EXTRACORPOREAL SHOCK WAVE LITHOTRIPSY Left 12/07/2016   Procedure: EXTRACORPOREAL SHOCK WAVE LITHOTRIPSY (ESWL);  Surgeon: Hildred LaserBudzyn, Brian James, MD;  Location: [facility];  Service: Urology;  Laterality: Left;  Marland Kitchen. GASTRIC BYPASS    . KNEE SURGERY    . LITHOTRIPSY    . MIDDLE EAR SURGERY    . NASAL SEPTUM SURGERY    . URETEROSCOPY WITH HOLMIUM LASER LITHOTRIPSY     perforated ureter. had nephrostomy tube for brief time  . URETEROSCOPY WITH HOLMIUM LASER LITHOTRIPSY Left 01/17/2017   Procedure: URETEROSCOPY WITH HOLMIUM LASER LITHOTRIPSY;  Surgeon: Hildred LaserBudzyn, Brian James, MD;  Location: [facility];  Service: Urology;  Laterality: Left;   Family History  Problem Relation Age of Onset  . Hypertension Mother   . Diabetes Mother   . Heart disease Father   . Alcohol abuse Father   . Breast cancer Maternal Grandmother   . Kidney cancer Neg Hx   . Bladder Cancer Neg Hx    Social History   Tobacco Use  . Smoking status: Never Smoker  . Smokeless tobacco: Never Used  Substance Use Topics  . Alcohol use: No  . Drug use: No    Current Outpatient Medications:  .  acetaminophen (TYLENOL) 500 MG tablet, Take 1,000 mg by mouth 2 (two) times daily  as needed for headache., Disp: , Rfl:  .  escitalopram (LEXAPRO) 20 MG tablet, Take 20 mg by mouth at bedtime. Reported on 07/08/2015, Disp: , Rfl:  .  ferrous sulfate 325 (65 FE) MG tablet, Take 650 mg by mouth daily., Disp: , Rfl:  .  HYDROcodone-acetaminophen (NORCO) 10-325 MG tablet, Take 1 tablet by mouth every 6 (six) hours as needed., Disp: , Rfl:  .  lamoTRIgine (LAMICTAL) 200 MG tablet, Take 400 mg by mouth at bedtime. Reported on 11/18/2015, Disp: , Rfl:  .  Multiple Vitamins-Minerals (MULTIVITAMIN PO), Take 1 tablet by mouth daily., Disp: , Rfl:  .  ondansetron (ZOFRAN-ODT) 4 MG disintegrating tablet, Take 4 mg by mouth daily as needed for nausea or vomiting. Reported on  11/18/2015, Disp: , Rfl:  .  traZODone (DESYREL) 100 MG tablet, Take 100 mg by mouth at bedtime as needed for sleep. , Disp: , Rfl:  .  gabapentin (NEURONTIN) 600 MG tablet, Take 600 mg by mouth daily. , Disp: , Rfl:  .  naloxone (NARCAN) nasal spray 4 mg/0.1 mL, Narcan 4 mg/actuation nasal spray, Disp: , Rfl:  .  oxyCODONE-acetaminophen (ROXICET) 5-325 MG tablet, Take 1 tablet by mouth every 4 (four) hours as needed for severe pain. (Patient not taking: Reported on 01/26/2017), Disp: 30 tablet, Rfl: 0 Allergies: Contrast media [iodinated diagnostic agents]; Carbetapentane; and Tape  Review of Systems  Constitutional: Negative for chills, fever and malaise/fatigue.  HENT: Negative for congestion, sinus pain and sore throat.   Eyes: Negative for blurred vision and pain.  Respiratory: Negative for cough and wheezing.   Cardiovascular: Negative for chest pain and leg swelling.  Gastrointestinal: Negative for abdominal pain, constipation, diarrhea, heartburn, nausea and vomiting.  Genitourinary: Negative for dysuria, frequency, hematuria and urgency.  Musculoskeletal: Negative for back pain, joint pain, myalgias and neck pain.  Skin: Negative for itching and rash.  Neurological: Negative for dizziness, tremors and weakness.  Endo/Heme/Allergies: Does not bruise/bleed easily.  Psychiatric/Behavioral: Negative for depression. The patient is not nervous/anxious and does not have insomnia.    Objective: BP 122/80   Pulse 90   Ht 5' (1.524 m)   Wt 186 lb (84.4 kg)   BMI 36.33 kg/m   Filed Weights   07/30/17 1430  Weight: 186 lb (84.4 kg)   Body mass index is 36.33 kg/m. Physical Exam  Constitutional: She is oriented to person, place, and time. She appears well-developed and well-nourished. No distress.  Obesity limits exam  Genitourinary: Rectum normal, vagina normal and uterus normal. Pelvic exam was performed with patient supine. There is no rash or lesion on the right labia. There is no  rash or lesion on the left labia. Vagina exhibits no lesion. No bleeding in the vagina. Right adnexum does not display mass and does not display tenderness. Left adnexum does not display mass and does not display tenderness. Cervix does not exhibit motion tenderness, lesion, friability or polyp.   Uterus is mobile and midaxial. Uterus is not enlarged or exhibiting a mass.  HENT:  Head: Normocephalic and atraumatic. Head is without laceration.  Right Ear: Hearing normal.  Left Ear: Hearing normal.  Nose: No epistaxis.  No foreign bodies.  Mouth/Throat: Uvula is midline, oropharynx is clear and moist and mucous membranes are normal.  Eyes: Pupils are equal, round, and reactive to light.  Neck: Normal range of motion. Neck supple. No thyromegaly present.  Cardiovascular: Normal rate and regular rhythm. Exam reveals no gallop and no friction rub.  No  murmur heard. Pulmonary/Chest: Effort normal and breath sounds normal. No respiratory distress. She has no wheezes. Right breast exhibits no mass, no skin change and no tenderness. Left breast exhibits no mass, no skin change and no tenderness.  Abdominal: Soft. Bowel sounds are normal. She exhibits no distension. There is no tenderness. There is no rebound.  Musculoskeletal: Normal range of motion.  Neurological: She is alert and oriented to person, place, and time. No cranial nerve deficit.  Skin: Skin is warm and dry.  Psychiatric: She has a normal mood and affect. Judgment normal.  Vitals reviewed.  Assessment: Annual Exam 1. Annual physical exam   2. Screening for breast cancer   3. Screening for cervical cancer   4. Obesity (BMI 30-39.9)   5. Screen for colon cancer    Plan:            1.  Cervical Screening-  Pap smear done today  2. Breast screening- Exam annually and mammogram scheduled  3. Colonoscopy every 10 years, Hemoccult testing after age 56  4. Labs managed by PCP  5. Counseling for hormonal therapy: none  6.  Encouraged weight loss.    F/U  Return in about 1 year (around 07/30/2018) for Annual.  Annamarie Major, MD, [facility], [facility] 07/30/2017  2:58 PM

## 2017-08-01 LAB — IGP, APTIMA HPV
HPV Aptima: NEGATIVE
PAP Smear Comment: 0

## 2017-09-19 LAB — FECAL OCCULT BLOOD, IMMUNOCHEMICAL: Fecal Occult Bld: NEGATIVE

## 2017-12-07 ENCOUNTER — Ambulatory Visit: Payer: BC Managed Care – PPO

## 2018-03-04 ENCOUNTER — Emergency Department: Payer: Medicare HMO

## 2018-03-04 ENCOUNTER — Emergency Department
Admission: EM | Admit: 2018-03-04 | Discharge: 2018-03-04 | Disposition: A | Discharge status: Home / Self Care | Payer: Medicare HMO | Attending: Emergency Medicine | Admitting: Emergency Medicine

## 2018-03-04 ENCOUNTER — Other Ambulatory Visit: Payer: Self-pay

## 2018-03-04 DIAGNOSIS — Y999 Unspecified external cause status: Secondary | ICD-10-CM | POA: Insufficient documentation

## 2018-03-04 DIAGNOSIS — W010XXA Fall on same level from slipping, tripping and stumbling without subsequent striking against object, initial encounter: Secondary | ICD-10-CM | POA: Insufficient documentation

## 2018-03-04 DIAGNOSIS — S8001XA Contusion of right knee, initial encounter: Secondary | ICD-10-CM | POA: Diagnosis not present

## 2018-03-04 DIAGNOSIS — Y9301 Activity, walking, marching and hiking: Secondary | ICD-10-CM | POA: Insufficient documentation

## 2018-03-04 DIAGNOSIS — Z79899 Other long term (current) drug therapy: Secondary | ICD-10-CM | POA: Insufficient documentation

## 2018-03-04 DIAGNOSIS — S90211A Contusion of right great toe with damage to nail, initial encounter: Secondary | ICD-10-CM | POA: Diagnosis present

## 2018-03-04 DIAGNOSIS — Y92511 Restaurant or cafe as the place of occurrence of the external cause: Secondary | ICD-10-CM | POA: Diagnosis not present

## 2018-03-04 DIAGNOSIS — Z9884 Bariatric surgery status: Secondary | ICD-10-CM | POA: Insufficient documentation

## 2018-03-04 LAB — BASIC METABOLIC PANEL
ANION GAP: 6 (ref 5–15)
BUN: 18 mg/dL (ref 6–20)
CALCIUM: 8.7 mg/dL — AB (ref 8.9–10.3)
CO2: 27 mmol/L (ref 22–32)
Chloride: 105 mmol/L (ref 98–111)
Creatinine, Ser: 0.76 mg/dL (ref 0.44–1.00)
GFR calc non Af Amer: >60 mL/min (ref >60)
Glucose, Bld: 115 mg/dL — ABNORMAL HIGH (ref 70–99)
Potassium: 4.6 mmol/L (ref 3.5–5.1)
Sodium: 138 mmol/L (ref 135–145)

## 2018-03-04 LAB — CBC
HCT: 33.6 % — ABNORMAL LOW (ref 35.0–47.0)
HEMOGLOBIN: 11.6 g/dL — AB (ref 12.0–16.0)
MCH: 29.6 pg (ref 26.0–34.0)
MCHC: 34.4 g/dL (ref 32.0–36.0)
MCV: 86.1 fL (ref 80.0–100.0)
Platelets: 279 10*3/uL (ref 150–440)
RBC: 3.91 MIL/uL (ref 3.80–5.20)
RDW: 14.5 % (ref 11.5–14.5)
WBC: 6 10*3/uL (ref 3.6–11.0)

## 2018-03-04 MED ORDER — MECLIZINE HCL 25 MG PO TABS: 25.0000 mg | ORAL_TABLET | Freq: Three times a day (TID) | ORAL | 0 refills | Status: DC | PRN

## 2018-03-04 MED ORDER — HYDROCODONE-ACETAMINOPHEN 5-325 MG PO TABS
2.0000 | ORAL_TABLET | Freq: Once | ORAL | Status: AC
  Administered 2018-03-04: 2 via ORAL
  Filled 2018-03-04: qty 2

## 2018-03-04 NOTE — ED Provider Notes (Addendum)
[facility] Emergency Department Provider Note    First MD Initiated Contact with Patient 03/04/18 [phone]     (approximate)  I have reviewed the triage vital signs and the nursing notes.   HISTORY  Chief Complaint Fall   HPI Mariah Reeves is a 52 y.o. female with below list of chronic medical conditions including vertigo and chronic pain presents to the emergency department with history of "vertigo" tonight which the patient states caused her to strike her right great toe and right knee with resultant fall.  Patient denies any head injury no loss of consciousness.  Patient states that vertigo has improved however admits to 9 out of 10 right great toe and knee pain.  Patient states that pain is worse with any ambulation.   Past Medical History:  Diagnosis Date  . Allergy   . Anomaly, uterus    tumor of uterus  . Anxiety   . Bilateral occipital neuralgia   . Bipolar 1 disorder (HCC)   . Chronic back pain   . Degenerative disc disease, lumbar   . Depression   . Facet syndrome, lumbar   . Headache    Migraines  . History of kidney stones    Left  . Hx of vertigo   . Nephrolithiasis 01/26/2017  . Patella fracture    Left  . Sleep apnea    C-PAP    Patient Active Problem List   Diagnosis Date Noted  . Nephrolithiasis 01/26/2017  . Left ureteral stone 01/03/2017  . Endometrial disorder 07/08/2015  . Bilateral occipital neuralgia 03/02/2015  . DDD (degenerative disc disease), lumbar 12/24/2014  . Facet syndrome, lumbar 12/24/2014  . 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 ABDOMINOPLASTY  2006  . CARPAL TUNNEL RELEASE Left   . CHOLECYSTECTOMY    . CYSTOSCOPY W/ URETERAL STENT PLACEMENT  01/03/2017   Procedure: CYSTOSCOPY WITH RETROGRADE PYELOGRAM/URETERAL STENT PLACEMENT;  Surgeon: Hildred Laser, MD;  Location: [facility] ORS;  Service: Urology;;  . Bluford Kaufmann W/ URETERAL STENT PLACEMENT  Left 01/17/2017   Procedure: CYSTOSCOPY WITH STENT REPLACEMENT;  Surgeon: Hildred Laser, MD;  Location: [facility] ORS;  Service: Urology;  Laterality: Left;  . DILATATION & CURETTAGE/HYSTEROSCOPY WITH MYOSURE N/A 07/08/2015   Procedure: DILATATION & CURETTAGE/HYSTEROSCOPY WITH MYOSURE;  Surgeon: Nadara Mustard, MD;  Location: [facility] ORS;  Service: Gynecology;  Laterality: N/A;  . DILATION AND CURETTAGE OF UTERUS     1/17  . EXTRACORPOREAL SHOCK WAVE LITHOTRIPSY Left 12/07/2016   Procedure: EXTRACORPOREAL SHOCK WAVE LITHOTRIPSY (ESWL);  Surgeon: Hildred Laser, MD;  Location: [facility] ORS;  Service: Urology;  Laterality: Left;  Marland Kitchen GASTRIC BYPASS    . KNEE SURGERY    . LITHOTRIPSY    . MIDDLE EAR SURGERY    . NASAL SEPTUM SURGERY    . URETEROSCOPY WITH HOLMIUM LASER LITHOTRIPSY     perforated ureter. had nephrostomy tube for brief time  . URETEROSCOPY WITH HOLMIUM LASER LITHOTRIPSY Left 01/17/2017   Procedure: URETEROSCOPY WITH HOLMIUM LASER LITHOTRIPSY;  Surgeon: Hildred Laser, MD;  Location: [facility] ORS;  Service: Urology;  Laterality: Left;    Prior to Admission medications   Medication Sig Start Date End Date Taking? Authorizing Provider  acetaminophen (TYLENOL) 500 MG tablet Take 1,000 mg by mouth 2 (two) times daily as needed for headache.    [provider]  escitalopram (LEXAPRO) 20 MG tablet Take 20 mg by mouth at bedtime.  Reported on 07/08/2015    [provider]  ferrous sulfate 325 (65 FE) MG tablet Take 650 mg by mouth daily.    [provider]  gabapentin (NEURONTIN) 600 MG tablet Take 600 mg by mouth daily.     [provider]  HYDROcodone-acetaminophen (NORCO) 10-325 MG tablet Take 1 tablet by mouth every 6 (six) hours as needed.    [provider]  lamoTRIgine (LAMICTAL) 200 MG tablet Take 400 mg by mouth at bedtime. Reported on 11/18/2015    [provider]  meclizine (MEDI-MECLIZINE) 25 MG tablet Take 1 tablet (25 mg total)  by mouth 3 (three) times daily as needed for dizziness. 03/04/18   Darci Current, MD  Multiple Vitamins-Minerals (MULTIVITAMIN PO) Take 1 tablet by mouth daily.    [provider]  naloxone [facility]) nasal spray 4 mg/0.1 mL Narcan 4 mg/actuation nasal spray    [provider]  ondansetron (ZOFRAN-ODT) 4 MG disintegrating tablet Take 4 mg by mouth daily as needed for nausea or vomiting. Reported on 11/18/2015    [provider]  oxyCODONE-acetaminophen (ROXICET) 5-325 MG tablet Take 1 tablet by mouth every 4 (four) hours as needed for severe pain. Patient not taking: Reported on 01/26/2017 01/17/17   Hildred Laser, MD  traZODone (DESYREL) 100 MG tablet Take 100 mg by mouth at bedtime as needed for sleep.     [provider]    Allergies Contrast media [iodinated diagnostic agents]; Carbetapentane; and Tape  Family History  Problem Relation Age of Onset  . Hypertension Mother   . Diabetes Mother   . Heart disease Father   . Alcohol abuse Father   . Breast cancer Maternal Grandmother   . Kidney cancer Neg Hx   . Bladder Cancer Neg Hx     Social History Social History   Tobacco Use  . Smoking status: Never Smoker  . Smokeless tobacco: Never Used  Substance Use Topics  . Alcohol use: No  . Drug use: No    Review of Systems Constitutional: No fever/chills Eyes: No visual changes. ENT: No sore throat. Cardiovascular: Denies chest pain. Respiratory: Denies shortness of breath. Gastrointestinal: No abdominal pain.  No nausea, no vomiting.  No diarrhea.  No constipation. Genitourinary: Negative for dysuria. Musculoskeletal: Negative for neck pain.  Negative for back pain.  Positive for right great toe and right knee pain. Integumentary: Negative for rash. Neurological: Negative for headaches, focal weakness or numbness.  Positive for dizziness (now resolved)   ____________________________________________   PHYSICAL EXAM:  VITAL  SIGNS: ED Triage Vitals [03/04/18 0313]  Enc Vitals Group     BP (!) 150/63     Pulse Rate 89     Resp 16     Temp 98.5 F (36.9 C)     Temp Source Oral     SpO2 97 %     Weight 131.5 kg (290 lb)     Height 1.524 m (5')     Head Circumference      Peak Flow      Pain Score 7     Pain Loc      Pain Edu?      Excl. in GC?     Constitutional: Alert and oriented. Well appearing and in no acute distress. Eyes: Conjunctivae are normal. PERRL. EOMI. Head: Atraumatic. Mouth/Throat: Mucous membranes are moist. Oropharynx non-erythematous.* Neck: No stridor.  No meningeal signs.  No cervical spine tenderness to palpation. Cardiovascular: Normal rate, regular rhythm. Good  peripheral circulation. Grossly normal heart sounds. Respiratory: Normal respiratory effort.  No retractions. Lungs CTAB. Gastrointestinal: Soft and nontender. No distention.  Musculoskeletal: No lower extremity tenderness nor edema. No gross deformities of extremities.  Contusion right great toe PIP joint. Neurologic:  Normal speech and language. No gross focal neurologic deficits are appreciated.  Skin:  Skin is warm, dry and intact. No rash noted. Psychiatric: Mood and affect are normal. Speech and behavior are normal.  ____________________________________________   LABS (all labs ordered are listed, but only abnormal results are displayed)  Labs Reviewed  BASIC METABOLIC PANEL - Abnormal; Notable for the following components:      Result Value   Glucose, Bld 115 (*)    Calcium 8.7 (*)    All other components within normal limits  CBC - Abnormal; Notable for the following components:   Hemoglobin 11.6 (*)    HCT 33.6 (*)    All other components within normal limits    RADIOLOGY I, [place] N Jaze Rodino, personally viewed and evaluated these images (plain radiographs) as part of my medical decision making, as well as reviewing the written report by the radiologist.  ED MD interpretation: No evidence of  fracture dislocation right knee or right great toe x-rays.  Official radiology report(s): Dg Knee Complete 4 Views Right  Result Date: 03/04/2018 CLINICAL DATA:  52 year old female with right knee injury. EXAM: RIGHT KNEE - COMPLETE 4+ VIEW COMPARISON:  Right knee radiograph dated 11/01/2014 FINDINGS: There is no acute fracture or dislocation. The bones are osteopenic. Mild arthritic changes with narrowing of the medial compartment and mild bone spurring. No joint effusion. The soft tissues appear unremarkable. IMPRESSION: 1. No acute fracture or dislocation. 2. Mild arthritic changes. Electronically Signed   By: Elgie Collard M.D.   On: 03/04/2018 03:57   Dg Toe Great Right  Result Date: 03/04/2018 CLINICAL DATA:  52 year old female with injury to the right great toe. EXAM: RIGHT GREAT TOE COMPARISON:  None. FINDINGS: There is no acute fracture or dislocation. The bones are osteopenic. There is mild hallux valgus with mild erosive changes of the medial head of the first metatarsal. The soft tissues appear unremarkable. IMPRESSION: No acute fracture or dislocation. Electronically Signed   By: Elgie Collard M.D.   On: 03/04/2018 03:58   ED ECG REPORT I, [place] N Long [place], the attending physician, personally viewed and interpreted this ECG.   Date: 03/04/2018  EKG Time: 3:22 AM  Rate: 87  Rhythm: Normal sinus rhythm  Axis: Normal  Intervals: Normal  ST&T Change: None   Procedures   ____________________________________________   INITIAL IMPRESSION / ASSESSMENT AND PLAN / ED COURSE  As part of my medical decision making, I reviewed the following data within the electronic MEDICAL RECORD NUMBER   52 year old female presenting with above-stated history and physical exam secondary to "vertigo attack" resultant fall with injury of the right great toe and right knee.  X-rays revealed no evidence of fracture dislocation.  Patient requested pain medication and as such was given hydrocodone in  the emergency department.  Patient advised to follow-up with chronic pain physician Dr. Metta Clines ____________________________________________  FINAL CLINICAL IMPRESSION(S) / ED DIAGNOSES  Final diagnoses:  Contusion of right great toe with damage to nail, initial encounter     MEDICATIONS GIVEN DURING THIS VISIT:  Medications  HYDROcodone-acetaminophen (NORCO/VICODIN) 5-325 MG per tablet 2 tablet (2 tablets Oral Given 03/04/18 1610)     ED Discharge Orders         Ordered  meclizine (MEDI-MECLIZINE) 25 MG tablet  3 times daily PRN     03/04/18 16100629           Note:  This document was prepared using Dragon voice recognition software and may include unintentional dictation errors.    Darci CurrentBrown, [place] N, MD 03/04/18 2254    Darci CurrentBrown, [place] N, MD 03/27/18 2251

## 2018-03-04 NOTE — ED Triage Notes (Signed)
Pt states she was lying in bed, became dizzy, got up to use restroom, fell hitting bookcase. Pt complains of right knee and right great toe pain. Pt states she remains dizzy.

## 2018-03-04 NOTE — ED Notes (Signed)
Pt taken to bathroom in triage via wheelchair to void; was able to stand unassisted to get to and from the toilet; back out to waiting room; given a few ice chips for c/o dry mouth

## 2018-03-11 ENCOUNTER — Other Ambulatory Visit: Payer: Self-pay | Admitting: Family Medicine

## 2018-03-11 DIAGNOSIS — R4182 Altered mental status, unspecified: Secondary | ICD-10-CM

## 2018-03-20 ENCOUNTER — Ambulatory Visit
Admission: RE | Admit: 2018-03-20 | Discharge: 2018-03-20 | Disposition: A | Discharge status: Home / Self Care | Payer: Medicare HMO | Source: Ambulatory Visit | Attending: Family Medicine | Admitting: Family Medicine

## 2018-03-20 DIAGNOSIS — R4182 Altered mental status, unspecified: Secondary | ICD-10-CM | POA: Insufficient documentation

## 2018-03-25 ENCOUNTER — Ambulatory Visit
Admission: RE | Admit: 2018-03-25 | Discharge: 2018-03-25 | Disposition: A | Discharge status: Home / Self Care | Payer: Medicare HMO | Source: Ambulatory Visit | Attending: Obstetrics & Gynecology | Admitting: Obstetrics & Gynecology

## 2018-03-25 DIAGNOSIS — Z1231 Encounter for screening mammogram for malignant neoplasm of breast: Secondary | ICD-10-CM | POA: Diagnosis present

## 2018-03-25 DIAGNOSIS — Z1239 Encounter for other screening for malignant neoplasm of breast: Secondary | ICD-10-CM

## 2019-03-12 ENCOUNTER — Other Ambulatory Visit: Payer: Self-pay

## 2019-03-12 ENCOUNTER — Ambulatory Visit
Admission: RE | Admit: 2019-03-12 | Discharge: 2019-03-12 | Disposition: A | Discharge status: Home / Self Care | Payer: Medicare Other | Source: Ambulatory Visit | Attending: Family Medicine | Admitting: Family Medicine

## 2019-03-12 ENCOUNTER — Other Ambulatory Visit: Payer: Self-pay | Admitting: Family Medicine

## 2019-03-12 ENCOUNTER — Ambulatory Visit
Admission: RE | Admit: 2019-03-12 | Discharge: 2019-03-12 | Disposition: A | Discharge status: Home / Self Care | Payer: Medicare Other | Attending: Family Medicine | Admitting: Family Medicine

## 2019-03-12 DIAGNOSIS — R52 Pain, unspecified: Secondary | ICD-10-CM

## 2019-03-27 ENCOUNTER — Other Ambulatory Visit: Payer: Self-pay

## 2019-03-27 ENCOUNTER — Ambulatory Visit
Admission: RE | Admit: 2019-03-27 | Discharge: 2019-03-27 | Disposition: A | Discharge status: Home / Self Care | Payer: Medicare Other | Attending: Family Medicine | Admitting: Family Medicine

## 2019-03-27 ENCOUNTER — Ambulatory Visit
Admission: RE | Admit: 2019-03-27 | Discharge: 2019-03-27 | Disposition: A | Discharge status: Home / Self Care | Payer: Medicare Other | Source: Ambulatory Visit | Attending: Family Medicine | Admitting: Family Medicine

## 2019-03-27 ENCOUNTER — Other Ambulatory Visit: Payer: Self-pay | Admitting: Family Medicine

## 2019-03-27 DIAGNOSIS — S40012A Contusion of left shoulder, initial encounter: Secondary | ICD-10-CM

## 2019-04-02 ENCOUNTER — Other Ambulatory Visit: Payer: Self-pay

## 2019-04-02 ENCOUNTER — Ambulatory Visit: Payer: Medicare Other | Attending: Family Medicine | Admitting: Physical Therapy

## 2019-04-02 DIAGNOSIS — M25512 Pain in left shoulder: Secondary | ICD-10-CM | POA: Diagnosis present

## 2019-04-02 DIAGNOSIS — R29898 Other symptoms and signs involving the musculoskeletal system: Secondary | ICD-10-CM | POA: Insufficient documentation

## 2019-04-02 DIAGNOSIS — M25619 Stiffness of unspecified shoulder, not elsewhere classified: Secondary | ICD-10-CM | POA: Diagnosis present

## 2019-04-02 DIAGNOSIS — M6281 Muscle weakness (generalized): Secondary | ICD-10-CM | POA: Diagnosis present

## 2019-04-02 NOTE — Therapy (Signed)
[facility] [address] [place], [place], [address] Phone: [phone]   Fax:  [phone]  Physical Therapy Evaluation  Patient Details  Name: Mariah Reeves MRN: [id number] Date of Birth: January 29, 1966 Referring Provider (PT): Amie Portland, MD   Encounter Date: 04/02/2019  PT End of Session - 04/03/19 2016    Visit Number  1    Number of Visits  12    Date for PT Re-Evaluation  05/14/19    Authorization - Visit Number  1    Authorization - Number of Visits  10    PT Start Time  1647    PT Stop Time  1745    PT Time Calculation (min)  58 min    Activity Tolerance  Patient tolerated treatment well;Patient limited by pain    Behavior During Therapy  [facility] for tasks assessed/performed       Past Medical History:  Diagnosis Date  . Allergy   . Anomaly, uterus    tumor of uterus  . Anxiety   . Bilateral occipital neuralgia   . Bipolar 1 disorder (HCC)   . Chronic back pain   . Degenerative disc disease, lumbar   . Depression   . Facet syndrome, lumbar   . Headache    Migraines  . History of kidney stones    Left  . Hx of vertigo   . Nephrolithiasis 01/26/2017  . Patella fracture    Left  . Sleep apnea    C-PAP    Past Surgical History:  Procedure Laterality Date  . ABDOMINOPLASTY  2006  . CARPAL TUNNEL RELEASE Left   . CHOLECYSTECTOMY    . CYSTOSCOPY W/ URETERAL STENT PLACEMENT  01/03/2017   Procedure: CYSTOSCOPY WITH RETROGRADE PYELOGRAM/URETERAL STENT PLACEMENT;  Surgeon: Hildred Laser, MD;  Location: [facility] ORS;  Service: Urology;;  . Bluford Kaufmann W/ URETERAL STENT PLACEMENT Left 01/17/2017   Procedure: CYSTOSCOPY WITH STENT REPLACEMENT;  Surgeon: Hildred Laser, MD;  Location: [facility] OR[facility];  Service: Urology;  Laterality: Left;  . DILATATION & CURETTAGE/HYSTEROSCOPY WITH MYOSURE N/A 07/08/2015   Procedure: DILATATION & CURETTAGE/HYSTEROSCOPY WITH MYOSURE;  Surgeon: Nadara Mustard, MD;  Location: [facility] ORS;   Service: Gynecology;  Laterality: N/A;  . DILATION AND CURETTAGE OF UTERUS     1/17  . EXTRACORPOREAL SHOCK WAVE LITHOTRIPSY Left 12/07/2016   Procedure: EXTRACORPOREAL SHOCK WAVE LITHOTRIPSY (ESWL);  Surgeon: Hildred Laser, MD;  Location: [facility] OR[facility];  Service: Urology;  Laterality: Left;  Marland Kitchen GASTRIC BYPASS    . KNEE SURGERY    . LITHOTRIPSY    . MIDDLE EAR SURGERY    . NASAL SEPTUM SURGERY    . URETEROSCOPY WITH HOLMIUM LASER LITHOTRIPSY     perforated ureter. had nephrostomy tube for brief time  . URETEROSCOPY WITH HOLMIUM LASER LITHOTRIPSY Left 01/17/2017   Procedure: URETEROSCOPY WITH HOLMIUM LASER LITHOTRIPSY;  Surgeon: Hildred Laser, MD;  Location: [facility] ORS;  Service: Urology;  Laterality: Left;    There were no vitals filed for this visit.   Subjective Assessment - 04/03/19 1951    Subjective  Pt reports current L shoulder pain 7/10 (best:4/10, worst: 10/10)    Pertinent History  Pt is a pleasant 53 y.o. female with c/c of L shoulder pain s/p fall down steps onto L shoulder 3 weeks ago.  No fx seen on imaging; no N/T, pain radiates to L upper arm/ elbow.  Pt has no hx of prior shoulder injury/ pain.  Pt currently does not have follow-up appointment scheduled with MD.  PMH: anxiety, depression, headaches/ migraines, DDD    Limitations  Lifting;Other (comment)   reaching, lifting   Patient Stated Goals  Work/ ADLs without pain    Currently in Pain?  Yes    Pain Score  7     Pain Location  Shoulder    Pain Orientation  Left    Pain Descriptors / Indicators  Aching    Pain Type  Acute pain    Pain Radiating Towards  L upper arm/ elbow    Pain Onset  1 to 4 weeks ago    Pain Frequency  Constant    Aggravating Factors   Lifting (ie gallon milk, grocery bag), reaching any direction (ie. seatbelt, covers) - worst 10/10    Pain Relieving Factors  Hot shower - best: 4/10    Effect of Pain on Daily Activities  better in morning, worse as day goes on          SUBJECTIVE Chief complaint: L shoulder pain Onset: Fall 3 weeks ago Shoulder trauma: Yes, fall on wet stairs with laundry basket Pain quality:  Pain: 7/10 Present, 4/10 Best, 10/10 Worst Aggravating factors: Lifting gallon milk/ grocery bag, reaching any direction (ie. seatbelt, covers) Easing factors: Hot shower  24 hour pain behavior: Better in morning, worse as day goes on Radiating pain: Upper arm to elbow Numbness/Tingling: No Prior history of shoulder injury or pain: No Prior history of therapy for shoulder: No Follow-up appointment with MD: As needed Dominant hand: Right  OBJECTIVE  MUSCULOSKELETAL: Tremor: Normal Bulk: Normal Tone: Normal  Cervical Screen AROM: WFL and painless  Elbow Screen Elbow AROM: WNL and painless  Palpation TTP lateral L shoulder  Strength Not tested 2/2 pain  Grip Strength: R 27.2, L 26.5  AROM R WNL and painless L unable to measure, pt fearful of pain with AROM  PROM R WNL L: 145* Shoulder flexion 120* Shoulder abduction 60* Shoulder external rotation 50* Shoulder internal rotation Shoulder extension* not tested *Indicates pain, overpressure performed unless otherwise indicated  Accessory Motions/Glides Glenohumeral: Posterior: L Normal Inferior: L Normal Anterior: L Normal  NEUROLOGICAL:  Mental Status Patient is oriented to person, place and time.  Recent memory is intact.  Remote memory is intact.  Attention span and concentration are intact.  Expressive speech is intact.  Patient's fund of knowledge is within normal limits for educational level.  Cranial Nerves Not tested, no reported concerns  Sensation Deferred  Reflexes Deferred  Coordination/Cerebellar Deferred  SPECIAL TESTS  Subacromial Impingement Hawkins-Kennedy: Positive Painful Arc: Positive Empty Can: Positive External Rotation Resistance: Negative  Outcome Measures FOTO: 29 (norm 11)   ASSESSMENT Pt is a pleasant 53  year-old female referred for L shoulder pain. PT examination reveals deficits in L UE AROM and strength with significant fearful guarding. Pt will benefit from PT services to address deficits in strength, mobility, and pain in order to return to full function at home and work with less shoulder pain.     Objective measurements completed on examination: See above findings.      See HEP   PT Education - 04/03/19 1950    Education Details  Pt educated on HEP    Person(s) Educated  Patient    Methods  Explanation;Demonstration    Comprehension  Verbalized understanding;Returned demonstration          PT Long Term Goals - 04/03/19 2009      PT LONG TERM  GOAL #1   Title  Pt will score at least 57 on FOTO to demonstrate increased functional mobility.    Baseline  FOTO 29 (04/03/2019)    Time  6    Period  Weeks    Status  New    Target Date  05/14/19      PT LONG TERM GOAL #2   Title  Pt will decrease worst pain as reported on NPRS by at least 3 points in order to demonstrate clinically significant reduction in pain.    Baseline  Worst pain 10/10 (04/03/2019)    Time  6    Period  Weeks    Status  New    Target Date  05/14/19      PT LONG TERM GOAL #3   Title  Pt will demonstrate L shoulder AROM of at least 150 for increased mobility.    Baseline  Unable to measure L AROM 2/2 fear of pain, flexion PROM 145 deg (04/03/2019)    Time  6    Period  Weeks    Status  New    Target Date  05/14/19      PT LONG TERM GOAL #4   Title  Pt will report no pain with lifting a gallon of milk to improve function with ADLs.    Baseline  Pain with lifting gallon of milk and grocery bags (04/03/2019)    Time  6    Period  Weeks    Status  New    Target Date  05/14/19             Plan - 04/03/19 1941    Clinical Impression Statement  Pt is a pleasant 53 year-old female referred for L shoulder pain. PT examination reveals deficits in L UE A/PROM and strength with significant  fearful guarding. Pt will benefit from PT services to address deficits in strength, mobility, and pain in order to return to full function at home and work with less shoulder pain.    Examination-Activity Limitations  Bed Mobility;Bathing;Reach Overhead;Carry;Dressing;Sleep;Lift    Stability/Clinical Decision Making  Stable/Uncomplicated    Clinical Decision Making  Low    Rehab Potential  Good    PT Frequency  2x / week    PT Duration  6 weeks    PT Treatment/Interventions  ADLs/Self Care Home Management;Cryotherapy;Electrical Stimulation;Moist Heat;Therapeutic exercise;Therapeutic activities;Functional mobility training;Neuromuscular re-education;Patient/family education;Manual techniques;Passive range of motion    PT Next Visit Plan  AAROM, scapular strengething, isometrics    PT Home Exercise Plan  [email or web address]    Consulted and Agree with Plan of Care  Patient       Patient will benefit from skilled therapeutic intervention in order to improve the following deficits and impairments:  Improper body mechanics, Pain, Decreased coordination, Decreased mobility, Decreased activity tolerance, Decreased endurance, Decreased range of motion, Decreased strength, Impaired UE functional use, Impaired perceived functional ability  Visit Diagnosis: Acute pain of left shoulder  Limited range of motion of shoulder  Weakness of shoulder     Problem List Patient Active Problem List   Diagnosis Date Noted  . Nephrolithiasis 01/26/2017  . Left ureteral stone 01/03/2017  . Endometrial disorder 07/08/2015  . Bilateral occipital neuralgia 03/02/2015  . DDD (degenerative disc disease), lumbar 12/24/2014  . Facet syndrome, lumbar 12/24/2014  . Sacroiliac joint dysfunction 12/24/2014   Cammie Mcgee, PT, DPT # [id number] Ricarda Frame, SPT 04/04/2019, 8:01 AM  [facility] [address]  [place], [place], [address] Phone: [phone]   Fax:   [phone]  Name: Vergia AlbertsJoy A Larch MRN: [id number] Date of Birth: 26-Jul-1965

## 2019-04-03 ENCOUNTER — Encounter: Payer: Self-pay | Admitting: Physical Therapy

## 2019-04-03 NOTE — Patient Instructions (Signed)
Access Code: [email or web address]  URL: [email or web address]  Date: 04/02/2019  Prepared by: Dorcas Carrow   Exercises  Seated Shoulder Flexion AAROM with Pulley Behind- 20 reps- 2 sets- 1x daily- 7x weekly  Seated Shoulder Abduction AAROM with Pulley Behind- 20 reps- 2 sets- 1x daily- 7x weekly  Seated Scapular Retraction- 15 reps- 2 sets- 1x daily- 7x weekly  Supine Shoulder Press AAROM in Abduction with Dowel- 10 reps- 3 sets- 1x daily- 7x weekly

## 2019-04-07 ENCOUNTER — Encounter: Payer: Self-pay | Admitting: Physical Therapy

## 2019-04-07 ENCOUNTER — Ambulatory Visit: Payer: Medicare Other

## 2019-04-07 ENCOUNTER — Other Ambulatory Visit: Payer: Self-pay

## 2019-04-07 DIAGNOSIS — R29898 Other symptoms and signs involving the musculoskeletal system: Secondary | ICD-10-CM

## 2019-04-07 DIAGNOSIS — M25619 Stiffness of unspecified shoulder, not elsewhere classified: Secondary | ICD-10-CM

## 2019-04-07 DIAGNOSIS — M25512 Pain in left shoulder: Secondary | ICD-10-CM

## 2019-04-07 NOTE — Patient Instructions (Signed)
Access Code: [email or web address]  URL: [email or web address]  Date: 04/07/2019  Prepared by: Lieutenant Diego   Exercises  Supine Shoulder Press with Dowel - 20 reps - 1 sets - 2x daily - 7x weekly  Supine Shoulder Flexion Extension AAROM with Dowel - 20 reps - 1 sets - 2x daily - 7x weekly  Supine Shoulder Abduction AAROM with Dowel - 20 reps - 1 sets - 2x daily - 7x weekly  Supine Single Arm Shoulder Protraction - 20 reps - 1 sets - 1x daily - 7x weekly  Seated Shoulder Flexion AAROM with Dowel - 20 reps - 1 sets - 1x daily - 7x weekly  Seated Shoulder Abduction AAROM with Dowel - 20 reps - 1 sets - 1x daily - 7x weekly  Seated Scapular Retraction - 20 reps - 1 sets - 1x daily - 7x weekly  Seated Shoulder Flexion Towel Slide at Table Top - 10 reps - 2 sets - 1x daily - 7x weekly  Seated Shoulder Abduction Towel Slide at Table Top - 10 reps - 2 sets - 1x daily - 7x weekly

## 2019-04-07 NOTE — Therapy (Signed)
[facility] [address] [place], [place], [address] Phone: [phone]   Fax:  [phone]  Physical Therapy Treatment  Patient Details  Name: Mariah Reeves MRN: [id number] Date of Birth: 1966-01-15 Referring Provider (PT): Amie Portland, MD   Encounter Date: 04/07/2019  PT End of Session - 04/07/19 1543    Visit Number  2    Number of Visits  12    Date for PT Re-Evaluation  05/14/19    Authorization - Visit Number  2    Authorization - Number of Visits  10    PT Start Time  1432    PT Stop Time  1520    PT Time Calculation (min)  48 min    Activity Tolerance  Patient tolerated treatment well;Patient limited by pain    Behavior During Therapy  [facility] for tasks assessed/performed       Past Medical History:  Diagnosis Date  . Allergy   . Anomaly, uterus    tumor of uterus  . Anxiety   . Bilateral occipital neuralgia   . Bipolar 1 disorder (HCC)   . Chronic back pain   . Degenerative disc disease, lumbar   . Depression   . Facet syndrome, lumbar   . Headache    Migraines  . History of kidney stones    Left  . Hx of vertigo   . Nephrolithiasis 01/26/2017  . Patella fracture    Left  . Sleep apnea    C-PAP    Past Surgical History:  Procedure Laterality Date  . ABDOMINOPLASTY  2006  . CARPAL TUNNEL RELEASE Left   . CHOLECYSTECTOMY    . CYSTOSCOPY W/ URETERAL STENT PLACEMENT  01/03/2017   Procedure: CYSTOSCOPY WITH RETROGRADE PYELOGRAM/URETERAL STENT PLACEMENT;  Surgeon: Hildred Laser, MD;  Location: [facility];  Service: Urology;;  . Bluford Kaufmann W/ URETERAL STENT PLACEMENT Left 01/17/2017   Procedure: CYSTOSCOPY WITH STENT REPLACEMENT;  Surgeon: Hildred Laser, MD;  Location: [facility];  Service: Urology;  Laterality: Left;  . DILATATION & CURETTAGE/HYSTEROSCOPY WITH MYOSURE N/A 07/08/2015   Procedure: DILATATION & CURETTAGE/HYSTEROSCOPY WITH MYOSURE;  Surgeon: Nadara Mustard, MD;  Location: [facility];  Service:  Gynecology;  Laterality: N/A;  . DILATION AND CURETTAGE OF UTERUS     1/17  . EXTRACORPOREAL SHOCK WAVE LITHOTRIPSY Left 12/07/2016   Procedure: EXTRACORPOREAL SHOCK WAVE LITHOTRIPSY (ESWL);  Surgeon: Hildred Laser, MD;  Location: [facility];  Service: Urology;  Laterality: Left;  Marland Kitchen GASTRIC BYPASS    . KNEE SURGERY    . LITHOTRIPSY    . MIDDLE EAR SURGERY    . NASAL SEPTUM SURGERY    . URETEROSCOPY WITH HOLMIUM LASER LITHOTRIPSY     perforated ureter. had nephrostomy tube for brief time  . URETEROSCOPY WITH HOLMIUM LASER LITHOTRIPSY Left 01/17/2017   Procedure: URETEROSCOPY WITH HOLMIUM LASER LITHOTRIPSY;  Surgeon: Hildred Laser, MD;  Location: [facility];  Service: Urology;  Laterality: Left;    There were no vitals filed for this visit.  Subjective Assessment - 04/07/19 1438    Subjective  Pt reports current L shoulder pain 6/10. Pt reports doing HEP, except for seated scapular retractions.    Pertinent History  Pt is a pleasant 53 y.o. female with c/c of L shoulder pain s/p fall down steps onto L shoulder 3 weeks ago.  No fx seen on imaging; no N/T, pain radiates to L upper arm/ elbow.  Pt has no hx of  prior shoulder injury/ pain.  Pt currently does not have follow-up appointment scheduled with MD.  PMH: anxiety, depression, headaches/ migraines, DDD    Limitations  Lifting;Other (comment)   reaching, lifting   Patient Stated Goals  Work/ ADLs without pain    Currently in Pain?  Yes    Pain Score  6     Pain Location  Shoulder    Pain Orientation  Left    Pain Descriptors / Indicators  Aching    Pain Onset  1 to 4 weeks ago       TREATMENT:  Therapeutic Tx: Seated ball flexion - 1x10 Seated ball scaption - 1x10 Seated ball semi circles - 1x10 Pulleys flexion/scaption/abduction - 1x20  Supine L UE serratus punch - 1x10 Supine isometrics: flexion, extension - discontinued 2/2 pain PROM flexion with distraction - x3 (oscillations through range for relaxation) PROM  abduction with elbow flexed at 90 - x3 (oscillations through range for relaxation) PROM ER/ IR - x3   Manual Therapy: STM to upper traps, deltoids, supraspinatus - x5 min (pt states pain improves with STM)      PT Long Term Goals - 04/03/19 2009      PT LONG TERM GOAL #1   Title  Pt will score at least 57 on FOTO to demonstrate increased functional mobility.    Baseline  FOTO 29 (04/03/2019)    Time  6    Period  Weeks    Status  New    Target Date  05/14/19      PT LONG TERM GOAL #2   Title  Pt will decrease worst pain as reported on NPRS by at least 3 points in order to demonstrate clinically significant reduction in pain.    Baseline  Worst pain 10/10 (04/03/2019)    Time  6    Period  Weeks    Status  New    Target Date  05/14/19      PT LONG TERM GOAL #3   Title  Pt will demonstrate L shoulder AROM of at least 150 for increased mobility.    Baseline  Unable to measure L AROM 2/2 fear of pain, flexion PROM 145 deg (04/03/2019)    Time  6    Period  Weeks    Status  New    Target Date  05/14/19      PT LONG TERM GOAL #4   Title  Pt will report no pain with lifting a gallon of milk to improve function with ADLs.    Baseline  Pain with lifting gallon of milk and grocery bags (04/03/2019)    Time  6    Period  Weeks    Status  New    Target Date  05/14/19            Plan - 04/07/19 1529    Clinical Impression Statement  Pt pain limited today. Pt able to perform all AAROM activities, but stated that her shoulder was getting "irritated" and was more painful with eccentric motions.  PROM was also painful, especially with abduction; pain reduced with distraction and small oscillations for relaxation.  Supine shoulder isometrics painful and discontinued; will futher assess next visit.  Pt will benefit from further skilled therapy to increase pain-free ROM for ease with ADLs and functional mobility.    Examination-Activity Limitations  Bed Mobility;Bathing;Reach  Overhead;Carry;Dressing;Sleep;Lift    Stability/Clinical Decision Making  Stable/Uncomplicated    Clinical Decision Making  Low    Rehab Potential  Good    PT Frequency  2x / week    PT Duration  6 weeks    PT Treatment/Interventions  ADLs/Self Care Home Management;Cryotherapy;Electrical Stimulation;Moist Heat;Therapeutic exercise;Therapeutic activities;Functional mobility training;Neuromuscular re-education;Patient/family education;Manual techniques;Passive range of motion    PT Next Visit Plan  Continue AAROM, scapular strengething, isometrics    PT Home Exercise Plan  [email or web address]    Consulted and Agree with Plan of Care  Patient       Patient will benefit from skilled therapeutic intervention in order to improve the following deficits and impairments:  Improper body mechanics, Pain, Decreased coordination, Decreased mobility, Decreased activity tolerance, Decreased endurance, Decreased range of motion, Decreased strength, Impaired UE functional use, Impaired perceived functional ability  Visit Diagnosis: Acute pain of left shoulder  Limited range of motion of shoulder  Weakness of shoulder     Problem List Patient Active Problem List   Diagnosis Date Noted  . Nephrolithiasis 01/26/2017  . Left ureteral stone 01/03/2017  . Endometrial disorder 07/08/2015  . Bilateral occipital neuralgia 03/02/2015  . DDD (degenerative disc disease), lumbar 12/24/2014  . Facet syndrome, lumbar 12/24/2014  . Sacroiliac joint dysfunction 12/24/2014    Ricarda FrameLiana Priya Matsen, SPT 04/07/2019, 3:45 PM  [facility] [address] [place], [place], [address] Phone: [phone]   Fax:  [phone]  Name: Mariah Reeves MRN: [id number] Date of Birth: 1966/03/14

## 2019-04-10 ENCOUNTER — Encounter: Payer: Self-pay | Admitting: Physical Therapy

## 2019-04-10 ENCOUNTER — Other Ambulatory Visit: Payer: Self-pay

## 2019-04-10 ENCOUNTER — Ambulatory Visit: Payer: Medicare Other

## 2019-04-10 DIAGNOSIS — M25619 Stiffness of unspecified shoulder, not elsewhere classified: Secondary | ICD-10-CM

## 2019-04-10 DIAGNOSIS — R29898 Other symptoms and signs involving the musculoskeletal system: Secondary | ICD-10-CM

## 2019-04-10 DIAGNOSIS — M6281 Muscle weakness (generalized): Secondary | ICD-10-CM

## 2019-04-10 DIAGNOSIS — M25512 Pain in left shoulder: Secondary | ICD-10-CM

## 2019-04-10 NOTE — Patient Instructions (Signed)
Access Code: [email or web address]  URL: [email or web address]  Date: 04/10/2019  Prepared by: Lieutenant Diego   Exercises Seated Shoulder Flexion AAROM with Pulley Behind - 20 reps - 2 sets - 1x daily - 7x weekly Seated Shoulder Abduction AAROM with Pulley Behind - 20 reps - 2 sets - 1x daily - 7x weekly Seated Scapular Retraction - 15 reps - 2 sets - 1x daily - 7x weekly Supine Shoulder Press AAROM in Abduction with Dowel - 10 reps                   - 3 sets - 1x daily - 7x weekly Isometric Shoulder Flexion at Wall - 10 reps - 1 sets - 5 sec hold - 1x daily - 7x weekly Isometric Shoulder Extension at Wall - 10 reps - 1 sets - 5 sec hold - 1x daily - 7x weekly Isometric Shoulder External Rotation at Wall - 10 reps - 1 sets - 5 sec hold - 1x daily - 7x weekly Standing Isometric Shoulder Internal Rotation at Doorway - 10 reps - 1 sets - 5 sec hold - 1x daily - 7x weekly Isometric Shoulder Abduction at Wall - 10 reps - 1 sets - 5 sec hold - 1x daily - 7x weekly

## 2019-04-10 NOTE — Therapy (Signed)
[place] [facility] [address] [place], [place], [address] Phone: [phone]   Fax:  [phone]  Physical Therapy Treatment  Patient Details  Name: Mariah Reeves MRN: [id number] Date of Birth: 1966-03-06 Referring Provider (PT): Amie Portlandavid Ormond, MD   Encounter Date: 04/10/2019  PT End of Session - 04/10/19 1011    Visit Number  3    Number of Visits  12    Date for PT Re-Evaluation  05/14/19    Authorization - Visit Number  3    Authorization - Number of Visits  10    PT Start Time  1003    PT Stop Time  1051    PT Time Calculation (min)  48 min    Activity Tolerance  Patient tolerated treatment well;Patient limited by pain    Behavior During Therapy  [facility] for tasks assessed/performed       Past Medical History:  Diagnosis Date  . Allergy   . Anomaly, uterus    tumor of uterus  . Anxiety   . Bilateral occipital neuralgia   . Bipolar 1 disorder (HCC)   . Chronic back pain   . Degenerative disc disease, lumbar   . Depression   . Facet syndrome, lumbar   . Headache    Migraines  . History of kidney stones    Left  . Hx of vertigo   . Nephrolithiasis 01/26/2017  . Patella fracture    Left  . Sleep apnea    C-PAP    Past Surgical History:  Procedure Laterality Date  . ABDOMINOPLASTY  2006  . CARPAL TUNNEL RELEASE Left   . CHOLECYSTECTOMY    . CYSTOSCOPY W/ URETERAL STENT PLACEMENT  01/03/2017   Procedure: CYSTOSCOPY WITH RETROGRADE PYELOGRAM/URETERAL STENT PLACEMENT;  Surgeon: Hildred LaserBudzyn, Brian James, MD;  Location: [facility];  Service: Urology;;  . Bluford KaufmannYSTOSCOPY W/ URETERAL STENT PLACEMENT Left 01/17/2017   Procedure: CYSTOSCOPY WITH STENT REPLACEMENT;  Surgeon: Hildred LaserBudzyn, Brian James, MD;  Location: [facility];  Service: Urology;  Laterality: Left;  . DILATATION & CURETTAGE/HYSTEROSCOPY WITH MYOSURE N/A 07/08/2015   Procedure: DILATATION & CURETTAGE/HYSTEROSCOPY WITH MYOSURE;  Surgeon: Nadara Mustardobert P Harris, MD;  Location: [facility];  Service:  Gynecology;  Laterality: N/A;  . DILATION AND CURETTAGE OF UTERUS     1/17  . EXTRACORPOREAL SHOCK WAVE LITHOTRIPSY Left 12/07/2016   Procedure: EXTRACORPOREAL SHOCK WAVE LITHOTRIPSY (ESWL);  Surgeon: Hildred LaserBudzyn, Brian James, MD;  Location: [facility];  Service: Urology;  Laterality: Left;  Marland Kitchen. GASTRIC BYPASS    . KNEE SURGERY    . LITHOTRIPSY    . MIDDLE EAR SURGERY    . NASAL SEPTUM SURGERY    . URETEROSCOPY WITH HOLMIUM LASER LITHOTRIPSY     perforated ureter. had nephrostomy tube for brief time  . URETEROSCOPY WITH HOLMIUM LASER LITHOTRIPSY Left 01/17/2017   Procedure: URETEROSCOPY WITH HOLMIUM LASER LITHOTRIPSY;  Surgeon: Hildred LaserBudzyn, Brian James, MD;  Location: [facility];  Service: Urology;  Laterality: Left;    There were no vitals filed for this visit.  Subjective Assessment - 04/10/19 1006    Subjective  Patient reported that she is doing her HEP, pain with abduction exercises, flexion exercises has improved pain.    Pertinent History  Pt is a pleasant 53 y.o. female with c/c of L shoulder pain s/p fall down steps onto L shoulder 3 weeks ago.  No fx seen on imaging; no N/T, pain radiates to L upper arm/ elbow.  Pt has no hx  of prior shoulder injury/ pain.  Pt currently does not have follow-up appointment scheduled with MD.  PMH: anxiety, depression, headaches/ migraines, DDD    Limitations  Lifting;Other (comment)    Patient Stated Goals  Work/ ADLs without pain    Currently in Pain?  Yes    Pain Score  5     Pain Location  Shoulder    Pain Orientation  Left    Pain Descriptors / Indicators  Aching    Pain Type  Acute pain    Pain Onset  1 to 4 weeks ago         TREATMENT:  Therapeutic Tx: Seated ball flexion - 1x15 Seated ball scaption - 1x15 Seated ball semi circles - 1x15 R shoulder isometrics - 3x5 second holds all directions, PT resistance, and then instructed with wall for 5 reps all directions x3sec holds Pulleys flexion/scaption/abduction - 1x20  PROM flexion with  distraction - x3 (oscillations through range for relaxation) PROM abduction with elbow flexed at 90 - x3 (oscillations through range for relaxation) PROM ER/ IR - x3   Ice x14mins  Manual Therapy: STM to upper traps, lateral deltoid x5 mins  Pt response clinical impression: The patient reportedincrease in pain symptoms to 8/10 with exercises this session. Pt educated, instructed in, and demonstrated isometric exercises for L shoulder, cued for submax contraction and added to HEP. Pt reported "popping" with AAROM abduction. Pt iced at end of session to address elevated pain. Overall the patient continue to demonstrate limitations in ROM, strength, endurance, and functional abilities and would  benefit from further skilled PT intervention.     PT Education - 04/10/19 1008    Education Details  Exercise technique/form    Person(s) Educated  Patient    Methods  Explanation;Demonstration;Tactile cues;Verbal cues    Comprehension  Verbalized understanding;Returned demonstration;Tactile cues required;Need further instruction          PT Long Term Goals - 04/03/19 2009      PT LONG TERM GOAL #1   Title  Pt will score at least 57 on FOTO to demonstrate increased functional mobility.    Baseline  FOTO 29 (04/03/2019)    Time  6    Period  Weeks    Status  New    Target Date  05/14/19      PT LONG TERM GOAL #2   Title  Pt will decrease worst pain as reported on NPRS by at least 3 points in order to demonstrate clinically significant reduction in pain.    Baseline  Worst pain 10/10 (04/03/2019)    Time  6    Period  Weeks    Status  New    Target Date  05/14/19      PT LONG TERM GOAL #3   Title  Pt will demonstrate L shoulder AROM of at least 150 for increased mobility.    Baseline  Unable to measure L AROM 2/2 fear of pain, flexion PROM 145 deg (04/03/2019)    Time  6    Period  Weeks    Status  New    Target Date  05/14/19      PT LONG TERM GOAL #4   Title  Pt will report  no pain with lifting a gallon of milk to improve function with ADLs.    Baseline  Pain with lifting gallon of milk and grocery bags (04/03/2019)    Time  6    Period  Weeks    Status  New    Target Date  05/14/19            Plan - 04/10/19 1010    Clinical Impression Statement  The patient reportedincrease in pain symptoms to 8/10 with exercises this session. Pt educated, instructed in, and demonstrated isometric exercises for L shoulder, cued for submax contraction and added to HEP. Pt reported "popping" with AAROM abduction. Pt iced at end of session to address elevated pain. Overall the patient continue to demonstrate limitations in ROM, strength, endurance, and functional abilities and would  benefit from further skilled PT intervention.    Examination-Activity Limitations  Bed Mobility;Bathing;Reach Overhead;Carry;Dressing;Sleep;Lift    Rehab Potential  Good    PT Frequency  2x / week    PT Duration  6 weeks    PT Treatment/Interventions  ADLs/Self Care Home Management;Cryotherapy;Electrical Stimulation;Moist Heat;Therapeutic exercise;Therapeutic activities;Functional mobility training;Neuromuscular re-education;Patient/family education;Manual techniques;Passive range of motion    PT Next Visit Plan  Continue AAROM, scapular strengething, isometrics    PT Home Exercise Plan  [email or web address]    Consulted and Agree with Plan of Care  Patient       Patient will benefit from skilled therapeutic intervention in order to improve the following deficits and impairments:  Improper body mechanics, Pain, Decreased coordination, Decreased mobility, Decreased activity tolerance, Decreased endurance, Decreased range of motion, Decreased strength, Impaired UE functional use, Impaired perceived functional ability  Visit Diagnosis: Acute pain of left shoulder  Limited range of motion of shoulder  Weakness of shoulder  Muscle weakness (generalized)     Problem List Patient Active Problem List    Diagnosis Date Noted  . Nephrolithiasis 01/26/2017  . Left ureteral stone 01/03/2017  . Endometrial disorder 07/08/2015  . Bilateral occipital neuralgia 03/02/2015  . DDD (degenerative disc disease), lumbar 12/24/2014  . Facet syndrome, lumbar 12/24/2014  . Sacroiliac joint dysfunction 12/24/2014    Olga Coaster PT, DPT 10:49 AM,04/10/19 [phone]  [facility] [address] [place], [place], [address] Phone: [phone]   Fax:  [phone]  Name: Mariah Reeves MRN: [id number] Date of Birth: [birth year]

## 2019-04-15 ENCOUNTER — Encounter: Payer: Self-pay | Admitting: Physical Therapy

## 2019-04-15 ENCOUNTER — Ambulatory Visit: Payer: Medicare Other | Attending: Family Medicine | Admitting: Physical Therapy

## 2019-04-15 ENCOUNTER — Other Ambulatory Visit: Payer: Self-pay

## 2019-04-15 DIAGNOSIS — M25619 Stiffness of unspecified shoulder, not elsewhere classified: Secondary | ICD-10-CM | POA: Diagnosis present

## 2019-04-15 DIAGNOSIS — M6281 Muscle weakness (generalized): Secondary | ICD-10-CM

## 2019-04-15 DIAGNOSIS — M25512 Pain in left shoulder: Secondary | ICD-10-CM | POA: Insufficient documentation

## 2019-04-15 DIAGNOSIS — R29898 Other symptoms and signs involving the musculoskeletal system: Secondary | ICD-10-CM | POA: Insufficient documentation

## 2019-04-15 NOTE — Therapy (Signed)
[facility] [address] [place], [place], [address] Phone: [phone]   Fax:  [phone]  Physical Therapy Treatment  Patient Details  Name: Mariah Reeves MRN: [id number] Date of Birth: June 30, 1965 Referring Provider (PT): Amie Portland, MD   Encounter Date: 04/15/2019  PT End of Session - 04/15/19 1216    Visit Number  4    Number of Visits  12    Date for PT Re-Evaluation  05/14/19    Authorization - Visit Number  4    Authorization - Number of Visits  10    PT Start Time  1017    PT Stop Time  1102    PT Time Calculation (min)  45 min    Activity Tolerance  Patient tolerated treatment well;Patient limited by pain    Behavior During Therapy  [facility] for tasks assessed/performed       Past Medical History:  Diagnosis Date  . Allergy   . Anomaly, uterus    tumor of uterus  . Anxiety   . Bilateral occipital neuralgia   . Bipolar 1 disorder (HCC)   . Chronic back pain   . Degenerative disc disease, lumbar   . Depression   . Facet syndrome, lumbar   . Headache    Migraines  . History of kidney stones    Left  . Hx of vertigo   . Nephrolithiasis 01/26/2017  . Patella fracture    Left  . Sleep apnea    C-PAP    Past Surgical History:  Procedure Laterality Date  . ABDOMINOPLASTY  2006  . CARPAL TUNNEL RELEASE Left   . CHOLECYSTECTOMY    . CYSTOSCOPY W/ URETERAL STENT PLACEMENT  01/03/2017   Procedure: CYSTOSCOPY WITH RETROGRADE PYELOGRAM/URETERAL STENT PLACEMENT;  Surgeon: Hildred Laser, MD;  Location: [facility];  Service: Urology;;  . Bluford Kaufmann W/ URETERAL STENT PLACEMENT Left 01/17/2017   Procedure: CYSTOSCOPY WITH STENT REPLACEMENT;  Surgeon: Hildred Laser, MD;  Location: [facility];  Service: Urology;  Laterality: Left;  . DILATATION & CURETTAGE/HYSTEROSCOPY WITH MYOSURE N/A 07/08/2015   Procedure: DILATATION & CURETTAGE/HYSTEROSCOPY WITH MYOSURE;  Surgeon: Nadara Mustard, MD;  Location: [facility];  Service:  Gynecology;  Laterality: N/A;  . DILATION AND CURETTAGE OF UTERUS     1/17  . EXTRACORPOREAL SHOCK WAVE LITHOTRIPSY Left 12/07/2016   Procedure: EXTRACORPOREAL SHOCK WAVE LITHOTRIPSY (ESWL);  Surgeon: Hildred Laser, MD;  Location: [facility];  Service: Urology;  Laterality: Left;  Marland Kitchen GASTRIC BYPASS    . KNEE SURGERY    . LITHOTRIPSY    . MIDDLE EAR SURGERY    . NASAL SEPTUM SURGERY    . URETEROSCOPY WITH HOLMIUM LASER LITHOTRIPSY     perforated ureter. had nephrostomy tube for brief time  . URETEROSCOPY WITH HOLMIUM LASER LITHOTRIPSY Left 01/17/2017   Procedure: URETEROSCOPY WITH HOLMIUM LASER LITHOTRIPSY;  Surgeon: Hildred Laser, MD;  Location: [facility];  Service: Urology;  Laterality: Left;    There were no vitals filed for this visit.  Subjective Assessment - 04/15/19 1029    Subjective  Pt. reports 6/10 L shoulder pain prior to tx. session.  Pt. states she is doing HEP but reports increase L sh. soreness with isometrics.  Pt. states a hot shower helps.    Pertinent History  Pt is a pleasant 53 y.o. female with c/c of L shoulder pain s/p fall down steps onto L shoulder 3 weeks ago.  No fx seen on  imaging; no N/T, pain radiates to L upper arm/ elbow.  Pt has no hx of prior shoulder injury/ pain.  Pt currently does not have follow-up appointment scheduled with MD.  PMH: anxiety, depression, headaches/ migraines, DDD    Limitations  Lifting;Other (comment)    Patient Stated Goals  Work/ ADLs without pain    Currently in Pain?  Yes    Pain Score  6     Pain Location  Shoulder    Pain Orientation  Left    Pain Descriptors / Indicators  Aching    Pain Onset  1 to 4 weeks ago       Manual tx.:  Supine L shoulder AA/PROM (all planes)- 10x each Supine AP/inf. Grade II-III mobs. 3x30 sec. (reassessment of AROM flexion).   Supine L shoulder LAD 3x with gentle oscillations STM to L UT/anterior and posterior deltoid/ proximal biceps 6 min. There.ex.:                                                                                                                                                                                                     Supine serratus punches 20x Standing L shoulder AROM (pain tolerable range) 5x  Supine manual isometrics (IR/ER/biceps/ triceps)- 5x 10 sec. (cuing to relax slowly to prevent an increase in pain) Reviewed HEP (pulley/ isometrics)   PT Long Term Goals - 04/03/19 2009      PT LONG TERM GOAL #1   Title  Pt will score at least 57 on FOTO to demonstrate increased functional mobility.    Baseline  FOTO 29 (04/03/2019)    Time  6    Period  Weeks    Status  New    Target Date  05/14/19      PT LONG TERM GOAL #2   Title  Pt will decrease worst pain as reported on NPRS by at least 3 points in order to demonstrate clinically significant reduction in pain.    Baseline  Worst pain 10/10 (04/03/2019)    Time  6    Period  Weeks    Status  New    Target Date  05/14/19      PT LONG TERM GOAL #3   Title  Pt will demonstrate L shoulder AROM of at least 150 for increased mobility.    Baseline  Unable to measure L AROM 2/2 fear of pain, flexion PROM 145 deg (04/03/2019)    Time  6    Period  Weeks    Status  New    Target Date  05/14/19  PT LONG TERM GOAL #4   Title  Pt will report no pain with lifting a gallon of milk to improve function with ADLs.    Baseline  Pain with lifting gallon of milk and grocery bags (04/03/2019)    Time  6    Period  Weeks    Status  New    Target Date  05/14/19        Plan - 04/15/19 1217    Clinical Impression Statement  Pt. had several "pops" in L shoulder during supine/ seated flexion and abduction.  Pt. hesitant with initial aspects of L shoulder flexion/ abduction but able to demonstrate functional shoulder ROM.  Persistent pain t/o tx. session with limited decrease in pain during grade II mobs./ distraction.  No change to HEP at this time.  Pt. educated to avoid any pain provoking isometrics  during HEP.    Examination-Activity Limitations  Bed Mobility;Bathing;Reach Overhead;Carry;Dressing;Sleep;Lift    Stability/Clinical Decision Making  Stable/Uncomplicated    Clinical Decision Making  Low    Rehab Potential  Good    PT Frequency  2x / week    PT Duration  6 weeks    PT Treatment/Interventions  ADLs/Self Care Home Management;Cryotherapy;Electrical Stimulation;Moist Heat;Therapeutic exercise;Therapeutic activities;Functional mobility training;Neuromuscular re-education;Patient/family education;Manual techniques;Passive range of motion    PT Next Visit Plan  Continue AAROM, scapular strengething, isometrics    PT Home Exercise Plan  [id number]    Consulted and Agree with Plan of Care  Patient       Patient will benefit from skilled therapeutic intervention in order to improve the following deficits and impairments:  Improper body mechanics, Pain, Decreased coordination, Decreased mobility, Decreased activity tolerance, Decreased endurance, Decreased range of motion, Decreased strength, Impaired UE functional use, Impaired perceived functional ability  Visit Diagnosis: Acute pain of left shoulder  Limited range of motion of shoulder  Weakness of shoulder  Muscle weakness (generalized)     Problem List Patient Active Problem List   Diagnosis Date Noted  . Nephrolithiasis 01/26/2017  . Left ureteral stone 01/03/2017  . Endometrial disorder 07/08/2015  . Bilateral occipital neuralgia 03/02/2015  . DDD (degenerative disc disease), lumbar 12/24/2014  . Facet syndrome, lumbar 12/24/2014  . Sacroiliac joint dysfunction 12/24/2014   Pura Spice, PT, DPT # [phone] 04/15/2019, 12:22 PM  [facility] [address] [place], [place], [address] Phone: [phone]   Fax:  [phone]  Name: Mariah Reeves MRN: [id number] Date of Birth: Dec 31, 1965

## 2019-04-17 ENCOUNTER — Encounter: Payer: Self-pay | Admitting: Physical Therapy

## 2019-04-17 ENCOUNTER — Other Ambulatory Visit: Payer: Self-pay

## 2019-04-17 ENCOUNTER — Ambulatory Visit: Payer: Medicare Other | Admitting: Physical Therapy

## 2019-04-17 DIAGNOSIS — M25512 Pain in left shoulder: Secondary | ICD-10-CM

## 2019-04-17 DIAGNOSIS — R29898 Other symptoms and signs involving the musculoskeletal system: Secondary | ICD-10-CM

## 2019-04-17 DIAGNOSIS — M25619 Stiffness of unspecified shoulder, not elsewhere classified: Secondary | ICD-10-CM

## 2019-04-17 DIAGNOSIS — M6281 Muscle weakness (generalized): Secondary | ICD-10-CM

## 2019-04-17 NOTE — Therapy (Signed)
[facility] [address] [place], [place], [address] Phone: [phone]   Fax:  [phone]  Physical Therapy Treatment  Patient Details  Name: LENISE JR MRN: [id number] Date of Birth: [birth year] Referring Provider (PT): Lajean Manes, MD   Encounter Date: 04/17/2019  PT End of Session - 04/17/19 1415    Visit Number  5    Number of Visits  12    Date for PT Re-Evaluation  05/14/19    Authorization - Visit Number  5    Authorization - Number of Visits  10    PT Start Time  1410    PT Stop Time  7741    PT Time Calculation (min)  47 min    Activity Tolerance  Patient tolerated treatment well;Patient limited by pain    Behavior During Therapy  [facility] for tasks assessed/performed       Past Medical History:  Diagnosis Date  . Allergy   . Anomaly, uterus    tumor of uterus  . Anxiety   . Bilateral occipital neuralgia   . Bipolar 1 disorder ([place])   . Chronic back pain   . Degenerative disc disease, lumbar   . Depression   . Facet syndrome, lumbar   . Headache    Migraines  . History of kidney stones    Left  . Hx of vertigo   . Nephrolithiasis 01/26/2017  . Patella fracture    Left  . Sleep apnea    C-PAP    Past Surgical History:  Procedure Laterality Date  . ABDOMINOPLASTY  2006  . CARPAL TUNNEL RELEASE Left   . CHOLECYSTECTOMY    . CYSTOSCOPY W/ URETERAL STENT PLACEMENT  01/03/2017   Procedure: CYSTOSCOPY WITH RETROGRADE PYELOGRAM/URETERAL STENT PLACEMENT;  Surgeon: Nickie Retort, MD;  Location: [facility];  Service: Urology;;  . Consuela Mimes W/ URETERAL STENT PLACEMENT Left 01/17/2017   Procedure: CYSTOSCOPY WITH STENT REPLACEMENT;  Surgeon: Nickie Retort, MD;  Location: [facility];  Service: Urology;  Laterality: Left;  . DILATATION & CURETTAGE/HYSTEROSCOPY WITH MYOSURE N/A 07/08/2015   Procedure: DILATATION & CURETTAGE/HYSTEROSCOPY WITH MYOSURE;  Surgeon: Gae Dry, MD;  Location: [facility];  Service:  Gynecology;  Laterality: N/A;  . DILATION AND CURETTAGE OF UTERUS     1/17  . EXTRACORPOREAL SHOCK WAVE LITHOTRIPSY Left 12/07/2016   Procedure: EXTRACORPOREAL SHOCK WAVE LITHOTRIPSY (ESWL);  Surgeon: Nickie Retort, MD;  Location: [facility];  Service: Urology;  Laterality: Left;  Marland Kitchen GASTRIC BYPASS    . KNEE SURGERY    . LITHOTRIPSY    . MIDDLE EAR SURGERY    . NASAL SEPTUM SURGERY    . URETEROSCOPY WITH HOLMIUM LASER LITHOTRIPSY     perforated ureter. had nephrostomy tube for brief time  . URETEROSCOPY WITH HOLMIUM LASER LITHOTRIPSY Left 01/17/2017   Procedure: URETEROSCOPY WITH HOLMIUM LASER LITHOTRIPSY;  Surgeon: Nickie Retort, MD;  Location: [facility];  Service: Urology;  Laterality: Left;    There were no vitals filed for this visit.  Subjective Assessment - 04/17/19 1412    Subjective  Pt. reports no pain in L shoulder at rest and 7/10 L sh. pain with increase movement (changing sheets/ laundry).    Pertinent History  Pt is a pleasant 53 y.o. female with c/c of L shoulder pain s/p fall down steps onto L shoulder 3 weeks ago.  No fx seen on imaging; no N/T, pain radiates to L upper arm/ elbow.  Pt  has no hx of prior shoulder injury/ pain.  Pt currently does not have follow-up appointment scheduled with MD.  PMH: anxiety, depression, headaches/ migraines, DDD    Limitations  Lifting;Other (comment)    Patient Stated Goals  Work/ ADLs without pain    Currently in Pain?  Yes    Pain Score  7     Pain Location  Shoulder    Pain Orientation  Left    Pain Onset  1 to 4 weeks ago        There.ex.:  Seated shoulder pulley ex.: flexion/ abduction (increase hold)- 20x each B UBE 3 min. F/b.  Standing B shoulder (wand): flexion/ chest press/ shoulder extension/ IR 20x each with mirror feedback for posture/ technique. Supine serratus punches (2#)/  tricep ext. (2#)/ bicep curls (2#)/ shoulder flexion (2#)- 30x. Supine L shoulder IR/ER isometrics (moderate resistance) 5x each.    Manual tx.:  Supine L shoulder AA/PROM (all planes)- 5x each with static holds Supine AP/inf. Grade II-III mobs. 3x30 sec. (increase sh. Flexion but pain remains)   Supine L shoulder LAD 3x with gentle oscillations STM to L UT/anterior and posterior deltoid/ proximal biceps 8 min.    PT Long Term Goals - 04/03/19 2009      PT LONG TERM GOAL #1   Title  Pt will score at least 57 on FOTO to demonstrate increased functional mobility.    Baseline  FOTO 29 (04/03/2019)    Time  6    Period  Weeks    Status  New    Target Date  05/14/19      PT LONG TERM GOAL #2   Title  Pt will decrease worst pain as reported on NPRS by at least 3 points in order to demonstrate clinically significant reduction in pain.    Baseline  Worst pain 10/10 (04/03/2019)    Time  6    Period  Weeks    Status  New    Target Date  05/14/19      PT LONG TERM GOAL #3   Title  Pt will demonstrate L shoulder AROM of at least 150 for increased mobility.    Baseline  Unable to measure L AROM 2/2 fear of pain, flexion PROM 145 deg (04/03/2019)    Time  6    Period  Weeks    Status  New    Target Date  05/14/19      PT LONG TERM GOAL #4   Title  Pt will report no pain with lifting a gallon of milk to improve function with ADLs.    Baseline  Pain with lifting gallon of milk and grocery bags (04/03/2019)    Time  6    Period  Weeks    Status  New    Target Date  05/14/19            Plan - 04/17/19 1415    Clinical Impression Statement  Pt. remains pain limited with shoulder flexion/ abduction (>100 deg.).  Pt. able to demonstrate full L shoulder functional motion in standing.  Pt. able to progress to use of 2# in supine position with proper technique.  Good L shoulder capsular mobility (all planes).    Examination-Activity Limitations  Bed Mobility;Bathing;Reach  Overhead;Carry;Dressing;Sleep;Lift    Stability/Clinical Decision Making  Stable/Uncomplicated    Clinical Decision Making  Moderate    Rehab Potential  Good    PT Frequency  2x / week    PT Duration  6 weeks    PT Treatment/Interventions  ADLs/Self Care Home Management;Cryotherapy;Electrical Stimulation;Moist Heat;Therapeutic exercise;Therapeutic activities;Functional mobility training;Neuromuscular re-education;Patient/family education;Manual techniques;Passive range of motion    PT Next Visit Plan  Continue AAROM, scapular strengething.  ISSUE RTB next tx. session.    PT Home Exercise Plan  [id number]    Consulted and Agree with Plan of Care  Patient       Patient will benefit from skilled therapeutic intervention in order to improve the following deficits and impairments:  Improper body mechanics, Pain, Decreased coordination, Decreased mobility, Decreased activity tolerance, Decreased endurance, Decreased range of motion, Decreased strength, Impaired UE functional use, Impaired perceived functional ability  Visit Diagnosis: Acute pain of left shoulder  Limited range of motion of shoulder  Weakness of shoulder  Muscle weakness (generalized)     Problem List Patient Active Problem List   Diagnosis Date Noted  . Nephrolithiasis 01/26/2017  . Left ureteral stone 01/03/2017  . Endometrial disorder 07/08/2015  . Bilateral occipital neuralgia 03/02/2015  . DDD (degenerative disc disease), lumbar 12/24/2014  . Facet syndrome, lumbar 12/24/2014  . Sacroiliac joint dysfunction 12/24/2014   Cammie McgeeMichael C Londen Bok, PT, DPT # [id number] 04/17/2019, 2:55 PM  [facility] [address] [place], [place], [address] Phone: [phone]   Fax:  [phone]  Name: Vergia AlbertsJoy A Gerardo MRN: [id number] Date of Birth: 1966-03-20

## 2019-04-22 ENCOUNTER — Encounter: Payer: Self-pay | Admitting: Physical Therapy

## 2019-04-22 ENCOUNTER — Ambulatory Visit: Payer: Medicare Other | Admitting: Physical Therapy

## 2019-04-22 ENCOUNTER — Other Ambulatory Visit: Payer: Self-pay

## 2019-04-22 DIAGNOSIS — M25619 Stiffness of unspecified shoulder, not elsewhere classified: Secondary | ICD-10-CM

## 2019-04-22 DIAGNOSIS — M25512 Pain in left shoulder: Secondary | ICD-10-CM | POA: Diagnosis not present

## 2019-04-22 DIAGNOSIS — R29898 Other symptoms and signs involving the musculoskeletal system: Secondary | ICD-10-CM

## 2019-04-22 DIAGNOSIS — M6281 Muscle weakness (generalized): Secondary | ICD-10-CM

## 2019-04-22 NOTE — Therapy (Signed)
[place] [facility] [address] [place], [place], [address] Phone: [phone]   Fax:  [phone]  Physical Therapy Treatment  Patient Details  Name: Mariah Reeves MRN: [id number] Date of Birth: 1966-03-07 Referring Provider (PT): Amie Portlandavid Ormond, MD   Encounter Date: 04/22/2019  PT End of Session - 04/23/19 1616    Visit Number  6    Number of Visits  12    Date for PT Re-Evaluation  05/14/19    Authorization - Visit Number  6    Authorization - Number of Visits  10    PT Start Time  1420    PT Stop Time  1507    PT Time Calculation (min)  47 min    Activity Tolerance  Patient tolerated treatment well;Patient limited by pain    Behavior During Therapy  [facility] for tasks assessed/performed       Past Medical History:  Diagnosis Date  . Allergy   . Anomaly, uterus    tumor of uterus  . Anxiety   . Bilateral occipital neuralgia   . Bipolar 1 disorder (HCC)   . Chronic back pain   . Degenerative disc disease, lumbar   . Depression   . Facet syndrome, lumbar   . Headache    Migraines  . History of kidney stones    Left  . Hx of vertigo   . Nephrolithiasis 01/26/2017  . Patella fracture    Left  . Sleep apnea    C-PAP    Past Surgical History:  Procedure Laterality Date  . ABDOMINOPLASTY  2006  . CARPAL TUNNEL RELEASE Left   . CHOLECYSTECTOMY    . CYSTOSCOPY W/ URETERAL STENT PLACEMENT  01/03/2017   Procedure: CYSTOSCOPY WITH RETROGRADE PYELOGRAM/URETERAL STENT PLACEMENT;  Surgeon: Hildred LaserBudzyn, Brian James, MD;  Location: [facility];  Service: Urology;;  . Bluford KaufmannYSTOSCOPY W/ URETERAL STENT PLACEMENT Left 01/17/2017   Procedure: CYSTOSCOPY WITH STENT REPLACEMENT;  Surgeon: Hildred LaserBudzyn, Brian James, MD;  Location: [facility];  Service: Urology;  Laterality: Left;  . DILATATION & CURETTAGE/HYSTEROSCOPY WITH MYOSURE N/A 07/08/2015   Procedure: DILATATION & CURETTAGE/HYSTEROSCOPY WITH MYOSURE;  Surgeon: Nadara Mustardobert P Harris, MD;  Location: [facility];  Service:  Gynecology;  Laterality: N/A;  . DILATION AND CURETTAGE OF UTERUS     1/17  . EXTRACORPOREAL SHOCK WAVE LITHOTRIPSY Left 12/07/2016   Procedure: EXTRACORPOREAL SHOCK WAVE LITHOTRIPSY (ESWL);  Surgeon: Hildred LaserBudzyn, Brian James, MD;  Location: [facility];  Service: Urology;  Laterality: Left;  Marland Kitchen. GASTRIC BYPASS    . KNEE SURGERY    . LITHOTRIPSY    . MIDDLE EAR SURGERY    . NASAL SEPTUM SURGERY    . URETEROSCOPY WITH HOLMIUM LASER LITHOTRIPSY     perforated ureter. had nephrostomy tube for brief time  . URETEROSCOPY WITH HOLMIUM LASER LITHOTRIPSY Left 01/17/2017   Procedure: URETEROSCOPY WITH HOLMIUM LASER LITHOTRIPSY;  Surgeon: Hildred LaserBudzyn, Brian James, MD;  Location: [facility];  Service: Urology;  Laterality: Left;    There were no vitals filed for this visit.  Subjective Assessment - 04/22/19 1423    Subjective  Pt. reports 7/10 L shoulder with overhead reaching/ daily tasks.  Pt. able to position shoulder to 0/10 pain.    Pertinent History  Pt is a pleasant 53 y.o. female with c/c of L shoulder pain s/p fall down steps onto L shoulder 3 weeks ago.  No fx seen on imaging; no N/T, pain radiates to L upper arm/ elbow.  Pt has  no hx of prior shoulder injury/ pain.  Pt currently does not have follow-up appointment scheduled with MD.  PMH: anxiety, depression, headaches/ migraines, DDD    Limitations  Lifting;Other (comment)    Patient Stated Goals  Work/ ADLs without pain    Currently in Pain?  Yes    Pain Score  7     Pain Location  Shoulder    Pain Orientation  Left    Pain Descriptors / Indicators  Aching    Pain Onset  1 to 4 weeks ago         There.ex.:  Seated shoulder pulley ex.: flexion/ abduction (increase hold)- 20x each B UBE 3 min. F/b.  Standing B shoulder (wand): flexion/ chest press/ shoulder extension/ IR 20x each with mirror feedback for posture/  technique.Reassessment of L shoulder AROM (all planes) 2nd shelf reaching(cones)- 10x Supine serratus punches (2#)/ bicep curls (2#)/ shoulder flexion (2#)- 30x. Supine L shoulder IR/ER isometrics (moderate resistance) 5x each.  Nautilus: 30# lat. Pull downs/ 20# tricep extension/ 20# sh. Adduction 20x   Manual tx.: No charge  STM to L UT/anterior and posterior deltoid/ proximal biceps 4 min.    PT Long Term Goals - 04/03/19 2009      PT LONG TERM GOAL #1   Title  Pt will score at least 57 on FOTO to demonstrate increased functional mobility.    Baseline  FOTO 29 (04/03/2019)    Time  6    Period  Weeks    Status  New    Target Date  05/14/19      PT LONG TERM GOAL #2   Title  Pt will decrease worst pain as reported on NPRS by at least 3 points in order to demonstrate clinically significant reduction in pain.    Baseline  Worst pain 10/10 (04/03/2019)    Time  6    Period  Weeks    Status  New    Target Date  05/14/19      PT LONG TERM GOAL #3   Title  Pt will demonstrate L shoulder AROM of at least 150 for increased mobility.    Baseline  Unable to measure L AROM 2/2 fear of pain, flexion PROM 145 deg (04/03/2019)    Time  6    Period  Weeks    Status  New    Target Date  05/14/19      PT LONG TERM GOAL #4   Title  Pt will report no pain with lifting a gallon of milk to improve function with ADLs.    Baseline  Pain with lifting gallon of milk and grocery bags (04/03/2019)    Time  6    Period  Weeks    Status  New    Target Date  05/14/19         Plan - 04/23/19 1617    Clinical Impression Statement  Pt. demonstrates functional L shoulder AROM (all planes) with increase pain with overhead/ abduction tasks.  PT focus on L shoulder/ UE stabilty ex. program.  Pt. did well with use of Nautilus today and instructed to continue with current HEP.    Examination-Activity Limitations  Bed Mobility;Bathing;Reach Overhead;Carry;Dressing;Sleep;Lift    Stability/Clinical  Decision Making  Stable/Uncomplicated    Clinical Decision Making  Low    Rehab Potential  Good    PT Frequency  2x / week    PT Duration  6 weeks    PT Treatment/Interventions  ADLs/Self Care Home  Management;Cryotherapy;Electrical Stimulation;Moist Heat;Therapeutic exercise;Therapeutic activities;Functional mobility training;Neuromuscular re-education;Patient/family education;Manual techniques;Passive range of motion    PT Next Visit Plan  Continue AAROM, scapular strengething.    PT Home Exercise Plan  [id number]    Consulted and Agree with Plan of Care  Patient       Patient will benefit from skilled therapeutic intervention in order to improve the following deficits and impairments:  Improper body mechanics, Pain, Decreased coordination, Decreased mobility, Decreased activity tolerance, Decreased endurance, Decreased range of motion, Decreased strength, Impaired UE functional use, Impaired perceived functional ability  Visit Diagnosis: Acute pain of left shoulder  Limited range of motion of shoulder  Weakness of shoulder  Muscle weakness (generalized)     Problem List Patient Active Problem List   Diagnosis Date Noted  . Nephrolithiasis 01/26/2017  . Left ureteral stone 01/03/2017  . Endometrial disorder 07/08/2015  . Bilateral occipital neuralgia 03/02/2015  . DDD (degenerative disc disease), lumbar 12/24/2014  . Facet syndrome, lumbar 12/24/2014  . Sacroiliac joint dysfunction 12/24/2014   Cammie Mcgee, PT, DPT # [phone] 04/23/2019, 4:21 PM  [facility] [address] [place], [place], [address] Phone: [phone]   Fax:  [phone]  Name: Mariah Reeves MRN: [id number] Date of Birth: [birth year]

## 2019-04-24 ENCOUNTER — Encounter: Payer: Self-pay | Admitting: Physical Therapy

## 2019-04-24 ENCOUNTER — Other Ambulatory Visit: Payer: Self-pay

## 2019-04-24 ENCOUNTER — Ambulatory Visit: Payer: Medicare Other | Admitting: Physical Therapy

## 2019-04-24 DIAGNOSIS — M25619 Stiffness of unspecified shoulder, not elsewhere classified: Secondary | ICD-10-CM

## 2019-04-24 DIAGNOSIS — R29898 Other symptoms and signs involving the musculoskeletal system: Secondary | ICD-10-CM

## 2019-04-24 DIAGNOSIS — M6281 Muscle weakness (generalized): Secondary | ICD-10-CM

## 2019-04-24 DIAGNOSIS — M25512 Pain in left shoulder: Secondary | ICD-10-CM | POA: Diagnosis not present
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
## 2019-04-29 ENCOUNTER — Other Ambulatory Visit: Payer: Self-pay

## 2019-04-29 ENCOUNTER — Ambulatory Visit: Payer: Medicare Other | Admitting: Physical Therapy

## 2019-04-29 ENCOUNTER — Encounter: Payer: Self-pay | Admitting: Physical Therapy

## 2019-04-29 DIAGNOSIS — M25619 Stiffness of unspecified shoulder, not elsewhere classified: Secondary | ICD-10-CM

## 2019-04-29 DIAGNOSIS — M25512 Pain in left shoulder: Secondary | ICD-10-CM

## 2019-04-29 DIAGNOSIS — M6281 Muscle weakness (generalized): Secondary | ICD-10-CM

## 2019-04-29 DIAGNOSIS — R29898 Other symptoms and signs involving the musculoskeletal system: Secondary | ICD-10-CM

## 2019-04-29 NOTE — Therapy (Signed)
[facility] [address] [place], [place], [address] Phone: [phone]   Fax:  [phone]  Physical Therapy Treatment  Patient Details  Name: Mariah Reeves MRN: [id number] Date of Birth: December 07, 1965 Referring Provider (PT): Amie Portland, MD   Encounter Date: 04/29/2019  PT End of Session - 05/02/19 1442    Visit Number  8    Number of Visits  12    Date for PT Re-Evaluation  05/14/19    Authorization - Visit Number  8    Authorization - Number of Visits  10    PT Start Time  1422    PT Stop Time  1517    PT Time Calculation (min)  55 min    Activity Tolerance  Patient tolerated treatment well;Patient limited by pain    Behavior During Therapy  [facility] for tasks assessed/performed       Past Medical History:  Diagnosis Date  . Allergy   . Anomaly, uterus    tumor of uterus  . Anxiety   . Bilateral occipital neuralgia   . Bipolar 1 disorder ([facility])   . Chronic back pain   . Degenerative disc disease, lumbar   . Depression   . Facet syndrome, lumbar   . Headache    Migraines  . History of kidney stones    Left  . Hx of vertigo   . Nephrolithiasis 01/26/2017  . Patella fracture    Left  . Sleep apnea    C-PAP    Past Surgical History:  Procedure Laterality Date  . ABDOMINOPLASTY  2006  . CARPAL TUNNEL RELEASE Left   . CHOLECYSTECTOMY    . CYSTOSCOPY W/ URETERAL STENT PLACEMENT  01/03/2017   Procedure: CYSTOSCOPY WITH RETROGRADE PYELOGRAM/URETERAL STENT PLACEMENT;  Surgeon: Hildred Laser, MD;  Location: [facility];  Service: Urology;;  . Bluford Kaufmann W/ URETERAL STENT PLACEMENT Left 01/17/2017   Procedure: CYSTOSCOPY WITH STENT REPLACEMENT;  Surgeon: Hildred Laser, MD;  Location: [facility];  Service: Urology;  Laterality: Left;  . DILATATION & CURETTAGE/HYSTEROSCOPY WITH MYOSURE N/A 07/08/2015   Procedure: DILATATION & CURETTAGE/HYSTEROSCOPY WITH MYOSURE;  Surgeon: Nadara Mustard, MD;  Location: [facility];  Service:  Gynecology;  Laterality: N/A;  . DILATION AND CURETTAGE OF UTERUS     1/17  . EXTRACORPOREAL SHOCK WAVE LITHOTRIPSY Left 12/07/2016   Procedure: EXTRACORPOREAL SHOCK WAVE LITHOTRIPSY (ESWL);  Surgeon: Hildred Laser, MD;  Location: [facility];  Service: Urology;  Laterality: Left;  Marland Kitchen GASTRIC BYPASS    . KNEE SURGERY    . LITHOTRIPSY    . MIDDLE EAR SURGERY    . NASAL SEPTUM SURGERY    . URETEROSCOPY WITH HOLMIUM LASER LITHOTRIPSY     perforated ureter. had nephrostomy tube for brief time  . URETEROSCOPY WITH HOLMIUM LASER LITHOTRIPSY Left 01/17/2017   Procedure: URETEROSCOPY WITH HOLMIUM LASER LITHOTRIPSY;  Surgeon: Hildred Laser, MD;  Location: [facility];  Service: Urology;  Laterality: Left;    There were no vitals filed for this visit.  Subjective Assessment - 05/02/19 1437    Subjective  Pt. reports no new issues.  Pt. remains pain limited with overhead reaching with L UE.  Pt. has been busy with getting rental car/ dealing with car insurance.    Pertinent History  Pt is a pleasant 53 y.o. female with c/c of L shoulder pain s/p fall down steps onto L shoulder 3 weeks ago.  No fx seen on imaging; no N/T,  pain radiates to L upper arm/ elbow.  Pt has no hx of prior shoulder injury/ pain.  Pt currently does not have follow-up appointment scheduled with MD.  PMH: anxiety, depression, headaches/ migraines, DDD    Limitations  Lifting;Other (comment)    Patient Stated Goals  Work/ ADLs without pain    Currently in Pain?  Yes    Pain Score  6     Pain Location  Shoulder    Pain Orientation  Left    Pain Descriptors / Indicators  Aching    Pain Onset  More than a month ago        FOTO: initial 29/ today 41/ goal 57  There.ex.:  B UBE 2 min. F/b.(warm-up).   Supine chest press with weighted wand 30x/ serratus punches with wand 30x.   Nautilus: 30# lat.  Pull downs/ 20# tricep extension/ 20# sh. Adduction/ 30# scap. Retraction/ 10# bicep curls  15x2. Supine L shoulder rhythmic stabs 3x30 sec. Each (mod. Resistance) Standing L shoulder AROM (all planes)- 10x each with mirror feedback.    Manual tx.:  STM to L UT/anterior and posterior deltoid/ proximal biceps2min. Supine grade II AP/PA/inf. Mobs. 3x30 sec. Supine L shoulder LAD (gentle oscillations) 5x.      PT Long Term Goals - 04/03/19 2009      PT LONG TERM GOAL #1   Title  Pt will score at least 57 on FOTO to demonstrate increased functional mobility.    Baseline  FOTO 29 (04/03/2019)    Time  6    Period  Weeks    Status  New    Target Date  05/14/19      PT LONG TERM GOAL #2   Title  Pt will decrease worst pain as reported on NPRS by at least 3 points in order to demonstrate clinically significant reduction in pain.    Baseline  Worst pain 10/10 (04/03/2019)    Time  6    Period  Weeks    Status  New    Target Date  05/14/19      PT LONG TERM GOAL #3   Title  Pt will demonstrate L shoulder AROM of at least 150 for increased mobility.    Baseline  Unable to measure L AROM 2/2 fear of pain, flexion PROM 145 deg (04/03/2019)    Time  6    Period  Weeks    Status  New    Target Date  05/14/19      PT LONG TERM GOAL #4   Title  Pt will report no pain with lifting a gallon of milk to improve function with ADLs.    Baseline  Pain with lifting gallon of milk and grocery bags (04/03/2019)    Time  6    Period  Weeks    Status  New    Target Date  05/14/19         Plan - 05/02/19 1443    Clinical Impression Statement  Pt. continues to demonstrate full L shoulder ROM but remains pain focused/ limited.  Pt. continues to have good capsular mobility (anterior/ posterior) during AROM in supine and seated posture.  PT focus remains on sh./ scapular stabilization ex. program.    Examination-Activity Limitations  Bed Mobility;Bathing;Reach  Overhead;Carry;Dressing;Sleep;Lift    Stability/Clinical Decision Making  Stable/Uncomplicated    Clinical Decision Making  Low    Rehab Potential  Good    PT Frequency  2x / week    PT  Duration  6 weeks    PT Treatment/Interventions  ADLs/Self Care Home Management;Cryotherapy;Electrical Stimulation;Moist Heat;Therapeutic exercise;Therapeutic activities;Functional mobility training;Neuromuscular re-education;Patient/family education;Manual techniques;Passive range of motion    PT Next Visit Plan  Progress shoulder/ scapular stabilization.    PT Home Exercise Plan  [id number]    Consulted and Agree with Plan of Care  Patient       Patient will benefit from skilled therapeutic intervention in order to improve the following deficits and impairments:  Improper body mechanics, Pain, Decreased coordination, Decreased mobility, Decreased activity tolerance, Decreased endurance, Decreased range of motion, Decreased strength, Impaired UE functional use, Impaired perceived functional ability  Visit Diagnosis: Acute pain of left shoulder  Limited range of motion of shoulder  Weakness of shoulder  Muscle weakness (generalized)     Problem List Patient Active Problem List   Diagnosis Date Noted  . Nephrolithiasis 01/26/2017  . Left ureteral stone 01/03/2017  . Endometrial disorder 07/08/2015  . Bilateral occipital neuralgia 03/02/2015  . DDD (degenerative disc disease), lumbar 12/24/2014  . Facet syndrome, lumbar 12/24/2014  . Sacroiliac joint dysfunction 12/24/2014   Cammie McgeeMichael C Motty Borin, PT, DPT # [id number] 05/02/2019, 2:46 PM  [place] [facility] [address] [place], [place], [address] Phone: [phone]   Fax:  [phone]  Name: Mariah Reeves MRN: [id number] Date of Birth: 02/05/1966

## 2019-05-01 ENCOUNTER — Other Ambulatory Visit: Payer: Self-pay

## 2019-05-01 ENCOUNTER — Ambulatory Visit: Payer: Medicare Other | Admitting: Physical Therapy

## 2019-05-01 DIAGNOSIS — M25512 Pain in left shoulder: Secondary | ICD-10-CM | POA: Diagnosis not present

## 2019-05-01 DIAGNOSIS — R29898 Other symptoms and signs involving the musculoskeletal system: Secondary | ICD-10-CM

## 2019-05-01 DIAGNOSIS — M6281 Muscle weakness (generalized): Secondary | ICD-10-CM

## 2019-05-01 DIAGNOSIS — M25619 Stiffness of unspecified shoulder, not elsewhere classified: Secondary | ICD-10-CM

## 2019-05-02 ENCOUNTER — Encounter: Payer: Self-pay | Admitting: Physical Therapy

## 2019-05-02 NOTE — Therapy (Signed)
[place] [facility] [address] [place], [place], [address] Phone: [phone]   Fax:  [phone]  Physical Therapy Treatment  Patient Details  Name: Mariah AlbertsJoy A Borrero MRN: [id number] Date of Birth: 01-01-1966 Referring Provider (PT): Amie Portlandavid Ormond, MD   Encounter Date: 05/01/2019  PT End of Session - 05/02/19 1923    Visit Number  9    Number of Visits  12    Date for PT Re-Evaluation  05/14/19    Authorization - Visit Number  9    Authorization - Number of Visits  10    PT Start Time  1432    PT Stop Time  1526    PT Time Calculation (min)  54 min    Activity Tolerance  Patient tolerated treatment well;Patient limited by pain    Behavior During Therapy  [facility] for tasks assessed/performed       Past Medical History:  Diagnosis Date  . Allergy   . Anomaly, uterus    tumor of uterus  . Anxiety   . Bilateral occipital neuralgia   . Bipolar 1 disorder ([facility])   . Chronic back pain   . Degenerative disc disease, lumbar   . Depression   . Facet syndrome, lumbar   . Headache    Migraines  . History of kidney stones    Left  . Hx of vertigo   . Nephrolithiasis 01/26/2017  . Patella fracture    Left  . Sleep apnea    C-PAP    Past Surgical History:  Procedure Laterality Date  . ABDOMINOPLASTY  2006  . CARPAL TUNNEL RELEASE Left   . CHOLECYSTECTOMY    . CYSTOSCOPY W/ URETERAL STENT PLACEMENT  01/03/2017   Procedure: CYSTOSCOPY WITH RETROGRADE PYELOGRAM/URETERAL STENT PLACEMENT;  Surgeon: Hildred LaserBudzyn, Brian James, MD;  Location: [facility];  Service: Urology;;  . Bluford KaufmannYSTOSCOPY W/ URETERAL STENT PLACEMENT Left 01/17/2017   Procedure: CYSTOSCOPY WITH STENT REPLACEMENT;  Surgeon: Hildred LaserBudzyn, Brian James, MD;  Location: [facility];  Service: Urology;  Laterality: Left;  . DILATATION & CURETTAGE/HYSTEROSCOPY WITH MYOSURE N/A 07/08/2015   Procedure: DILATATION & CURETTAGE/HYSTEROSCOPY WITH MYOSURE;  Surgeon: Nadara Mustardobert P Harris, MD;  Location: [facility];  Service:  Gynecology;  Laterality: N/A;  . DILATION AND CURETTAGE OF UTERUS     1/17  . EXTRACORPOREAL SHOCK WAVE LITHOTRIPSY Left 12/07/2016   Procedure: EXTRACORPOREAL SHOCK WAVE LITHOTRIPSY (ESWL);  Surgeon: Hildred LaserBudzyn, Brian James, MD;  Location: [facility];  Service: Urology;  Laterality: Left;  Marland Kitchen. GASTRIC BYPASS    . KNEE SURGERY    . LITHOTRIPSY    . MIDDLE EAR SURGERY    . NASAL SEPTUM SURGERY    . URETEROSCOPY WITH HOLMIUM LASER LITHOTRIPSY     perforated ureter. had nephrostomy tube for brief time  . URETEROSCOPY WITH HOLMIUM LASER LITHOTRIPSY Left 01/17/2017   Procedure: URETEROSCOPY WITH HOLMIUM LASER LITHOTRIPSY;  Surgeon: Hildred LaserBudzyn, Brian James, MD;  Location: [facility];  Service: Urology;  Laterality: Left;    There were no vitals filed for this visit.  Subjective Assessment - 05/02/19 1918    Subjective  Pt. reports soreness/ discomfort in L shoulder/ upper arm prior to tx. session.  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  Pt[place] see family for the Thanksgiving holiday.    Pertinent History  Pt is a pleasant 53 y.o. female with c/c of L shoulder pain s/p fall down steps  onto L shoulder 3 weeks ago.  No fx seen on imaging; no N/T, pain radiates to L upper arm/ elbow.  Pt has no hx of prior shoulder injury/ pain.  Pt currently does not have follow-up appointment scheduled with MD.  PMH: anxiety, depression, headaches/ migraines, DDD    Limitations  Lifting;Other (comment)    Patient Stated Goals  Work/ ADLs without pain    Currently in Pain?  Yes    Pain Score  6     Pain Location  Shoulder    Pain Orientation  Left    Pain Descriptors / Indicators  Aching    Pain Onset  More than a month ago          There.ex.:  B UBE 3 min. F/b.(warm-up).   Supine chest press with weighted wand 30x/  serratus punches with wand 30x.   Nautilus: 30# lat. Pull downs/ 20# tricep extension/ 20# sh. Adduction/ 30# scap. Retraction/ 10# bicep curls 15x2. Supine L shoulder rhythmic stabs 3x30 sec. Each (mod. Resistance) Standing ball up/down wall 10x.   Standing L shoulder AROM (all planes)- 10x each with mirror feedback.  Cuing for posture correction.    Manual tx.: (<5 min.)- no charge  STM to L UT/anterior and posterior deltoid/ proximal biceps10min.    PT Long Term Goals - 04/03/19 2009      PT LONG TERM GOAL #1   Title  Pt will score at least 57 on FOTO to demonstrate increased functional mobility.    Baseline  FOTO 29 (04/03/2019)    Time  6    Period  Weeks    Status  New    Target Date  05/14/19      PT LONG TERM GOAL #2   Title  Pt will decrease worst pain as reported on NPRS by at least 3 points in order to demonstrate clinically significant reduction in pain.    Baseline  Worst pain 10/10 (04/03/2019)    Time  6    Period  Weeks    Status  New    Target Date  05/14/19      PT LONG TERM GOAL #3   Title  Pt will demonstrate L shoulder AROM of at least 150 for increased mobility.    Baseline  Unable to measure L AROM 2/2 fear of pain, flexion PROM 145 deg (04/03/2019)    Time  6    Period  Weeks    Status  New    Target Date  05/14/19      PT LONG TERM GOAL #4   Title  Pt will report no pain with lifting a gallon of milk to improve function with ADLs.    Baseline  Pain with lifting gallon of milk and grocery bags (04/03/2019)    Time  6    Period  Weeks    Status  New    Target Date  05/14/19            Plan - 05/02/19 1924    Clinical Impression Statement  Progressing shoulder/ scapular stability ex. program in pain tolerable range.  Pts. ROM remains full (all planes) with good capsular mobility.  Moderate cuing to correct upright posture/ head position with seated/ standing ther.ex.  Pt. will continue with current HEP and PT will reassess goals next  tx. session.    Examination-Activity Limitations  Bed Mobility;Bathing;Reach Overhead;Carry;Dressing;Sleep;Lift    Stability/Clinical Decision Making  Stable/Uncomplicated    Clinical Decision Making  Low  Rehab Potential  Good    PT Frequency  2x / week    PT Duration  6 weeks    PT Treatment/Interventions  ADLs/Self Care Home Management;Cryotherapy;Electrical Stimulation;Moist Heat;Therapeutic exercise;Therapeutic activities;Functional mobility training;Neuromuscular re-education;Patient/family education;Manual techniques;Passive range of motion    PT Next Visit Plan  Progress shoulder/ scapular stabilization.  Check goals/ schedule next tx. session.    PT Home Exercise Plan  [id number]    Consulted and Agree with Plan of Care  Patient       Patient will benefit from skilled therapeutic intervention in order to improve the following deficits and impairments:  Improper body mechanics, Pain, Decreased coordination, Decreased mobility, Decreased activity tolerance, Decreased endurance, Decreased range of motion, Decreased strength, Impaired UE functional use, Impaired perceived functional ability  Visit Diagnosis: Acute pain of left shoulder  Limited range of motion of shoulder  Weakness of shoulder  Muscle weakness (generalized)     Problem List Patient Active Problem List   Diagnosis Date Noted  . Nephrolithiasis 01/26/2017  . Left ureteral stone 01/03/2017  . Endometrial disorder 07/08/2015  . Bilateral occipital neuralgia 03/02/2015  . DDD (degenerative disc disease), lumbar 12/24/2014  . Facet syndrome, lumbar 12/24/2014  . Sacroiliac joint dysfunction 12/24/2014   Pura Spice, PT, DPT # [phone] 05/02/2019, 7:39 PM  [place] [facility] [address] [place], [place], [address] Phone: [phone]   Fax:  [phone]  Name: RUSSIE GULLEDGE MRN: [id number] Date of Birth: [birth year]

## 2019-05-06 ENCOUNTER — Encounter: Payer: Self-pay | Admitting: Physical Therapy

## 2019-05-06 ENCOUNTER — Other Ambulatory Visit: Payer: Self-pay

## 2019-05-06 ENCOUNTER — Ambulatory Visit: Payer: Medicare Other | Admitting: Physical Therapy

## 2019-05-06 DIAGNOSIS — M25619 Stiffness of unspecified shoulder, not elsewhere classified: Secondary | ICD-10-CM

## 2019-05-06 DIAGNOSIS — R29898 Other symptoms and signs involving the musculoskeletal system: Secondary | ICD-10-CM

## 2019-05-06 DIAGNOSIS — M6281 Muscle weakness (generalized): Secondary | ICD-10-CM

## 2019-05-06 DIAGNOSIS — M25512 Pain in left shoulder: Secondary | ICD-10-CM | POA: Diagnosis not present

## 2019-05-06 NOTE — Therapy (Signed)
[facility] [address] [place], [place], [address] Phone: [phone]   Fax:  [phone]  Physical Therapy Treatment  Patient Details  Name: Mariah Reeves MRN: [id number] Date of Birth: November 21, 1965 Referring Provider (PT): Lajean Manes, MD   Encounter Date: 05/06/2019  PT End of Session - 05/06/19 1216    Visit Number  10    Number of Visits  12    Date for PT Re-Evaluation  05/14/19    Authorization - Visit Number  10    Authorization - Number of Visits  10    PT Start Time  3888    PT Stop Time  1116    PT Time Calculation (min)  40 min    Activity Tolerance  Patient tolerated treatment well;Patient limited by pain    Behavior During Therapy  [facility] for tasks assessed/performed       Past Medical History:  Diagnosis Date  . Allergy   . Anomaly, uterus    tumor of uterus  . Anxiety   . Bilateral occipital neuralgia   . Bipolar 1 disorder ([place])   . Chronic back pain   . Degenerative disc disease, lumbar   . Depression   . Facet syndrome, lumbar   . Headache    Migraines  . History of kidney stones    Left  . Hx of vertigo   . Nephrolithiasis 01/26/2017  . Patella fracture    Left  . Sleep apnea    C-PAP    Past Surgical History:  Procedure Laterality Date  . ABDOMINOPLASTY  2006  . CARPAL TUNNEL RELEASE Left   . CHOLECYSTECTOMY    . CYSTOSCOPY W/ URETERAL STENT PLACEMENT  01/03/2017   Procedure: CYSTOSCOPY WITH RETROGRADE PYELOGRAM/URETERAL STENT PLACEMENT;  Surgeon: Nickie Retort, MD;  Location: [facility] ORS;  Service: Urology;;  . Consuela Mimes W/ URETERAL STENT PLACEMENT Left 01/17/2017   Procedure: CYSTOSCOPY WITH STENT REPLACEMENT;  Surgeon: Nickie Retort, MD;  Location: [facility] OR[facility];  Service: Urology;  Laterality: Left;  . DILATATION & CURETTAGE/HYSTEROSCOPY WITH MYOSURE N/A 07/08/2015   Procedure: DILATATION & CURETTAGE/HYSTEROSCOPY WITH MYOSURE;  Surgeon: Gae Dry, MD;  Location: [facility] OR[facility];   Service: Gynecology;  Laterality: N/A;  . DILATION AND CURETTAGE OF UTERUS     1/17  . EXTRACORPOREAL SHOCK WAVE LITHOTRIPSY Left 12/07/2016   Procedure: EXTRACORPOREAL SHOCK WAVE LITHOTRIPSY (ESWL);  Surgeon: Nickie Retort, MD;  Location: [facility];  Service: Urology;  Laterality: Left;  Marland Kitchen GASTRIC BYPASS    . KNEE SURGERY    . LITHOTRIPSY    . MIDDLE EAR SURGERY    . NASAL SEPTUM SURGERY    . URETEROSCOPY WITH HOLMIUM LASER LITHOTRIPSY     perforated ureter. had nephrostomy tube for brief time  . URETEROSCOPY WITH HOLMIUM LASER LITHOTRIPSY Left 01/17/2017   Procedure: URETEROSCOPY WITH HOLMIUM LASER LITHOTRIPSY;  Surgeon: Nickie Retort, MD;  Location: [facility] OR[facility];  Service: Urology;  Laterality: Left;    There were no vitals filed for this visit.  Subjective Assessment - 05/06/19 1039    Subjective  Pt. reports pain score is low (3-4/10) L shoulder at this time.  Pt. flying to be with family in [place] tomorrow for Thanksgiving.  Pt. will contact PT after MD f/u.    Pertinent History  Pt is a pleasant 53 y.o. female with c/c of L shoulder pain s/p fall down steps onto L shoulder 3 weeks ago.  No fx seen on  imaging; no N/T, pain radiates to L upper arm/ elbow.  Pt has no hx of prior shoulder injury/ pain.  Pt currently does not have follow-up appointment scheduled with MD.  PMH: anxiety, depression, headaches/ migraines, DDD    Limitations  Lifting;Other (comment)    Patient Stated Goals  Work/ ADLs without pain    Currently in Pain?  Yes    Pain Score  3     Pain Location  Shoulder    Pain Orientation  Left    Pain Descriptors / Indicators  Aching    Pain Onset  More than a month ago            There.ex.:  B [id number]. F/b.(warm-up). Supine chest press with weighted wand 30x/ serratus punches with wand 30x. Nautilus: 30# lat. Pull downs/  20# tricep extension/ 20# sh. Adduction/ 30# scap. Retraction/ 10# bicep curls 15x2. Standing L shoulder AROM (all planes)- 10x each with mirror feedback. Cuing for posture correction.  Reviewed HEP in depth  Manual tx.: (<5 min.)- no charge  STM to L UT/anterior and posterior deltoid/ proximal biceps568m.    PT Long Term Goals - 05/06/19 1232      PT LONG TERM GOAL #1   Title  Pt will score at least 57 on FOTO to demonstrate increased functional mobility.    Baseline  FOTO 29 (04/03/2019)    Time  6    Period  Weeks    Status  Unable to assess    Target Date  05/14/19      PT LONG TERM GOAL #2   Title  Pt will decrease worst pain as reported on NPRS by at least 3 points in order to demonstrate clinically significant reduction in pain.    Baseline  Worst pain 10/10 (04/03/2019).  On 11/24: <6/10 pain at worst.  3-4/10 L shoulder pain currently at rest.    Time  6    Period  Weeks    Status  Partially Met    Target Date  05/14/19      PT LONG TERM GOAL #3   Title  Pt will demonstrate L shoulder AROM of at least 150 for increased mobility.    Baseline  Unable to measure L AROM 2/2 fear of pain, flexion PROM 145 deg (04/03/2019)    Time  6    Period  Weeks    Status  Achieved    Target Date  05/06/19      PT LONG TERM GOAL #4   Title  Pt will report no pain with lifting a gallon of milk to improve function with ADLs.    Baseline  Pain with lifting gallon of milk and grocery bags (04/03/2019)    Time  6    Period  Weeks    Status  Partially Met    Target Date  05/14/19            Plan - 05/06/19 1217    Clinical Impression Statement  Pt. has progressed well with skilled PT services towards L shoulder ROM/ strengthening goals.  Pt. presents with full L shoulder AROM all planes with discomfort reported at end-range flexion/ abduction.  Good capsular mobility (all planes).  Probable discharge from PT with focus on HEP.  Pt. has f/u with MD to discuss status.  See  updated goals.    Examination-Activity Limitations  Bed Mobility;Bathing;Reach Overhead;Carry;Dressing;Sleep;Lift    Stability/Clinical Decision Making  Stable/Uncomplicated    Clinical Decision Making  Low  Rehab Potential  Good    PT Frequency  2x / week    PT Duration  6 weeks    PT Treatment/Interventions  ADLs/Self Care Home Management;Cryotherapy;Electrical Stimulation;Moist Heat;Therapeutic exercise;Therapeutic activities;Functional mobility training;Neuromuscular re-education;Patient/family education;Manual techniques;Passive range of motion    PT Next Visit Plan  Probable discharge with focus on HEP.    PT Home Exercise Plan  [id number]    Consulted and Agree with Plan of Care  Patient       Patient will benefit from skilled therapeutic intervention in order to improve the following deficits and impairments:  Improper body mechanics, Pain, Decreased coordination, Decreased mobility, Decreased activity tolerance, Decreased endurance, Decreased range of motion, Decreased strength, Impaired UE functional use, Impaired perceived functional ability  Visit Diagnosis: Acute pain of left shoulder  Limited range of motion of shoulder  Weakness of shoulder  Muscle weakness (generalized)     Problem List Patient Active Problem List   Diagnosis Date Noted  . Nephrolithiasis 01/26/2017  . Left ureteral stone 01/03/2017  . Endometrial disorder 07/08/2015  . Bilateral occipital neuralgia 03/02/2015  . DDD (degenerative disc disease), lumbar 12/24/2014  . Facet syndrome, lumbar 12/24/2014  . Sacroiliac joint dysfunction 12/24/2014   Pura Spice, PT, DPT # [id number] 05/06/2019, 12:35 PM  [facility] [address] [place], [place], [address] Phone: [phone]   Fax:  [phone]  Name: Mariah Reeves MRN: [id number] Date of Birth: Oct 23, 1965

## 2019-05-28 ENCOUNTER — Other Ambulatory Visit: Payer: Self-pay | Admitting: Orthopedic Surgery

## 2019-05-28 DIAGNOSIS — M25512 Pain in left shoulder: Secondary | ICD-10-CM

## 2019-05-28 DIAGNOSIS — S46012A Strain of muscle(s) and tendon(s) of the rotator cuff of left shoulder, initial encounter: Secondary | ICD-10-CM

## 2019-05-30 ENCOUNTER — Telehealth (HOSPITAL_COMMUNITY): Payer: Self-pay

## 2019-06-02 ENCOUNTER — Other Ambulatory Visit: Payer: Self-pay | Admitting: Orthopedic Surgery

## 2019-06-02 ENCOUNTER — Other Ambulatory Visit: Payer: Self-pay | Admitting: Physician Assistant

## 2019-06-02 DIAGNOSIS — R109 Unspecified abdominal pain: Secondary | ICD-10-CM

## 2019-06-02 DIAGNOSIS — S46012A Strain of muscle(s) and tendon(s) of the rotator cuff of left shoulder, initial encounter: Secondary | ICD-10-CM

## 2019-06-09 ENCOUNTER — Other Ambulatory Visit: Payer: Self-pay | Admitting: *Deleted

## 2019-06-09 DIAGNOSIS — R109 Unspecified abdominal pain: Secondary | ICD-10-CM

## 2019-06-09 DIAGNOSIS — Z87442 Personal history of urinary calculi: Secondary | ICD-10-CM

## 2019-06-10 ENCOUNTER — Ambulatory Visit: Payer: BC Managed Care – PPO | Admitting: Physician Assistant

## 2019-06-11 ENCOUNTER — Ambulatory Visit (INDEPENDENT_AMBULATORY_CARE_PROVIDER_SITE_OTHER): Payer: Medicare Other | Admitting: Physician Assistant

## 2019-06-11 ENCOUNTER — Other Ambulatory Visit: Payer: Self-pay

## 2019-06-11 ENCOUNTER — Ambulatory Visit
Admission: RE | Admit: 2019-06-11 | Discharge: 2019-06-11 | Disposition: A | Discharge status: Home / Self Care | Payer: Medicare Other | Attending: Physician Assistant | Admitting: Physician Assistant

## 2019-06-11 ENCOUNTER — Encounter: Payer: Self-pay | Admitting: Physician Assistant

## 2019-06-11 ENCOUNTER — Ambulatory Visit: Admission: RE | Admit: 2019-06-11 | Payer: BC Managed Care – PPO | Source: Home / Self Care

## 2019-06-11 ENCOUNTER — Ambulatory Visit
Admission: RE | Admit: 2019-06-11 | Discharge: 2019-06-11 | Disposition: A | Discharge status: Home / Self Care | Payer: Medicare Other | Source: Ambulatory Visit | Attending: Physician Assistant | Admitting: Physician Assistant

## 2019-06-11 VITALS — BP 146/109 | HR 62 | Ht 60.0 in | Wt 180.0 lb

## 2019-06-11 DIAGNOSIS — Z87442 Personal history of urinary calculi: Secondary | ICD-10-CM | POA: Insufficient documentation

## 2019-06-11 DIAGNOSIS — R109 Unspecified abdominal pain: Secondary | ICD-10-CM

## 2019-06-11 LAB — URINALYSIS, COMPLETE
Bilirubin, UA: NEGATIVE
Glucose, UA: NEGATIVE
Ketones, UA: NEGATIVE
Leukocytes,UA: NEGATIVE
Nitrite, UA: NEGATIVE
Protein,UA: NEGATIVE
RBC, UA: NEGATIVE
Specific Gravity, UA: >1.03 — ABNORMAL HIGH (ref 1.005–1.030)
Urobilinogen, Ur: 1 mg/dL (ref 0.2–1.0)
pH, UA: 5 (ref 5.0–7.5)

## 2019-06-11 LAB — MICROSCOPIC EXAMINATION
RBC, Urine: NONE SEEN /hpf (ref 0–2)
WBC, UA: NONE SEEN /hpf (ref 0–5)

## 2019-06-11 NOTE — Progress Notes (Signed)
06/11/2019 2:03 PM   Mariah Reeves 05-04-1966 [id number]  CC: Right flank pain  HPI: Mariah Reeves is a 53 y.o. female who presents today for evaluation of possible acute stone episode. She is an established [facility] patient who last saw Dr. Tresa Moore on 01/26/2017 for cystoscopy stent removal following ESWL with subsequent ureteroscopy for treatment of a 6 mm distal left ureteral stone as well as a 3 mm left renal stone.  She was believed to be stone free following these procedures.  Today, she reports an approximate 3-week history of constant right flank pain associated with nausea and urinary frequency.  She denies dysuria, urgency, gross hematuria, fevers, chills, and vomiting.  Pain is nonradiating and without exacerbating factors.  She does have chronic back pain at baseline and admits it is difficult for her to differentiate between stone and back pain at times.  She reports a history of occasional UTIs, not recurrent.  She is s/p Roux-en-Y gastric bypass in 2005.  She has taken Zofran and hydrocodone at home for treatment of her symptoms with moderate palliation.  KUB today without radiopaque calculi.  In-office UA and microscopy today pan-negative.    PMH: Past Medical History:  Diagnosis Date  . Allergy   . Anomaly, uterus    tumor of uterus  . Anxiety   . Bilateral occipital neuralgia   . Bipolar 1 disorder ([place])   . Chronic back pain   . Degenerative disc disease, lumbar   . Depression   . Facet syndrome, lumbar   . Headache    Migraines  . History of kidney stones    Left  . Hx of vertigo   . Nephrolithiasis 01/26/2017  . Patella fracture    Left  . Sleep apnea    C-PAP    Surgical History: Past Surgical History:  Procedure Laterality Date  . ABDOMINOPLASTY  2006  . CARPAL TUNNEL RELEASE Left   . CHOLECYSTECTOMY    . CYSTOSCOPY W/ URETERAL STENT PLACEMENT  01/03/2017   Procedure: CYSTOSCOPY WITH RETROGRADE PYELOGRAM/URETERAL STENT PLACEMENT;  Surgeon: Nickie Retort, MD;  Location: [facility];  Service: Urology;;  . Consuela Mimes W/ URETERAL STENT PLACEMENT Left 01/17/2017   Procedure: CYSTOSCOPY WITH STENT REPLACEMENT;  Surgeon: Nickie Retort, MD;  Location: [facility];  Service: Urology;  Laterality: Left;  . DILATATION & CURETTAGE/HYSTEROSCOPY WITH MYOSURE N/A 07/08/2015   Procedure: DILATATION & CURETTAGE/HYSTEROSCOPY WITH MYOSURE;  Surgeon: Gae Dry, MD;  Location: [facility];  Service: Gynecology;  Laterality: N/A;  . DILATION AND CURETTAGE OF UTERUS     1/17  . EXTRACORPOREAL SHOCK WAVE LITHOTRIPSY Left 12/07/2016   Procedure: EXTRACORPOREAL SHOCK WAVE LITHOTRIPSY (ESWL);  Surgeon: Nickie Retort, MD;  Location: [facility];  Service: Urology;  Laterality: Left;  Marland Kitchen GASTRIC BYPASS    . KNEE SURGERY    . LITHOTRIPSY    . MIDDLE EAR SURGERY    . NASAL SEPTUM SURGERY    . URETEROSCOPY WITH HOLMIUM LASER LITHOTRIPSY     perforated ureter. had nephrostomy tube for brief time  . URETEROSCOPY WITH HOLMIUM LASER LITHOTRIPSY Left 01/17/2017   Procedure: URETEROSCOPY WITH HOLMIUM LASER LITHOTRIPSY;  Surgeon: Nickie Retort, MD;  Location: [facility];  Service: Urology;  Laterality: Left;    Home Medications:  Allergies as of 06/11/2019      Reactions   Contrast Media [iodinated Diagnostic Agents] Hives   Carbetapentane Hives   Tape Hives   Adhesive Tape  Medication List       Accurate as of June 11, 2019  2:03 PM. If you have any questions, ask your nurse or doctor.        acetaminophen 500 MG tablet Commonly known as: TYLENOL Take 1,000 mg by mouth 2 (two) times daily as needed for headache.   albuterol 108 (90 Base) MCG/ACT inhaler Commonly known as: VENTOLIN HFA ProAir HFA 90 mcg/actuation aerosol inhaler  TAKE 2 PUFFS BY MOUTH EVERY 4 TO 6 HOURS AS NEEDED FOR WHEEZE   escitalopram 20 MG tablet Commonly known as: LEXAPRO Take 20 mg by mouth at bedtime. Reported on 07/08/2015   ferrous sulfate 325 (65 FE) MG  tablet Take 650 mg by mouth daily.   gabapentin 600 MG tablet Commonly known as: NEURONTIN Take 600 mg by mouth daily.   HYDROcodone-acetaminophen 10-325 MG tablet Commonly known as: NORCO Take 1 tablet by mouth every 6 (six) hours as needed.   lamoTRIgine 200 MG tablet Commonly known as: LAMICTAL Take 400 mg by mouth at bedtime. Reported on 11/18/2015   meclizine 25 MG tablet Commonly known as: Medi-Meclizine Take 1 tablet (25 mg total) by mouth 3 (three) times daily as needed for dizziness.   MULTIVITAMIN PO Take 1 tablet by mouth daily.   Narcan 4 MG/0.1ML Liqd nasal spray kit Generic drug: naloxone Narcan 4 mg/actuation nasal spray   ondansetron 4 MG disintegrating tablet Commonly known as: ZOFRAN-ODT Take 4 mg by mouth daily as needed for nausea or vomiting. Reported on 11/18/2015   oxyCODONE-acetaminophen 5-325 MG tablet Commonly known as: Roxicet Take 1 tablet by mouth every 4 (four) hours as needed for severe pain.   traZODone 100 MG tablet Commonly known as: DESYREL Take 100 mg by mouth at bedtime as needed for sleep.       Allergies:  Allergies  Allergen Reactions  . Contrast Media [Iodinated Diagnostic Agents] Hives  . Carbetapentane Hives  . Tape Hives    Adhesive Tape    Family History: Family History  Problem Relation Age of Onset  . Hypertension Mother   . Diabetes Mother   . Heart disease Father   . Alcohol abuse Father   . Breast cancer Maternal Grandmother   . Kidney cancer Neg Hx   . Bladder Cancer Neg Hx     Social History:   reports that she has never smoked. She has never used smokeless tobacco. She reports that she does not drink alcohol or use drugs.  ROS: UROLOGY Frequent Urination?: Yes Hard to postpone urination?: No Burning/pain with urination?: No Get up at night to urinate?: Yes Leakage of urine?: No Urine stream starts and stops?: No Trouble starting stream?: No Do you have to strain to urinate?: No Blood in urine?:  No Urinary tract infection?: No Sexually transmitted disease?: No Injury to kidneys or bladder?: No Painful intercourse?: No Weak stream?: No Currently pregnant?: No Vaginal bleeding?: No Last menstrual period?: n  Gastrointestinal Nausea?: Yes Vomiting?: No Indigestion/heartburn?: No Diarrhea?: No Constipation?: No  Constitutional Fever: No Night sweats?: No Weight loss?: No Fatigue?: No  Skin Skin rash/lesions?: No Itching?: No  Eyes Blurred vision?: No Double vision?: No  Ears/Nose/Throat Sore throat?: No Sinus problems?: No  Hematologic/Lymphatic Swollen glands?: No Easy bruising?: No  Cardiovascular Leg swelling?: No Chest pain?: No  Respiratory Cough?: No Shortness of breath?: No  Endocrine Excessive thirst?: Yes  Musculoskeletal Back pain?: Yes Joint pain?: No  Neurological Headaches?: No Dizziness?: No  Psychologic Depression?: No Anxiety?: No  Physical Exam: BP (!) 146/109   Pulse 62   Ht 5' (1.524 m)   Wt 180 lb (81.6 kg)   LMP 12/01/2016   BMI 35.15 kg/m   Constitutional:  Alert and oriented, no acute distress, nontoxic appearing HEENT: [place], AT Cardiovascular: No clubbing, cyanosis, or edema Respiratory: Normal respiratory effort, no increased work of breathing Skin: No rashes, bruises or suspicious lesions Neurologic: Grossly intact, no focal deficits, moving all 4 extremities Psychiatric: Normal mood and affect  Laboratory Data: Results for orders placed or performed in visit on 06/11/19  Microscopic Examination   URINE  Result Value Ref Range   WBC, UA None seen 0 - 5 /hpf   RBC None seen 0 - 2 /hpf   Epithelial Cells (non renal) 0-10 0 - 10 /hpf   Bacteria, UA Few None seen/Few  Urinalysis, Complete  Result Value Ref Range   Specific Gravity, UA >1.030 (H) 1.005 - 1.030   pH, UA 5.0 5.0 - 7.5   Color, UA Yellow Yellow   Appearance Ur Clear Clear   Leukocytes,UA Negative Negative   Protein,UA Negative  Negative/Trace   Glucose, UA Negative Negative   Ketones, UA Negative Negative   RBC, UA Negative Negative   Bilirubin, UA Negative Negative   Urobilinogen, Ur 1.0 0.2 - 1.0 mg/dL   Nitrite, UA Negative Negative   Microscopic Examination See below:    Assessment & Plan:   1. Flank pain with history of urolithiasis 53 year old female with a history of nephrolithiasis presents today with a 3-week history of constant right flank pain, frequency, and nausea.  UA reassuring for infection, will send for culture to confirm.  No stones visualized on KUB.  Will obtain renal ultrasound for further evaluation and treat as indicated. - Urinalysis, Complete - US RENAL  - CULTURE, URINE COMPREHENSIVE   Return if symptoms worsen or fail to improve.  Debroah Loop, PA-C  [facility] [address], [place], [place] [address] [phone]

## 2019-06-14 LAB — CULTURE, URINE COMPREHENSIVE

## 2019-06-16 ENCOUNTER — Other Ambulatory Visit: Payer: Self-pay | Admitting: Family Medicine

## 2019-06-16 DIAGNOSIS — Z1231 Encounter for screening mammogram for malignant neoplasm of breast: Secondary | ICD-10-CM

## 2019-06-18 ENCOUNTER — Ambulatory Visit
Admission: RE | Admit: 2019-06-18 | Discharge: 2019-06-18 | Disposition: A | Discharge status: Home / Self Care | Payer: Medicare Other | Source: Ambulatory Visit | Attending: Family Medicine | Admitting: Family Medicine

## 2019-06-18 DIAGNOSIS — Z1231 Encounter for screening mammogram for malignant neoplasm of breast: Secondary | ICD-10-CM | POA: Insufficient documentation

## 2019-06-23 ENCOUNTER — Other Ambulatory Visit
Admission: RE | Admit: 2019-06-23 | Discharge: 2019-06-23 | Disposition: A | Discharge status: Home / Self Care | Payer: BC Managed Care – PPO | Source: Ambulatory Visit | Attending: Orthopedic Surgery | Admitting: Orthopedic Surgery

## 2019-06-23 ENCOUNTER — Other Ambulatory Visit: Payer: Self-pay

## 2019-06-23 ENCOUNTER — Ambulatory Visit
Admission: RE | Admit: 2019-06-23 | Discharge: 2019-06-23 | Disposition: A | Discharge status: Home / Self Care | Payer: Medicare Other | Source: Ambulatory Visit | Attending: Physician Assistant | Admitting: Physician Assistant

## 2019-06-23 DIAGNOSIS — Z87442 Personal history of urinary calculi: Secondary | ICD-10-CM | POA: Insufficient documentation

## 2019-06-23 DIAGNOSIS — R109 Unspecified abdominal pain: Secondary | ICD-10-CM | POA: Diagnosis present

## 2019-06-24 ENCOUNTER — Telehealth: Payer: Self-pay | Admitting: Physician Assistant

## 2019-06-24 NOTE — Telephone Encounter (Signed)
Please contact the patient and inform her that her urine culture was negative and renal ultrasound did not show any evidence of an obstructing right-sided stone. I recommend follow-up with her PCP if she remains symptomatic.

## 2019-06-25 ENCOUNTER — Encounter (HOSPITAL_COMMUNITY): Payer: Self-pay | Admitting: Certified Registered Nurse Anesthetist

## 2019-06-25 ENCOUNTER — Other Ambulatory Visit: Payer: Self-pay

## 2019-06-25 ENCOUNTER — Other Ambulatory Visit
Admission: RE | Admit: 2019-06-25 | Discharge: 2019-06-25 | Disposition: A | Discharge status: Home / Self Care | Payer: Medicare Other | Source: Ambulatory Visit | Attending: Orthopedic Surgery | Admitting: Orthopedic Surgery

## 2019-06-25 ENCOUNTER — Encounter (HOSPITAL_COMMUNITY): Payer: Self-pay | Admitting: *Deleted

## 2019-06-25 DIAGNOSIS — Z20822 Contact with and (suspected) exposure to covid-19: Secondary | ICD-10-CM | POA: Insufficient documentation

## 2019-06-25 DIAGNOSIS — Z01812 Encounter for preprocedural laboratory examination: Secondary | ICD-10-CM | POA: Insufficient documentation

## 2019-06-25 NOTE — Progress Notes (Signed)
Pt denies SOB, chest pain, and being under the care of a cardiologist. Pt stated that PCP is Dr. Clayborn Bigness. Pt denies having an echo and cardiac cath but stated that a stress test was performed in 2005. Pt denies having an EKG and chest x ray. Pt denies recent labs. Pt made aware to stop taking vitamins and herbal medications. Pt verbalized understanding of all pre-op instructions.

## 2019-06-25 NOTE — Progress Notes (Signed)
Mariah Reeves, at [facility] Pre-Admission Testing stated that she will reach out to pt to repeat COVID test.

## 2019-06-26 ENCOUNTER — Ambulatory Visit (HOSPITAL_COMMUNITY): Admission: RE | Admit: 2019-06-26 | Payer: BC Managed Care – PPO | Source: Home / Self Care

## 2019-06-26 ENCOUNTER — Telehealth (HOSPITAL_COMMUNITY): Payer: Self-pay

## 2019-06-26 ENCOUNTER — Ambulatory Visit (HOSPITAL_COMMUNITY): Admission: RE | Admit: 2019-06-26 | Payer: BC Managed Care – PPO | Source: Ambulatory Visit

## 2019-06-26 ENCOUNTER — Encounter (HOSPITAL_COMMUNITY): Payer: Self-pay

## 2019-06-26 HISTORY — DX: Other complications of anesthesia, initial encounter: T88.59XA

## 2019-06-26 HISTORY — DX: Gastro-esophageal reflux disease without esophagitis: K21.9

## 2019-06-26 HISTORY — DX: Presence of spectacles and contact lenses: Z97.3

## 2019-06-26 LAB — SARS CORONAVIRUS 2 (TAT 6-24 HRS): SARS Coronavirus 2: NEGATIVE

## 2019-06-26 SURGERY — MRI WITH ANESTHESIA
Anesthesia: General | Laterality: Left

## 2019-06-26 NOTE — Anesthesia Preprocedure Evaluation (Deleted)
Anesthesia Evaluation    Reviewed: Allergy & Precautions, Patient's Chart, lab work & pertinent test results  History of Anesthesia Complications Negative for: history of anesthetic complications  Airway        Dental   Pulmonary sleep apnea ,           Cardiovascular negative cardio ROS       Neuro/Psych  Headaches, PSYCHIATRIC DISORDERS Anxiety Depression Bipolar Disorder  B/l occipital neuralgia Vertigo      GI/Hepatic Neg liver ROS, GERD  , S/p gastric bypass    Endo/Other   Obesity   Renal/GU negative Renal ROS     Musculoskeletal  (+) Arthritis ,  Chronic back pain    Abdominal   Peds  Hematology negative hematology ROS (+)   Anesthesia Other Findings Covid neg 1/13   Reproductive/Obstetrics                             Anesthesia Physical Anesthesia Plan  ASA: II  Anesthesia Plan: General   Post-op Pain Management:    Induction: Intravenous  PONV Risk Score and Plan: 3 and Treatment may vary due to age or medical condition, Ondansetron and Midazolam  Airway Management Planned: Oral ETT  Additional Equipment: None  Intra-op Plan:   Post-operative Plan: Extubation in OR  Informed Consent:   Plan Discussed with: CRNA and Anesthesiologist  Anesthesia Plan Comments:         Anesthesia Quick Evaluation

## 2019-06-26 NOTE — Progress Notes (Signed)
TC from admitting stating the patient drove herself to the hospital and did not have anyone to drive her home or stay with her. Advised patient that she may have MRI without sedation today. Patient elected to cancel. Patient instructed to contact ordering physician to reschedule. Call placed to OR front desk, anesthesiologist, charge CRNA, and MRI to inform them of patient 's decision.

## 2019-07-07 ENCOUNTER — Ambulatory Visit (HOSPITAL_COMMUNITY): Payer: BC Managed Care – PPO

## 2019-07-14 ENCOUNTER — Other Ambulatory Visit: Payer: Self-pay

## 2019-07-14 ENCOUNTER — Encounter (HOSPITAL_COMMUNITY): Payer: Self-pay | Admitting: *Deleted

## 2019-07-14 ENCOUNTER — Ambulatory Visit
Admission: RE | Admit: 2019-07-14 | Discharge: 2019-07-14 | Disposition: A | Discharge status: Home / Self Care | Payer: Medicare Other | Source: Ambulatory Visit | Attending: Orthopedic Surgery | Admitting: Orthopedic Surgery

## 2019-07-14 DIAGNOSIS — Z01812 Encounter for preprocedural laboratory examination: Secondary | ICD-10-CM | POA: Diagnosis present

## 2019-07-14 DIAGNOSIS — Z20822 Contact with and (suspected) exposure to covid-19: Secondary | ICD-10-CM | POA: Diagnosis not present

## 2019-07-14 NOTE — Progress Notes (Signed)
Ms Weaber denies chest or shortness of breath. Patient  Denies s/s of Covid, she was tested today and is in quarantine.  Ms Fenner reports that she had labs drawn at [facility] "last week" , I requested labs, last office note.

## 2019-07-15 LAB — SARS CORONAVIRUS 2 (TAT 6-24 HRS): SARS Coronavirus 2: NEGATIVE

## 2019-07-17 ENCOUNTER — Encounter (HOSPITAL_COMMUNITY): Payer: Self-pay

## 2019-07-17 ENCOUNTER — Telehealth: Payer: Self-pay

## 2019-07-17 ENCOUNTER — Ambulatory Visit: Admit: 2019-07-17 | Payer: BC Managed Care – PPO

## 2019-07-17 ENCOUNTER — Ambulatory Visit (HOSPITAL_COMMUNITY): Payer: BC Managed Care – PPO

## 2019-07-17 HISTORY — DX: Post-traumatic stress disorder, unspecified: F43.10

## 2019-07-17 SURGERY — MRI WITH ANESTHESIA
Anesthesia: General

## 2019-07-17 NOTE — Telephone Encounter (Signed)
Pt called yesterday to get a little information about the labia bx that she will be having done on 07/23/19. I have called and informed pt on the way that procedure works and there is always a chance they will not feel it is necessary. Pt grateful and understands.

## 2019-07-23 ENCOUNTER — Other Ambulatory Visit: Payer: Self-pay

## 2019-07-23 ENCOUNTER — Ambulatory Visit (INDEPENDENT_AMBULATORY_CARE_PROVIDER_SITE_OTHER): Payer: Medicare Other | Admitting: Obstetrics and Gynecology

## 2019-07-23 ENCOUNTER — Encounter: Payer: Self-pay | Admitting: Obstetrics and Gynecology

## 2019-07-23 VITALS — BP 151/100 | HR 57 | Ht 60.0 in | Wt 180.0 lb

## 2019-07-23 DIAGNOSIS — R030 Elevated blood-pressure reading, without diagnosis of hypertension: Secondary | ICD-10-CM

## 2019-07-23 DIAGNOSIS — N952 Postmenopausal atrophic vaginitis: Secondary | ICD-10-CM

## 2019-07-23 DIAGNOSIS — L298 Other pruritus: Secondary | ICD-10-CM | POA: Diagnosis not present

## 2019-07-23 DIAGNOSIS — N898 Other specified noninflammatory disorders of vagina: Secondary | ICD-10-CM

## 2019-07-23 MED ORDER — ESTROGENS, CONJUGATED 0.625 MG/GM VA CREA: TOPICAL_CREAM | VAGINAL | 0 refills | Status: DC

## 2019-07-23 NOTE — Progress Notes (Signed)
Obstetrics & Gynecology Office Visit   Chief Complaint:  Chief Complaint  Patient presents with  . Vaginal lesion    referred by PCP    History of Present Illness: 54 y.o. postmenopausal female presenting with vaginal irritation and rash.  She was seen by her PCP.  She reports that she was evaluated by her PCP for this complaint and reports that she had cultures sent which were negative (although I do not have records to review to see if HSV culture was also collected).  The lesions were described as ulcerative by the patient.  She also had concomitant dysuria.  She states since initial onset, symptoms have improved although the area still feels irritated.  There is no prior history of HSV.  Denies similar symptoms previously. Does state she has suspicion her ex-husband may have been unfaithful in the past prior to their separation.  Review of Systems: Review of Systems  Constitutional: Negative.   Genitourinary: Negative.   Skin: Positive for itching and rash.   Past Medical History:  Past Medical History:  Diagnosis Date  . Allergy   . Anomaly, uterus    tumor of uterus  . Anxiety   . Bilateral occipital neuralgia   . Bipolar 1 disorder (HCC)   . Chronic back pain   . Complication of anesthesia    hard time waking up after gastric bypass.Pt stated that she has a small mouth and has had several surgeries since gastric bypass with no problems.  . Degenerative disc disease, lumbar   . Depression   . Facet syndrome, lumbar   . GERD (gastroesophageal reflux disease)    before gastric bypass  . Headache    Migraines  . History of kidney stones    Left  . Hx of vertigo   . Patella fracture    Left  . PTSD (post-traumatic stress disorder)   . Sleep apnea    C-PAP  . Wears glasses     Past Surgical History:  Past Surgical History:  Procedure Laterality Date  . ABDOMINOPLASTY  2006  . CARPAL TUNNEL RELEASE Left   . CHOLECYSTECTOMY    . COLONOSCOPY W/ POLYPECTOMY      . CYSTOSCOPY W/ URETERAL STENT PLACEMENT  01/03/2017   Procedure: CYSTOSCOPY WITH RETROGRADE PYELOGRAM/URETERAL STENT PLACEMENT;  Surgeon: Hildred Laser, MD;  Location: [facility];  Service: Urology;;  . Bluford Kaufmann W/ URETERAL STENT PLACEMENT Left 01/17/2017   Procedure: CYSTOSCOPY WITH STENT REPLACEMENT;  Surgeon: Hildred Laser, MD;  Location: [facility];  Service: Urology;  Laterality: Left;  . DILATATION & CURETTAGE/HYSTEROSCOPY WITH MYOSURE N/A 07/08/2015   Procedure: DILATATION & CURETTAGE/HYSTEROSCOPY WITH MYOSURE;  Surgeon: Nadara Mustard, MD;  Location: [facility];  Service: Gynecology;  Laterality: N/A;  . DILATION AND CURETTAGE OF UTERUS     1/17  . EXTRACORPOREAL SHOCK WAVE LITHOTRIPSY Left 12/07/2016   Procedure: EXTRACORPOREAL SHOCK WAVE LITHOTRIPSY (ESWL);  Surgeon: Hildred Laser, MD;  Location: [facility];  Service: Urology;  Laterality: Left;  Marland Kitchen GASTRIC BYPASS  2005  . KNEE SURGERY Right    ACL- repair  . LITHOTRIPSY    . MIDDLE EAR SURGERY    . NASAL SEPTUM SURGERY    . URETEROSCOPY WITH HOLMIUM LASER LITHOTRIPSY     perforated ureter. had nephrostomy tube for brief time  . URETEROSCOPY WITH HOLMIUM LASER LITHOTRIPSY Left 01/17/2017   Procedure: URETEROSCOPY WITH HOLMIUM LASER LITHOTRIPSY;  Surgeon: Hildred Laser, MD;  Location: [facility];  Service: Urology;  Laterality: Left;    Gynecologic History: Patient's last menstrual period was 12/01/2016.  Obstetric History: G0P0000  Family History:  Family History  Problem Relation Age of Onset  . Hypertension Mother   . Diabetes Mother   . Heart disease Father   . Alcohol abuse Father   . Breast cancer Maternal Grandmother   . Kidney cancer Neg Hx   . Bladder Cancer Neg Hx     Social History:  Social History   Socioeconomic History  . Marital status: Divorced    Spouse name: Not on file  . Number of children: Not on file  . Years of education: Not on file  . Highest education level: Not on file   Occupational History  . Not on file  Tobacco Use  . Smoking status: Never Smoker  . Smokeless tobacco: Never Used  Substance and Sexual Activity  . Alcohol use: No  . Drug use: No  . Sexual activity: Never    Birth control/protection: None  Other Topics Concern  . Not on file  Social History Narrative  . Not on file   Social Determinants of Health   Financial Resource Strain:   . Difficulty of Paying Living Expenses: Not on file  Food Insecurity:   . Worried About Programme researcher, broadcasting/film/video in the Last Year: Not on file  . Ran Out of Food in the Last Year: Not on file  Transportation Needs:   . Lack of Transportation (Medical): Not on file  . Lack of Transportation (Non-Medical): Not on file  Physical Activity:   . Days of Exercise per Week: Not on file  . Minutes of Exercise per Session: Not on file  Stress:   . Feeling of Stress : Not on file  Social Connections:   . Frequency of Communication with Friends and Family: Not on file  . Frequency of Social Gatherings with Friends and Family: Not on file  . Attends Religious Services: Not on file  . Active Member of Clubs or Organizations: Not on file  . Attends Banker Meetings: Not on file  . Marital Status: Not on file  Intimate Partner Violence:   . Fear of Current or Ex-Partner: Not on file  . Emotionally Abused: Not on file  . Physically Abused: Not on file  . Sexually Abused: Not on file    Allergies:  Allergies  Allergen Reactions  . Contrast Media [Iodinated Diagnostic Agents] Hives  . Carbetapentane Hives  . Tape Hives    Adhesive Tape  . Trazodone And Nefazodone     Sleep walking    Medications: Prior to Admission medications   Medication Sig Start Date End Date Taking? Authorizing Provider  diphenhydramine-acetaminophen (TYLENOL PM) 25-500 MG TABS tablet Take 2 tablets by mouth at bedtime as needed (Sleep).   Yes [provider]  escitalopram (LEXAPRO) 20 MG tablet Take 20 mg by  mouth at bedtime.   Yes [provider]  gabapentin (NEURONTIN) 300 MG capsule Take 600 mg by mouth at bedtime.   Yes [provider]  HYDROcodone-acetaminophen (NORCO) 10-325 MG tablet Take 1 tablet by mouth every 4 (four) hours as needed for moderate pain.    Yes [provider]  lamoTRIgine (LAMICTAL) 200 MG tablet Take 400 mg by mouth at bedtime.    Yes [provider]  methocarbamol (ROBAXIN) 500 MG tablet Take 250-500 mg by mouth daily.   Yes [provider]  ondansetron (ZOFRAN-ODT) 8 MG disintegrating tablet  Take 8 mg by mouth every 8 (eight) hours as needed for nausea or vomiting.   Yes [provider]  Sodium Fluoride (CLINPRO 5000) 1.1 % PSTE Place 1 application onto teeth daily.    Yes [provider]  VOLTAREN 1 % GEL Apply 1 application topically daily as needed (Pain).  01/06/19  Yes [provider]    Physical Exam Vitals:  Vitals:   07/23/19 1334  BP: (!) 151/100  Pulse: (!) 57   Patient's last menstrual period was 12/01/2016.  General: NAD, well nourished, appears stated age 37: normocephalic, anicteric Pulmonary: No increased work of breathing Genitourinary:  External: No ulcerative lesions are noted, there is mild erythema noted at the introitus. Hyopestrogenic changes are pronounce with loss of definition in the labia minor and partial fusion of the labia minora and major posteriorly.  Normal urethral meatus, normal Bartholin's and Skene's glands.    Vagina: loss of vaginal rugae noted, no evidence of prolapse.   Extremities: no edema, erythema, or tenderness Neurologic: Grossly intact Psychiatric: mood appropriate, affect full  Female chaperone present for pelvic  portions of the physical exam  Assessment: 54 y.o. G0P0000 with atrophic vaginitis   Plan: Problem List Items Addressed This Visit    None    Visit Diagnoses    Vaginal atrophy    -  Primary     1) Vaginal atrophy - no  visible lesions on exam today, HSV remains in the differential.  Discussed antibodies are not helpful in distinguishing between oral and genital HSV and are positive in about 60% of the population. - premarin trial for vulvovaginal atrophy - should symptoms reucr told patient to represent for HSV culture  2) A total of 15 minutes were spent in face-to-face contact with the patient during this encounter with over half of that time devoted to counseling and coordination of care.  3) Return in about 6 weeks (around 09/03/2019) for medication follow up in person.   Malachy Mood, MD, [facility], [facility] 07/23/2019, 1:40 PM

## 2019-07-28 ENCOUNTER — Encounter: Payer: Self-pay | Admitting: Obstetrics and Gynecology

## 2019-08-08 ENCOUNTER — Other Ambulatory Visit: Payer: Self-pay | Admitting: Orthopedic Surgery

## 2019-08-28 ENCOUNTER — Other Ambulatory Visit: Payer: Self-pay

## 2019-08-28 ENCOUNTER — Other Ambulatory Visit
Admission: RE | Admit: 2019-08-28 | Discharge: 2019-08-28 | Disposition: A | Discharge status: Home / Self Care | Payer: BC Managed Care – PPO | Source: Ambulatory Visit | Attending: Orthopedic Surgery | Admitting: Orthopedic Surgery

## 2019-08-28 ENCOUNTER — Other Ambulatory Visit: Payer: Self-pay | Admitting: Orthopedic Surgery

## 2019-08-28 NOTE — Patient Instructions (Signed)
Your procedure is scheduled on: September 02, 2019 Tuesday  Report to Day Surgery on the 2nd floor of the [facility]. To find out your arrival time, please call [phone] between 1PM - 3PM on: September 01, 2019 Monday   REMEMBER: Instructions that are not followed completely may result in serious medical risk, up to and including death; or upon the discretion of your surgeon and anesthesiologist your surgery may need to be rescheduled.  Do not eat food after midnight the night before surgery.  No gum chewing, lozengers or hard candies.  You may however, drink CLEAR liquids up to 2 hours before you are scheduled to arrive for your surgery. Do not drink anything within 2 hours of the start of your surgery.  Clear liquids include: - water  - apple juice without pulp - gatorade (not RED) - black coffee or tea (Do NOT add milk or creamers to the coffee or tea) Do NOT drink anything that is not on this list.  Type 1 and Type 2 diabetics should only drink water.    TAKE THESE MEDICATIONS THE MORNING OF SURGERY WITH A SIP OF WATER: NONE  Follow recommendations from Cardiologist, Pulmonologist or PCP regarding stopping Aspirin, Coumadin, Plavix, Eliquis, Pradaxa, or Pletal.  Stop Anti-inflammatories (NSAIDS) such as Advil, Aleve, Ibuprofen, Motrin, Naproxen, Naprosyn and Aspirin based products such as Excedrin, Goodys Powder, BC Powder. (May take Tylenol or Acetaminophen if needed.)  Stop ANY OVER THE COUNTER supplements until after surgery. (May continue Vitamin D, Vitamin B, and multivitamin.)  No Alcohol for 24 hours before or after surgery.  No Smoking including e-cigarettes for 24 hours prior to surgery.  No chewable tobacco products for at least 6 hours prior to surgery.  No nicotine patches on the day of surgery.  On the morning of surgery brush your teeth with toothpaste and water, you may rinse your mouth with mouthwash if you wish. Do not swallow any toothpaste or  mouthwash.  Do not wear jewelry, make-up, hairpins, clips or nail polish.  Do not wear lotions, powders, or perfumes.   Do not shave 48 hours prior to surgery.   Contact lenses, hearing aids and dentures may not be worn into surgery.  Do not bring valuables to the hospital, including drivers license, insurance or credit cards.  [facility] is not responsible for any belongings or valuables.   Use CHG Soap as directed on instruction sheet.  Notify your doctor if there is any change in your medical condition (cold, fever, infection).  Wear comfortable clothing (specific to your surgery type) to the hospital.  Plan for stool softeners for home use.  If you are being discharged the day of surgery, you will not be allowed to drive home. You will need a responsible adult to drive you home and stay with you that night.   If you are taking public transportation, you will need to have a responsible adult with you. Please confirm with your physician that it is acceptable to use public transportation.   Please call [phone] if you have any questions about these instructions.  Visitation Policy:  Patients undergoing a surgery or procedure in a hospital may have one family member or support person with them as long as that person is not COVID-19 positive or experiencing its symptoms. That person may remain in the waiting area during the procedure. Should the patient need to stay at the hospital during part of their recovery, the support person may visit during visiting hours;  10 am to 8 pm.  Children under 56 years of age may have both parents or legal guardians with them during their hospital stay.

## 2019-08-29 ENCOUNTER — Other Ambulatory Visit
Admission: RE | Admit: 2019-08-29 | Discharge: 2019-08-29 | Disposition: A | Discharge status: Home / Self Care | Payer: Medicare Other | Source: Ambulatory Visit | Attending: Orthopedic Surgery | Admitting: Orthopedic Surgery

## 2019-08-29 DIAGNOSIS — Z20822 Contact with and (suspected) exposure to covid-19: Secondary | ICD-10-CM | POA: Insufficient documentation

## 2019-08-29 DIAGNOSIS — Z01812 Encounter for preprocedural laboratory examination: Secondary | ICD-10-CM | POA: Diagnosis present

## 2019-08-29 LAB — CBC WITH DIFFERENTIAL/PLATELET
Abs Immature Granulocytes: 0.02 10*3/uL (ref 0.00–0.07)
Basophils Absolute: 0.1 10*3/uL (ref 0.0–0.1)
Basophils Relative: 1 %
Eosinophils Absolute: 0.2 10*3/uL (ref 0.0–0.5)
Eosinophils Relative: 3 %
HCT: 39.7 % (ref 36.0–46.0)
Hemoglobin: 12.8 g/dL (ref 12.0–15.0)
Immature Granulocytes: 0 %
Lymphocytes Relative: 36 %
Lymphs Abs: 2.2 10*3/uL (ref 0.7–4.0)
MCH: 27.2 pg (ref 26.0–34.0)
MCHC: 32.2 g/dL (ref 30.0–36.0)
MCV: 84.5 fL (ref 80.0–100.0)
Monocytes Absolute: 0.4 10*3/uL (ref 0.1–1.0)
Monocytes Relative: 7 %
Neutro Abs: 3.2 10*3/uL (ref 1.7–7.7)
Neutrophils Relative %: 53 %
Platelets: 418 10*3/uL — ABNORMAL HIGH (ref 150–400)
RBC: 4.7 MIL/uL (ref 3.87–5.11)
RDW: 15 % (ref 11.5–15.5)
WBC: 6.1 10*3/uL (ref 4.0–10.5)
nRBC: 0 % (ref 0.0–0.2)

## 2019-08-29 LAB — PROTIME-INR
INR: 1 (ref 0.8–1.2)
Prothrombin Time: 13.4 seconds (ref 11.4–15.2)

## 2019-08-29 LAB — BASIC METABOLIC PANEL
Anion gap: 13 (ref 5–15)
BUN: 12 mg/dL (ref 6–20)
CO2: 26 mmol/L (ref 22–32)
Calcium: 9 mg/dL (ref 8.9–10.3)
Chloride: 102 mmol/L (ref 98–111)
Creatinine, Ser: 0.72 mg/dL (ref 0.44–1.00)
GFR calc Af Amer: >60 mL/min (ref >60)
GFR calc non Af Amer: >60 mL/min (ref >60)
Glucose, Bld: 100 mg/dL — ABNORMAL HIGH (ref 70–99)
Potassium: 3.1 mmol/L — ABNORMAL LOW (ref 3.5–5.1)
Sodium: 141 mmol/L (ref 135–145)

## 2019-08-29 LAB — APTT: aPTT: 30 seconds (ref 24–36)

## 2019-08-30 LAB — SARS CORONAVIRUS 2 (TAT 6-24 HRS): SARS Coronavirus 2: NEGATIVE

## 2019-09-01 ENCOUNTER — Other Ambulatory Visit: Payer: BC Managed Care – PPO

## 2019-09-02 ENCOUNTER — Ambulatory Visit
Admission: RE | Admit: 2019-09-02 | Discharge: 2019-09-02 | Disposition: A | Discharge status: Home / Self Care | Payer: Medicare Other | Attending: Orthopedic Surgery | Admitting: Orthopedic Surgery

## 2019-09-02 ENCOUNTER — Other Ambulatory Visit: Payer: Self-pay

## 2019-09-02 ENCOUNTER — Ambulatory Visit: Payer: Medicare Other | Admitting: Certified Registered Nurse Anesthetist

## 2019-09-02 ENCOUNTER — Ambulatory Visit: Payer: Medicare Other

## 2019-09-02 ENCOUNTER — Encounter: Payer: Self-pay | Admitting: Orthopedic Surgery

## 2019-09-02 ENCOUNTER — Encounter
Admission: RE | Disposition: A | Discharge status: Home / Self Care | Payer: Self-pay | Source: Home / Self Care | Attending: Orthopedic Surgery

## 2019-09-02 DIAGNOSIS — F419 Anxiety disorder, unspecified: Secondary | ICD-10-CM | POA: Insufficient documentation

## 2019-09-02 DIAGNOSIS — Z888 Allergy status to other drugs, medicaments and biological substances status: Secondary | ICD-10-CM | POA: Insufficient documentation

## 2019-09-02 DIAGNOSIS — M75122 Complete rotator cuff tear or rupture of left shoulder, not specified as traumatic: Secondary | ICD-10-CM | POA: Diagnosis not present

## 2019-09-02 DIAGNOSIS — F319 Bipolar disorder, unspecified: Secondary | ICD-10-CM | POA: Insufficient documentation

## 2019-09-02 DIAGNOSIS — M75112 Incomplete rotator cuff tear or rupture of left shoulder, not specified as traumatic: Secondary | ICD-10-CM | POA: Diagnosis present

## 2019-09-02 DIAGNOSIS — Z419 Encounter for procedure for purposes other than remedying health state, unspecified: Secondary | ICD-10-CM

## 2019-09-02 DIAGNOSIS — M19012 Primary osteoarthritis, left shoulder: Secondary | ICD-10-CM | POA: Insufficient documentation

## 2019-09-02 DIAGNOSIS — G4730 Sleep apnea, unspecified: Secondary | ICD-10-CM | POA: Diagnosis not present

## 2019-09-02 DIAGNOSIS — Z79899 Other long term (current) drug therapy: Secondary | ICD-10-CM | POA: Diagnosis not present

## 2019-09-02 DIAGNOSIS — Z91041 Radiographic dye allergy status: Secondary | ICD-10-CM | POA: Diagnosis not present

## 2019-09-02 DIAGNOSIS — Z9884 Bariatric surgery status: Secondary | ICD-10-CM | POA: Diagnosis not present

## 2019-09-02 LAB — POCT I-STAT, CHEM 8
BUN: 10 mg/dL (ref 6–20)
Calcium, Ion: 1.11 mmol/L — ABNORMAL LOW (ref 1.15–1.40)
Chloride: 102 mmol/L (ref 98–111)
Creatinine, Ser: 0.7 mg/dL (ref 0.44–1.00)
Glucose, Bld: 92 mg/dL (ref 70–99)
HCT: 37 % (ref 36.0–46.0)
Hemoglobin: 12.6 g/dL (ref 12.0–15.0)
Potassium: 3.1 mmol/L — ABNORMAL LOW (ref 3.5–5.1)
Sodium: 139 mmol/L (ref 135–145)
TCO2: 25 mmol/L (ref 22–32)

## 2019-09-02 SURGERY — SHOULDER ARTHROSCOPY WITH SUBACROMIAL DECOMPRESSION AND DISTAL CLAVICLE EXCISION
Anesthesia: General | Laterality: Left

## 2019-09-02 MED ORDER — BUPIVACAINE HCL (PF) 0.5 % IJ SOLN
INTRAMUSCULAR | Status: AC
  Filled 2019-09-02: qty 10

## 2019-09-02 MED ORDER — FENTANYL CITRATE (PF) 100 MCG/2ML IJ SOLN: 50.0000 ug | INTRAMUSCULAR | Status: DC | PRN

## 2019-09-02 MED ORDER — SODIUM CHLORIDE 0.9 % IV SOLN
INTRAVENOUS | Status: DC | PRN
  Administered 2019-09-02: 20 ug/min via INTRAVENOUS

## 2019-09-02 MED ORDER — FENTANYL CITRATE (PF) 100 MCG/2ML IJ SOLN
INTRAMUSCULAR | Status: AC
  Filled 2019-09-02: qty 2

## 2019-09-02 MED ORDER — ROCURONIUM BROMIDE 100 MG/10ML IV SOLN
INTRAVENOUS | Status: DC | PRN
  Administered 2019-09-02: 40 mg via INTRAVENOUS

## 2019-09-02 MED ORDER — FENTANYL CITRATE (PF) 100 MCG/2ML IJ SOLN
INTRAMUSCULAR | Status: AC
  Administered 2019-09-02: 10:00:00 50 ug via INTRAVENOUS
  Filled 2019-09-02: qty 2

## 2019-09-02 MED ORDER — SUGAMMADEX SODIUM 200 MG/2ML IV SOLN
INTRAVENOUS | Status: DC | PRN
  Administered 2019-09-02: 200 mg via INTRAVENOUS

## 2019-09-02 MED ORDER — DEXAMETHASONE SODIUM PHOSPHATE 10 MG/ML IJ SOLN
INTRAMUSCULAR | Status: DC | PRN
  Administered 2019-09-02: 8 mg via INTRAVENOUS

## 2019-09-02 MED ORDER — BUPIVACAINE HCL (PF) 0.5 % IJ SOLN
INTRAMUSCULAR | Status: DC | PRN
  Administered 2019-09-02: 10 mL via PERINEURAL

## 2019-09-02 MED ORDER — LACTATED RINGERS IV SOLN: INTRAVENOUS | Status: DC

## 2019-09-02 MED ORDER — ACETAMINOPHEN 10 MG/ML IV SOLN
INTRAVENOUS | Status: DC | PRN
  Administered 2019-09-02: 1000 mg via INTRAVENOUS

## 2019-09-02 MED ORDER — FENTANYL CITRATE (PF) 100 MCG/2ML IJ SOLN
INTRAMUSCULAR | Status: DC | PRN
  Administered 2019-09-02: 100 ug via INTRAVENOUS

## 2019-09-02 MED ORDER — PROPOFOL 10 MG/ML IV BOLUS
INTRAVENOUS | Status: AC
  Filled 2019-09-02: qty 20

## 2019-09-02 MED ORDER — CEFAZOLIN SODIUM-DEXTROSE 2-4 GM/100ML-% IV SOLN
2.0000 g | INTRAVENOUS | Status: AC
  Administered 2019-09-02: 2 g via INTRAVENOUS

## 2019-09-02 MED ORDER — FENTANYL CITRATE (PF) 100 MCG/2ML IJ SOLN: 25.0000 ug | INTRAMUSCULAR | Status: DC | PRN

## 2019-09-02 MED ORDER — CHLORHEXIDINE GLUCONATE CLOTH 2 % EX PADS
6.0000 | MEDICATED_PAD | Freq: Once | CUTANEOUS | Status: AC
  Administered 2019-09-02: 6 via TOPICAL

## 2019-09-02 MED ORDER — ONDANSETRON HCL 4 MG/2ML IJ SOLN
INTRAMUSCULAR | Status: AC
  Filled 2019-09-02: qty 2

## 2019-09-02 MED ORDER — CEFAZOLIN SODIUM-DEXTROSE 2-4 GM/100ML-% IV SOLN
INTRAVENOUS | Status: AC
  Filled 2019-09-02: qty 100

## 2019-09-02 MED ORDER — LIDOCAINE HCL (PF) 1 % IJ SOLN
INTRAMUSCULAR | Status: DC | PRN
  Administered 2019-09-02: 3 mL

## 2019-09-02 MED ORDER — MIDAZOLAM HCL 2 MG/2ML IJ SOLN
INTRAMUSCULAR | Status: AC
  Administered 2019-09-02: 10:00:00 1 mg via INTRAVENOUS
  Filled 2019-09-02: qty 2

## 2019-09-02 MED ORDER — PROPOFOL 10 MG/ML IV BOLUS
INTRAVENOUS | Status: DC | PRN
  Administered 2019-09-02: 150 mg via INTRAVENOUS

## 2019-09-02 MED ORDER — MIDAZOLAM HCL 2 MG/2ML IJ SOLN: 1.0000 mg | INTRAMUSCULAR | Status: DC | PRN

## 2019-09-02 MED ORDER — ONDANSETRON HCL 4 MG/2ML IJ SOLN: 4.0000 mg | Freq: Once | INTRAMUSCULAR | Status: DC | PRN

## 2019-09-02 MED ORDER — FAMOTIDINE 20 MG PO TABS
ORAL_TABLET | ORAL | Status: AC
  Filled 2019-09-02: qty 1

## 2019-09-02 MED ORDER — LIDOCAINE HCL (PF) 1 % IJ SOLN: INTRAMUSCULAR | Status: DC | PRN

## 2019-09-02 MED ORDER — ACETAMINOPHEN 10 MG/ML IV SOLN
INTRAVENOUS | Status: AC
  Filled 2019-09-02: qty 100

## 2019-09-02 MED ORDER — DEXAMETHASONE SODIUM PHOSPHATE 10 MG/ML IJ SOLN
INTRAMUSCULAR | Status: AC
  Filled 2019-09-02: qty 1

## 2019-09-02 MED ORDER — PHENYLEPHRINE HCL (PRESSORS) 10 MG/ML IV SOLN
INTRAVENOUS | Status: DC | PRN
  Administered 2019-09-02: 100 ug via INTRAVENOUS

## 2019-09-02 MED ORDER — FAMOTIDINE 20 MG PO TABS: 20.0000 mg | ORAL_TABLET | Freq: Once | ORAL | Status: DC

## 2019-09-02 MED ORDER — BUPIVACAINE LIPOSOME 1.3 % IJ SUSP
INTRAMUSCULAR | Status: DC | PRN
  Administered 2019-09-02: 20 mL via PERINEURAL

## 2019-09-02 MED ORDER — ROCURONIUM BROMIDE 10 MG/ML (PF) SYRINGE
PREFILLED_SYRINGE | INTRAVENOUS | Status: AC
  Filled 2019-09-02: qty 10

## 2019-09-02 MED ORDER — CHLORHEXIDINE GLUCONATE CLOTH 2 % EX PADS: 6.0000 | MEDICATED_PAD | Freq: Once | CUTANEOUS | Status: DC

## 2019-09-02 MED ORDER — LIDOCAINE HCL (CARDIAC) PF 100 MG/5ML IV SOSY
PREFILLED_SYRINGE | INTRAVENOUS | Status: DC | PRN
  Administered 2019-09-02: 60 mg via INTRAVENOUS

## 2019-09-02 MED ORDER — ONDANSETRON HCL 4 MG/2ML IJ SOLN
INTRAMUSCULAR | Status: DC | PRN
  Administered 2019-09-02: 4 mg via INTRAVENOUS

## 2019-09-02 MED ORDER — LIDOCAINE HCL (PF) 1 % IJ SOLN
INTRAMUSCULAR | Status: AC
  Filled 2019-09-02: qty 5

## 2019-09-02 MED ORDER — BUPIVACAINE LIPOSOME 1.3 % IJ SUSP
INTRAMUSCULAR | Status: AC
  Filled 2019-09-02: qty 20

## 2019-09-02 SURGICAL SUPPLY — 76 items
ADAPTER IRRIG TUBE 2 SPIKE SOL (ADAPTER) ×6 IMPLANT
ADPR TBG 2 SPK PMP STRL ASCP (ADAPTER) ×2
ANCH SUT 5.5 KNTLS PEEK (Orthopedic Implant) ×1 IMPLANT
ANCH SUT Q-FX 2.8 (Anchor) ×1 IMPLANT
ANCHOR ALL-SUT Q-FIX 2.8 (Anchor) ×6 IMPLANT
ANCHOR SUT 5.5 MULTIFIX (Orthopedic Implant) ×1 IMPLANT
ANCHOR SUT 5.5MM MULTIFIX (Orthopedic Implant) ×1 IMPLANT
BUR RADIUS 4.0X18.5 (BURR) ×3 IMPLANT
BUR RADIUS 5.5 (BURR) ×3 IMPLANT
CANISTER SUCT LVC 12 LTR MEDI- (MISCELLANEOUS) ×3 IMPLANT
CANNULA 5.75X7 CRYSTAL CLEAR (CANNULA) ×6 IMPLANT
CANNULA PARTIAL THREAD 2X7 (CANNULA) ×3 IMPLANT
CANNULA TWIST IN 8.25X9CM (CANNULA) ×6 IMPLANT
CLOSURE WOUND 1/2 X4 (GAUZE/BANDAGES/DRESSINGS) ×1
CONNECTOR PERFECT PASSER (CONNECTOR) ×4 IMPLANT
COOLER POLAR GLACIER W/PUMP (MISCELLANEOUS) ×3 IMPLANT
COUNTER NEEDLE 20/40 LG (NEEDLE) ×2 IMPLANT
COVER WAND RF STERILE (DRAPES) ×3 IMPLANT
CRADLE LAMINECT ARM (MISCELLANEOUS) ×6 IMPLANT
DEVICE SUCT BLK HOLE OR FLOOR (MISCELLANEOUS) ×4 IMPLANT
DRAPE 3/4 80X56 (DRAPES) ×3 IMPLANT
DRAPE IMP U-DRAPE 54X76 (DRAPES) ×6 IMPLANT
DRAPE INCISE IOBAN 66X45 STRL (DRAPES) ×3 IMPLANT
DRAPE U-SHAPE 47X51 STRL (DRAPES) ×3 IMPLANT
DURAPREP 26ML APPLICATOR (WOUND CARE) ×9 IMPLANT
ELECT REM PT RETURN 9FT ADLT (ELECTROSURGICAL) ×3
ELECTRODE REM PT RTRN 9FT ADLT (ELECTROSURGICAL) ×1 IMPLANT
GAUZE SPONGE 4X4 12PLY STRL (GAUZE/BANDAGES/DRESSINGS) ×3 IMPLANT
GAUZE XEROFORM 1X8 LF (GAUZE/BANDAGES/DRESSINGS) ×3 IMPLANT
GLOVE BIOGEL PI IND STRL 9 (GLOVE) ×1 IMPLANT
GLOVE BIOGEL PI INDICATOR 9 (GLOVE) ×2
GLOVE SURG 9.0 ORTHO LTXF (GLOVE) ×9 IMPLANT
GOWN STRL REUS TWL 2XL XL LVL4 (GOWN DISPOSABLE) ×3 IMPLANT
GOWN STRL REUS W/ TWL LRG LVL3 (GOWN DISPOSABLE) ×1 IMPLANT
GOWN STRL REUS W/TWL LRG LVL3 (GOWN DISPOSABLE) ×3
IV LACTATED RINGER IRRG 3000ML (IV SOLUTION) ×24
IV LR IRRIG 3000ML ARTHROMATIC (IV SOLUTION) ×8 IMPLANT
KIT STABILIZATION SHOULDER (MISCELLANEOUS) ×3 IMPLANT
KIT SUTURE 2.8 Q-FIX DISP (MISCELLANEOUS) ×3 IMPLANT
KIT SUTURETAK 3.0 INSERT PERC (KITS) IMPLANT
KIT TURNOVER KIT A (KITS) ×3 IMPLANT
MANIFOLD NEPTUNE II (INSTRUMENTS) ×3 IMPLANT
MASK FACE SPIDER DISP (MASK) ×3 IMPLANT
MAT ABSORB  FLUID 56X50 GRAY (MISCELLANEOUS) ×9
MAT ABSORB FLUID 56X50 GRAY (MISCELLANEOUS) ×3 IMPLANT
NDL SAFETY ECLIPSE 18X1.5 (NEEDLE) ×1 IMPLANT
NEEDLE HYPO 18GX1.5 SHARP (NEEDLE) ×3
NEEDLE HYPO 22GX1.5 SAFETY (NEEDLE) ×3 IMPLANT
NS IRRIG 500ML POUR BTL (IV SOLUTION) ×3 IMPLANT
PACK ARTHROSCOPY SHOULDER (MISCELLANEOUS) ×3 IMPLANT
PAD WRAPON POLAR SHDR XLG (MISCELLANEOUS) ×1 IMPLANT
PASSER SUT CAPTURE FIRST (SUTURE) ×3 IMPLANT
PASSER SUT FIRSTPASS SELF (INSTRUMENTS) ×2 IMPLANT
SET TUBE SUCT SHAVER OUTFL 24K (TUBING) ×3 IMPLANT
SET TUBE TIP INTRA-ARTICULAR (MISCELLANEOUS) ×3 IMPLANT
STRIP CLOSURE SKIN 1/2X4 (GAUZE/BANDAGES/DRESSINGS) ×2 IMPLANT
SUT ETHILON 4-0 (SUTURE) ×3
SUT ETHILON 4-0 FS2 18XMFL BLK (SUTURE) ×1
SUT LASSO 90 DEG SD STR (SUTURE) IMPLANT
SUT MNCRL 4-0 (SUTURE) ×3
SUT MNCRL 4-0 27XMFL (SUTURE) ×1
SUT PDS AB 0 CT1 27 (SUTURE) ×5 IMPLANT
SUT PERFECTPASSER WHITE CART (SUTURE) ×8 IMPLANT
SUT SMART STITCH CARTRIDGE (SUTURE) ×6 IMPLANT
SUT VIC AB 0 CT1 36 (SUTURE) ×5 IMPLANT
SUT VIC AB 2-0 CT2 27 (SUTURE) ×3 IMPLANT
SUTURE ETHLN 4-0 FS2 18XMF BLK (SUTURE) ×1 IMPLANT
SUTURE MAGNUM WIRE 2X48 BLK (SUTURE) ×2 IMPLANT
SUTURE MNCRL 4-0 27XMF (SUTURE) ×1 IMPLANT
SYR 10ML LL (SYRINGE) ×3 IMPLANT
TAPE MICROFOAM 4IN (TAPE) ×1 IMPLANT
TUBING ARTHRO INFLOW-ONLY STRL (TUBING) ×3 IMPLANT
TUBING CONNECTING 10 (TUBING) ×2 IMPLANT
TUBING CONNECTING 10' (TUBING) ×1
WAND HAND CNTRL MULTIVAC 90 (MISCELLANEOUS) ×3 IMPLANT
WRAPON POLAR PAD SHDR XLG (MISCELLANEOUS) ×3

## 2019-09-02 NOTE — Anesthesia Procedure Notes (Signed)
Procedure Name: Intubation Date/Time: 09/02/2019 11:32 AM Performed by: Karoline Caldwell, CRNA Pre-anesthesia Checklist: Patient identified, Patient being monitored, Timeout performed, Emergency Drugs available and Suction available Patient Re-evaluated:Patient Re-evaluated prior to induction Oxygen Delivery Method: Circle system utilized Preoxygenation: Pre-oxygenation with 100% oxygen Induction Type: IV induction Ventilation: Mask ventilation without difficulty Laryngoscope Size: 3 and McGraph Grade View: Grade I Tube type: Oral Tube size: 7.0 mm Number of attempts: 1 Airway Equipment and Method: Stylet Placement Confirmation: ETT inserted through vocal cords under direct vision,  positive ETCO2 and breath sounds checked- equal and bilateral Secured at: 21 cm Tube secured with: Tape Dental Injury: Teeth and Oropharynx as per pre-operative assessment

## 2019-09-02 NOTE — Op Note (Signed)
09/02/2019  4:13 PM  PATIENT:  Mariah Reeves  54 y.o. female  PRE-OPERATIVE DIAGNOSIS: High-grade partial-thickness tear of the left rotator cuff  POST-OPERATIVE DIAGNOSIS: Full-thickness rotator cuff tear of the left shoulder with subacromial impingement and acromioclavicular joint arthrosis.  PROCEDURE:  Procedure(s): LEFT SHOULDER ARTHROSCOPY WITH SUBACROMIAL DECOMPRESSION, DISTAL CLAVICLE EXCISION, BICEPS TENOTOMY AND MINI OPEN ROTATOR CUFF REPAIR   SURGEON:  Surgeon(s) and Role:    * Thornton Park, MD - Primary  ANESTHESIA:   general and paracervical block   PREOPERATIVE INDICATIONS:  Mariah Reeves is a  54 y.o. female with a diagnosis of M75.120 COMPLETE ROTATOR CUFF TEAR, LEFT SHOULDER who failed conservative treatment and elected for surgical management.    The risks benefits and alternatives were discussed with the patient preoperatively including but not limited to the risks of infection, bleeding, nerve injury, persistent pain or weakness, failure of the hardware, re-tear of the rotator cuff and the need for further surgery. Medical risks include DVT and pulmonary embolism, myocardial infarction, stroke, pneumonia, respiratory failure and death. Patient understood these risks and wished to proceed.  OPERATIVE IMPLANTS: ArthroCare Multifix anchor 1 & Smith and Nephew Q Fix anchors x 1  OPERATIVE FINDINGS: Full-thickness tear of the supraspinatus without significant retraction, high-grade partial thickness tear of the intra-articular biceps tendon, subacromial impingement and acromioclavicular joint arthrosis  OPERATIVE PROCEDURE: The patient was met in the preoperative area. The left shoulder was signed with the word yes and my initials according the hospital's correct site of surgery protocol.   A preop history and physical was performed at the bedside.   Patient underwent an interscalene block by the anesthesia service with Exparel.  Patient was brought to the operating room  where she underwent general anesthesia.  The patient was placed in a beachchair position.  A spider arm positioner was used for this case. Examination under anesthesia revealed no limitation of motion or instability with load shift testing. The patient had a negative sulcus sign.  Patient was prepped and draped in a sterile fashion. A timeout was performed to verify the patient's name, date of birth, medical record number, correct site of surgery and correct procedure to be performed there was also used to verify the patient received antibiotics that all appropriate instruments, implants and radiographs studies were available in the room. Once all in attendance were in agreement case began.  Bony landmarks were drawn out with a surgical marker along with proposed arthroscopy incisions.  An 11 blade was used to establish a posterior portal through which the arthroscope was placed in the glenohumeral joint. A full diagnostic examination of the shoulder was performed.  The biceps tendon was found to have significant partial-thickness tear involving the majority of the intra-articular portion of the tendon.  Given the extensive tear and fraying, the decision was made to perform a biceps tenotomy.  An arthroscopic scissor was used through the anterior portal to perform this tenotomy.  There is no labral tear seen.  There are no focal chondral lesions of the glenohumeral joint.  The subscapularis was intact.  The arthroscope was then placed in the subacromial space.  Extensive bursitis was encountered and debrided using a 4-0 resector shaver blade and a 90 ArthroCare wand from a lateral portal which was established under direct visualization using an 18-gauge spinal needle. A subacromial decompression was also performed using a 5.5 mm resector shaver blade from the lateral portal.  The 5.5 mm resector shaver blade was then placed through the  anterior portal and a distal clavicle excision was performed.  Three  Perfect Pass sutures were placed in the lateral border of the rotator cuff tear. All arthroscopic instruments were then removed and the mini-open portion of the procedure began.  A saber-type incision was made along the lateral border of the acromion. The deltoid muscle was identified and split in line with its fibers which allowed visualization of the rotator cuff. The Perfect Pass sutures previously placed in the lateral border of the rotator cuff were brought out through the deltoid split.  A 5.5 mm resector shaver blade was then used to debride the greater tuberosity of all torn fibers of the rotator cuff.  A single [employer] anchor was placed at the articular margin of the humeral head with the greater tuberosity. The suture limbs of the Q Fix anchor were passed medially through the rotator cuff using a First Pass suture passer.   The three Perfect Pass sutures were then anchored to the greater tuberosity footprint using a single Multifix anchor. The sutures were tensioned to allow for anatomic reduction of the rotator cuff to the greater tuberosity. The medial row sutures were then tied down using an arthroscopic knot tying technique.  Arthroscopic images of the repair were taken with the arthroscope both externally and from inside the glenohumeral joint.  All incisions were copiously irrigated. The deltoid fascia was repaired using a 0 Vicryl suture.  The subcutaneous tissue of all incisions were closed with a 2-0 Vicryl. Skin closure for the arthroscopic incisions was performed with 4-0 nylon. The skin edges of the saber incision was approximated with a running 4-0 undyed Monocryl. A dry sterile dressing was applied.  The patient was placed in an abduction sling and a Polar Care was applied to the shoulder.  All sharp and it instrument counts were correct at the conclusion of the case. I was scrubbed and present for the entire case. I spoke with the patient's friend  postoperatively to let  her know the case had been performed without complication and the patient was stable in recovery room.

## 2019-09-02 NOTE — Anesthesia Preprocedure Evaluation (Addendum)
Anesthesia Evaluation  Patient identified by MRN, date of birth, ID band Patient awake    Reviewed: Allergy & Precautions, NPO status , Patient's Chart, lab work & pertinent test results  History of Anesthesia Complications Negative for: history of anesthetic complications  Airway Mallampati: III  TM Distance: >3 FB Neck ROM: Full    Dental no notable dental hx.    Pulmonary sleep apnea and Continuous Positive Airway Pressure Ventilation , neg COPD,    breath sounds clear to auscultation- rhonchi (-) wheezing      Cardiovascular Exercise Tolerance: Good (-) hypertension(-) CAD, (-) Past MI, (-) Cardiac Stents and (-) CABG  Rhythm:Regular Rate:Normal - Systolic murmurs and - Diastolic murmurs    Neuro/Psych  Headaches, neg Seizures PSYCHIATRIC DISORDERS Anxiety Depression Bipolar Disorder    GI/Hepatic Neg liver ROS, GERD  ,  Endo/Other  negative endocrine ROSneg diabetes  Renal/GU Renal disease: hx of nephrolithiasis.     Musculoskeletal  (+) Arthritis ,   Abdominal (+) + obese,   Peds  Hematology negative hematology ROS (+)   Anesthesia Other Findings Past Medical History: No date: Allergy No date: Anomaly, uterus     Comment:  tumor of uterus No date: Anxiety No date: Bilateral occipital neuralgia No date: Bipolar 1 disorder (HCC) No date: Chronic back pain No date: Complication of anesthesia     Comment:  hard time waking up after gastric bypass.Pt stated that               she has a small mouth and has had several surgeries since              gastric bypass with no problems. No date: Degenerative disc disease, lumbar No date: Depression No date: Facet syndrome, lumbar No date: Family history of breast cancer     Comment:  2/21 cancer genetic testing letter sent No date: GERD (gastroesophageal reflux disease)     Comment:  before gastric bypass No date: Headache     Comment:  Migraines No date:  History of kidney stones     Comment:  Left No date: Hx of vertigo No date: Patella fracture     Comment:  Left No date: PTSD (post-traumatic stress disorder) No date: Sleep apnea     Comment:  C-PAP No date: Wears glasses   Reproductive/Obstetrics                             Anesthesia Physical Anesthesia Plan  ASA: III  Anesthesia Plan: General   Post-op Pain Management:  Regional for Post-op pain   Induction: Intravenous  PONV Risk Score and Plan: 2 and Ondansetron, Dexamethasone and Midazolam  Airway Management Planned: Oral ETT  Additional Equipment:   Intra-op Plan:   Post-operative Plan: Extubation in OR  Informed Consent: I have reviewed the patients History and Physical, chart, labs and discussed the procedure including the risks, benefits and alternatives for the proposed anesthesia with the patient or authorized representative who has indicated his/her understanding and acceptance.     Dental advisory given  Plan Discussed with: CRNA and Anesthesiologist  Anesthesia Plan Comments: (Discussed importance of using CPAP while nerve block is working.)        Anesthesia Quick Evaluation

## 2019-09-02 NOTE — Anesthesia Procedure Notes (Signed)
Anesthesia Regional Block: Interscalene brachial plexus block   Pre-Anesthetic Checklist: ,, timeout performed, Correct Patient, Correct Site, Correct Laterality, Correct Procedure, Correct Position, site marked, Risks and benefits discussed,  Surgical consent,  Pre-op evaluation,  At surgeon's request and post-op pain management  Laterality: Left  Prep: chloraprep       Needles:  Injection technique: Single-shot  Needle Type: Stimiplex     Needle Length: 10cm  Needle Gauge: 21     Additional Needles:   Procedures:,,,, ultrasound used (permanent image in chart),,,,  Narrative:  Start time: 09/02/2019 10:04 AM End time: 09/02/2019 10:08 AM Injection made incrementally with aspirations every 5 mL.  Performed by: Personally  Anesthesiologist: Alver Fisher, MD  Additional Notes: Functioning IV was confirmed and monitors were applied.  A Stimuplex needle was used. Sterile prep and drape,hand hygiene and sterile gloves were used.  Negative aspiration and negative test dose prior to incremental administration of local anesthetic. The patient tolerated the procedure well.

## 2019-09-02 NOTE — Transfer of Care (Signed)
Immediate Anesthesia Transfer of Care Note  Patient: Mariah Reeves  Procedure(s) Performed: LEFT SHOULDER ARTHROSCOPY WITH SUBACROMIAL DECOMPRESSION AND DISTAL CLAVICLE EXCISION AND MINI OPEN ROTATOR CUFF REPAIR (Left )  Patient Location: PACU  Anesthesia Type:General  Level of Consciousness: sedated  Airway & Oxygen Therapy: Patient Spontanous Breathing and Patient connected to face mask oxygen  Post-op Assessment: Report given to RN and Post -op Vital signs reviewed and stable  Post vital signs: Reviewed and stable  Last Vitals:  Vitals Value Taken Time  BP 147/70 09/02/19 1337  Temp 36.1 C 09/02/19 1337  Pulse 80 09/02/19 1337  Resp    SpO2 100 % 09/02/19 1337    Last Pain:  Vitals:   09/02/19 0858  TempSrc: Tympanic  PainSc: 8          Complications: No apparent anesthesia complications

## 2019-09-02 NOTE — H&P (Signed)
PREOPERATIVE H&P  Chief Complaint: HIGH GRADE PARTIAL THICKNESS ROTATOR CUFF TEAR,  LEFT SHOULDER  HPI: Mariah Reeves is a 54 y.o. female who presents for preoperative history and physical with a diagnosis of high-grade partial-thickness tear involving the supraspinatus and left shoulder.  This was confirmed by MRI.  Patient has failed nonoperative management.. Symptoms of pain, weakness and limited range of motion of left shoulder are significantly impairing activities of daily living.  She wished to proceed with surgical fixation of her rotator cuff tear.   Past Medical History:  Diagnosis Date  . Allergy   . Anomaly, uterus    tumor of uterus  . Anxiety   . Bilateral occipital neuralgia   . Bipolar 1 disorder (HCC)   . Chronic back pain   . Complication of anesthesia    hard time waking up after gastric bypass.Pt stated that she has a small mouth and has had several surgeries since gastric bypass with no problems.  . Degenerative disc disease, lumbar   . Depression   . Facet syndrome, lumbar   . Family history of breast cancer    2/21 cancer genetic testing letter sent  . GERD (gastroesophageal reflux disease)    before gastric bypass  . Headache    Migraines  . History of kidney stones    Left  . Hx of vertigo   . Patella fracture    Left  . PTSD (post-traumatic stress disorder)   . Sleep apnea    C-PAP  . Wears glasses    Past Surgical History:  Procedure Laterality Date  . ABDOMINOPLASTY  2006  . CARPAL TUNNEL RELEASE Left   . CHOLECYSTECTOMY    . COLONOSCOPY W/ POLYPECTOMY    . CYSTOSCOPY W/ URETERAL STENT PLACEMENT  01/03/2017   Procedure: CYSTOSCOPY WITH RETROGRADE PYELOGRAM/URETERAL STENT PLACEMENT;  Surgeon: Hildred Laser, MD;  Location: [facility];  Service: Urology;;  . Bluford Kaufmann W/ URETERAL STENT PLACEMENT Left 01/17/2017   Procedure: CYSTOSCOPY WITH STENT REPLACEMENT;  Surgeon: Hildred Laser, MD;  Location: [facility];  Service: Urology;  Laterality:  Left;  . DILATATION & CURETTAGE/HYSTEROSCOPY WITH MYOSURE N/A 07/08/2015   Procedure: DILATATION & CURETTAGE/HYSTEROSCOPY WITH MYOSURE;  Surgeon: Nadara Mustard, MD;  Location: [facility];  Service: Gynecology;  Laterality: N/A;  . DILATION AND CURETTAGE OF UTERUS     1/17  . EXTRACORPOREAL SHOCK WAVE LITHOTRIPSY Left 12/07/2016   Procedure: EXTRACORPOREAL SHOCK WAVE LITHOTRIPSY (ESWL);  Surgeon: Hildred Laser, MD;  Location: [facility];  Service: Urology;  Laterality: Left;  Marland Kitchen GASTRIC BYPASS  2005  . KNEE SURGERY Right    ACL- repair  . LITHOTRIPSY    . MIDDLE EAR SURGERY    . NASAL SEPTUM SURGERY    . OVARY SURGERY Right   . URETEROSCOPY WITH HOLMIUM LASER LITHOTRIPSY     perforated ureter. had nephrostomy tube for brief time  . URETEROSCOPY WITH HOLMIUM LASER LITHOTRIPSY Left 01/17/2017   Procedure: URETEROSCOPY WITH HOLMIUM LASER LITHOTRIPSY;  Surgeon: Hildred Laser, MD;  Location: [facility];  Service: Urology;  Laterality: Left;   Social History   Socioeconomic History  . Marital status: Divorced    Spouse name: Not on file  . Number of children: Not on file  . Years of education: Not on file  . Highest education level: Not on file  Occupational History  . Not on file  Tobacco Use  . Smoking status: Never Smoker  . Smokeless tobacco: Never Used  Substance  and Sexual Activity  . Alcohol use: No  . Drug use: No  . Sexual activity: Never    Birth control/protection: None  Other Topics Concern  . Not on file  Social History Narrative  . Not on file   Social Determinants of Health   Financial Resource Strain:   . Difficulty of Paying Living Expenses:   Food Insecurity:   . Worried About Programme researcher, broadcasting/film/video in the Last Year:   . Barista in the Last Year:   Transportation Needs:   . Freight forwarder (Medical):   Marland Kitchen Lack of Transportation (Non-Medical):   Physical Activity:   . Days of Exercise per Week:   . Minutes of Exercise per Session:    Stress:   . Feeling of Stress :   Social Connections:   . Frequency of Communication with Friends and Family:   . Frequency of Social Gatherings with Friends and Family:   . Attends Religious Services:   . Active Member of Clubs or Organizations:   . Attends Banker Meetings:   Marland Kitchen Marital Status:    Family History  Problem Relation Age of Onset  . Hypertension Mother   . Diabetes Mother   . Heart disease Father   . Alcohol abuse Father   . Breast cancer Maternal Grandmother   . Breast cancer Other   . Breast cancer Other   . Kidney cancer Neg Hx   . Bladder Cancer Neg Hx    Allergies  Allergen Reactions  . Contrast Media [Iodinated Diagnostic Agents] Hives  . Carbetapentane Hives  . Tape Hives    Adhesive Tape  . Trazodone And Nefazodone     Sleep walking   Prior to Admission medications   Medication Sig Start Date End Date Taking? Authorizing Provider  conjugated estrogens (PREMARIN) vaginal cream 1 gram vaginally nightly at bedtime for 2 weeks, 1 gram vaginally every other night at bedtime for 2 weeks, then 1 gram vaginally twice weekly Patient taking differently: Place 1 Applicatorful vaginally every other day.  07/23/19  Yes Vena Austria, MD  diphenhydramine-acetaminophen (TYLENOL PM) 25-500 MG TABS tablet Take 2 tablets by mouth at bedtime as needed (Sleep).   Yes [provider]  escitalopram (LEXAPRO) 20 MG tablet Take 20 mg by mouth at bedtime.   Yes [provider]  gabapentin (NEURONTIN) 300 MG capsule Take 600 mg by mouth at bedtime.   Yes [provider]  HYDROcodone-acetaminophen (NORCO) 10-325 MG tablet Take 1 tablet by mouth every 4 (four) hours as needed for moderate pain.    Yes [provider]  lamoTRIgine (LAMICTAL) 200 MG tablet Take 400 mg by mouth at bedtime.    Yes [provider]  methocarbamol (ROBAXIN) 500 MG tablet Take 250-500 mg by mouth daily as needed for muscle spasms.    Yes  [provider]  ondansetron (ZOFRAN-ODT) 8 MG disintegrating tablet Take 8 mg by mouth every 8 (eight) hours as needed for nausea or vomiting.   Yes [provider]  Sodium Fluoride (CLINPRO 5000) 1.1 % PSTE Place 1 application onto teeth daily.    Yes [provider]  VOLTAREN 1 % GEL Apply 1 application topically daily as needed (Pain).  01/06/19  Yes [provider]     Positive ROS: All other systems have been reviewed and were otherwise negative with the exception of those mentioned in the HPI and as above.  Physical Exam: General: Alert, no acute distress  Cardiovascular: Regular rate and rhythm, no murmurs rubs or gallops.  No pedal edema Respiratory: Clear to auscultation bilaterally, no wheezes rales or rhonchi. No cyanosis, no use of accessory musculature GI: No organomegaly, abdomen is soft and non-tender nondistended with positive bowel sounds. Skin: Skin intact, no lesions within the operative field. Neurologic: Sensation intact distally Psychiatric: Patient is competent for consent with normal mood and affect Lymphatic: No cervical lymphadenopathy  MUSCULOSKELETAL: Left shoulder: Patient's skin is intact.  There is no erythema ecchymosis swelling or deformity.  There is no muscle atrophy.  Patient has pain with forward elevation and abduction at 90 degrees.  She has increased pain with a downward directed force on her abducted shoulder.  Patient has positive impingement signs but no apprehension or instability.  She has full digital wrist and will range of motion, intact sensation light touch and a palpable radial pulse.  Assessment: Left shoulder high-grade partial-thickness tear of the supraspinatus  Plan: Plan for Procedure(s): LEFT SHOULDER ARTHROSCOPIC SUBACROMIAL DECOMPRESSION, DISTAL CLAVICLE EXCISION AND MINI OPEN ROTATOR CUFF REPAIR  I reviewed the details the operation as well as the postoperative course with the patient.  I  discussed the risks and benefits of surgery. The risks include but are not limited to infection, bleeding, nerve or blood vessel injury, joint stiffness or loss of motion, persistent pain, weakness or instability, retear of the rotator cuff and hardware failure and the need for further surgery. Medical risks include but are not limited to DVT and pulmonary embolism, myocardial infarction, stroke, pneumonia, respiratory failure and death. Patient understood these risks and wished to proceed.     Thornton Park, MD   09/02/2019 10:49 AM

## 2019-09-02 NOTE — Discharge Instructions (Addendum)
Interscalene Nerve Block, Care After This sheet gives you information about how to care for yourself after your procedure. Your health care provider may also give you more specific instructions. If you have problems or questions, contact your health care provider. What can I expect after the procedure? After the procedure, it is common to have:  Soreness or tenderness in your neck.  Numbness in your shoulder, upper arm, and some fingers.  Weakness in your shoulder and arm muscles. The feeling and strength in your shoulder, arm, and fingers should return to normal within hours after your procedure. Follow these instructions at home: For at least 24 hours after the procedure:  Do not: ? Participate in activities in which you could fall or become injured. ? Drive. ? Use heavy machinery. ? Drink alcohol. ? Take sleeping pills or medicines that cause drowsiness. ? Make important decisions or sign legal documents. ? Take care of children on your own.  Rest. Eating and drinking  If you vomit, drink water, juice, or soup when you can drink without vomiting.  Make sure you have little or no nausea before eating solid foods.  Follow the diet that is recommended by your health care provider. If you have a sling:  Wear it as told by your health care provider. Remove it only as told by your health care provider.  Loosen the sling if your fingers tingle, become numb, or turn cold and blue.  Make sure that your entire arm, including your wrist, is supported. Do not allow your wrist to dangle over the end of the sling.  Do not let your sling get wet if it is not waterproof.  Keep the sling clean. Bathing  Do not take baths, swim, or use a hot tub until your health care provider approves.  If you have a nerve block catheter in place, keep the incision site and tubing dry. Injection site care   Wash your hands with soap and water before you change your bandage (dressing). If soap and  water are not available, use hand sanitizer.  Change your dressing as told by your health care provider.  Keep your dressing dry.  Check your nerve block injection site every day for signs of infection. Check for: ? Redness, swelling, or pain. ? Fluid or blood. ? Warmth. Activity  Do not perform complex or risky activities while taking prescription pain medicine and until you have fully recovered.  Return to your normal activities as told by your health care provider and as you can tolerate them. Ask your health care provider what activities are safe for you.  Rest and take it easy. This will help you heal and recover more quickly and fully.  Be very cautious until you have regained strength and sensation. General instructions  Have a responsible adult stay with you until you are awake and alert.  Do not drive or use heavy machinery while taking prescription pain medicine and until you have fully recovered. Ask your health care provider when it is safe to drive.  Take over-the-counter and prescription medicines only as told by your health care provider.  If you smoke, do not smoke without supervision.  Do not expose your arm or shoulder to very cold or very hot temperatures until you have full feeling back.  If you have a nerve block catheter in place: ? Try to keep the catheter from getting kinked or pinched. ? Avoid pulling or tugging on the catheter.  Keep all follow-up visits as told   by your health care provider. This is important. Contact a health care provider if:  You have chills or fever.  You have redness, swelling, or pain around your injection site.  You have fluid or blood coming from the injection site.  The skin around the injection site is warm to the touch.  There is a bad smell coming from your dressing.  You have hoarseness or a drooping or dry eye that lasts more than a few days.  You have pain that is poorly controlled with the block or with pain  medicine.  You have numbness, tingling, or weakness in your shoulder or arm that lasts for more than one week. Get help right away if:  You have severe pain.  You lose or do not regain strength and sensation in your arm even after the nerve block medicine has stopped.  You have trouble breathing.  You have a nerve block catheter still in place and you begin to shiver.  You have a nerve block catheter still in place and you are getting more and more numb or weak. This information is not intended to replace advice given to you by your health care provider. Make sure you discuss any questions you have with your health care provider. Document Revised: 06/01/2017 Document Reviewed: 01/28/2016 Elsevier Patient Education  2020 Elsevier Inc[employer]   AMBULATORY SURGERY  DISCHARGE INSTRUCTIONS   1) The drugs that you were given will stay in your system until tomorrow so for the next 24 hours you should not:  A) Drive an automobile B) Make any legal decisions C) Drink any alcoholic beverage   2) You may resume regular meals tomorrow.  Today it is better to start with liquids and gradually work up to solid foods.  You may eat anything you prefer, but it is better to start with liquids, then soup and crackers, and gradually work up to solid foods.   3) Please notify your doctor immediately if you have any unusual bleeding, trouble breathing, redness and pain at the surgery site, drainage, fever, or pain not relieved by medication.    4) Additional Instructions:        Please contact your physician with any problems or Same Day Surgery at [phone], Monday through Friday 6 am to 4 pm, or [place] [facility] [place] Main number at [phone]. 

## 2019-09-03 ENCOUNTER — Ambulatory Visit: Payer: BC Managed Care – PPO | Admitting: Obstetrics and Gynecology

## 2019-09-03 NOTE — Anesthesia Postprocedure Evaluation (Signed)
Anesthesia Post Note  Patient: Mariah Reeves  Procedure(s) Performed: LEFT SHOULDER ARTHROSCOPY WITH SUBACROMIAL DECOMPRESSION AND DISTAL CLAVICLE EXCISION AND MINI OPEN ROTATOR CUFF REPAIR (Left )  Patient location during evaluation: PACU Anesthesia Type: General Level of consciousness: awake and alert and oriented Pain management: pain level controlled Vital Signs Assessment: post-procedure vital signs reviewed and stable Respiratory status: spontaneous breathing Cardiovascular status: blood pressure returned to baseline Anesthetic complications: no     Last Vitals:  Vitals:   09/02/19 1438 09/02/19 1458  BP: (!) 158/84 126/77  Pulse: 83 85  Resp: 16 16  Temp: 36.6 C   SpO2: 97% 98%    Last Pain:  Vitals:   09/02/19 1458  TempSrc:   PainSc: 0-No pain                 Quamesha Mullet

## 2019-09-08 ENCOUNTER — Other Ambulatory Visit: Payer: Self-pay | Admitting: Obstetrics and Gynecology

## 2019-09-08 ENCOUNTER — Telehealth: Payer: Self-pay

## 2019-09-08 ENCOUNTER — Other Ambulatory Visit: Payer: Self-pay

## 2019-09-08 MED ORDER — PREMARIN 0.625 MG/GM VA CREA: 1.0000 g | TOPICAL_CREAM | VAGINAL | 3 refills | Status: DC

## 2019-09-08 NOTE — Telephone Encounter (Signed)
Advise

## 2019-09-08 NOTE — Telephone Encounter (Signed)
Refill has been sent.  °

## 2019-09-08 NOTE — Telephone Encounter (Signed)
Pt calling; AMS dx'd her c low estrogen; rx'd premarin cream; has used it and is out; can't come in d/t shoulder surgery.  Can it be refilled or wait 15m-6wks to see him.  [phone]

## 2019-09-15 ENCOUNTER — Encounter: Payer: Self-pay | Admitting: Physical Therapy

## 2019-09-15 ENCOUNTER — Other Ambulatory Visit: Payer: Self-pay

## 2019-09-15 ENCOUNTER — Ambulatory Visit: Payer: Medicare Other | Attending: Orthopedic Surgery | Admitting: Physical Therapy

## 2019-09-15 DIAGNOSIS — M25512 Pain in left shoulder: Secondary | ICD-10-CM | POA: Diagnosis present

## 2019-09-15 DIAGNOSIS — M6281 Muscle weakness (generalized): Secondary | ICD-10-CM | POA: Insufficient documentation

## 2019-09-15 DIAGNOSIS — M25619 Stiffness of unspecified shoulder, not elsewhere classified: Secondary | ICD-10-CM | POA: Diagnosis present

## 2019-09-15 DIAGNOSIS — Z9889 Other specified postprocedural states: Secondary | ICD-10-CM | POA: Diagnosis present

## 2019-09-15 DIAGNOSIS — R29898 Other symptoms and signs involving the musculoskeletal system: Secondary | ICD-10-CM | POA: Diagnosis present

## 2019-09-15 NOTE — Therapy (Signed)
[facility] [address] [place], [place], [address] Phone: [phone]   Fax:  [phone]  Physical Therapy Evaluation  Patient Details  Name: Mariah Reeves MRN: [id number] Date of Birth: 02/02/1966 Referring Provider (PT): Dr. Martha Clan   Encounter Date: 09/15/2019  PT End of Session - 09/17/19 0733    Visit Number  1    Number of Visits  9    Date for PT Re-Evaluation  10/13/19    Authorization - Visit Number  1    Authorization - Number of Visits  10    PT Start Time  0904    PT Stop Time  1001    PT Time Calculation (min)  57 min    Activity Tolerance  Patient tolerated treatment well;Patient limited by pain    Behavior During Therapy  [facility] for tasks assessed/performed       Past Medical History:  Diagnosis Date  . Allergy   . Anomaly, uterus    tumor of uterus  . Anxiety   . Bilateral occipital neuralgia   . Bipolar 1 disorder (HCC)   . Chronic back pain   . Complication of anesthesia    hard time waking up after gastric bypass.Pt stated that she has a small mouth and has had several surgeries since gastric bypass with no problems.  . Degenerative disc disease, lumbar   . Depression   . Facet syndrome, lumbar   . Family history of breast cancer    2/21 cancer genetic testing letter sent  . GERD (gastroesophageal reflux disease)    before gastric bypass  . Headache    Migraines  . History of kidney stones    Left  . Hx of vertigo   . Patella fracture    Left  . PTSD (post-traumatic stress disorder)   . Sleep apnea    C-PAP  . Wears glasses     Past Surgical History:  Procedure Laterality Date  . ABDOMINOPLASTY  2006  . CARPAL TUNNEL RELEASE Left   . CHOLECYSTECTOMY    . COLONOSCOPY W/ POLYPECTOMY    . CYSTOSCOPY W/ URETERAL STENT PLACEMENT  01/03/2017   Procedure: CYSTOSCOPY WITH RETROGRADE PYELOGRAM/URETERAL STENT PLACEMENT;  Surgeon: Hildred Laser, MD;  Location: [facility];  Service: Urology;;   . Bluford Kaufmann W/ URETERAL STENT PLACEMENT Left 01/17/2017   Procedure: CYSTOSCOPY WITH STENT REPLACEMENT;  Surgeon: Hildred Laser, MD;  Location: [facility];  Service: Urology;  Laterality: Left;  . DILATATION & CURETTAGE/HYSTEROSCOPY WITH MYOSURE N/A 07/08/2015   Procedure: DILATATION & CURETTAGE/HYSTEROSCOPY WITH MYOSURE;  Surgeon: Nadara Mustard, MD;  Location: [facility];  Service: Gynecology;  Laterality: N/A;  . DILATION AND CURETTAGE OF UTERUS     1/17  . EXTRACORPOREAL SHOCK WAVE LITHOTRIPSY Left 12/07/2016   Procedure: EXTRACORPOREAL SHOCK WAVE LITHOTRIPSY (ESWL);  Surgeon: Hildred Laser, MD;  Location: [facility];  Service: Urology;  Laterality: Left;  Marland Kitchen GASTRIC BYPASS  2005  . KNEE SURGERY Right    ACL- repair  . LITHOTRIPSY    . MIDDLE EAR SURGERY    . NASAL SEPTUM SURGERY    . OVARY SURGERY Right   . URETEROSCOPY WITH HOLMIUM LASER LITHOTRIPSY     perforated ureter. had nephrostomy tube for brief time  . URETEROSCOPY WITH HOLMIUM LASER LITHOTRIPSY Left 01/17/2017   Procedure: URETEROSCOPY WITH HOLMIUM LASER LITHOTRIPSY;  Surgeon: Hildred Laser, MD;  Location: [facility];  Service: Urology;  Laterality: Left;  There were no vitals filed for this visit.   Subjective Assessment - 09/17/19 0723    Subjective  Pt. s/p L RTC repair on 3/23.  Pt. has been compliant with sling use and incisions are healing well.  Minimal c/o L shoulder pain currently at rest.  MD f/u: 4/23.    Pertinent History  Pt. known well to PT.  See previous PT treatment notes.    Patient Stated Goals  Increase L shoulder ROM/ pain mgmt.  Return to driving.    Currently in Pain?  Yes    Pain Score  5     Pain Location  Shoulder    Pain Orientation  Left    Pain Descriptors / Indicators  Aching    Pain Type  Surgical pain       3/23: surgery date.  MD f/u: 4/23 Incision healing (steristrips in place).   R shoulder AROM WNL  Grip: R 26.1#, L 16.4#. L shoulder PROM: flexion (106 deg.),  abduction (105 deg.), ER (28 deg.), IR (58 deg.).   Good L elbow flexion/ extension/ sup./ pron.  No MMT to L UE due to PROM only. R UE strength grossly 4+/5 MMT except biceps/triceps 5/5 MMT.     Supine L shoulder PROM (as tolerated)- pain tolerable range.     Objective measurements completed on examination: See above findings.       See HEP (issued pulleys)    PT Education - 09/17/19 0732    Education Details  See HEP, reviewed sling use/ PROM/ benefits of ice    Person(s) Educated  Patient    Methods  Explanation;Demonstration    Comprehension  Verbalized understanding;Returned demonstration;Verbal cues required;Tactile cues required          PT Long Term Goals - 09/17/19 0743      PT LONG TERM GOAL #1   Title  Pt will score at least 64 on FOTO to demonstrate increased functional mobility.    Baseline  Initial FOTO: 40    Time  4    Period  Weeks    Status  New    Target Date  10/13/19      PT LONG TERM GOAL #2   Title  Pt will decrease worst pain as reported on NPRS by at least 3 points in order to demonstrate clinically significant reduction in pain.    Baseline  Worst pain >5/10 L shoulder    Time  4    Period  Weeks    Status  New    Target Date  10/13/19      PT LONG TERM GOAL #3   Title  Pt. independent with HEP to increase L shoulder AROM to WNL (all planes) as compared to R shoulder to improve functional mobility.    Baseline  L shoulder PROM: flexion (106 deg.), abduction (105 deg.), ER (28 deg.), IR (58 deg.).    Time  4    Period  Weeks    Status  New    Target Date  10/13/19      PT LONG TERM GOAL #4   Title  Pt. able to lift a gallon of milk/ grocery bags with no L shoulder pain/ limitations.    Baseline  No lifting with L shoulder at this time.    Time  4    Period  Weeks    Status  New    Target Date  10/13/19          Plan -  09/17/19 0734    Clinical Impression Statement  Pt. is a pleasant 54 y/o female s/p L RTC repair on  09/02/2019.  Pt. reports good compliance with use of sling and ice.  Pt. reports 5/10 L shoulder pain at rest/ gentle PROM.  R shoulder AROM WNL (all planes of movement).  L shoulder PROM: flexion (106 deg.), abduction (105 deg.), ER (28 deg.), IR (58 deg.).  Good L elbow flexion/ extension/ sup./ pron. (no pain).  R UE strength grossly 4+/5 MMT except biceps/triceps 5/5 MMT.  Grip strength: R 26.1#, L 16.4#.  Pt. understands MD protocol for L shoulder ROM/ strengthening over next weeks/ months.  Pt. will benefit from skilled PT services to increase L shoulder ROM/ strengthening to improve pain-free mobility/ return to driving.    Examination-Activity Limitations  Bed Mobility;Bathing;Reach Overhead;Carry;Dressing;Sleep;Lift    Stability/Clinical Decision Making  Evolving/Moderate complexity    Clinical Decision Making  Moderate    Rehab Potential  Good    PT Frequency  2x / week    PT Duration  4 weeks    PT Treatment/Interventions  ADLs/Self Care Home Management;Cryotherapy;Electrical Stimulation;Moist Heat;Therapeutic exercise;Therapeutic activities;Functional mobility training;Neuromuscular re-education;Patient/family education;Manual techniques;Passive range of motion;Scar mobilization    PT Next Visit Plan  Increase L shoulder PROM (follow MD protocol).    PT Home Exercise Plan  [email or web address]    Consulted and Agree with Plan of Care  Patient       Patient will benefit from skilled therapeutic intervention in order to improve the following deficits and impairments:  Improper body mechanics, Pain, Decreased coordination, Decreased mobility, Decreased activity tolerance, Decreased endurance, Decreased range of motion, Decreased strength, Impaired UE functional use, Impaired perceived functional ability, Hypomobility, Impaired flexibility  Visit Diagnosis: S/P complete repair of rotator cuff  Acute pain of left shoulder  Limited range of motion of shoulder  Weakness of shoulder     Problem  List Patient Active Problem List   Diagnosis Date Noted  . Nephrolithiasis 01/26/2017  . Left ureteral stone 01/03/2017  . Endometrial disorder 07/08/2015  . Bilateral occipital neuralgia 03/02/2015  . DDD (degenerative disc disease), lumbar 12/24/2014  . Facet syndrome, lumbar 12/24/2014  . Sacroiliac joint dysfunction 12/24/2014   Pura Spice, PT, DPT # [phone] 09/17/2019, 7:50 AM  [facility] [address] [place], [place], [address] Phone: [phone]   Fax:  [phone]  Name: Mariah Reeves MRN: [id number] Date of Birth: [birth year]

## 2019-09-15 NOTE — Patient Instructions (Signed)
Access Code: [email or web address]: 04/05/2021Prepared by: Casimiro Needle SherkExercises  Circular Shoulder Pendulum with Table Support - 2 x daily - 7 x weekly - 1 sets - 10 reps  Seated Elbow Flexion Extension AROM - 2 x daily - 7 x weekly - 1 sets - 10 reps  Seated Forearm Pronation and Supination AROM - 2 x daily - 7 x weekly - 1 sets - 10 reps  Seated Shoulder Flexion AAROM with Pulley Behind - 2 x daily - 7 x weekly - 1 sets - 20 reps  Seated Shoulder Abduction AAROM with Pulley Behind - 2 x daily - 7 x weekly - 1 sets - 20 reps

## 2019-09-16 ENCOUNTER — Ambulatory Visit: Payer: BC Managed Care – PPO | Admitting: Physical Therapy

## 2019-09-17 ENCOUNTER — Encounter: Payer: Self-pay | Admitting: Physical Therapy

## 2019-09-17 ENCOUNTER — Other Ambulatory Visit: Payer: Self-pay

## 2019-09-17 ENCOUNTER — Ambulatory Visit: Payer: Medicare Other | Admitting: Physical Therapy

## 2019-09-17 DIAGNOSIS — M6281 Muscle weakness (generalized): Secondary | ICD-10-CM

## 2019-09-17 DIAGNOSIS — R29898 Other symptoms and signs involving the musculoskeletal system: Secondary | ICD-10-CM

## 2019-09-17 DIAGNOSIS — M25619 Stiffness of unspecified shoulder, not elsewhere classified: Secondary | ICD-10-CM

## 2019-09-17 DIAGNOSIS — M25512 Pain in left shoulder: Secondary | ICD-10-CM

## 2019-09-17 DIAGNOSIS — Z9889 Other specified postprocedural states: Secondary | ICD-10-CM | POA: Diagnosis not present

## 2019-09-17 NOTE — Therapy (Signed)
[facility] [address] [place], [place], [address] Phone: [phone]   Fax:  [phone]  Physical Therapy Treatment  Patient Details  Name: Mariah Reeves MRN: [id number] Date of Birth: Jul 30, 1965 Referring Provider (PT): Dr. Martha Clan   Encounter Date: 09/17/2019  PT End of Session - 09/17/19 0904    Visit Number  2    Number of Visits  9    Date for PT Re-Evaluation  10/13/19    Authorization - Visit Number  2    Authorization - Number of Visits  10    PT Start Time  0904    PT Stop Time  0950    PT Time Calculation (min)  46 min    Activity Tolerance  Patient tolerated treatment well;Patient limited by pain    Behavior During Therapy  [facility] for tasks assessed/performed       Past Medical History:  Diagnosis Date  . Allergy   . Anomaly, uterus    tumor of uterus  . Anxiety   . Bilateral occipital neuralgia   . Bipolar 1 disorder (HCC)   . Chronic back pain   . Complication of anesthesia    hard time waking up after gastric bypass.Pt stated that she has a small mouth and has had several surgeries since gastric bypass with no problems.  . Degenerative disc disease, lumbar   . Depression   . Facet syndrome, lumbar   . Family history of breast cancer    2/21 cancer genetic testing letter sent  . GERD (gastroesophageal reflux disease)    before gastric bypass  . Headache    Migraines  . History of kidney stones    Left  . Hx of vertigo   . Patella fracture    Left  . PTSD (post-traumatic stress disorder)   . Sleep apnea    C-PAP  . Wears glasses     Past Surgical History:  Procedure Laterality Date  . ABDOMINOPLASTY  2006  . CARPAL TUNNEL RELEASE Left   . CHOLECYSTECTOMY    . COLONOSCOPY W/ POLYPECTOMY    . CYSTOSCOPY W/ URETERAL STENT PLACEMENT  01/03/2017   Procedure: CYSTOSCOPY WITH RETROGRADE PYELOGRAM/URETERAL STENT PLACEMENT;  Surgeon: Hildred Laser, MD;  Location: [facility];  Service: Urology;;   . Bluford Kaufmann W/ URETERAL STENT PLACEMENT Left 01/17/2017   Procedure: CYSTOSCOPY WITH STENT REPLACEMENT;  Surgeon: Hildred Laser, MD;  Location: [facility];  Service: Urology;  Laterality: Left;  . DILATATION & CURETTAGE/HYSTEROSCOPY WITH MYOSURE N/A 07/08/2015   Procedure: DILATATION & CURETTAGE/HYSTEROSCOPY WITH MYOSURE;  Surgeon: Nadara Mustard, MD;  Location: [facility];  Service: Gynecology;  Laterality: N/A;  . DILATION AND CURETTAGE OF UTERUS     1/17  . EXTRACORPOREAL SHOCK WAVE LITHOTRIPSY Left 12/07/2016   Procedure: EXTRACORPOREAL SHOCK WAVE LITHOTRIPSY (ESWL);  Surgeon: Hildred Laser, MD;  Location: [facility];  Service: Urology;  Laterality: Left;  Marland Kitchen GASTRIC BYPASS  2005  . KNEE SURGERY Right    ACL- repair  . LITHOTRIPSY    . MIDDLE EAR SURGERY    . NASAL SEPTUM SURGERY    . OVARY SURGERY Right   . URETEROSCOPY WITH HOLMIUM LASER LITHOTRIPSY     perforated ureter. had nephrostomy tube for brief time  . URETEROSCOPY WITH HOLMIUM LASER LITHOTRIPSY Left 01/17/2017   Procedure: URETEROSCOPY WITH HOLMIUM LASER LITHOTRIPSY;  Surgeon: Hildred Laser, MD;  Location: [facility];  Service: Urology;  Laterality: Left;  There were no vitals filed for this visit.  Subjective Assessment - 09/17/19 0903    Subjective  Pt. reports 8/10 L shoulder pain at this time.  Pt. states she didn't sleep well last night and had some numbness in thumb (radial nerve) with sling use in bed.  Pt. reports numbness improved once she got out of bed.    Pertinent History  Pt. known well to PT.  See previous PT treatment notes.    Patient Stated Goals  Increase L shoulder ROM/ pain mgmt.  Return to driving.    Currently in Pain?  Yes    Pain Score  8     Pain Location  Shoulder    Pain Orientation  Left        There.ex.:  Reviewed HEP Seated shoulder pulley ex. (flexion/ abduction)- 20x each.   Pt. Requires instruction in proper technique and able to demonstrated increase ROM (no increase c/o  pain).   Supine L shoulder flexion/ extension/ pron./ sup. 10x2  Manual tx.:  Supine L shoulder STM to proximal biceps/ deltoid/ UT musculature 7 min. Supine L shoulder PROM (all planes per MD protocol)- 10x2 each with static holds  Pt. Able to don/ doff sling independently    PT Long Term Goals - 09/17/19 0743      PT LONG TERM GOAL #1   Title  Pt will score at least 64 on FOTO to demonstrate increased functional mobility.    Baseline  Initial FOTO: 40    Time  4    Period  Weeks    Status  New    Target Date  10/13/19      PT LONG TERM GOAL #2   Title  Pt will decrease worst pain as reported on NPRS by at least 3 points in order to demonstrate clinically significant reduction in pain.    Baseline  Worst pain >5/10 L shoulder    Time  4    Period  Weeks    Status  New    Target Date  10/13/19      PT LONG TERM GOAL #3   Title  Pt. independent with HEP to increase L shoulder AROM to WNL (all planes) as compared to R shoulder to improve functional mobility.    Baseline  L shoulder PROM: flexion (106 deg.), abduction (105 deg.), ER (28 deg.), IR (58 deg.).    Time  4    Period  Weeks    Status  New    Target Date  10/13/19      PT LONG TERM GOAL #4   Title  Pt. able to lift a gallon of milk/ grocery bags with no L shoulder pain/ limitations.    Baseline  No lifting with L shoulder at this time.    Time  4    Period  Weeks    Status  New    Target Date  10/13/19            Plan - 09/17/19 1842    Clinical Impression Statement  Pt. demonstrates good understand of passive shoulder mvmt. with pulley ex.  PT encouraged to complete HEP at least 2x/day.  Pt. able to independently don/ doff sling after demonstration.  Pt. progressing well with L shoulder PROM in supine position in a pain tolerable range.  Pt. able to relax L shoulder during PROM to allow for controlled manual tx.  Good incision healing.  Pt. instructed in STM to L shoulder with showering.  Examination-Activity Limitations  Bed Mobility;Bathing;Reach Overhead;Carry;Dressing;Sleep;Lift    Stability/Clinical Decision Making  Evolving/Moderate complexity    Clinical Decision Making  Moderate    Rehab Potential  Good    PT Frequency  2x / week    PT Duration  4 weeks    PT Treatment/Interventions  ADLs/Self Care Home Management;Cryotherapy;Electrical Stimulation;Moist Heat;Therapeutic exercise;Therapeutic activities;Functional mobility training;Neuromuscular re-education;Patient/family education;Manual techniques;Passive range of motion;Scar mobilization    PT Next Visit Plan  Increase L shoulder PROM (follow MD protocol).    PT Home Exercise Plan  [id number]    Consulted and Agree with Plan of Care  Patient       Patient will benefit from skilled therapeutic intervention in order to improve the following deficits and impairments:  Improper body mechanics, Pain, Decreased coordination, Decreased mobility, Decreased activity tolerance, Decreased endurance, Decreased range of motion, Decreased strength, Impaired UE functional use, Impaired perceived functional ability, Hypomobility, Impaired flexibility  Visit Diagnosis: S/P complete repair of rotator cuff  Acute pain of left shoulder  Limited range of motion of shoulder  Weakness of shoulder  Muscle weakness (generalized)     Problem List Patient Active Problem List   Diagnosis Date Noted  . Nephrolithiasis 01/26/2017  . Left ureteral stone 01/03/2017  . Endometrial disorder 07/08/2015  . Bilateral occipital neuralgia 03/02/2015  . DDD (degenerative disc disease), lumbar 12/24/2014  . Facet syndrome, lumbar 12/24/2014  . Sacroiliac joint dysfunction 12/24/2014   Pura Spice, PT, DPT # [phone] 09/17/2019, 6:59 PM  [facility] [address] [place], [place], [address] Phone: [phone]   Fax:  [phone]  Name: CHARLA CRISCIONE MRN: [id number] Date of Birth:  [birth year]

## 2019-09-18 ENCOUNTER — Encounter: Payer: BC Managed Care – PPO | Admitting: Physical Therapy

## 2019-09-22 ENCOUNTER — Ambulatory Visit: Payer: Medicare Other | Admitting: Physical Therapy

## 2019-09-22 ENCOUNTER — Other Ambulatory Visit: Payer: Self-pay

## 2019-09-22 DIAGNOSIS — Z9889 Other specified postprocedural states: Secondary | ICD-10-CM | POA: Diagnosis not present

## 2019-09-22 DIAGNOSIS — M25512 Pain in left shoulder: Secondary | ICD-10-CM

## 2019-09-22 DIAGNOSIS — M25619 Stiffness of unspecified shoulder, not elsewhere classified: Secondary | ICD-10-CM

## 2019-09-22 DIAGNOSIS — M6281 Muscle weakness (generalized): Secondary | ICD-10-CM

## 2019-09-22 DIAGNOSIS — R29898 Other symptoms and signs involving the musculoskeletal system: Secondary | ICD-10-CM

## 2019-09-23 ENCOUNTER — Encounter: Payer: Self-pay | Admitting: Physical Therapy

## 2019-09-23 ENCOUNTER — Encounter: Payer: BC Managed Care – PPO | Admitting: Physical Therapy

## 2019-09-23 NOTE — Therapy (Signed)
[facility] [address] [place], [place], [address] Phone: [phone]   Fax:  [phone]  Physical Therapy Treatment  Patient Details  Name: Mariah Reeves MRN: [id number] Date of Birth: 1965-11-18 Referring Provider (PT): Dr. Martha Clan   Encounter Date: 09/22/2019  PT End of Session - 09/23/19 1902    Visit Number  3    Number of Visits  9    Date for PT Re-Evaluation  10/13/19    Authorization - Visit Number  3    Authorization - Number of Visits  10    PT Start Time  0858    PT Stop Time  0946    PT Time Calculation (min)  48 min    Activity Tolerance  Patient tolerated treatment well;Patient limited by pain    Behavior During Therapy  [facility] for tasks assessed/performed       Past Medical History:  Diagnosis Date  . Allergy   . Anomaly, uterus    tumor of uterus  . Anxiety   . Bilateral occipital neuralgia   . Bipolar 1 disorder (HCC)   . Chronic back pain   . Complication of anesthesia    hard time waking up after gastric bypass.Pt stated that she has a small mouth and has had several surgeries since gastric bypass with no problems.  . Degenerative disc disease, lumbar   . Depression   . Facet syndrome, lumbar   . Family history of breast cancer    2/21 cancer genetic testing letter sent  . GERD (gastroesophageal reflux disease)    before gastric bypass  . Headache    Migraines  . History of kidney stones    Left  . Hx of vertigo   . Patella fracture    Left  . PTSD (post-traumatic stress disorder)   . Sleep apnea    C-PAP  . Wears glasses     Past Surgical History:  Procedure Laterality Date  . ABDOMINOPLASTY  2006  . CARPAL TUNNEL RELEASE Left   . CHOLECYSTECTOMY    . COLONOSCOPY W/ POLYPECTOMY    . CYSTOSCOPY W/ URETERAL STENT PLACEMENT  01/03/2017   Procedure: CYSTOSCOPY WITH RETROGRADE PYELOGRAM/URETERAL STENT PLACEMENT;  Surgeon: Hildred Laser, MD;  Location: [facility];  Service: Urology;;   . Bluford Kaufmann W/ URETERAL STENT PLACEMENT Left 01/17/2017   Procedure: CYSTOSCOPY WITH STENT REPLACEMENT;  Surgeon: Hildred Laser, MD;  Location: [facility];  Service: Urology;  Laterality: Left;  . DILATATION & CURETTAGE/HYSTEROSCOPY WITH MYOSURE N/A 07/08/2015   Procedure: DILATATION & CURETTAGE/HYSTEROSCOPY WITH MYOSURE;  Surgeon: Nadara Mustard, MD;  Location: [facility];  Service: Gynecology;  Laterality: N/A;  . DILATION AND CURETTAGE OF UTERUS     1/17  . EXTRACORPOREAL SHOCK WAVE LITHOTRIPSY Left 12/07/2016   Procedure: EXTRACORPOREAL SHOCK WAVE LITHOTRIPSY (ESWL);  Surgeon: Hildred Laser, MD;  Location: [facility];  Service: Urology;  Laterality: Left;  Marland Kitchen GASTRIC BYPASS  2005  . KNEE SURGERY Right    ACL- repair  . LITHOTRIPSY    . MIDDLE EAR SURGERY    . NASAL SEPTUM SURGERY    . OVARY SURGERY Right   . URETEROSCOPY WITH HOLMIUM LASER LITHOTRIPSY     perforated ureter. had nephrostomy tube for brief time  . URETEROSCOPY WITH HOLMIUM LASER LITHOTRIPSY Left 01/17/2017   Procedure: URETEROSCOPY WITH HOLMIUM LASER LITHOTRIPSY;  Surgeon: Hildred Laser, MD;  Location: [facility];  Service: Urology;  Laterality: Left;  There were no vitals filed for this visit.  Subjective Assessment - 09/23/19 1854    Subjective  Pt. states she had difficulty with shoulder pulleys at home due to pain.  PT reviewed and instructed pt. to relax L shoulder and allow R shoulder to complete 100% PROM to assist L.    Pertinent History  Pt. known well to PT.  See previous PT treatment notes.    Patient Stated Goals  Increase L shoulder ROM/ pain mgmt.  Return to driving.    Currently in Pain?  Yes    Pain Score  6     Pain Location  Shoulder    Pain Orientation  Left    Pain Descriptors / Indicators  Aching         There.ex.:  Seated shoulder pulley ex. (flexion/ abduction)- 20x each.   PT reviewed proper technique.  Marked increase in L sh. Flexion/ abduction.  Supine L elbow flexion/  extension/ pron./ sup. 10x2 Supine L shoulder serratus punches 10x2  Manual tx.:  Supine L shoulder STM to proximal biceps/ deltoid/ UT musculature 6 min. Supine L shoulder PROM (all planes per MD protocol).  Focus on ER/ flexion/ abduction.    Standing wall ladder with L shoulder (flexion marked with sticker).        PT Long Term Goals - 09/17/19 0743      PT LONG TERM GOAL #1   Title  Pt will score at least 64 on FOTO to demonstrate increased functional mobility.    Baseline  Initial FOTO: 40    Time  4    Period  Weeks    Status  New    Target Date  10/13/19      PT LONG TERM GOAL #2   Title  Pt will decrease worst pain as reported on NPRS by at least 3 points in order to demonstrate clinically significant reduction in pain.    Baseline  Worst pain >5/10 L shoulder    Time  4    Period  Weeks    Status  New    Target Date  10/13/19      PT LONG TERM GOAL #3   Title  Pt. independent with HEP to increase L shoulder AROM to WNL (all planes) as compared to R shoulder to improve functional mobility.    Baseline  L shoulder PROM: flexion (106 deg.), abduction (105 deg.), ER (28 deg.), IR (58 deg.).    Time  4    Period  Weeks    Status  New    Target Date  10/13/19      PT LONG TERM GOAL #4   Title  Pt. able to lift a gallon of milk/ grocery bags with no L shoulder pain/ limitations.    Baseline  No lifting with L shoulder at this time.    Time  4    Period  Weeks    Status  New    Target Date  10/13/19            Plan - 09/23/19 1902    Clinical Impression Statement  Pt. progressing well with L shoulder PROM: flexion 134 deg./ abduction 136 deg. in pain tolerable range.  Good incision healing with no steristrips in place. PT reviewed MD protocol with L shoulder PROM progressing to [facility] and use of sling.  Minimal tendenress with palpation along L supraspinatus/ proximal biceps musculature.  No change to HEP and pt. encourage to return to completing pulley  ex.  in pain tolerable range.    Examination-Activity Limitations  Bed Mobility;Bathing;Reach Overhead;Carry;Dressing;Sleep;Lift    Stability/Clinical Decision Making  Evolving/Moderate complexity    Clinical Decision Making  Moderate    Rehab Potential  Good    PT Frequency  2x / week    PT Duration  4 weeks    PT Treatment/Interventions  ADLs/Self Care Home Management;Cryotherapy;Electrical Stimulation;Moist Heat;Therapeutic exercise;Therapeutic activities;Functional mobility training;Neuromuscular re-education;Patient/family education;Manual techniques;Passive range of motion;Scar mobilization    PT Next Visit Plan  Increase L shoulder PROM (follow MD protocol).    PT Home Exercise Plan  [id number]    Consulted and Agree with Plan of Care  Patient       Patient will benefit from skilled therapeutic intervention in order to improve the following deficits and impairments:  Improper body mechanics, Pain, Decreased coordination, Decreased mobility, Decreased activity tolerance, Decreased endurance, Decreased range of motion, Decreased strength, Impaired UE functional use, Impaired perceived functional ability, Hypomobility, Impaired flexibility  Visit Diagnosis: S/P complete repair of rotator cuff  Acute pain of left shoulder  Limited range of motion of shoulder  Weakness of shoulder  Muscle weakness (generalized)     Problem List Patient Active Problem List   Diagnosis Date Noted  . Nephrolithiasis 01/26/2017  . Left ureteral stone 01/03/2017  . Endometrial disorder 07/08/2015  . Bilateral occipital neuralgia 03/02/2015  . DDD (degenerative disc disease), lumbar 12/24/2014  . Facet syndrome, lumbar 12/24/2014  . Sacroiliac joint dysfunction 12/24/2014   Pura Spice, PT, DPT # [phone] 09/23/2019, 7:06 PM  [facility] [address] [place], [place], [address] Phone: [phone]   Fax:  [phone]  Name: Mariah Reeves MRN:  [id number] Date of Birth: 1965-08-11

## 2019-09-24 ENCOUNTER — Encounter: Payer: Self-pay | Admitting: Physical Therapy

## 2019-09-24 ENCOUNTER — Ambulatory Visit: Payer: Medicare Other | Admitting: Physical Therapy

## 2019-09-24 ENCOUNTER — Other Ambulatory Visit: Payer: Self-pay

## 2019-09-24 DIAGNOSIS — Z9889 Other specified postprocedural states: Secondary | ICD-10-CM | POA: Diagnosis not present

## 2019-09-24 DIAGNOSIS — R29898 Other symptoms and signs involving the musculoskeletal system: Secondary | ICD-10-CM

## 2019-09-24 DIAGNOSIS — M6281 Muscle weakness (generalized): Secondary | ICD-10-CM

## 2019-09-24 DIAGNOSIS — M25619 Stiffness of unspecified shoulder, not elsewhere classified: Secondary | ICD-10-CM

## 2019-09-24 DIAGNOSIS — M25512 Pain in left shoulder: Secondary | ICD-10-CM

## 2019-09-24 NOTE — Therapy (Signed)
[facility] [address] [place], [place], [address] Phone: [phone]   Fax:  [phone]  Physical Therapy Treatment  Patient Details  Name: Mariah Reeves MRN: [id number] Date of Birth: 11/21/1965 Referring Provider (PT): Dr. Martha Clan   Encounter Date: 09/24/2019  PT End of Session - 09/24/19 0900    Visit Number  4    Number of Visits  9    Date for PT Re-Evaluation  10/13/19    Authorization - Visit Number  4    Authorization - Number of Visits  10    PT Start Time  0854    PT Stop Time  0944    PT Time Calculation (min)  50 min    Activity Tolerance  Patient tolerated treatment well;Patient limited by pain    Behavior During Therapy  [facility] for tasks assessed/performed       Past Medical History:  Diagnosis Date  . Allergy   . Anomaly, uterus    tumor of uterus  . Anxiety   . Bilateral occipital neuralgia   . Bipolar 1 disorder (HCC)   . Chronic back pain   . Complication of anesthesia    hard time waking up after gastric bypass.Pt stated that she has a small mouth and has had several surgeries since gastric bypass with no problems.  . Degenerative disc disease, lumbar   . Depression   . Facet syndrome, lumbar   . Family history of breast cancer    2/21 cancer genetic testing letter sent  . GERD (gastroesophageal reflux disease)    before gastric bypass  . Headache    Migraines  . History of kidney stones    Left  . Hx of vertigo   . Patella fracture    Left  . PTSD (post-traumatic stress disorder)   . Sleep apnea    C-PAP  . Wears glasses     Past Surgical History:  Procedure Laterality Date  . ABDOMINOPLASTY  2006  . CARPAL TUNNEL RELEASE Left   . CHOLECYSTECTOMY    . COLONOSCOPY W/ POLYPECTOMY    . CYSTOSCOPY W/ URETERAL STENT PLACEMENT  01/03/2017   Procedure: CYSTOSCOPY WITH RETROGRADE PYELOGRAM/URETERAL STENT PLACEMENT;  Surgeon: Hildred Laser, MD;  Location: [facility];  Service: Urology;;   . Bluford Kaufmann W/ URETERAL STENT PLACEMENT Left 01/17/2017   Procedure: CYSTOSCOPY WITH STENT REPLACEMENT;  Surgeon: Hildred Laser, MD;  Location: [facility];  Service: Urology;  Laterality: Left;  . DILATATION & CURETTAGE/HYSTEROSCOPY WITH MYOSURE N/A 07/08/2015   Procedure: DILATATION & CURETTAGE/HYSTEROSCOPY WITH MYOSURE;  Surgeon: Nadara Mustard, MD;  Location: [facility];  Service: Gynecology;  Laterality: N/A;  . DILATION AND CURETTAGE OF UTERUS     1/17  . EXTRACORPOREAL SHOCK WAVE LITHOTRIPSY Left 12/07/2016   Procedure: EXTRACORPOREAL SHOCK WAVE LITHOTRIPSY (ESWL);  Surgeon: Hildred Laser, MD;  Location: [facility];  Service: Urology;  Laterality: Left;  Marland Kitchen GASTRIC BYPASS  2005  . KNEE SURGERY Right    ACL- repair  . LITHOTRIPSY    . MIDDLE EAR SURGERY    . NASAL SEPTUM SURGERY    . OVARY SURGERY Right   . URETEROSCOPY WITH HOLMIUM LASER LITHOTRIPSY     perforated ureter. had nephrostomy tube for brief time  . URETEROSCOPY WITH HOLMIUM LASER LITHOTRIPSY Left 01/17/2017   Procedure: URETEROSCOPY WITH HOLMIUM LASER LITHOTRIPSY;  Surgeon: Hildred Laser, MD;  Location: [facility];  Service: Urology;  Laterality: Left;  There were no vitals filed for this visit.  Subjective Assessment - 09/24/19 0859    Subjective  Pt. states pulley ex. is doing much better.  Pt. states she is getting tired of wearing sling.  Pt. reports 4/10 L shoulder pain prior to tx. session.    Pertinent History  Pt. known well to PT.  See previous PT treatment notes.    Patient Stated Goals  Increase L shoulder ROM/ pain mgmt.  Return to driving.    Currently in Pain?  Yes    Pain Score  4     Pain Orientation  Left    Pain Descriptors / Indicators  Aching         There.ex.:  Standing wall ladder with L shoulder (increase by 1 rung/ marked with sticker) B UBE 3 min. F/b (R shoulder AA/PROM).   Supine L sh. Serratus punches/ wand chest press/ sh. Flexion/ tricep extension 20x each (PT assist as  needed with chest press).    Manual tx.:  Seated/supine L shoulder STM to proximal biceps/ deltoid/ UT musculature 9 min. ("feels good") Supine L shoulder PROM (all planes per MD protocol).  Increase sh. Flexion/ abduction/ ER    PT Long Term Goals - 09/17/19 0743      PT LONG TERM GOAL #1   Title  Pt will score at least 64 on FOTO to demonstrate increased functional mobility.    Baseline  Initial FOTO: 40    Time  4    Period  Weeks    Status  New    Target Date  10/13/19      PT LONG TERM GOAL #2   Title  Pt will decrease worst pain as reported on NPRS by at least 3 points in order to demonstrate clinically significant reduction in pain.    Baseline  Worst pain >5/10 L shoulder    Time  4    Period  Weeks    Status  New    Target Date  10/13/19      PT LONG TERM GOAL #3   Title  Pt. independent with HEP to increase L shoulder AROM to WNL (all planes) as compared to R shoulder to improve functional mobility.    Baseline  L shoulder PROM: flexion (106 deg.), abduction (105 deg.), ER (28 deg.), IR (58 deg.).    Time  4    Period  Weeks    Status  New    Target Date  10/13/19      PT LONG TERM GOAL #4   Title  Pt. able to lift a gallon of milk/ grocery bags with no L shoulder pain/ limitations.    Baseline  No lifting with L shoulder at this time.    Time  4    Period  Weeks    Status  New    Target Date  10/13/19            Plan - 09/24/19 1229    Clinical Impression Statement  Pt. continues to present with good L shoulder PROM in seated/ supine position.  Good incision healing and decrease tenderness over L deltoid musculature during STM.  Pt. demonstrated supine sh. flexion/ press-ups with wand and assist of R UE.  No increase c/o pain during tx. session.    Examination-Activity Limitations  Bed Mobility;Bathing;Reach Overhead;Carry;Dressing;Sleep;Lift    Stability/Clinical Decision Making  Evolving/Moderate complexity    Clinical Decision Making  Moderate     Rehab Potential  Good  PT Frequency  2x / week    PT Duration  4 weeks    PT Treatment/Interventions  ADLs/Self Care Home Management;Cryotherapy;Electrical Stimulation;Moist Heat;Therapeutic exercise;Therapeutic activities;Functional mobility training;Neuromuscular re-education;Patient/family education;Manual techniques;Passive range of motion;Scar mobilization    PT Next Visit Plan  Increase L shoulder PROM (follow MD protocol).    PT Home Exercise Plan  [id number]    Consulted and Agree with Plan of Care  Patient       Patient will benefit from skilled therapeutic intervention in order to improve the following deficits and impairments:  Improper body mechanics, Pain, Decreased coordination, Decreased mobility, Decreased activity tolerance, Decreased endurance, Decreased range of motion, Decreased strength, Impaired UE functional use, Impaired perceived functional ability, Hypomobility, Impaired flexibility  Visit Diagnosis: S/P complete repair of rotator cuff  Acute pain of left shoulder  Limited range of motion of shoulder  Weakness of shoulder  Muscle weakness (generalized)     Problem List Patient Active Problem List   Diagnosis Date Noted  . Nephrolithiasis 01/26/2017  . Left ureteral stone 01/03/2017  . Endometrial disorder 07/08/2015  . Bilateral occipital neuralgia 03/02/2015  . DDD (degenerative disc disease), lumbar 12/24/2014  . Facet syndrome, lumbar 12/24/2014  . Sacroiliac joint dysfunction 12/24/2014   Cammie Mcgee, PT, DPT # [phone] 09/24/2019, 12:36 PM  [facility] [address] [place], [place], [address] Phone: [phone]   Fax:  [phone]  Name: ZYKERIAH MATHIA MRN: [id number] Date of Birth: November 16, 1965

## 2019-09-25 ENCOUNTER — Encounter: Payer: BC Managed Care – PPO | Admitting: Physical Therapy

## 2019-09-29 ENCOUNTER — Ambulatory Visit: Payer: Medicare Other | Admitting: Physical Therapy

## 2019-09-29 DIAGNOSIS — M6281 Muscle weakness (generalized): Secondary | ICD-10-CM

## 2019-09-29 DIAGNOSIS — M25619 Stiffness of unspecified shoulder, not elsewhere classified: Secondary | ICD-10-CM

## 2019-09-29 DIAGNOSIS — M25512 Pain in left shoulder: Secondary | ICD-10-CM

## 2019-09-29 DIAGNOSIS — R29898 Other symptoms and signs involving the musculoskeletal system: Secondary | ICD-10-CM

## 2019-09-29 DIAGNOSIS — Z9889 Other specified postprocedural states: Secondary | ICD-10-CM

## 2019-09-30 ENCOUNTER — Encounter: Payer: BC Managed Care – PPO | Admitting: Physical Therapy

## 2019-10-01 ENCOUNTER — Ambulatory Visit: Payer: Medicare Other | Admitting: Physical Therapy

## 2019-10-01 ENCOUNTER — Encounter: Payer: Self-pay | Admitting: Physical Therapy

## 2019-10-01 ENCOUNTER — Other Ambulatory Visit: Payer: Self-pay

## 2019-10-01 DIAGNOSIS — M6281 Muscle weakness (generalized): Secondary | ICD-10-CM

## 2019-10-01 DIAGNOSIS — Z9889 Other specified postprocedural states: Secondary | ICD-10-CM | POA: Diagnosis not present

## 2019-10-01 DIAGNOSIS — R29898 Other symptoms and signs involving the musculoskeletal system: Secondary | ICD-10-CM

## 2019-10-01 DIAGNOSIS — M25619 Stiffness of unspecified shoulder, not elsewhere classified: Secondary | ICD-10-CM

## 2019-10-01 DIAGNOSIS — M25512 Pain in left shoulder: Secondary | ICD-10-CM

## 2019-10-01 NOTE — Therapy (Signed)
[place] [facility] [address] [place], [place], [address] Phone: [phone]   Fax:  [phone]  Physical Therapy Treatment  Patient Details  Name: Mariah Reeves MRN: [id number] Date of Birth: [birth year] Referring Provider (PT): Dr. Mack Guise   Encounter Date: 09/29/2019  PT End of Session - 10/01/19 1924    Visit Number  5    Number of Visits  9    Date for PT Re-Evaluation  10/13/19    Authorization - Visit Number  5    Authorization - Number of Visits  10    PT Start Time  0904    PT Stop Time  0950    PT Time Calculation (min)  46 min    Activity Tolerance  Patient tolerated treatment well;Patient limited by pain    Behavior During Therapy  [facility] for tasks assessed/performed       Past Medical History:  Diagnosis Date  . Allergy   . Anomaly, uterus    tumor of uterus  . Anxiety   . Bilateral occipital neuralgia   . Bipolar 1 disorder ([place])   . Chronic back pain   . Complication of anesthesia    hard time waking up after gastric bypass.Pt stated that she has a small mouth and has had several surgeries since gastric bypass with no problems.  . Degenerative disc disease, lumbar   . Depression   . Facet syndrome, lumbar   . Family history of breast cancer    2/21 cancer genetic testing letter sent  . GERD (gastroesophageal reflux disease)    before gastric bypass  . Headache    Migraines  . History of kidney stones    Left  . Hx of vertigo   . Patella fracture    Left  . PTSD (post-traumatic stress disorder)   . Sleep apnea    C-PAP  . Wears glasses     Past Surgical History:  Procedure Laterality Date  . ABDOMINOPLASTY  2006  . CARPAL TUNNEL RELEASE Left   . CHOLECYSTECTOMY    . COLONOSCOPY W/ POLYPECTOMY    . CYSTOSCOPY W/ URETERAL STENT PLACEMENT  01/03/2017   Procedure: CYSTOSCOPY WITH RETROGRADE PYELOGRAM/URETERAL STENT PLACEMENT;  Surgeon: Nickie Retort, MD;  Location: [facility];  Service: Urology;;   . Consuela Mimes W/ URETERAL STENT PLACEMENT Left 01/17/2017   Procedure: CYSTOSCOPY WITH STENT REPLACEMENT;  Surgeon: Nickie Retort, MD;  Location: [facility];  Service: Urology;  Laterality: Left;  . DILATATION & CURETTAGE/HYSTEROSCOPY WITH MYOSURE N/A 07/08/2015   Procedure: DILATATION & CURETTAGE/HYSTEROSCOPY WITH MYOSURE;  Surgeon: Gae Dry, MD;  Location: [facility];  Service: Gynecology;  Laterality: N/A;  . DILATION AND CURETTAGE OF UTERUS     1/17  . EXTRACORPOREAL SHOCK WAVE LITHOTRIPSY Left 12/07/2016   Procedure: EXTRACORPOREAL SHOCK WAVE LITHOTRIPSY (ESWL);  Surgeon: Nickie Retort, MD;  Location: [facility];  Service: Urology;  Laterality: Left;  Marland Kitchen GASTRIC BYPASS  2005  . KNEE SURGERY Right    ACL- repair  . LITHOTRIPSY    . MIDDLE EAR SURGERY    . NASAL SEPTUM SURGERY    . OVARY SURGERY Right   . URETEROSCOPY WITH HOLMIUM LASER LITHOTRIPSY     perforated ureter. had nephrostomy tube for brief time  . URETEROSCOPY WITH HOLMIUM LASER LITHOTRIPSY Left 01/17/2017   Procedure: URETEROSCOPY WITH HOLMIUM LASER LITHOTRIPSY;  Surgeon: Nickie Retort, MD;  Location: [facility];  Service: Urology;  Laterality: Left;  There were no vitals filed for this visit.  Subjective Assessment - 10/01/19 1922    Subjective  Pt. continues to wear sling this week and returns to MD this Friday.  Pt. reports minimal L shoulder pain at this time.    Pertinent History  Pt. known well to PT.  See previous PT treatment notes.    Patient Stated Goals  Increase L shoulder ROM/ pain mgmt.  Return to driving.    Currently in Pain?  Yes    Pain Score  4     Pain Location  Shoulder    Pain Orientation  Left    Pain Descriptors / Indicators  Aching    Pain Type  Surgical pain         There.ex.:  Seated pulley ex. (flexion/ abduction)- cuing to slow down/increase hold time. B UBE 3 min. F/b (R shoulder AA/PROM).   Supine L sh. Serratus punches/ wand chest press/ sh. Flexion/ tricep  extension 20x each (PT assist as needed with chest press).   Standing 2# sh. pendulum  Manual tx.:  Seated/supine L shoulder STM to proximal biceps/ deltoid/ UT musculature66min. (minimal tenderness/ no issues) Supine L shoulder AA/PROM (all planes per MD protocol). Increase sh. Flexion/ abduction/ ER    PT Long Term Goals - 09/17/19 0743      PT LONG TERM GOAL #1   Title  Pt will score at least 64 on FOTO to demonstrate increased functional mobility.    Baseline  Initial FOTO: 40    Time  4    Period  Weeks    Status  New    Target Date  10/13/19      PT LONG TERM GOAL #2   Title  Pt will decrease worst pain as reported on NPRS by at least 3 points in order to demonstrate clinically significant reduction in pain.    Baseline  Worst pain >5/10 L shoulder    Time  4    Period  Weeks    Status  New    Target Date  10/13/19      PT LONG TERM GOAL #3   Title  Pt. independent with HEP to increase L shoulder AROM to WNL (all planes) as compared to R shoulder to improve functional mobility.    Baseline  L shoulder PROM: flexion (106 deg.), abduction (105 deg.), ER (28 deg.), IR (58 deg.).    Time  4    Period  Weeks    Status  New    Target Date  10/13/19      PT LONG TERM GOAL #4   Title  Pt. able to lift a gallon of milk/ grocery bags with no L shoulder pain/ limitations.    Baseline  No lifting with L shoulder at this time.    Time  4    Period  Weeks    Status  New    Target Date  10/13/19            Plan - 10/01/19 1924    Clinical Impression Statement  Pt. continues to progress well with L shoulder AA/PROM in seated and supine position.  Pt. returns to MD this Friday and will continue to wear sling until cleared by MD.  Good L sh. capsular mobility (all planes).  Pt. completes UBE with no increase sh. pain or UT compensation.  PT will reassess ROM next tx. session.  No change to HEP.    Examination-Activity Limitations  Bed Mobility;Bathing;Reach  Overhead;Carry;Dressing;Sleep;Lift    Stability/Clinical Decision Making  Evolving/Moderate complexity    Clinical Decision Making  Moderate    Rehab Potential  Good    PT Frequency  2x / week    PT Duration  4 weeks    PT Treatment/Interventions  ADLs/Self Care Home Management;Cryotherapy;Electrical Stimulation;Moist Heat;Therapeutic exercise;Therapeutic activities;Functional mobility training;Neuromuscular re-education;Patient/family education;Manual techniques;Passive range of motion;Scar mobilization    PT Next Visit Plan  Increase L shoulder AA/PROM (follow MD protocol).    PT Home Exercise Plan  [id number]    Consulted and Agree with Plan of Care  Patient       Patient will benefit from skilled therapeutic intervention in order to improve the following deficits and impairments:  Improper body mechanics, Pain, Decreased coordination, Decreased mobility, Decreased activity tolerance, Decreased endurance, Decreased range of motion, Decreased strength, Impaired UE functional use, Impaired perceived functional ability, Hypomobility, Impaired flexibility  Visit Diagnosis: S/P complete repair of rotator cuff  Acute pain of left shoulder  Limited range of motion of shoulder  Weakness of shoulder  Muscle weakness (generalized)     Problem List Patient Active Problem List   Diagnosis Date Noted  . Nephrolithiasis 01/26/2017  . Left ureteral stone 01/03/2017  . Endometrial disorder 07/08/2015  . Bilateral occipital neuralgia 03/02/2015  . DDD (degenerative disc disease), lumbar 12/24/2014  . Facet syndrome, lumbar 12/24/2014  . Sacroiliac joint dysfunction 12/24/2014   Cammie Mcgee, PT, DPT # [phone] 10/01/2019, 7:40 PM  [place] [facility] [address] [place], [place], [address] Phone: [phone]   Fax:  [phone]  Name: Mariah Reeves MRN: [id number] Date of Birth: 02/16/1966

## 2019-10-02 ENCOUNTER — Encounter: Payer: BC Managed Care – PPO | Admitting: Physical Therapy

## 2019-10-04 NOTE — Therapy (Addendum)
[place] [facility] [address] [place], [place], [address] Phone: [phone]   Fax:  [phone]  Physical Therapy Treatment  Patient Details  Name: Mariah Reeves MRN: [id number] Date of Birth: 05-09-1966 Referring Provider (PT): Dr. Martha Clan   Encounter Date: 10/01/2019    PT End of Session - 10/04/19 1041    Visit Number  6    Number of Visits  9    Date for PT Re-Evaluation  10/13/19    Authorization - Visit Number  6    Authorization - Number of Visits  10    PT Start Time  0902    PT Stop Time  0951    PT Time Calculation (min)  49 min    Activity Tolerance  Patient tolerated treatment well;Patient limited by pain    Behavior During Therapy  [facility] for tasks assessed/performed         Past Medical History:  Diagnosis Date  . Allergy   . Anomaly, uterus    tumor of uterus  . Anxiety   . Bilateral occipital neuralgia   . Bipolar 1 disorder (HCC)   . Chronic back pain   . Complication of anesthesia    hard time waking up after gastric bypass.Pt stated that she has a small mouth and has had several surgeries since gastric bypass with no problems.  . Degenerative disc disease, lumbar   . Depression   . Facet syndrome, lumbar   . Family history of breast cancer    2/21 cancer genetic testing letter sent  . GERD (gastroesophageal reflux disease)    before gastric bypass  . Headache    Migraines  . History of kidney stones    Left  . Hx of vertigo   . Patella fracture    Left  . PTSD (post-traumatic stress disorder)   . Sleep apnea    C-PAP  . Wears glasses     Past Surgical History:  Procedure Laterality Date  . ABDOMINOPLASTY  2006  . CARPAL TUNNEL RELEASE Left   . CHOLECYSTECTOMY    . COLONOSCOPY W/ POLYPECTOMY    . CYSTOSCOPY W/ URETERAL STENT PLACEMENT  01/03/2017   Procedure: CYSTOSCOPY WITH RETROGRADE PYELOGRAM/URETERAL STENT PLACEMENT;  Surgeon: Hildred Laser, MD;  Location: [facility];  Service:  Urology;;  . Bluford Kaufmann W/ URETERAL STENT PLACEMENT Left 01/17/2017   Procedure: CYSTOSCOPY WITH STENT REPLACEMENT;  Surgeon: Hildred Laser, MD;  Location: [facility];  Service: Urology;  Laterality: Left;  . DILATATION & CURETTAGE/HYSTEROSCOPY WITH MYOSURE N/A 07/08/2015   Procedure: DILATATION & CURETTAGE/HYSTEROSCOPY WITH MYOSURE;  Surgeon: Nadara Mustard, MD;  Location: [facility];  Service: Gynecology;  Laterality: N/A;  . DILATION AND CURETTAGE OF UTERUS     1/17  . EXTRACORPOREAL SHOCK WAVE LITHOTRIPSY Left 12/07/2016   Procedure: EXTRACORPOREAL SHOCK WAVE LITHOTRIPSY (ESWL);  Surgeon: Hildred Laser, MD;  Location: [facility];  Service: Urology;  Laterality: Left;  Marland Kitchen GASTRIC BYPASS  2005  . KNEE SURGERY Right    ACL- repair  . LITHOTRIPSY    . MIDDLE EAR SURGERY    . NASAL SEPTUM SURGERY    . OVARY SURGERY Right   . URETEROSCOPY WITH HOLMIUM LASER LITHOTRIPSY     perforated ureter. had nephrostomy tube for brief time  . URETEROSCOPY WITH HOLMIUM LASER LITHOTRIPSY Left 01/17/2017   Procedure: URETEROSCOPY WITH HOLMIUM LASER LITHOTRIPSY;  Surgeon: Hildred Laser, MD;  Location: [facility];  Service: Urology;  Laterality:  Left;    There were no vitals filed for this visit.  Subjective Assessment - 10/12/19 1840    Subjective  Pt. c/o minimal L shoulder pain at this time and is starting to wean off sling prior to upcoming MD f/u visit.    Pertinent History  Pt. known well to PT.  See previous PT treatment notes.    Patient Stated Goals  Increase L shoulder ROM/ pain mgmt.  Return to driving.    Currently in Pain?  Yes    Pain Score  4     Pain Location  Shoulder    Pain Orientation  Left    Pain Descriptors / Indicators  Aching    Pain Type  Chronic pain;Surgical pain       Manual tx.:  Seated/supine L shoulder STM to proximal biceps/ deltoid/ UT musculature78min.(minimal tenderness/ no issues) Supine L shoulder AA/PROM (all planes per MD protocol).Increase sh.  Flexion/ abduction/ ER   There.ex.:  B UBE 3 min. F/b (R shoulder AA/PROM).  Supine L sh. Serratus punches/ wand chest press/ sh. Flexion/ tricep extension 20x each (PT assist as needed with chest press).  Standing wall ladder (sh. Flexion)- PT assist.    PT Long Term Goals - 09/17/19 0743      PT LONG TERM GOAL #1   Title  Pt will score at least 64 on FOTO to demonstrate increased functional mobility.    Baseline  Initial FOTO: 40    Time  4    Period  Weeks    Status  New    Target Date  10/13/19      PT LONG TERM GOAL #2   Title  Pt will decrease worst pain as reported on NPRS by at least 3 points in order to demonstrate clinically significant reduction in pain.    Baseline  Worst pain >5/10 L shoulder    Time  4    Period  Weeks    Status  New    Target Date  10/13/19      PT LONG TERM GOAL #3   Title  Pt. independent with HEP to increase L shoulder AROM to WNL (all planes) as compared to R shoulder to improve functional mobility.    Baseline  L shoulder PROM: flexion (106 deg.), abduction (105 deg.), ER (28 deg.), IR (58 deg.).    Time  4    Period  Weeks    Status  New    Target Date  10/13/19      PT LONG TERM GOAL #4   Title  Pt. able to lift a gallon of milk/ grocery bags with no L shoulder pain/ limitations.    Baseline  No lifting with L shoulder at this time.    Time  4    Period  Weeks    Status  New    Target Date  10/13/19            Plan - 10/12/19 1841    Clinical Impression Statement  Pt. doing well with L shoulder AA/PROM in supine position: flexion 132 deg./ abduction 130 deg./ ER 58 deg.  Pt. remains compliant with MD protocol and will be able to complete more A/AROM after MD f/u.  Pt. instructed to contact PT if any change in POC after f/u with MD.    Examination-Activity Limitations  Bed Mobility;Bathing;Reach Overhead;Carry;Dressing;Sleep;Lift    Stability/Clinical Decision Making  Evolving/Moderate complexity    Clinical Decision  Making  Moderate  Rehab Potential  Good    PT Frequency  2x / week    PT Duration  4 weeks    PT Treatment/Interventions  ADLs/Self Care Home Management;Cryotherapy;Electrical Stimulation;Moist Heat;Therapeutic exercise;Therapeutic activities;Functional mobility training;Neuromuscular re-education;Patient/family education;Manual techniques;Passive range of motion;Scar mobilization    PT Next Visit Plan  Increase L shoulder AA/PROM (follow MD protocol).  Discuss MD f/u.    PT Home Exercise Plan  [id number]    Consulted and Agree with Plan of Care  Patient       Patient will benefit from skilled therapeutic intervention in order to improve the following deficits and impairments:  Improper body mechanics, Pain, Decreased coordination, Decreased mobility, Decreased activity tolerance, Decreased endurance, Decreased range of motion, Decreased strength, Impaired UE functional use, Impaired perceived functional ability, Hypomobility, Impaired flexibility  Visit Diagnosis: S/P complete repair of rotator cuff  Acute pain of left shoulder  Limited range of motion of shoulder  Weakness of shoulder  Muscle weakness (generalized)     Problem List Patient Active Problem List   Diagnosis Date Noted  . Nephrolithiasis 01/26/2017  . Left ureteral stone 01/03/2017  . Endometrial disorder 07/08/2015  . Bilateral occipital neuralgia 03/02/2015  . DDD (degenerative disc disease), lumbar 12/24/2014  . Facet syndrome, lumbar 12/24/2014  . Sacroiliac joint dysfunction 12/24/2014   Pura Spice, PT, DPT # [phone] 10/12/2019, 6:46 PM  [place] [facility] [address] [place], [place], [address] Phone: [phone]   Fax:  [phone]  Name: Mariah Reeves MRN: [id number] Date of Birth: 09-16-1965

## 2019-10-06 ENCOUNTER — Encounter: Payer: Self-pay | Admitting: Physical Therapy

## 2019-10-06 ENCOUNTER — Other Ambulatory Visit: Payer: Self-pay

## 2019-10-06 ENCOUNTER — Ambulatory Visit: Payer: Medicare Other | Admitting: Physical Therapy

## 2019-10-06 DIAGNOSIS — M25619 Stiffness of unspecified shoulder, not elsewhere classified: Secondary | ICD-10-CM

## 2019-10-06 DIAGNOSIS — R29898 Other symptoms and signs involving the musculoskeletal system: Secondary | ICD-10-CM

## 2019-10-06 DIAGNOSIS — M6281 Muscle weakness (generalized): Secondary | ICD-10-CM

## 2019-10-06 DIAGNOSIS — M25512 Pain in left shoulder: Secondary | ICD-10-CM

## 2019-10-06 DIAGNOSIS — Z9889 Other specified postprocedural states: Secondary | ICD-10-CM | POA: Diagnosis not present

## 2019-10-07 ENCOUNTER — Encounter: Payer: BC Managed Care – PPO | Admitting: Physical Therapy

## 2019-10-08 ENCOUNTER — Ambulatory Visit: Payer: Medicare Other | Admitting: Physical Therapy

## 2019-10-08 ENCOUNTER — Telehealth: Payer: Self-pay

## 2019-10-08 NOTE — Telephone Encounter (Signed)
LMVM to notify patient rx called to [facility].

## 2019-10-08 NOTE — Telephone Encounter (Signed)
Patient states her insurance sent her a letter that she has to change pharmacies. She is switching from [facility] to [facility] [address]. She had requested a refill of her premarin last month which was sent to [facility] and she never picked it up. She is requesting refill now be sent to [facility]. AP#[phone]

## 2019-10-08 NOTE — Telephone Encounter (Signed)
Spoke to [employer], verbal given on Premarin vaginal cream that was sent 09/08/19#30 g w/3 rf's (no confirmed receipt from [facility])

## 2019-10-08 NOTE — Telephone Encounter (Signed)
Pt has called [facility] who advised her that her Premarin rx from [facility] doesn't have any refills and would need to be sent in to [facility].

## 2019-10-08 NOTE — Telephone Encounter (Signed)
Pharmacy [facility] to [facility] as requested. Pt advised of this and to contact new pharamcy ([facility]) who will have any active rx's transferred. She will reach back out to [place] with any further questions/concerns.

## 2019-10-09 ENCOUNTER — Encounter: Payer: BC Managed Care – PPO | Admitting: Physical Therapy

## 2019-10-10 NOTE — Therapy (Addendum)
[place] [facility] [address] [place], [place], [address] Phone: [phone]   Fax:  [phone]  Physical Therapy Treatment  Patient Details  Name: Mariah Reeves MRN: [id number] Date of Birth: [birth year] Referring Provider (PT): Dr. Martha Clan   Encounter Date: 10/06/2019    PT End of Session - 10/10/19 0715    Visit Number  7    Number of Visits  9    Date for PT Re-Evaluation  10/13/19    Authorization - Visit Number  7    Authorization - Number of Visits  10    PT Start Time  0857    PT Stop Time  0947    PT Time Calculation (min)  50 min    Activity Tolerance  Patient tolerated treatment well;Patient limited by pain    Behavior During Therapy  [facility] for tasks assessed/performed         Past Medical History:  Diagnosis Date  . Allergy   . Anomaly, uterus    tumor of uterus  . Anxiety   . Bilateral occipital neuralgia   . Bipolar 1 disorder (HCC)   . Chronic back pain   . Complication of anesthesia    hard time waking up after gastric bypass.Pt stated that she has a small mouth and has had several surgeries since gastric bypass with no problems.  . Degenerative disc disease, lumbar   . Depression   . Facet syndrome, lumbar   . Family history of breast cancer    2/21 cancer genetic testing letter sent  . GERD (gastroesophageal reflux disease)    before gastric bypass  . Headache    Migraines  . History of kidney stones    Left  . Hx of vertigo   . Patella fracture    Left  . PTSD (post-traumatic stress disorder)   . Sleep apnea    C-PAP  . Wears glasses     Past Surgical History:  Procedure Laterality Date  . ABDOMINOPLASTY  2006  . CARPAL TUNNEL RELEASE Left   . CHOLECYSTECTOMY    . COLONOSCOPY W/ POLYPECTOMY    . CYSTOSCOPY W/ URETERAL STENT PLACEMENT  01/03/2017   Procedure: CYSTOSCOPY WITH RETROGRADE PYELOGRAM/URETERAL STENT PLACEMENT;  Surgeon: Hildred Laser, MD;  Location: [facility];  Service:  Urology;;  . Bluford Kaufmann W/ URETERAL STENT PLACEMENT Left 01/17/2017   Procedure: CYSTOSCOPY WITH STENT REPLACEMENT;  Surgeon: Hildred Laser, MD;  Location: [facility];  Service: Urology;  Laterality: Left;  . DILATATION & CURETTAGE/HYSTEROSCOPY WITH MYOSURE N/A 07/08/2015   Procedure: DILATATION & CURETTAGE/HYSTEROSCOPY WITH MYOSURE;  Surgeon: Nadara Mustard, MD;  Location: [facility];  Service: Gynecology;  Laterality: N/A;  . DILATION AND CURETTAGE OF UTERUS     1/17  . EXTRACORPOREAL SHOCK WAVE LITHOTRIPSY Left 12/07/2016   Procedure: EXTRACORPOREAL SHOCK WAVE LITHOTRIPSY (ESWL);  Surgeon: Hildred Laser, MD;  Location: [facility];  Service: Urology;  Laterality: Left;  Marland Kitchen GASTRIC BYPASS  2005  . KNEE SURGERY Right    ACL- repair  . LITHOTRIPSY    . MIDDLE EAR SURGERY    . NASAL SEPTUM SURGERY    . OVARY SURGERY Right   . URETEROSCOPY WITH HOLMIUM LASER LITHOTRIPSY     perforated ureter. had nephrostomy tube for brief time  . URETEROSCOPY WITH HOLMIUM LASER LITHOTRIPSY Left 01/17/2017   Procedure: URETEROSCOPY WITH HOLMIUM LASER LITHOTRIPSY;  Surgeon: Hildred Laser, MD;  Location: [facility];  Service: Urology;  Laterality:  Left;    There were no vitals filed for this visit.  Subjective Assessment - 10/13/19 1708    Subjective  Pt. states MD went well but c/o 8/10 L shoulder pain at this time.  Pt. is able to wean completely off sling.  No overhead AROM at this time.  Pt. still having difficulty sleeping at night.    Pertinent History  Pt. known well to PT.  See previous PT treatment notes.    Patient Stated Goals  Increase L shoulder ROM/ pain mgmt.  Return to driving.    Currently in Pain?  Yes    Pain Score  8     Pain Location  Shoulder    Pain Orientation  Left    Pain Descriptors / Indicators  Aching       There.ex.:  B UBE 3 min. F/b (consistent cadence). Supine/ seated wand AAROM with PT assist (all planes) as tolerated to end range 10x each. L grip strength:  26.2#  Manual tx.:  Supine L shoulder PROM all planes (as tolerated) per MD protocol 5x each STM to L anterior/ mid-deltoid/ biceps musculature 5 min.    PT Long Term Goals - 09/17/19 0743      PT LONG TERM GOAL #1   Title  Pt will score at least 64 on FOTO to demonstrate increased functional mobility.    Baseline  Initial FOTO: 40    Time  4    Period  Weeks    Status  New    Target Date  10/13/19      PT LONG TERM GOAL #2   Title  Pt will decrease worst pain as reported on NPRS by at least 3 points in order to demonstrate clinically significant reduction in pain.    Baseline  Worst pain >5/10 L shoulder    Time  4    Period  Weeks    Status  New    Target Date  10/13/19      PT LONG TERM GOAL #3   Title  Pt. independent with HEP to increase L shoulder AROM to WNL (all planes) as compared to R shoulder to improve functional mobility.    Baseline  L shoulder PROM: flexion (106 deg.), abduction (105 deg.), ER (28 deg.), IR (58 deg.).    Time  4    Period  Weeks    Status  New    Target Date  10/13/19      PT LONG TERM GOAL #4   Title  Pt. able to lift a gallon of milk/ grocery bags with no L shoulder pain/ limitations.    Baseline  No lifting with L shoulder at this time.    Time  4    Period  Weeks    Status  New    Target Date  10/13/19            Plan - 10/13/19 1710    Clinical Impression Statement  Pt. able to complete supine/ seated AAROM with use of wand.  Minimal L UT overcompensation noted and benefits from use of mirror for feedback.  L reports increase c/o shoulder pain today due to not sleeping well last night and active over the weekend.  Pt. 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    Examination-Activity Limitations  Bed Mobility;Bathing;Reach Overhead;Carry;Dressing;Sleep;Lift    Stability/Clinical Decision Making  Evolving/Moderate complexity    Clinical Decision Making  Moderate    Rehab Potential  Good  PT Frequency  2x / week    PT  Duration  4 weeks    PT Treatment/Interventions  ADLs/Self Care Home Management;Cryotherapy;Electrical Stimulation;Moist Heat;Therapeutic exercise;Therapeutic activities;Functional mobility training;Neuromuscular re-education;Patient/family education;Manual techniques;Passive range of motion;Scar mobilization    PT Next Visit Plan  Increase L shoulder AA/PROM (follow MD protocol).    PT Home Exercise Plan  [id number]    Consulted and Agree with Plan of Care  Patient       Patient will benefit from skilled therapeutic intervention in order to improve the following deficits and impairments:  Improper body mechanics, Pain, Decreased coordination, Decreased mobility, Decreased activity tolerance, Decreased endurance, Decreased range of motion, Decreased strength, Impaired UE functional use, Impaired perceived functional ability, Hypomobility, Impaired flexibility  Visit Diagnosis: S/P complete repair of rotator cuff  Acute pain of left shoulder  Limited range of motion of shoulder  Weakness of shoulder  Muscle weakness (generalized)     Problem List Patient Active Problem List   Diagnosis Date Noted  . Nephrolithiasis 01/26/2017  . Left ureteral stone 01/03/2017  . Endometrial disorder 07/08/2015  . Bilateral occipital neuralgia 03/02/2015  . DDD (degenerative disc disease), lumbar 12/24/2014  . Facet syndrome, lumbar 12/24/2014  . Sacroiliac joint dysfunction 12/24/2014   Cammie Mcgee, PT, DPT # [phone] 10/13/2019, 5:15 PM  [place] [facility] [address] [place], [place], [address] Phone: [phone]   Fax:  [phone]  Name: Mariah Reeves MRN: [id number] Date of Birth: 11/06/1965

## 2019-10-13 ENCOUNTER — Ambulatory Visit: Payer: Medicare Other | Attending: Orthopedic Surgery | Admitting: Physical Therapy

## 2019-10-13 ENCOUNTER — Encounter: Payer: Self-pay | Admitting: Physical Therapy

## 2019-10-13 ENCOUNTER — Other Ambulatory Visit: Payer: Self-pay

## 2019-10-13 DIAGNOSIS — R29898 Other symptoms and signs involving the musculoskeletal system: Secondary | ICD-10-CM | POA: Insufficient documentation

## 2019-10-13 DIAGNOSIS — M25619 Stiffness of unspecified shoulder, not elsewhere classified: Secondary | ICD-10-CM | POA: Insufficient documentation

## 2019-10-13 DIAGNOSIS — Z9889 Other specified postprocedural states: Secondary | ICD-10-CM | POA: Insufficient documentation

## 2019-10-13 DIAGNOSIS — M6281 Muscle weakness (generalized): Secondary | ICD-10-CM | POA: Diagnosis present

## 2019-10-13 DIAGNOSIS — M25512 Pain in left shoulder: Secondary | ICD-10-CM | POA: Insufficient documentation

## 2019-10-13 NOTE — Therapy (Signed)
[facility] [address] [place], [place], [address] Phone: [phone]   Fax:  [phone]  Physical Therapy Treatment  Patient Details  Name: Mariah Reeves MRN: [id number] Date of Birth: [birth year] Referring Provider (PT): Dr. Martha Clan   Encounter Date: 10/13/2019  PT End of Session - 10/13/19 1723    Visit Number  8    Number of Visits  9    Date for PT Re-Evaluation  10/13/19    Authorization - Visit Number  8    Authorization - Number of Visits  10    PT Start Time  0903    PT Stop Time  0954    PT Time Calculation (min)  51 min    Activity Tolerance  Patient tolerated treatment well;Patient limited by pain    Behavior During Therapy  [facility] for tasks assessed/performed       Past Medical History:  Diagnosis Date  . Allergy   . Anomaly, uterus    tumor of uterus  . Anxiety   . Bilateral occipital neuralgia   . Bipolar 1 disorder (HCC)   . Chronic back pain   . Complication of anesthesia    hard time waking up after gastric bypass.Pt stated that she has a small mouth and has had several surgeries since gastric bypass with no problems.  . Degenerative disc disease, lumbar   . Depression   . Facet syndrome, lumbar   . Family history of breast cancer    2/21 cancer genetic testing letter sent  . GERD (gastroesophageal reflux disease)    before gastric bypass  . Headache    Migraines  . History of kidney stones    Left  . Hx of vertigo   . Patella fracture    Left  . PTSD (post-traumatic stress disorder)   . Sleep apnea    C-PAP  . Wears glasses     Past Surgical History:  Procedure Laterality Date  . ABDOMINOPLASTY  2006  . CARPAL TUNNEL RELEASE Left   . CHOLECYSTECTOMY    . COLONOSCOPY W/ POLYPECTOMY    . CYSTOSCOPY W/ URETERAL STENT PLACEMENT  01/03/2017   Procedure: CYSTOSCOPY WITH RETROGRADE PYELOGRAM/URETERAL STENT PLACEMENT;  Surgeon: Hildred Laser, MD;  Location: [facility];  Service: Urology;;   . Bluford Kaufmann W/ URETERAL STENT PLACEMENT Left 01/17/2017   Procedure: CYSTOSCOPY WITH STENT REPLACEMENT;  Surgeon: Hildred Laser, MD;  Location: [facility];  Service: Urology;  Laterality: Left;  . DILATATION & CURETTAGE/HYSTEROSCOPY WITH MYOSURE N/A 07/08/2015   Procedure: DILATATION & CURETTAGE/HYSTEROSCOPY WITH MYOSURE;  Surgeon: Nadara Mustard, MD;  Location: [facility];  Service: Gynecology;  Laterality: N/A;  . DILATION AND CURETTAGE OF UTERUS     1/17  . EXTRACORPOREAL SHOCK WAVE LITHOTRIPSY Left 12/07/2016   Procedure: EXTRACORPOREAL SHOCK WAVE LITHOTRIPSY (ESWL);  Surgeon: Hildred Laser, MD;  Location: [facility];  Service: Urology;  Laterality: Left;  Marland Kitchen GASTRIC BYPASS  2005  . KNEE SURGERY Right    ACL- repair  . LITHOTRIPSY    . MIDDLE EAR SURGERY    . NASAL SEPTUM SURGERY    . OVARY SURGERY Right   . URETEROSCOPY WITH HOLMIUM LASER LITHOTRIPSY     perforated ureter. had nephrostomy tube for brief time  . URETEROSCOPY WITH HOLMIUM LASER LITHOTRIPSY Left 01/17/2017   Procedure: URETEROSCOPY WITH HOLMIUM LASER LITHOTRIPSY;  Surgeon: Hildred Laser, MD;  Location: [facility];  Service: Urology;  Laterality: Left;  There were no vitals filed for this visit.  Subjective Assessment - 10/13/19 1721    Subjective  Pt. reports 2/10 L shoulder pain currently at rest prior to tx. session.  Pt. states she is using L UE more with daily activities but no overhead reaching.    Pertinent History  Pt. known well to PT.  See previous PT treatment notes.    Patient Stated Goals  Increase L shoulder ROM/ pain mgmt.  Return to driving.    Currently in Pain?  Yes    Pain Score  2     Pain Location  Shoulder    Pain Orientation  Left    Pain Descriptors / Indicators  Aching    Pain Type  Surgical pain         There.ex.:  B UBE 3 min. F/b (consistent cadence/ no increase pain) Seated wand AAROM: sh. Flexion/ ER/ chest press/ standing shoulder extension/ IR 20x each (mirror  feedback). Supine L shoulder AAROM: serratus punches/ horizontal abduction/ adduction with PT assist 20x.   Reviewed HEP  Manual tx.:  Supine L shoulder AAROM all planes of movement in pain tolerable range/ static holds 10x each direction. STM to L UT/ deltoid/ biceps/ triceps musculature 7 min.        PT Long Term Goals - 09/17/19 0743      PT LONG TERM GOAL #1   Title  Pt will score at least 64 on FOTO to demonstrate increased functional mobility.    Baseline  Initial FOTO: 40    Time  4    Period  Weeks    Status  New    Target Date  10/13/19      PT LONG TERM GOAL #2   Title  Pt will decrease worst pain as reported on NPRS by at least 3 points in order to demonstrate clinically significant reduction in pain.    Baseline  Worst pain >5/10 L shoulder    Time  4    Period  Weeks    Status  New    Target Date  10/13/19      PT LONG TERM GOAL #3   Title  Pt. independent with HEP to increase L shoulder AROM to WNL (all planes) as compared to R shoulder to improve functional mobility.    Baseline  L shoulder PROM: flexion (106 deg.), abduction (105 deg.), ER (28 deg.), IR (58 deg.).    Time  4    Period  Weeks    Status  New    Target Date  10/13/19      PT LONG TERM GOAL #4   Title  Pt. able to lift a gallon of milk/ grocery bags with no L shoulder pain/ limitations.    Baseline  No lifting with L shoulder at this time.    Time  4    Period  Weeks    Status  New    Target Date  10/13/19            Plan - 10/13/19 1724    Clinical Impression Statement  No increase c/o L shoulder pain during UBE or seated wand ex.  Pt. has generalized L deltoid to mid-bicep tenderness with palpation.  Minimal muscle tightness noted and no trigger points.  Pt. demonstrates no L UT muscle compensation during seated wand AAROM during press-ups/ shoulder flexion.  Pt. encouraged to avoid any overhead reaching/ pain provoking movement patterns.    Examination-Activity Limitations  Bed  Mobility;Bathing;Reach  Overhead;Carry;Dressing;Sleep;Lift    Stability/Clinical Decision Making  Evolving/Moderate complexity    Clinical Decision Making  Moderate    Rehab Potential  Good    PT Frequency  2x / week    PT Duration  4 weeks    PT Treatment/Interventions  ADLs/Self Care Home Management;Cryotherapy;Electrical Stimulation;Moist Heat;Therapeutic exercise;Therapeutic activities;Functional mobility training;Neuromuscular re-education;Patient/family education;Manual techniques;Passive range of motion;Scar mobilization    PT Next Visit Plan  Increase L shoulder AA/PROM (follow MD protocol).   CHECK GOALS/ RECERT NEXT TX SESSION.    PT Home Exercise Plan  [id number]    Consulted and Agree with Plan of Care  Patient       Patient will benefit from skilled therapeutic intervention in order to improve the following deficits and impairments:  Improper body mechanics, Pain, Decreased coordination, Decreased mobility, Decreased activity tolerance, Decreased endurance, Decreased range of motion, Decreased strength, Impaired UE functional use, Impaired perceived functional ability, Hypomobility, Impaired flexibility  Visit Diagnosis: S/P complete repair of rotator cuff  Acute pain of left shoulder  Limited range of motion of shoulder  Weakness of shoulder  Muscle weakness (generalized)     Problem List Patient Active Problem List   Diagnosis Date Noted  . Nephrolithiasis 01/26/2017  . Left ureteral stone 01/03/2017  . Endometrial disorder 07/08/2015  . Bilateral occipital neuralgia 03/02/2015  . DDD (degenerative disc disease), lumbar 12/24/2014  . Facet syndrome, lumbar 12/24/2014  . Sacroiliac joint dysfunction 12/24/2014   Pura Spice, PT, DPT # [phone] 10/13/2019, 5:28 PM  [facility] [facility] [address] [place], [place], [address] Phone: [phone]   Fax:  [phone]  Name: Mariah Reeves MRN: [id number] Date of Birth:  04-25-1966

## 2019-10-14 ENCOUNTER — Encounter: Payer: BC Managed Care – PPO | Admitting: Physical Therapy

## 2019-10-15 ENCOUNTER — Ambulatory Visit: Payer: Medicare Other | Admitting: Physical Therapy

## 2019-10-15 ENCOUNTER — Encounter: Payer: Self-pay | Admitting: Physical Therapy

## 2019-10-15 ENCOUNTER — Other Ambulatory Visit: Payer: Self-pay

## 2019-10-15 DIAGNOSIS — M25512 Pain in left shoulder: Secondary | ICD-10-CM

## 2019-10-15 DIAGNOSIS — Z9889 Other specified postprocedural states: Secondary | ICD-10-CM

## 2019-10-15 DIAGNOSIS — M25619 Stiffness of unspecified shoulder, not elsewhere classified: Secondary | ICD-10-CM

## 2019-10-15 DIAGNOSIS — M6281 Muscle weakness (generalized): Secondary | ICD-10-CM

## 2019-10-15 DIAGNOSIS — R29898 Other symptoms and signs involving the musculoskeletal system: Secondary | ICD-10-CM

## 2019-10-15 NOTE — Therapy (Signed)
[facility] [address] [place], [place], [address] Phone: [phone]   Fax:  [phone]  Physical Therapy Treatment  Patient Details  Name: Mariah Reeves MRN: [id number] Date of Birth: [birth year] Referring Provider (PT): Dr. Mack Guise   Encounter Date: 10/15/2019  PT End of Session - 10/15/19 0918    Visit Number  9    Number of Visits  17    Date for PT Re-Evaluation  11/12/19    Authorization - Visit Number  1    Authorization - Number of Visits  10    PT Start Time  0911    PT Stop Time  1004    PT Time Calculation (min)  53 min    Activity Tolerance  Patient tolerated treatment well;Patient limited by pain    Behavior During Therapy  [facility] for tasks assessed/performed       Past Medical History:  Diagnosis Date  . Allergy   . Anomaly, uterus    tumor of uterus  . Anxiety   . Bilateral occipital neuralgia   . Bipolar 1 disorder ([place])   . Chronic back pain   . Complication of anesthesia    hard time waking up after gastric bypass.Pt stated that she has a small mouth and has had several surgeries since gastric bypass with no problems.  . Degenerative disc disease, lumbar   . Depression   . Facet syndrome, lumbar   . Family history of breast cancer    2/21 cancer genetic testing letter sent  . GERD (gastroesophageal reflux disease)    before gastric bypass  . Headache    Migraines  . History of kidney stones    Left  . Hx of vertigo   . Patella fracture    Left  . PTSD (post-traumatic stress disorder)   . Sleep apnea    C-PAP  . Wears glasses     Past Surgical History:  Procedure Laterality Date  . ABDOMINOPLASTY  2006  . CARPAL TUNNEL RELEASE Left   . CHOLECYSTECTOMY    . COLONOSCOPY W/ POLYPECTOMY    . CYSTOSCOPY W/ URETERAL STENT PLACEMENT  01/03/2017   Procedure: CYSTOSCOPY WITH RETROGRADE PYELOGRAM/URETERAL STENT PLACEMENT;  Surgeon: Nickie Retort, MD;  Location: [facility];  Service: Urology;;   . Consuela Mimes W/ URETERAL STENT PLACEMENT Left 01/17/2017   Procedure: CYSTOSCOPY WITH STENT REPLACEMENT;  Surgeon: Nickie Retort, MD;  Location: [facility];  Service: Urology;  Laterality: Left;  . DILATATION & CURETTAGE/HYSTEROSCOPY WITH MYOSURE N/A 07/08/2015   Procedure: DILATATION & CURETTAGE/HYSTEROSCOPY WITH MYOSURE;  Surgeon: Gae Dry, MD;  Location: [facility];  Service: Gynecology;  Laterality: N/A;  . DILATION AND CURETTAGE OF UTERUS     1/17  . EXTRACORPOREAL SHOCK WAVE LITHOTRIPSY Left 12/07/2016   Procedure: EXTRACORPOREAL SHOCK WAVE LITHOTRIPSY (ESWL);  Surgeon: Nickie Retort, MD;  Location: [facility];  Service: Urology;  Laterality: Left;  Marland Kitchen GASTRIC BYPASS  2005  . KNEE SURGERY Right    ACL- repair  . LITHOTRIPSY    . MIDDLE EAR SURGERY    . NASAL SEPTUM SURGERY    . OVARY SURGERY Right   . URETEROSCOPY WITH HOLMIUM LASER LITHOTRIPSY     perforated ureter. had nephrostomy tube for brief time  . URETEROSCOPY WITH HOLMIUM LASER LITHOTRIPSY Left 01/17/2017   Procedure: URETEROSCOPY WITH HOLMIUM LASER LITHOTRIPSY;  Surgeon: Nickie Retort, MD;  Location: [facility];  Service: Urology;  Laterality: Left;  There were no vitals filed for this visit.  Subjective Assessment - 10/15/19 0917    Subjective  Pt. reports a slight increase in L shoulder pain during shower this morning when raising L sh. to clean under arm.  Pt. reports compliance with HEP.    Pertinent History  Pt. known well to PT.  See previous PT treatment notes.    Patient Stated Goals  Increase L shoulder ROM/ pain mgmt.  Return to driving.    Currently in Pain?  Yes    Pain Score  4     Pain Location  Shoulder    Pain Orientation  Left    Pain Descriptors / Indicators  Aching         [facility] PT Assessment - 10/15/19 0001      Assessment   Medical Diagnosis  S/p L RTC repair/ Joint stiffness    Referring Provider (PT)  Dr. Mack Guise    Onset Date/Surgical Date  09/02/19    Hand Dominance   Right      Precautions   Precautions  Shoulder      [place] residence      Prior Function   Level of Independence  Independent      Cognition   Overall Cognitive Status  Within Functional Limits for tasks assessed       There.ex.:  B UBE 3 min. F/b (consistent cadence/ no increase pain)  Seated wand AAROM: sh. Flexion/ ER/ chest press 20x each (mirror feedback). Supine L shoulder AAROM: serratus punches/ horizontal abduction/ adduction with PT assist 20x.   Standing wall ladder/ shoulder flexion with ball.  See updated new HEP  Manual tx.:  Supine L shoulder AAROM all planes of movement in pain tolerable range/ static holds 10x each direction. STM to L UT/ deltoid/ biceps/ triceps musculature 7 min.       PT Long Term Goals - 10/15/19 0933      PT LONG TERM GOAL #1   Title  Pt will score at least 64 on FOTO to demonstrate increased functional mobility.    Baseline  Initial FOTO: 40.  5/5: 41 (minimal changes).    Time  4    Period  Weeks    Status  Not Met    Target Date  11/12/19      PT LONG TERM GOAL #2   Title  Pt will decrease worst pain as reported on NPRS by at least 3 points in order to demonstrate clinically significant reduction in pain.    Baseline  Worst pain >5/10 L shoulder.  5/5: 4/10 L shoulder pain at worst    Time  4    Period  Weeks    Status  Partially Met    Target Date  11/12/19      PT LONG TERM GOAL #3   Title  Pt. independent with HEP to increase L shoulder AROM to WNL (all planes) as compared to R shoulder to improve functional mobility.    Baseline  L shoulder PROM: flexion (106 deg.), abduction (105 deg.), ER (28 deg.), IR (58 deg.).  10/15/19:  flexion (122 deg.), abduction (111 deg.), ER (60 deg.).    Time  4    Period  Weeks    Status  Partially Met    Target Date  11/12/19      PT LONG TERM GOAL #4   Title  Pt. able to lift a gallon of milk/ grocery bags with  no L shoulder pain/  limitations.    Baseline  No lifting with L shoulder at this time.    Time  4    Period  Weeks    Status  On-going    Target Date  11/12/19            Plan - 10/15/19 0919    Clinical Impression Statement  Pt. ambulates with improved L arm swing but remains limited with daily ADL.  Limited progress noted on FOTO: 42 today.  Seated L shoulder AROM (pain-free): flexion (122 deg.), abduction (111 deg.), ER (60 deg.).  Pt. progressing from AAROM to AROM in controlled setting (supine/seated posture).  No strengthenig ex. at this time with focus on increasing L shoulder ROM.  Minimal tenderness in L shoulder/ biceps with palpation/ STM.  Pt. will continue to progress with skilled PT services to increase L sh. ROM/ strengthening over next month.    Examination-Activity Limitations  Bed Mobility;Bathing;Reach Overhead;Carry;Dressing;Sleep;Lift    Stability/Clinical Decision Making  Evolving/Moderate complexity    Clinical Decision Making  Moderate    Rehab Potential  Good    PT Frequency  2x / week    PT Duration  4 weeks    PT Treatment/Interventions  ADLs/Self Care Home Management;Cryotherapy;Electrical Stimulation;Moist Heat;Therapeutic exercise;Therapeutic activities;Functional mobility training;Neuromuscular re-education;Patient/family education;Manual techniques;Passive range of motion;Scar mobilization    PT Next Visit Plan  Increase L shoulder AA/AROM (follow MD protocol).    PT Home Exercise Plan  [id number]    Consulted and Agree with Plan of Care  Patient       Patient will benefit from skilled therapeutic intervention in order to improve the following deficits and impairments:  Improper body mechanics, Pain, Decreased coordination, Decreased mobility, Decreased activity tolerance, Decreased endurance, Decreased range of motion, Decreased strength, Impaired UE functional use, Impaired perceived functional ability, Hypomobility, Impaired flexibility  Visit Diagnosis: S/P complete  repair of rotator cuff  Acute pain of left shoulder  Limited range of motion of shoulder  Weakness of shoulder  Muscle weakness (generalized)     Problem List Patient Active Problem List   Diagnosis Date Noted  . Nephrolithiasis 01/26/2017  . Left ureteral stone 01/03/2017  . Endometrial disorder 07/08/2015  . Bilateral occipital neuralgia 03/02/2015  . DDD (degenerative disc disease), lumbar 12/24/2014  . Facet syndrome, lumbar 12/24/2014  . Sacroiliac joint dysfunction 12/24/2014   Pura Spice, PT, DPT # [phone] 10/15/2019, 8:04 PM  [place] [facility] [address] [place], [place], [address] Phone: [phone]   Fax:  [phone]  Name: Mariah Reeves MRN: [id number] Date of Birth: 08/16/1965

## 2019-10-15 NOTE — Patient Instructions (Signed)
Access Code: [email or web address]: 04/26/2021Prepared by: Faylene Kurtz  Circular Shoulder Pendulum with Table Support - 2 x daily - 7 x weekly - 1 sets - 10 reps  Seated Shoulder Flexion AAROM with Pulley Behind - 2 x daily - 7 x weekly - 1 sets - 20 reps  Seated Shoulder Abduction AAROM with Pulley Behind - 2 x daily - 7 x weekly - 1 sets - 20 reps  Seated Shoulder External Rotation AAROM with Cane and Hand in Neutral - 1 x daily - 7 x weekly - 2 sets - 10 reps  Seated Shoulder Flexion AAROM with Dowel - 1 x daily - 7 x weekly - 2 sets - 10 reps  Seated Shoulder Abduction AAROM with Dowel - 1 x daily - 7 x weekly - 2 sets - 10 reps  Standing Shoulder Flexion Wall Walk - 1 x daily - 7 x weekly - 2 sets - 10 reps

## 2019-10-16 ENCOUNTER — Encounter: Payer: BC Managed Care – PPO | Admitting: Physical Therapy

## 2019-10-20 ENCOUNTER — Encounter: Payer: BC Managed Care – PPO | Admitting: Physical Therapy

## 2019-10-21 ENCOUNTER — Encounter: Payer: BC Managed Care – PPO | Admitting: Physical Therapy

## 2019-10-22 ENCOUNTER — Ambulatory Visit: Payer: Medicare Other | Admitting: Physical Therapy

## 2019-10-22 ENCOUNTER — Other Ambulatory Visit: Payer: Self-pay

## 2019-10-22 ENCOUNTER — Encounter: Payer: Self-pay | Admitting: Physical Therapy

## 2019-10-22 DIAGNOSIS — M25619 Stiffness of unspecified shoulder, not elsewhere classified: Secondary | ICD-10-CM

## 2019-10-22 DIAGNOSIS — R29898 Other symptoms and signs involving the musculoskeletal system: Secondary | ICD-10-CM

## 2019-10-22 DIAGNOSIS — M25512 Pain in left shoulder: Secondary | ICD-10-CM

## 2019-10-22 DIAGNOSIS — Z9889 Other specified postprocedural states: Secondary | ICD-10-CM | POA: Diagnosis not present

## 2019-10-22 DIAGNOSIS — M6281 Muscle weakness (generalized): Secondary | ICD-10-CM

## 2019-10-22 NOTE — Therapy (Signed)
[facility] [address]. [place], [place], [address] Phone: [phone]   Fax:  [phone]  Physical Therapy Treatment  Patient Details  Name: Mariah Reeves MRN: [id number] Date of Birth: 1966-05-21 Referring Provider (PT): Dr. Mack Guise   Encounter Date: 10/22/2019  PT End of Session - 10/22/19 0917    Visit Number  10    Number of Visits  17    Date for PT Re-Evaluation  11/12/19    Authorization - Visit Number  2    Authorization - Number of Visits  10    PT Start Time  0913    PT Stop Time  1001    PT Time Calculation (min)  48 min    Activity Tolerance  Patient tolerated treatment well;Patient limited by pain    Behavior During Therapy  [facility] for tasks assessed/performed       Past Medical History:  Diagnosis Date  . Allergy   . Anomaly, uterus    tumor of uterus  . Anxiety   . Bilateral occipital neuralgia   . Bipolar 1 disorder ([place])   . Chronic back pain   . Complication of anesthesia    hard time waking up after gastric bypass.Pt stated that she has a small mouth and has had several surgeries since gastric bypass with no problems.  . Degenerative disc disease, lumbar   . Depression   . Facet syndrome, lumbar   . Family history of breast cancer    2/21 cancer genetic testing letter sent  . GERD (gastroesophageal reflux disease)    before gastric bypass  . Headache    Migraines  . History of kidney stones    Left  . Hx of vertigo   . Patella fracture    Left  . PTSD (post-traumatic stress disorder)   . Sleep apnea    C-PAP  . Wears glasses     Past Surgical History:  Procedure Laterality Date  . ABDOMINOPLASTY  2006  . CARPAL TUNNEL RELEASE Left   . CHOLECYSTECTOMY    . COLONOSCOPY W/ POLYPECTOMY    . CYSTOSCOPY W/ URETERAL STENT PLACEMENT  01/03/2017   Procedure: CYSTOSCOPY WITH RETROGRADE PYELOGRAM/URETERAL STENT PLACEMENT;  Surgeon: Nickie Retort, MD;  Location: [facility];  Service: Urology;;   . Consuela Mimes W/ URETERAL STENT PLACEMENT Left 01/17/2017   Procedure: CYSTOSCOPY WITH STENT REPLACEMENT;  Surgeon: Nickie Retort, MD;  Location: [facility];  Service: Urology;  Laterality: Left;  . DILATATION & CURETTAGE/HYSTEROSCOPY WITH MYOSURE N/A 07/08/2015   Procedure: DILATATION & CURETTAGE/HYSTEROSCOPY WITH MYOSURE;  Surgeon: Gae Dry, MD;  Location: [facility];  Service: Gynecology;  Laterality: N/A;  . DILATION AND CURETTAGE OF UTERUS     1/17  . EXTRACORPOREAL SHOCK WAVE LITHOTRIPSY Left 12/07/2016   Procedure: EXTRACORPOREAL SHOCK WAVE LITHOTRIPSY (ESWL);  Surgeon: Nickie Retort, MD;  Location: [facility];  Service: Urology;  Laterality: Left;  Marland Kitchen GASTRIC BYPASS  2005  . KNEE SURGERY Right    ACL- repair  . LITHOTRIPSY    . MIDDLE EAR SURGERY    . NASAL SEPTUM SURGERY    . OVARY SURGERY Right   . URETEROSCOPY WITH HOLMIUM LASER LITHOTRIPSY     perforated ureter. had nephrostomy tube for brief time  . URETEROSCOPY WITH HOLMIUM LASER LITHOTRIPSY Left 01/17/2017   Procedure: URETEROSCOPY WITH HOLMIUM LASER LITHOTRIPSY;  Surgeon: Nickie Retort, MD;  Location: [facility];  Service: Urology;  Laterality: Left;  There were no vitals filed for this visit.  Subjective Assessment - 10/22/19 0918    Subjective  Pt. states she feels stiff in L shoulder this morning.  Pt. reports sleeping is still difficult in L shoulder when rolling over on L side.  Pt. primarily sleeps on R side but occaionally rolls onto L side.    Pertinent History  Pt. known well to PT.  See previous PT treatment notes.    Patient Stated Goals  Increase L shoulder ROM/ pain mgmt.  Return to driving.    Currently in Pain?  Yes    Pain Score  7     Pain Location  Shoulder    Pain Orientation  Left    Pain Descriptors / Indicators  Aching         There.ex.:  B UBE 3 min. F/b (consistent cadence/ no increase pain) Standing wand AAROM: sh. Flexion/ ER/ abduction/ chest press 20x each (mirror  feedback). Supine A/AROM (all planes)- 10x each. Supine L shoulder AAROM: serratus punches/ horizontal abduction/ adduction with PT assist 20x.  Supine L shoulder A/AROM all planes of movement in pain tolerable range/ static holds 10x each direction.  STM to L UT/ deltoid/ biceps/ triceps musculature 5 min.      PT Long Term Goals - 10/15/19 0933      PT LONG TERM GOAL #1   Title  Pt will score at least 64 on FOTO to demonstrate increased functional mobility.    Baseline  Initial FOTO: 40.  5/5: 41 (minimal changes).    Time  4    Period  Weeks    Status  Not Met    Target Date  11/12/19      PT LONG TERM GOAL #2   Title  Pt will decrease worst pain as reported on NPRS by at least 3 points in order to demonstrate clinically significant reduction in pain.    Baseline  Worst pain >5/10 L shoulder.  5/5: 4/10 L shoulder pain at worst    Time  4    Period  Weeks    Status  Partially Met    Target Date  11/12/19      PT LONG TERM GOAL #3   Title  Pt. independent with HEP to increase L shoulder AROM to WNL (all planes) as compared to R shoulder to improve functional mobility.    Baseline  L shoulder PROM: flexion (106 deg.), abduction (105 deg.), ER (28 deg.), IR (58 deg.).  10/15/19:  flexion (122 deg.), abduction (111 deg.), ER (60 deg.).    Time  4    Period  Weeks    Status  Partially Met    Target Date  11/12/19      PT LONG TERM GOAL #4   Title  Pt. able to lift a gallon of milk/ grocery bags with no L shoulder pain/ limitations.    Baseline  No lifting with L shoulder at this time.    Time  4    Period  Weeks    Status  On-going    Target Date  11/12/19          Plan - 10/22/19 0917    Clinical Impression Statement  Pt. progressing well with L shoulder AROM in seated/ standing posture.  PT instructed pt. during ROM ex. to prevent any compensatory movement patterns (mirror feedback).  Pt. motivated to return to [employer] driving over the next couple weeks with no L  shoulder limitations.  PT planning to issue isometric ex. program next tx. session.    Examination-Activity Limitations  Bed Mobility;Bathing;Reach Overhead;Carry;Dressing;Sleep;Lift    Stability/Clinical Decision Making  Evolving/Moderate complexity    Clinical Decision Making  Moderate    Rehab Potential  Good    PT Frequency  2x / week    PT Duration  4 weeks    PT Treatment/Interventions  ADLs/Self Care Home Management;Cryotherapy;Electrical Stimulation;Moist Heat;Therapeutic exercise;Therapeutic activities;Functional mobility training;Neuromuscular re-education;Patient/family education;Manual techniques;Passive range of motion;Scar mobilization    PT Next Visit Plan  Increase L shoulder AA/AROM (follow MD protocol).  ADD ISOMETRIC EX. NEXT VISIT.    PT Home Exercise Plan  [email or web address]    Consulted and Agree with Plan of Care  Patient       Patient will benefit from skilled therapeutic intervention in order to improve the following deficits and impairments:  Improper body mechanics, Pain, Decreased coordination, Decreased mobility, Decreased activity tolerance, Decreased endurance, Decreased range of motion, Decreased strength, Impaired UE functional use, Impaired perceived functional ability, Hypomobility, Impaired flexibility  Visit Diagnosis: S/P complete repair of rotator cuff  Acute pain of left shoulder  Limited range of motion of shoulder  Weakness of shoulder  Muscle weakness (generalized)     Problem List Patient Active Problem List   Diagnosis Date Noted  . Nephrolithiasis 01/26/2017  . Left ureteral stone 01/03/2017  . Endometrial disorder 07/08/2015  . Bilateral occipital neuralgia 03/02/2015  . DDD (degenerative disc disease), lumbar 12/24/2014  . Facet syndrome, lumbar 12/24/2014  . Sacroiliac joint dysfunction 12/24/2014   Pura Spice, PT, DPT # [phone] 10/22/2019, 12:35 PM  [facility] [address] [place], [place], [address] Phone: [phone]   Fax:  [phone]  Name: Mariah Reeves MRN: [id number] Date of Birth: 1966-04-02

## 2019-10-23 ENCOUNTER — Encounter: Payer: BC Managed Care – PPO | Admitting: Physical Therapy

## 2019-10-28 ENCOUNTER — Other Ambulatory Visit: Payer: Self-pay

## 2019-10-28 ENCOUNTER — Encounter: Payer: Self-pay | Admitting: Physical Therapy

## 2019-10-28 ENCOUNTER — Ambulatory Visit: Payer: Medicare Other | Admitting: Physical Therapy

## 2019-10-28 DIAGNOSIS — R29898 Other symptoms and signs involving the musculoskeletal system: Secondary | ICD-10-CM

## 2019-10-28 DIAGNOSIS — M25512 Pain in left shoulder: Secondary | ICD-10-CM

## 2019-10-28 DIAGNOSIS — M25619 Stiffness of unspecified shoulder, not elsewhere classified: Secondary | ICD-10-CM

## 2019-10-28 DIAGNOSIS — Z9889 Other specified postprocedural states: Secondary | ICD-10-CM | POA: Diagnosis not present

## 2019-10-28 DIAGNOSIS — M6281 Muscle weakness (generalized): Secondary | ICD-10-CM

## 2019-10-28 NOTE — Patient Instructions (Signed)
Access Code: [email or web address]: [email or web address]: 04/26/2021Prepared by: Casimiro Needle SherkExercises  Circular Shoulder Pendulum with Table Support - 2 x daily - 7 x weekly - 1 sets - 10 reps  Seated Shoulder Flexion AAROM with Pulley Behind - 2 x daily - 7 x weekly - 1 sets - 20 reps  Seated Shoulder Abduction AAROM with Pulley Behind - 2 x daily - 7 x weekly - 1 sets - 20 reps  Seated Shoulder External Rotation AAROM with Cane and Hand in Neutral - 1 x daily - 7 x weekly - 2 sets - 10 reps  Seated Shoulder Flexion AAROM with Dowel - 1 x daily - 7 x weekly - 2 sets - 10 reps  Seated Shoulder Abduction AAROM with Dowel - 1 x daily - 7 x weekly - 2 sets - 10 reps  Standing Shoulder Flexion Wall Walk - 1 x daily - 7 x weekly - 2 sets - 10 reps  Isometric Shoulder Flexion at Wall - 1 x daily - 4 x weekly - 1 sets - 10 reps - 10 seconds hold  Isometric Shoulder Abduction at Wall - 1 x daily - 4 x weekly - 1 sets - 10 reps - 10 seconds hold  Isometric Shoulder External Rotation at Wall - 1 x daily - 4 x weekly - 1 sets - 10 reps - 10 seconds hold

## 2019-10-28 NOTE — Therapy (Signed)
[facility] [address] [place], [place], [address] Phone: [phone]   Fax:  [phone]  Physical Therapy Treatment  Patient Details  Name: Mariah Reeves MRN: [id number] Date of Birth: Jun 24, 1965 Referring Provider (PT): Dr. Mack Guise   Encounter Date: 10/28/2019  PT End of Session - 10/28/19 0926    Visit Number  11    Number of Visits  17    Date for PT Re-Evaluation  11/12/19    Authorization - Visit Number  3    Authorization - Number of Visits  10    PT Start Time  0926    PT Stop Time  1023    PT Time Calculation (min)  57 min    Activity Tolerance  Patient tolerated treatment well;Patient limited by pain    Behavior During Therapy  [facility] for tasks assessed/performed       Past Medical History:  Diagnosis Date  . Allergy   . Anomaly, uterus    tumor of uterus  . Anxiety   . Bilateral occipital neuralgia   . Bipolar 1 disorder ([place])   . Chronic back pain   . Complication of anesthesia    hard time waking up after gastric bypass.Pt stated that she has a small mouth and has had several surgeries since gastric bypass with no problems.  . Degenerative disc disease, lumbar   . Depression   . Facet syndrome, lumbar   . Family history of breast cancer    2/21 cancer genetic testing letter sent  . GERD (gastroesophageal reflux disease)    before gastric bypass  . Headache    Migraines  . History of kidney stones    Left  . Hx of vertigo   . Patella fracture    Left  . PTSD (post-traumatic stress disorder)   . Sleep apnea    C-PAP  . Wears glasses     Past Surgical History:  Procedure Laterality Date  . ABDOMINOPLASTY  2006  . CARPAL TUNNEL RELEASE Left   . CHOLECYSTECTOMY    . COLONOSCOPY W/ POLYPECTOMY    . CYSTOSCOPY W/ URETERAL STENT PLACEMENT  01/03/2017   Procedure: CYSTOSCOPY WITH RETROGRADE PYELOGRAM/URETERAL STENT PLACEMENT;  Surgeon: Nickie Retort, MD;  Location: [facility];  Service: Urology;;   . Consuela Mimes W/ URETERAL STENT PLACEMENT Left 01/17/2017   Procedure: CYSTOSCOPY WITH STENT REPLACEMENT;  Surgeon: Nickie Retort, MD;  Location: [facility];  Service: Urology;  Laterality: Left;  . DILATATION & CURETTAGE/HYSTEROSCOPY WITH MYOSURE N/A 07/08/2015   Procedure: DILATATION & CURETTAGE/HYSTEROSCOPY WITH MYOSURE;  Surgeon: Gae Dry, MD;  Location: [facility];  Service: Gynecology;  Laterality: N/A;  . DILATION AND CURETTAGE OF UTERUS     1/17  . EXTRACORPOREAL SHOCK WAVE LITHOTRIPSY Left 12/07/2016   Procedure: EXTRACORPOREAL SHOCK WAVE LITHOTRIPSY (ESWL);  Surgeon: Nickie Retort, MD;  Location: [facility];  Service: Urology;  Laterality: Left;  Marland Kitchen GASTRIC BYPASS  2005  . KNEE SURGERY Right    ACL- repair  . LITHOTRIPSY    . MIDDLE EAR SURGERY    . NASAL SEPTUM SURGERY    . OVARY SURGERY Right   . URETEROSCOPY WITH HOLMIUM LASER LITHOTRIPSY     perforated ureter. had nephrostomy tube for brief time  . URETEROSCOPY WITH HOLMIUM LASER LITHOTRIPSY Left 01/17/2017   Procedure: URETEROSCOPY WITH HOLMIUM LASER LITHOTRIPSY;  Surgeon: Nickie Retort, MD;  Location: [facility];  Service: Urology;  Laterality: Left;  There were no vitals filed for this visit.  Subjective Assessment - 10/28/19 0926    Subjective  Pt. states she was lazy with HEP this past weekend.  Pt. reports 4/10 L shoulder pain prior to PT tx. session.    Pertinent History  Pt. known well to PT.  See previous PT treatment notes.    Patient Stated Goals  Increase L shoulder ROM/ pain mgmt.  Return to driving.    Currently in Pain?  Yes    Pain Score  4     Pain Location  Shoulder    Pain Orientation  Left    Pain Descriptors / Indicators  Aching        Standing L shoulder AROM: flexion (137 deg.), abduction (115 deg.).    Seated L shoulder AROM: IR (70 deg.), ER (61 deg.).     There.ex.:  B UBE 3 min. F/b (consistent cadence/ no increase pain)  Standing/ supine L shoulder AROM (all planes)-  mirror feedback. Supine L shoulder A/AROM: serratus punches/ flexion/ horizontal abduction and adduction with PT assist 20x. Supine manual isometrics (all planes)- 3x each with holds.   See new HEP (doorway isometrics)- 10x 10 sec.    STM to L UT/ deltoid/ biceps/ triceps musculature 5 min.     PT Long Term Goals - 10/15/19 0933      PT LONG TERM GOAL #1   Title  Pt will score at least 64 on FOTO to demonstrate increased functional mobility.    Baseline  Initial FOTO: 40.  5/5: 41 (minimal changes).    Time  4    Period  Weeks    Status  Not Met    Target Date  11/12/19      PT LONG TERM GOAL #2   Title  Pt will decrease worst pain as reported on NPRS by at least 3 points in order to demonstrate clinically significant reduction in pain.    Baseline  Worst pain >5/10 L shoulder.  5/5: 4/10 L shoulder pain at worst    Time  4    Period  Weeks    Status  Partially Met    Target Date  11/12/19      PT LONG TERM GOAL #3   Title  Pt. independent with HEP to increase L shoulder AROM to WNL (all planes) as compared to R shoulder to improve functional mobility.    Baseline  L shoulder PROM: flexion (106 deg.), abduction (105 deg.), ER (28 deg.), IR (58 deg.).  10/15/19:  flexion (122 deg.), abduction (111 deg.), ER (60 deg.).    Time  4    Period  Weeks    Status  Partially Met    Target Date  11/12/19      PT LONG TERM GOAL #4   Title  Pt. able to lift a gallon of milk/ grocery bags with no L shoulder pain/ limitations.    Baseline  No lifting with L shoulder at this time.    Time  4    Period  Weeks    Status  On-going    Target Date  11/12/19            Plan - 10/28/19 0927    Clinical Impression Statement  PT added L shoulder isometrics to HEP.  Pt. instructed in static holds with no increase in pain/ symptoms.  Pt. progressing with L shoulder AROM in standing posture with muscle weakness/ fasciculations noted.  Good technique/understanding of HEP.  Minimal to no L  UT compensatory movement patterns noted.  See updated HEP.    Examination-Activity Limitations  Bed Mobility;Bathing;Reach Overhead;Carry;Dressing;Sleep;Lift    Stability/Clinical Decision Making  Evolving/Moderate complexity    Clinical Decision Making  Moderate    Rehab Potential  Good    PT Frequency  2x / week    PT Duration  4 weeks    PT Treatment/Interventions  ADLs/Self Care Home Management;Cryotherapy;Electrical Stimulation;Moist Heat;Therapeutic exercise;Therapeutic activities;Functional mobility training;Neuromuscular re-education;Patient/family education;Manual techniques;Passive range of motion;Scar mobilization    PT Next Visit Plan  Increase L shoulder AA/AROM (follow MD protocol).  Discuss MD f/u    PT Home Exercise Plan  [id number]    Consulted and Agree with Plan of Care  Patient       Patient will benefit from skilled therapeutic intervention in order to improve the following deficits and impairments:  Improper body mechanics, Pain, Decreased coordination, Decreased mobility, Decreased activity tolerance, Decreased endurance, Decreased range of motion, Decreased strength, Impaired UE functional use, Impaired perceived functional ability, Hypomobility, Impaired flexibility  Visit Diagnosis: S/P complete repair of rotator cuff  Acute pain of left shoulder  Limited range of motion of shoulder  Weakness of shoulder  Muscle weakness (generalized)     Problem List Patient Active Problem List   Diagnosis Date Noted  . Nephrolithiasis 01/26/2017  . Left ureteral stone 01/03/2017  . Endometrial disorder 07/08/2015  . Bilateral occipital neuralgia 03/02/2015  . DDD (degenerative disc disease), lumbar 12/24/2014  . Facet syndrome, lumbar 12/24/2014  . Sacroiliac joint dysfunction 12/24/2014   Pura Spice, PT, DPT # [phone] 10/28/2019, 10:28 AM  [place] [facility] [address] [place], [place], [address] Phone: [phone]    Fax:  [phone]  Name: CANDELARIA PIES MRN: [id number] Date of Birth: 15-Jan-1966

## 2019-10-30 ENCOUNTER — Other Ambulatory Visit: Payer: Self-pay

## 2019-10-30 ENCOUNTER — Ambulatory Visit: Payer: Medicare Other | Admitting: Physical Therapy

## 2019-10-30 ENCOUNTER — Encounter: Payer: Self-pay | Admitting: Physical Therapy

## 2019-10-30 DIAGNOSIS — M25619 Stiffness of unspecified shoulder, not elsewhere classified: Secondary | ICD-10-CM

## 2019-10-30 DIAGNOSIS — M25512 Pain in left shoulder: Secondary | ICD-10-CM

## 2019-10-30 DIAGNOSIS — M6281 Muscle weakness (generalized): Secondary | ICD-10-CM

## 2019-10-30 DIAGNOSIS — R29898 Other symptoms and signs involving the musculoskeletal system: Secondary | ICD-10-CM

## 2019-10-30 DIAGNOSIS — Z9889 Other specified postprocedural states: Secondary | ICD-10-CM | POA: Diagnosis not present

## 2019-10-30 NOTE — Therapy (Signed)
[facility] [address] [place], [place], [address] Phone: [phone]   Fax:  [phone]  Physical Therapy Treatment  Patient Details  Name: Mariah Reeves MRN: [id number] Date of Birth: 05/10/1966 Referring Provider (PT): Dr. Mack Guise   Encounter Date: 10/30/2019  PT End of Session - 10/30/19 1041    Visit Number  12    Number of Visits  17    Date for PT Re-Evaluation  11/12/19    Authorization - Visit Number  4    Authorization - Number of Visits  10    PT Start Time  0914    PT Stop Time  1003    PT Time Calculation (min)  49 min    Activity Tolerance  Patient tolerated treatment well;Patient limited by pain    Behavior During Therapy  [facility] for tasks assessed/performed       Past Medical History:  Diagnosis Date  . Allergy   . Anomaly, uterus    tumor of uterus  . Anxiety   . Bilateral occipital neuralgia   . Bipolar 1 disorder ([place])   . Chronic back pain   . Complication of anesthesia    hard time waking up after gastric bypass.Pt stated that she has a small mouth and has had several surgeries since gastric bypass with no problems.  . Degenerative disc disease, lumbar   . Depression   . Facet syndrome, lumbar   . Family history of breast cancer    2/21 cancer genetic testing letter sent  . GERD (gastroesophageal reflux disease)    before gastric bypass  . Headache    Migraines  . History of kidney stones    Left  . Hx of vertigo   . Patella fracture    Left  . PTSD (post-traumatic stress disorder)   . Sleep apnea    C-PAP  . Wears glasses     Past Surgical History:  Procedure Laterality Date  . ABDOMINOPLASTY  2006  . CARPAL TUNNEL RELEASE Left   . CHOLECYSTECTOMY    . COLONOSCOPY W/ POLYPECTOMY    . CYSTOSCOPY W/ URETERAL STENT PLACEMENT  01/03/2017   Procedure: CYSTOSCOPY WITH RETROGRADE PYELOGRAM/URETERAL STENT PLACEMENT;  Surgeon: Nickie Retort, MD;  Location: [facility];  Service: Urology;;   . Consuela Mimes W/ URETERAL STENT PLACEMENT Left 01/17/2017   Procedure: CYSTOSCOPY WITH STENT REPLACEMENT;  Surgeon: Nickie Retort, MD;  Location: [facility];  Service: Urology;  Laterality: Left;  . DILATATION & CURETTAGE/HYSTEROSCOPY WITH MYOSURE N/A 07/08/2015   Procedure: DILATATION & CURETTAGE/HYSTEROSCOPY WITH MYOSURE;  Surgeon: Gae Dry, MD;  Location: [facility];  Service: Gynecology;  Laterality: N/A;  . DILATION AND CURETTAGE OF UTERUS     1/17  . EXTRACORPOREAL SHOCK WAVE LITHOTRIPSY Left 12/07/2016   Procedure: EXTRACORPOREAL SHOCK WAVE LITHOTRIPSY (ESWL);  Surgeon: Nickie Retort, MD;  Location: [facility];  Service: Urology;  Laterality: Left;  Marland Kitchen GASTRIC BYPASS  2005  . KNEE SURGERY Right    ACL- repair  . LITHOTRIPSY    . MIDDLE EAR SURGERY    . NASAL SEPTUM SURGERY    . OVARY SURGERY Right   . URETEROSCOPY WITH HOLMIUM LASER LITHOTRIPSY     perforated ureter. had nephrostomy tube for brief time  . URETEROSCOPY WITH HOLMIUM LASER LITHOTRIPSY Left 01/17/2017   Procedure: URETEROSCOPY WITH HOLMIUM LASER LITHOTRIPSY;  Surgeon: Nickie Retort, MD;  Location: [facility];  Service: Urology;  Laterality: Left;  There were no vitals filed for this visit.  Subjective Assessment - 10/30/19 1038    Subjective  Pt. states she is doing well.  Pt. has not started sh. isometrics ex. at home at this time.  Pt. states she has been active/busy.  MD is happy with pts. progress and wants 4 more weeks of PT to focus on strengthening before next MD f/u.    Pertinent History  Pt. known well to PT.  See previous PT treatment notes.    Patient Stated Goals  Increase L shoulder ROM/ pain mgmt.  Return to driving.    Currently in Pain?  Yes    Pain Score  4     Pain Location  Shoulder    Pain Orientation  Left    Pain Descriptors / Indicators  Aching          There.ex.:  Discussed isometric/ doorway ex. (pt. States she did not do HEP yesterday).   Standing/ supine L shoulder  AROM (all planes)- mirror feedback. Supine L shoulder A/AROM: serratus punches/ flexion/ horizontal abduction and adduction with PT assist 20x. Supine manual isometrics (all planes)- 3x each with holds.   B UBE 4 min. F/b (consistent cadence/ no increase pain)   STM to L UT/ deltoid/ biceps/ triceps musculature80mn.     PT Long Term Goals - 10/15/19 0933      PT LONG TERM GOAL #1   Title  Pt will score at least 64 on FOTO to demonstrate increased functional mobility.    Baseline  Initial FOTO: 40.  5/5: 41 (minimal changes).    Time  4    Period  Weeks    Status  Not Met    Target Date  11/12/19      PT LONG TERM GOAL #2   Title  Pt will decrease worst pain as reported on NPRS by at least 3 points in order to demonstrate clinically significant reduction in pain.    Baseline  Worst pain >5/10 L shoulder.  5/5: 4/10 L shoulder pain at worst    Time  4    Period  Weeks    Status  Partially Met    Target Date  11/12/19      PT LONG TERM GOAL #3   Title  Pt. independent with HEP to increase L shoulder AROM to WNL (all planes) as compared to R shoulder to improve functional mobility.    Baseline  L shoulder PROM: flexion (106 deg.), abduction (105 deg.), ER (28 deg.), IR (58 deg.).  10/15/19:  flexion (122 deg.), abduction (111 deg.), ER (60 deg.).    Time  4    Period  Weeks    Status  Partially Met    Target Date  11/12/19      PT LONG TERM GOAL #4   Title  Pt. able to lift a gallon of milk/ grocery bags with no L shoulder pain/ limitations.    Baseline  No lifting with L shoulder at this time.    Time  4    Period  Weeks    Status  On-going    Target Date  11/12/19            Plan - 10/30/19 1041    Clinical Impression Statement  Pt. presents with good/improving L shoulder AROM (all planes) in standing posture.  Pt. requires extra time/focus with L shoulder abduction in standing and serratus punches in supine due to muscle fatigue/weakness.  PT encouraged to  complete L sh. isometrics this weekend (no pain).    Examination-Activity Limitations  Bed Mobility;Bathing;Reach Overhead;Carry;Dressing;Sleep;Lift    Stability/Clinical Decision Making  Evolving/Moderate complexity    Clinical Decision Making  Moderate    Rehab Potential  Good    PT Frequency  2x / week    PT Duration  4 weeks    PT Treatment/Interventions  ADLs/Self Care Home Management;Cryotherapy;Electrical Stimulation;Moist Heat;Therapeutic exercise;Therapeutic activities;Functional mobility training;Neuromuscular re-education;Patient/family education;Manual techniques;Passive range of motion;Scar mobilization    PT Next Visit Plan  Increase L shoulder AA/AROM (follow MD protocol).    PT Home Exercise Plan  [id number]    Consulted and Agree with Plan of Care  Patient       Patient will benefit from skilled therapeutic intervention in order to improve the following deficits and impairments:  Improper body mechanics, Pain, Decreased coordination, Decreased mobility, Decreased activity tolerance, Decreased endurance, Decreased range of motion, Decreased strength, Impaired UE functional use, Impaired perceived functional ability, Hypomobility, Impaired flexibility  Visit Diagnosis: S/P complete repair of rotator cuff  Acute pain of left shoulder  Limited range of motion of shoulder  Weakness of shoulder  Muscle weakness (generalized)     Problem List Patient Active Problem List   Diagnosis Date Noted  . Nephrolithiasis 01/26/2017  . Left ureteral stone 01/03/2017  . Endometrial disorder 07/08/2015  . Bilateral occipital neuralgia 03/02/2015  . DDD (degenerative disc disease), lumbar 12/24/2014  . Facet syndrome, lumbar 12/24/2014  . Sacroiliac joint dysfunction 12/24/2014   Pura Spice, PT, DPT # [id number] 10/30/2019, 10:49 AM  [facility] [address] [place], [place], [address] Phone: [phone]   Fax:   [phone]  Name: AZARI JANSSENS MRN: [id number] Date of Birth: 12/26/1965

## 2019-11-03 ENCOUNTER — Other Ambulatory Visit: Payer: Self-pay

## 2019-11-03 ENCOUNTER — Ambulatory Visit: Payer: Medicare Other | Admitting: Physical Therapy

## 2019-11-03 ENCOUNTER — Encounter: Payer: Self-pay | Admitting: Physical Therapy

## 2019-11-03 DIAGNOSIS — M25619 Stiffness of unspecified shoulder, not elsewhere classified: Secondary | ICD-10-CM

## 2019-11-03 DIAGNOSIS — M25512 Pain in left shoulder: Secondary | ICD-10-CM

## 2019-11-03 DIAGNOSIS — Z9889 Other specified postprocedural states: Secondary | ICD-10-CM

## 2019-11-03 DIAGNOSIS — R29898 Other symptoms and signs involving the musculoskeletal system: Secondary | ICD-10-CM

## 2019-11-03 DIAGNOSIS — M6281 Muscle weakness (generalized): Secondary | ICD-10-CM

## 2019-11-03 NOTE — Therapy (Signed)
[facility] [address] [place], [place], [address] Phone: [phone]   Fax:  [phone]  Physical Therapy Treatment  Patient Details  Name: Mariah Reeves MRN: [id number] Date of Birth: 1965-10-06 Referring Provider (PT): Dr. Mack Guise   Encounter Date: 11/03/2019  PT End of Session - 11/03/19 0926    Visit Number  13    Number of Visits  17    Date for PT Re-Evaluation  11/12/19    Authorization - Visit Number  5    Authorization - Number of Visits  10    PT Start Time  0910    PT Stop Time  0953    PT Time Calculation (min)  43 min    Activity Tolerance  Patient tolerated treatment well;Patient limited by pain    Behavior During Therapy  [facility] for tasks assessed/performed       Past Medical History:  Diagnosis Date  . Allergy   . Anomaly, uterus    tumor of uterus  . Anxiety   . Bilateral occipital neuralgia   . Bipolar 1 disorder ([place])   . Chronic back pain   . Complication of anesthesia    hard time waking up after gastric bypass.Pt stated that she has a small mouth and has had several surgeries since gastric bypass with no problems.  . Degenerative disc disease, lumbar   . Depression   . Facet syndrome, lumbar   . Family history of breast cancer    2/21 cancer genetic testing letter sent  . GERD (gastroesophageal reflux disease)    before gastric bypass  . Headache    Migraines  . History of kidney stones    Left  . Hx of vertigo   . Patella fracture    Left  . PTSD (post-traumatic stress disorder)   . Sleep apnea    C-PAP  . Wears glasses     Past Surgical History:  Procedure Laterality Date  . ABDOMINOPLASTY  2006  . CARPAL TUNNEL RELEASE Left   . CHOLECYSTECTOMY    . COLONOSCOPY W/ POLYPECTOMY    . CYSTOSCOPY W/ URETERAL STENT PLACEMENT  01/03/2017   Procedure: CYSTOSCOPY WITH RETROGRADE PYELOGRAM/URETERAL STENT PLACEMENT;  Surgeon: Nickie Retort, MD;  Location: [facility];  Service: Urology;;   . Consuela Mimes W/ URETERAL STENT PLACEMENT Left 01/17/2017   Procedure: CYSTOSCOPY WITH STENT REPLACEMENT;  Surgeon: Nickie Retort, MD;  Location: [facility];  Service: Urology;  Laterality: Left;  . DILATATION & CURETTAGE/HYSTEROSCOPY WITH MYOSURE N/A 07/08/2015   Procedure: DILATATION & CURETTAGE/HYSTEROSCOPY WITH MYOSURE;  Surgeon: Gae Dry, MD;  Location: [facility];  Service: Gynecology;  Laterality: N/A;  . DILATION AND CURETTAGE OF UTERUS     1/17  . EXTRACORPOREAL SHOCK WAVE LITHOTRIPSY Left 12/07/2016   Procedure: EXTRACORPOREAL SHOCK WAVE LITHOTRIPSY (ESWL);  Surgeon: Nickie Retort, MD;  Location: [facility];  Service: Urology;  Laterality: Left;  Marland Kitchen GASTRIC BYPASS  2005  . KNEE SURGERY Right    ACL- repair  . LITHOTRIPSY    . MIDDLE EAR SURGERY    . NASAL SEPTUM SURGERY    . OVARY SURGERY Right   . URETEROSCOPY WITH HOLMIUM LASER LITHOTRIPSY     perforated ureter. had nephrostomy tube for brief time  . URETEROSCOPY WITH HOLMIUM LASER LITHOTRIPSY Left 01/17/2017   Procedure: URETEROSCOPY WITH HOLMIUM LASER LITHOTRIPSY;  Surgeon: Nickie Retort, MD;  Location: [facility];  Service: Urology;  Laterality: Left;  There were no vitals filed for this visit.  Subjective Assessment - 11/03/19 0925    Subjective  Pt. states she still has a hard time sleeping on L side ("pressure is too painful").  Pt. reports 5/10 L shoulder pain currently at rest .    Pertinent History  Pt. known well to PT.  See previous PT treatment notes.    Patient Stated Goals  Increase L shoulder ROM/ pain mgmt.  Return to driving.    Currently in Pain?  Yes    Pain Score  5     Pain Location  Shoulder    Pain Orientation  Left         There.ex.:  B UBE 4 min. F/b (consistent cadence/ no increase pain) Supine L shoulder isometric (all planes)- manual resistance 5x each plane of movement.  Standing/ supine L shoulder AROM (all planes)- mirror feedback. Supine L shoulder A/AROM: serratus  punches/flexion/horizontal abduction andadduction with PT assist 20x. Standing RTB ex.: scap. Retraction/ tricep extension/ B external rotation/ bicep curls 20x each (mirror feedback).    STM to L UT/ deltoid/ biceps/ triceps musculature41mn.during ROM in supine       PT Long Term Goals - 10/15/19 0933      PT LONG TERM GOAL #1   Title  Pt will score at least 64 on FOTO to demonstrate increased functional mobility.    Baseline  Initial FOTO: 40.  5/5: 41 (minimal changes).    Time  4    Period  Weeks    Status  Not Met    Target Date  11/12/19      PT LONG TERM GOAL #2   Title  Pt will decrease worst pain as reported on NPRS by at least 3 points in order to demonstrate clinically significant reduction in pain.    Baseline  Worst pain >5/10 L shoulder.  5/5: 4/10 L shoulder pain at worst    Time  4    Period  Weeks    Status  Partially Met    Target Date  11/12/19      PT LONG TERM GOAL #3   Title  Pt. independent with HEP to increase L shoulder AROM to WNL (all planes) as compared to R shoulder to improve functional mobility.    Baseline  L shoulder PROM: flexion (106 deg.), abduction (105 deg.), ER (28 deg.), IR (58 deg.).  10/15/19:  flexion (122 deg.), abduction (111 deg.), ER (60 deg.).    Time  4    Period  Weeks    Status  Partially Met    Target Date  11/12/19      PT LONG TERM GOAL #4   Title  Pt. able to lift a gallon of milk/ grocery bags with no L shoulder pain/ limitations.    Baseline  No lifting with L shoulder at this time.    Time  4    Period  Weeks    Status  On-going    Target Date  11/12/19            Plan - 11/03/19 0926    Clinical Impression Statement  Pt. demonstrates increase L shoulder AROM (flexion/ abduction) in standing posture.  Extra time to increase shoulder ROM and extend L elbow.  Pt. progressing to more resisted ex. in a pain tolerable range with mirror feedback.  Good technique with ex.  PT will issue more progressive ex.  program next tx. session.    Examination-Activity Limitations  Bed Mobility;Bathing;Reach Overhead;Carry;Dressing;Sleep;Lift    Stability/Clinical Decision Making  Evolving/Moderate complexity    Clinical Decision Making  Moderate    Rehab Potential  Good    PT Frequency  2x / week    PT Duration  4 weeks    PT Treatment/Interventions  ADLs/Self Care Home Management;Cryotherapy;Electrical Stimulation;Moist Heat;Therapeutic exercise;Therapeutic activities;Functional mobility training;Neuromuscular re-education;Patient/family education;Manual techniques;Passive range of motion;Scar mobilization    PT Next Visit Plan  Increase L shoulder AA/AROM (follow MD protocol).  Progress shoulder strengthening (RTB) next tx.    PT Home Exercise Plan  [id number]    Consulted and Agree with Plan of Care  Patient       Patient will benefit from skilled therapeutic intervention in order to improve the following deficits and impairments:  Improper body mechanics, Pain, Decreased coordination, Decreased mobility, Decreased activity tolerance, Decreased endurance, Decreased range of motion, Decreased strength, Impaired UE functional use, Impaired perceived functional ability, Hypomobility, Impaired flexibility  Visit Diagnosis: S/P complete repair of rotator cuff  Acute pain of left shoulder  Limited range of motion of shoulder  Weakness of shoulder  Muscle weakness (generalized)     Problem List Patient Active Problem List   Diagnosis Date Noted  . Nephrolithiasis 01/26/2017  . Left ureteral stone 01/03/2017  . Endometrial disorder 07/08/2015  . Bilateral occipital neuralgia 03/02/2015  . DDD (degenerative disc disease), lumbar 12/24/2014  . Facet syndrome, lumbar 12/24/2014  . Sacroiliac joint dysfunction 12/24/2014   Pura Spice, PT, DPT # [phone] 11/04/2019, 7:18 AM  [facility] [address] [place], [place], [address] Phone: [phone]    Fax:  [phone]  Name: KELIYAH SPILLMAN MRN: [id number] Date of Birth: [birth year]

## 2019-11-04 ENCOUNTER — Encounter: Payer: Medicare Other | Admitting: Physical Therapy

## 2019-11-06 ENCOUNTER — Ambulatory Visit: Payer: Medicare Other | Admitting: Physical Therapy

## 2019-11-06 ENCOUNTER — Encounter: Payer: Self-pay | Admitting: Physical Therapy

## 2019-11-06 ENCOUNTER — Other Ambulatory Visit: Payer: Self-pay

## 2019-11-06 DIAGNOSIS — Z9889 Other specified postprocedural states: Secondary | ICD-10-CM

## 2019-11-06 DIAGNOSIS — M25512 Pain in left shoulder: Secondary | ICD-10-CM

## 2019-11-06 DIAGNOSIS — R29898 Other symptoms and signs involving the musculoskeletal system: Secondary | ICD-10-CM

## 2019-11-06 DIAGNOSIS — M25619 Stiffness of unspecified shoulder, not elsewhere classified: Secondary | ICD-10-CM

## 2019-11-06 NOTE — Therapy (Signed)
[facility] [address] [place], [place], [address] Phone: [phone]   Fax:  [phone]  Physical Therapy Treatment  Patient Details  Name: Mariah Reeves MRN: [id number] Date of Birth: 08-15-1965 Referring Provider (PT): Dr. Mack Guise   Encounter Date: 11/06/2019   Treatment: 14 of 17.  Recert date: 4/0/76808 1114 to 1201   Past Medical History:  Diagnosis Date  . Allergy   . Anomaly, uterus    tumor of uterus  . Anxiety   . Bilateral occipital neuralgia   . Bipolar 1 disorder ([place])   . Chronic back pain   . Complication of anesthesia    hard time waking up after gastric bypass.Pt stated that she has a small mouth and has had several surgeries since gastric bypass with no problems.  . Degenerative disc disease, lumbar   . Depression   . Facet syndrome, lumbar   . Family history of breast cancer    2/21 cancer genetic testing letter sent  . GERD (gastroesophageal reflux disease)    before gastric bypass  . Headache    Migraines  . History of kidney stones    Left  . Hx of vertigo   . Patella fracture    Left  . PTSD (post-traumatic stress disorder)   . Sleep apnea    C-PAP  . Wears glasses     Past Surgical History:  Procedure Laterality Date  . ABDOMINOPLASTY  2006  . CARPAL TUNNEL RELEASE Left   . CHOLECYSTECTOMY    . COLONOSCOPY W/ POLYPECTOMY    . CYSTOSCOPY W/ URETERAL STENT PLACEMENT  01/03/2017   Procedure: CYSTOSCOPY WITH RETROGRADE PYELOGRAM/URETERAL STENT PLACEMENT;  Surgeon: Nickie Retort, MD;  Location: [facility];  Service: Urology;;  . Consuela Mimes W/ URETERAL STENT PLACEMENT Left 01/17/2017   Procedure: CYSTOSCOPY WITH STENT REPLACEMENT;  Surgeon: Nickie Retort, MD;  Location: [facility];  Service: Urology;  Laterality: Left;  . DILATATION & CURETTAGE/HYSTEROSCOPY WITH MYOSURE N/A 07/08/2015   Procedure: DILATATION & CURETTAGE/HYSTEROSCOPY WITH MYOSURE;  Surgeon: Gae Dry, MD;   Location: [facility];  Service: Gynecology;  Laterality: N/A;  . DILATION AND CURETTAGE OF UTERUS     1/17  . EXTRACORPOREAL SHOCK WAVE LITHOTRIPSY Left 12/07/2016   Procedure: EXTRACORPOREAL SHOCK WAVE LITHOTRIPSY (ESWL);  Surgeon: Nickie Retort, MD;  Location: [facility];  Service: Urology;  Laterality: Left;  Marland Kitchen GASTRIC BYPASS  2005  . KNEE SURGERY Right    ACL- repair  . LITHOTRIPSY    . MIDDLE EAR SURGERY    . NASAL SEPTUM SURGERY    . OVARY SURGERY Right   . URETEROSCOPY WITH HOLMIUM LASER LITHOTRIPSY     perforated ureter. had nephrostomy tube for brief time  . URETEROSCOPY WITH HOLMIUM LASER LITHOTRIPSY Left 01/17/2017   Procedure: URETEROSCOPY WITH HOLMIUM LASER LITHOTRIPSY;  Surgeon: Nickie Retort, MD;  Location: [facility];  Service: Urology;  Laterality: Left;    There were no vitals filed for this visit.    Pt. states she is sleeping 4-5 hours/ night. Pt. reports 6/10 L shoulder pain prior to tx. session.        There.ex.:  B UBE39mn. F/b (consistent cadence/ no increase pain) Standing RTB ex.: scap. Retraction/ tricep extension/ B external rotation/ bicep curls 20x each (mirror feedback).   Supine L shoulder isometric (all planes)- manual resistance 5x each plane of movement. Standing/ supine L shoulder AROM (all planes)- mirror feedback. Supine L shoulder A/AROM: serratus punches/flexion/horizontal  abduction andadduction with PT assist 20x. See updated new HEP (handouts)     STM to L UT/ deltoid/ biceps/ triceps musculature2mn.during ROM in supine    PT Long Term Goals - 10/15/19 0933      PT LONG TERM GOAL #1   Title  Pt will score at least 64 on FOTO to demonstrate increased functional mobility.    Baseline  Initial FOTO: 40.  5/5: 41 (minimal changes).    Time  4    Period  Weeks    Status  Not Met    Target Date  11/12/19      PT LONG TERM GOAL #2   Title  Pt will decrease worst pain as reported on NPRS by at least 3 points in  order to demonstrate clinically significant reduction in pain.    Baseline  Worst pain >5/10 L shoulder.  5/5: 4/10 L shoulder pain at worst    Time  4    Period  Weeks    Status  Partially Met    Target Date  11/12/19      PT LONG TERM GOAL #3   Title  Pt. independent with HEP to increase L shoulder AROM to WNL (all planes) as compared to R shoulder to improve functional mobility.    Baseline  L shoulder PROM: flexion (106 deg.), abduction (105 deg.), ER (28 deg.), IR (58 deg.).  10/15/19:  flexion (122 deg.), abduction (111 deg.), ER (60 deg.).    Time  4    Period  Weeks    Status  Partially Met    Target Date  11/12/19      PT LONG TERM GOAL #4   Title  Pt. able to lift a gallon of milk/ grocery bags with no L shoulder pain/ limitations.    Baseline  No lifting with L shoulder at this time.    Time  4    Period  Weeks    Status  On-going    Target Date  11/12/19        Good technique with resisted ther.ex. No increase c/o shoulder pain reported. PT issued new progressive HEP for pt. to complete with no increase c/o pain.       Patient will benefit from skilled therapeutic intervention in order to improve the following deficits and impairments:  Improper body mechanics, Pain, Decreased coordination, Decreased mobility, Decreased activity tolerance, Decreased endurance, Decreased range of motion, Decreased strength, Impaired UE functional use, Impaired perceived functional ability, Hypomobility, Impaired flexibility  Visit Diagnosis: S/P complete repair of rotator cuff  Acute pain of left shoulder  Limited range of motion of shoulder  Weakness of shoulder     Problem List Patient Active Problem List   Diagnosis Date Noted  . Nephrolithiasis 01/26/2017  . Left ureteral stone 01/03/2017  . Endometrial disorder 07/08/2015  . Bilateral occipital neuralgia 03/02/2015  . DDD (degenerative disc disease), lumbar 12/24/2014  . Facet syndrome, lumbar 12/24/2014  .  Sacroiliac joint dysfunction 12/24/2014   MPura Spice PT, DPT # [phone]/29/2021, 1:09 PM  [facility]MM[address] [place] [address]Phone: [phone]  Fax:  [phone] Name: Mariah BRIERLEYMRN: [id number] of Birth: 908/10/1965

## 2019-11-11 ENCOUNTER — Ambulatory Visit: Payer: Medicare Other | Attending: Orthopedic Surgery | Admitting: Physical Therapy

## 2019-11-11 ENCOUNTER — Other Ambulatory Visit: Payer: Self-pay

## 2019-11-11 ENCOUNTER — Encounter: Payer: Self-pay | Admitting: Physical Therapy

## 2019-11-11 DIAGNOSIS — M25512 Pain in left shoulder: Secondary | ICD-10-CM | POA: Diagnosis present

## 2019-11-11 DIAGNOSIS — Z9889 Other specified postprocedural states: Secondary | ICD-10-CM

## 2019-11-11 DIAGNOSIS — M25619 Stiffness of unspecified shoulder, not elsewhere classified: Secondary | ICD-10-CM | POA: Diagnosis present

## 2019-11-11 DIAGNOSIS — M6281 Muscle weakness (generalized): Secondary | ICD-10-CM | POA: Diagnosis present

## 2019-11-11 DIAGNOSIS — R29898 Other symptoms and signs involving the musculoskeletal system: Secondary | ICD-10-CM | POA: Diagnosis present

## 2019-11-11 NOTE — Therapy (Signed)
[place] [facility] [address]. [place], [place], [address] Phone: [phone]   Fax:  [phone]  Physical Therapy Treatment  Patient Details  Name: Mariah Reeves MRN: [id number] Date of Birth: June 03, 1966 Referring Provider (PT): Dr. Mack Guise   Encounter Date: 11/11/2019  PT End of Session - 11/11/19 1254    Visit Number  15    Number of Visits  17    Date for PT Re-Evaluation  11/12/19    Authorization - Visit Number  7    Authorization - Number of Visits  10    PT Start Time  1102    PT Stop Time  1146    PT Time Calculation (min)  44 min    Activity Tolerance  Patient tolerated treatment well;Patient limited by pain    Behavior During Therapy  [facility] for tasks assessed/performed       Past Medical History:  Diagnosis Date   Allergy    Anomaly, uterus    tumor of uterus   Anxiety    Bilateral occipital neuralgia    Bipolar 1 disorder ([place])    Chronic back pain    Complication of anesthesia    hard time waking up after gastric bypass.Pt stated that she has a small mouth and has had several surgeries since gastric bypass with no problems.   Degenerative disc disease, lumbar    Depression    Facet syndrome, lumbar    Family history of breast cancer    2/21 cancer genetic testing letter sent   GERD (gastroesophageal reflux disease)    before gastric bypass   Headache    Migraines   History of kidney stones    Left   Hx of vertigo    Patella fracture    Left   PTSD (post-traumatic stress disorder)    Sleep apnea    C-PAP   Wears glasses     Past Surgical History:  Procedure Laterality Date   ABDOMINOPLASTY  2006   CARPAL TUNNEL RELEASE Left    CHOLECYSTECTOMY     COLONOSCOPY W/ POLYPECTOMY     CYSTOSCOPY W/ URETERAL STENT PLACEMENT  01/03/2017   Procedure: CYSTOSCOPY WITH RETROGRADE PYELOGRAM/URETERAL STENT PLACEMENT;  Surgeon: Nickie Retort, MD;  Location: [facility];  Service: Urology;;    CYSTOSCOPY W/ URETERAL STENT PLACEMENT Left 01/17/2017   Procedure: CYSTOSCOPY WITH STENT REPLACEMENT;  Surgeon: Nickie Retort, MD;  Location: [facility];  Service: Urology;  Laterality: Left;   DILATATION & CURETTAGE/HYSTEROSCOPY WITH MYOSURE N/A 07/08/2015   Procedure: DILATATION & CURETTAGE/HYSTEROSCOPY WITH MYOSURE;  Surgeon: Gae Dry, MD;  Location: [facility];  Service: Gynecology;  Laterality: N/A;   DILATION AND CURETTAGE OF UTERUS     1/17   EXTRACORPOREAL SHOCK WAVE LITHOTRIPSY Left 12/07/2016   Procedure: EXTRACORPOREAL SHOCK WAVE LITHOTRIPSY (ESWL);  Surgeon: Nickie Retort, MD;  Location: [facility];  Service: Urology;  Laterality: Left;   GASTRIC BYPASS  2005   KNEE SURGERY Right    ACL- repair   LITHOTRIPSY     MIDDLE EAR SURGERY     NASAL SEPTUM SURGERY     OVARY SURGERY Right    URETEROSCOPY WITH HOLMIUM LASER LITHOTRIPSY     perforated ureter. had nephrostomy tube for brief time   URETEROSCOPY WITH HOLMIUM LASER LITHOTRIPSY Left 01/17/2017   Procedure: URETEROSCOPY WITH HOLMIUM LASER LITHOTRIPSY;  Surgeon: Nickie Retort, MD;  Location: [facility];  Service: Urology;  Laterality: Left;  There were no vitals filed for this visit.  Subjective Assessment - 11/11/19 1251    Subjective  Pt. returned to driving for 3 hours and did well but tired.  Pt. has c/o L shoulder discomfort this morning.    Pertinent History  Pt. known well to PT.  See previous PT treatment notes.    Patient Stated Goals  Increase L shoulder ROM/ pain mgmt.  Return to driving.    Currently in Pain?  Yes    Pain Score  5     Pain Location  Shoulder    Pain Orientation  Left    Pain Descriptors / Indicators  Aching       There.ex.:  Standing B shoulder AROM: flexion/ abduction 5x.   Seated B UBE: 4 min. F/b. Nautilus: wand lat. Pull down in seated 30#/ standing tricep extension 30#/ B sh. Adduction with handles 20#/ scap. Retraction with handles 20# 10x2 each. Supine  L shoulder AROM: flexion/ horizontal abduction/ serratus punches (no resistance) 10x2. STM to L UT/ deltoid/ biceps/ triceps in supine position.  Decrease generalized tenderness.   Pt. Instructed to ice L sh. To prevent soreness.    PT Long Term Goals - 10/15/19 0933      PT LONG TERM GOAL #1   Title  Pt will score at least 64 on FOTO to demonstrate increased functional mobility.    Baseline  Initial FOTO: 40.  5/5: 41 (minimal changes).    Time  4    Period  Weeks    Status  Not Met    Target Date  11/12/19      PT LONG TERM GOAL #2   Title  Pt will decrease worst pain as reported on NPRS by at least 3 points in order to demonstrate clinically significant reduction in pain.    Baseline  Worst pain >5/10 L shoulder.  5/5: 4/10 L shoulder pain at worst    Time  4    Period  Weeks    Status  Partially Met    Target Date  11/12/19      PT LONG TERM GOAL #3   Title  Pt. independent with HEP to increase L shoulder AROM to WNL (all planes) as compared to R shoulder to improve functional mobility.    Baseline  L shoulder PROM: flexion (106 deg.), abduction (105 deg.), ER (28 deg.), IR (58 deg.).  10/15/19:  flexion (122 deg.), abduction (111 deg.), ER (60 deg.).    Time  4    Period  Weeks    Status  Partially Met    Target Date  11/12/19      PT LONG TERM GOAL #4   Title  Pt. able to lift a gallon of milk/ grocery bags with no L shoulder pain/ limitations.    Baseline  No lifting with L shoulder at this time.    Time  4    Period  Weeks    Status  On-going    Target Date  11/12/19            Plan - 11/11/19 1255    Clinical Impression Statement  Pt. able to elevate L shoulder overhead (AROM) in standing/ supine position with extra time required.  No increase c/o L shoulder pain with resisted ther.ex. but fatigue noted and several short rest breaks.  Good technique with resisted ex. on Nautilus and able to complete wand and handle UE ex.    Examination-Activity Limitations   Bed Mobility;Bathing;Reach  Overhead;Carry;Dressing;Sleep;Lift   ° Stability/Clinical Decision Making  Evolving/Moderate complexity   ° Clinical Decision Making  Moderate   ° Rehab Potential  Good   ° PT Frequency  2x / week   ° PT Duration  4 weeks   ° PT Treatment/Interventions  ADLs/Self Care Home Management;Cryotherapy;Electrical Stimulation;Moist Heat;Therapeutic exercise;Therapeutic activities;Functional mobility training;Neuromuscular re-education;Patient/family education;Manual techniques;Passive range of motion;Scar mobilization   ° PT Next Visit Plan  Increase L shoulder AROM and progress with light resistance ex.   ° PT Home Exercise Plan  [id number]   ° Consulted and Agree with Plan of Care  Patient   °  ° ° °Patient will benefit from skilled therapeutic intervention in order to improve the following deficits and impairments:  Improper body mechanics, Pain, Decreased coordination, Decreased mobility, Decreased activity tolerance, Decreased endurance, Decreased range of motion, Decreased strength, Impaired UE functional use, Impaired perceived functional ability, Hypomobility, Impaired flexibility ° °Visit Diagnosis: °S/P complete repair of rotator cuff ° °Acute pain of left shoulder ° °Limited range of motion of shoulder ° °Weakness of shoulder ° °Muscle weakness (generalized) ° ° ° ° °Problem List °Patient Active Problem List  ° Diagnosis Date Noted  °• Nephrolithiasis 01/26/2017  °• Left ureteral stone 01/03/2017  °• Endometrial disorder 07/08/2015  °• Bilateral occipital neuralgia 03/02/2015  °• DDD (degenerative disc disease), lumbar 12/24/2014  °• Facet syndrome, lumbar 12/24/2014  °• Sacroiliac joint dysfunction 12/24/2014  ° °Michael C Sherk, PT, DPT # [id number] °11/11/2019, 1:02 PM ° °[place] °[facility] °[address] °[place], [place], [address] °Phone: [phone]   Fax:  [phone] ° °Name: Mariah Reeves °MRN: [id number] °Date of Birth: 10/31/1965 ° ° °

## 2019-11-13 ENCOUNTER — Ambulatory Visit: Payer: Medicare Other | Admitting: Physical Therapy

## 2019-11-18 ENCOUNTER — Ambulatory Visit: Payer: Medicare Other | Admitting: Physical Therapy

## 2019-11-20 ENCOUNTER — Ambulatory Visit: Payer: Medicare Other | Admitting: Physical Therapy

## 2019-11-20 ENCOUNTER — Other Ambulatory Visit: Payer: Self-pay

## 2019-11-20 ENCOUNTER — Encounter: Payer: Self-pay | Admitting: Physical Therapy

## 2019-11-20 DIAGNOSIS — Z9889 Other specified postprocedural states: Secondary | ICD-10-CM

## 2019-11-20 DIAGNOSIS — M25619 Stiffness of unspecified shoulder, not elsewhere classified: Secondary | ICD-10-CM

## 2019-11-20 DIAGNOSIS — M6281 Muscle weakness (generalized): Secondary | ICD-10-CM

## 2019-11-20 DIAGNOSIS — M25512 Pain in left shoulder: Secondary | ICD-10-CM

## 2019-11-20 DIAGNOSIS — R29898 Other symptoms and signs involving the musculoskeletal system: Secondary | ICD-10-CM

## 2019-11-20 NOTE — Therapy (Signed)
[place] [facility] [address]. [place], [place], [address] Phone: [phone]   Fax:  [phone]  Physical Therapy Treatment  Patient Details  Name: Mariah Reeves MRN: [id number] Date of Birth: Oct 01, 1965 Referring Provider (PT): Dr. Mack Guise   Encounter Date: 11/20/2019  Treatment: 16 of 24.  Recert date: 0/06/7492 4967 to 22   Past Medical History:  Diagnosis Date  . Allergy   . Anomaly, uterus    tumor of uterus  . Anxiety   . Bilateral occipital neuralgia   . Bipolar 1 disorder ([place])   . Chronic back pain   . Complication of anesthesia    hard time waking up after gastric bypass.Pt stated that she has a small mouth and has had several surgeries since gastric bypass with no problems.  . Degenerative disc disease, lumbar   . Depression   . Facet syndrome, lumbar   . Family history of breast cancer    2/21 cancer genetic testing letter sent  . GERD (gastroesophageal reflux disease)    before gastric bypass  . Headache    Migraines  . History of kidney stones    Left  . Hx of vertigo   . Patella fracture    Left  . PTSD (post-traumatic stress disorder)   . Sleep apnea    C-PAP  . Wears glasses     Past Surgical History:  Procedure Laterality Date  . ABDOMINOPLASTY  2006  . CARPAL TUNNEL RELEASE Left   . CHOLECYSTECTOMY    . COLONOSCOPY W/ POLYPECTOMY    . CYSTOSCOPY W/ URETERAL STENT PLACEMENT  01/03/2017   Procedure: CYSTOSCOPY WITH RETROGRADE PYELOGRAM/URETERAL STENT PLACEMENT;  Surgeon: Nickie Retort, MD;  Location: [facility];  Service: Urology;;  . Consuela Mimes W/ URETERAL STENT PLACEMENT Left 01/17/2017   Procedure: CYSTOSCOPY WITH STENT REPLACEMENT;  Surgeon: Nickie Retort, MD;  Location: [facility];  Service: Urology;  Laterality: Left;  . DILATATION & CURETTAGE/HYSTEROSCOPY WITH MYOSURE N/A 07/08/2015   Procedure: DILATATION & CURETTAGE/HYSTEROSCOPY WITH MYOSURE;  Surgeon: Gae Dry, MD;   Location: [facility];  Service: Gynecology;  Laterality: N/A;  . DILATION AND CURETTAGE OF UTERUS     1/17  . EXTRACORPOREAL SHOCK WAVE LITHOTRIPSY Left 12/07/2016   Procedure: EXTRACORPOREAL SHOCK WAVE LITHOTRIPSY (ESWL);  Surgeon: Nickie Retort, MD;  Location: [facility];  Service: Urology;  Laterality: Left;  [place] GASTRIC BYPASS  2005  . KNEE SURGERY Right    ACL- repair  . LITHOTRIPSY    . MIDDLE EAR SURGERY    . NASAL SEPTUM SURGERY    . OVARY SURGERY Right   . URETEROSCOPY WITH HOLMIUM LASER LITHOTRIPSY     perforated ureter. had nephrostomy tube for brief time  . URETEROSCOPY WITH HOLMIUM LASER LITHOTRIPSY Left 01/17/2017   Procedure: URETEROSCOPY WITH HOLMIUM LASER LITHOTRIPSY;  Surgeon: Nickie Retort, MD;  Location: [facility];  Service: Urology;  Laterality: Left;    There were no vitals filed for this visit.    Pt. states she is doing well. Slight difficulty donning seatbelt in car. Pt. states she has been having less and less pain.     There.ex.:  Seated B UBE: 3 min. F/b. Nautilus: wand lat. Pull down in seated 30#/ standing tricep extension 30#/ B sh. Adduction with handles 20#/  scap. Retraction with handles 20# 30x each. Supine L shoulder AROM: flexion/ horizontal abduction/ serratus punches (no resistance) 10x2. STM to L UT/ deltoid/ biceps/ triceps in supine position.  Decrease generalized tenderness.   Pt. Instructed to ice L sh. To prevent soreness.       PT Long Term Goals - 11/24/19 0904      PT LONG TERM GOAL #1   Title Pt will score at least 64 on FOTO to demonstrate increased functional mobility.    Baseline Initial FOTO: 40.  5/5: 41 (minimal changes).    Time 4    Period Weeks    Status Not Met    Target Date 12/18/19      PT LONG TERM GOAL #2   Title Pt will decrease worst pain as reported on NPRS by at least 3 points in order to demonstrate clinically significant reduction in pain.    Baseline Worst pain >5/10 L shoulder.  5/5: 4/10 L  shoulder pain at worst    Time 4    Period Weeks    Status Partially Met    Target Date 12/18/19      PT LONG TERM GOAL #3   Title Pt. independent with HEP to increase L shoulder AROM to WNL (all planes) as compared to R shoulder to improve functional mobility.    Baseline L shoulder PROM: flexion (106 deg.), abduction (105 deg.), ER (28 deg.), IR (58 deg.).  10/15/19:  flexion (122 deg.), abduction (111 deg.), ER (60 deg.).    Time 4    Period Weeks    Status Achieved    Target Date 11/20/19      PT LONG TERM GOAL #4   Title Pt. able to lift a gallon of milk/ grocery bags with no L shoulder pain/ limitations.    Baseline No lifting with L shoulder at this time.    Time 4    Period Weeks    Status On-going    Target Date 12/18/19             Pt. has progressed well with L shoulder AROM (all planes) of movement in pain tolerable range. Pts. primary focus at this time is L shoulder strengthening and muscle endurance with functional tasks/ ADL. See updated goals.      Patient will benefit from skilled therapeutic intervention in order to improve the following deficits and impairments:  Improper body mechanics, Pain, Decreased coordination, Decreased mobility, Decreased activity tolerance, Decreased endurance, Decreased range of motion, Decreased strength, Impaired UE functional use, Impaired perceived functional ability, Hypomobility, Impaired flexibility  Visit Diagnosis: S/P complete repair of rotator cuff  Acute pain of left shoulder  Limited range of motion of shoulder  Weakness of shoulder  Muscle weakness (generalized)     Problem List Patient Active Problem List   Diagnosis Date Noted  . Nephrolithiasis 01/26/2017  . Left ureteral stone 01/03/2017  . Endometrial disorder 07/08/2015  . Bilateral occipital neuralgia 03/02/2015  . DDD (degenerative disc disease), lumbar 12/24/2014  . Facet syndrome, lumbar 12/24/2014  . Sacroiliac joint dysfunction 12/24/2014    Pura Spice, PT, DPT # [id number] 11/24/2019, 9:15 AM  [place] [address] [place], [place], [address] Phone: [phone]   Fax:  [phone]  Name: Mariah Reeves MRN: [id number] Date of Birth: 1965-10-03

## 2019-11-24 ENCOUNTER — Telehealth: Payer: Self-pay

## 2019-11-24 ENCOUNTER — Other Ambulatory Visit: Payer: Self-pay | Admitting: Obstetrics and Gynecology

## 2019-11-24 MED ORDER — ESTRADIOL 0.1 MG/GM VA CREA: 1.0000 g | TOPICAL_CREAM | VAGINAL | 3 refills | Status: DC

## 2019-11-24 NOTE — Telephone Encounter (Signed)
Switched to estrace

## 2019-11-24 NOTE — Telephone Encounter (Signed)
[facility] sent over a drug change request for Premarin Vag. 30gm tube. Drug no longer covered by patients insurance plan. Estradiol, Yuvafem, Estradiol Tablet, Imvexxy supp. Are all covered. Please advise

## 2019-11-24 NOTE — Telephone Encounter (Signed)
Pt is aware.  

## 2019-11-25 ENCOUNTER — Other Ambulatory Visit: Payer: Self-pay

## 2019-11-25 ENCOUNTER — Ambulatory Visit: Payer: Medicare Other | Admitting: Physical Therapy

## 2019-11-25 ENCOUNTER — Encounter: Payer: Self-pay | Admitting: Physical Therapy

## 2019-11-25 DIAGNOSIS — Z9889 Other specified postprocedural states: Secondary | ICD-10-CM | POA: Diagnosis not present

## 2019-11-25 DIAGNOSIS — M25512 Pain in left shoulder: Secondary | ICD-10-CM

## 2019-11-25 DIAGNOSIS — M6281 Muscle weakness (generalized): Secondary | ICD-10-CM

## 2019-11-25 DIAGNOSIS — R29898 Other symptoms and signs involving the musculoskeletal system: Secondary | ICD-10-CM

## 2019-11-25 DIAGNOSIS — M25619 Stiffness of unspecified shoulder, not elsewhere classified: Secondary | ICD-10-CM

## 2019-11-25 NOTE — Therapy (Signed)
[place] [facility] [address] [place], [place], [address] Phone: [phone]   Fax:  [phone]  Physical Therapy Treatment  Patient Details  Name: Mariah Reeves MRN: [id number] Date of Birth: 1965/10/14 Referring Provider (PT): Dr. Mack Guise   Encounter Date: 11/25/2019   PT End of Session - 11/26/19 0832    Visit Number 17    Number of Visits 24    Date for PT Re-Evaluation 12/18/19    Authorization - Visit Number 2    Authorization - Number of Visits 10    PT Start Time 9937    PT Stop Time 1204    PT Time Calculation (min) 43 min    Activity Tolerance Patient tolerated treatment well    Behavior During Therapy [facility] for tasks assessed/performed           Past Medical History:  Diagnosis Date   Allergy    Anomaly, uterus    tumor of uterus   Anxiety    Bilateral occipital neuralgia    Bipolar 1 disorder ([place])    Chronic back pain    Complication of anesthesia    hard time waking up after gastric bypass.Pt stated that she has a small mouth and has had several surgeries since gastric bypass with no problems.   Degenerative disc disease, lumbar    Depression    Facet syndrome, lumbar    Family history of breast cancer    2/21 cancer genetic testing letter sent   GERD (gastroesophageal reflux disease)    before gastric bypass   Headache    Migraines   History of kidney stones    Left   Hx of vertigo    Patella fracture    Left   PTSD (post-traumatic stress disorder)    Sleep apnea    C-PAP   Wears glasses     Past Surgical History:  Procedure Laterality Date   ABDOMINOPLASTY  2006   CARPAL TUNNEL RELEASE Left    CHOLECYSTECTOMY     COLONOSCOPY W/ POLYPECTOMY     CYSTOSCOPY W/ URETERAL STENT PLACEMENT  01/03/2017   Procedure: CYSTOSCOPY WITH RETROGRADE PYELOGRAM/URETERAL STENT PLACEMENT;  Surgeon: Nickie Retort, MD;  Location: [facility];  Service: Urology;;   CYSTOSCOPY W/ URETERAL  STENT PLACEMENT Left 01/17/2017   Procedure: CYSTOSCOPY WITH STENT REPLACEMENT;  Surgeon: Nickie Retort, MD;  Location: [facility];  Service: Urology;  Laterality: Left;   DILATATION & CURETTAGE/HYSTEROSCOPY WITH MYOSURE N/A 07/08/2015   Procedure: DILATATION & CURETTAGE/HYSTEROSCOPY WITH MYOSURE;  Surgeon: Gae Dry, MD;  Location: [facility];  Service: Gynecology;  Laterality: N/A;   DILATION AND CURETTAGE OF UTERUS     1/17   EXTRACORPOREAL SHOCK WAVE LITHOTRIPSY Left 12/07/2016   Procedure: EXTRACORPOREAL SHOCK WAVE LITHOTRIPSY (ESWL);  Surgeon: Nickie Retort, MD;  Location: [facility];  Service: Urology;  Laterality: Left;   GASTRIC BYPASS  2005   KNEE SURGERY Right    ACL- repair   LITHOTRIPSY     MIDDLE EAR SURGERY     NASAL SEPTUM SURGERY     OVARY SURGERY Right    URETEROSCOPY WITH HOLMIUM LASER LITHOTRIPSY     perforated ureter. had nephrostomy tube for brief time   URETEROSCOPY WITH HOLMIUM LASER LITHOTRIPSY Left 01/17/2017   Procedure: URETEROSCOPY WITH HOLMIUM LASER LITHOTRIPSY;  Surgeon: Nickie Retort, MD;  Location: [facility];  Service: Urology;  Laterality: Left;    There were no vitals filed for this visit.  Subjective Assessment - 11/26/19 0830    Subjective No c/o L shoulder pain.  Pt. states she has been more active.    Pertinent History Pt. known well to PT.  See previous PT treatment notes.    Patient Stated Goals Increase L shoulder ROM/ pain mgmt.  Return to driving.    Currently in Pain? No/denies             There.ex.:  No UBE today Nautilus: lat. Pull down (handles) in seated 40#/ standing tricep extension 30#/ B sh. Adduction with handles 20#/  scap. Retraction with handles 40# 30x each. Supine L shoulder AROM: flexion/ horizontal abduction/ serratus punches (no resistance) 10x2. Supine 2# chest press/ serratus punches/ horizontal sh. Abduction/ sh. Flexion 20x.   STM to L UT/ deltoid/ biceps/ triceps in supine position.  Decrease generalized tenderness.   Pt. Instructed to ice L sh. To prevent soreness.        PT Long Term Goals - 11/24/19 0904      PT LONG TERM GOAL #1   Title Pt will score at least 64 on FOTO to demonstrate increased functional mobility.    Baseline Initial FOTO: 40.  5/5: 41 (minimal changes).    Time 4    Period Weeks    Status Not Met    Target Date 12/18/19      PT LONG TERM GOAL #2   Title Pt will decrease worst pain as reported on NPRS by at least 3 points in order to demonstrate clinically significant reduction in pain.    Baseline Worst pain >5/10 L shoulder.  5/5: 4/10 L shoulder pain at worst    Time 4    Period Weeks    Status Partially Met    Target Date 12/18/19      PT LONG TERM GOAL #3   Title Pt. independent with HEP to increase L shoulder AROM to WNL (all planes) as compared to R shoulder to improve functional mobility.    Baseline L shoulder PROM: flexion (106 deg.), abduction (105 deg.), ER (28 deg.), IR (58 deg.).  10/15/19:  flexion (122 deg.), abduction (111 deg.), ER (60 deg.).    Time 4    Period Weeks    Status Achieved    Target Date 11/20/19      PT LONG TERM GOAL #4   Title Pt. able to lift a gallon of milk/ grocery bags with no L shoulder pain/ limitations.    Baseline No lifting with L shoulder at this time.    Time 4    Period Weeks    Status On-going    Target Date 12/18/19                 Plan - 11/26/19 1884    Clinical Impression Statement Excellent L shoulder AROM with all overhead reaching/ functional tasks.  Pt. more aware of posture/ head position during Nautilus/ resisted ther.ex.  Increase in weights/ resistance during UE strengthening ex. with no increase discomfort/issues.    Examination-Activity Limitations Bed Mobility;Bathing;Reach Overhead;Carry;Dressing;Sleep;Lift    Stability/Clinical Decision Making Evolving/Moderate complexity    Clinical Decision Making Moderate    Rehab Potential Good    PT Frequency 2x /  week    PT Duration 4 weeks    PT Treatment/Interventions ADLs/Self Care Home Management;Cryotherapy;Electrical Stimulation;Moist Heat;Therapeutic exercise;Therapeutic activities;Functional mobility training;Neuromuscular re-education;Patient/family education;Manual techniques;Passive range of motion;Scar mobilization    PT Next Visit Plan Increase L shoulder stability with pain-free functional tasks.    PT  Home Exercise Plan [id number]    Consulted and Agree with Plan of Care Patient           Patient will benefit from skilled therapeutic intervention in order to improve the following deficits and impairments:  Improper body mechanics, Pain, Decreased coordination, Decreased mobility, Decreased activity tolerance, Decreased endurance, Decreased range of motion, Decreased strength, Impaired UE functional use, Impaired perceived functional ability, Hypomobility, Impaired flexibility  Visit Diagnosis: S/P complete repair of rotator cuff  Acute pain of left shoulder  Limited range of motion of shoulder  Weakness of shoulder  Muscle weakness (generalized)     Problem List Patient Active Problem List   Diagnosis Date Noted   Nephrolithiasis 01/26/2017   Left ureteral stone 01/03/2017   Endometrial disorder 07/08/2015   Bilateral occipital neuralgia 03/02/2015   DDD (degenerative disc disease), lumbar 12/24/2014   Facet syndrome, lumbar 12/24/2014   Sacroiliac joint dysfunction 12/24/2014   Pura Spice, PT, DPT # [id number] 11/26/2019, 8:51 AM  [place] [address] [place], [place], [address] Phone: [phone]   Fax:  [phone]  Name: Mariah Reeves MRN: [id number] Date of Birth: 1965/06/19

## 2019-11-27 ENCOUNTER — Encounter: Payer: Self-pay | Admitting: Physical Therapy

## 2019-11-27 ENCOUNTER — Ambulatory Visit: Payer: Medicare Other | Admitting: Physical Therapy

## 2019-11-27 ENCOUNTER — Other Ambulatory Visit: Payer: Self-pay

## 2019-11-27 DIAGNOSIS — M25619 Stiffness of unspecified shoulder, not elsewhere classified: Secondary | ICD-10-CM

## 2019-11-27 DIAGNOSIS — R29898 Other symptoms and signs involving the musculoskeletal system: Secondary | ICD-10-CM

## 2019-11-27 DIAGNOSIS — M25512 Pain in left shoulder: Secondary | ICD-10-CM

## 2019-11-27 DIAGNOSIS — Z9889 Other specified postprocedural states: Secondary | ICD-10-CM | POA: Diagnosis not present

## 2019-11-27 DIAGNOSIS — M6281 Muscle weakness (generalized): Secondary | ICD-10-CM

## 2019-11-27 NOTE — Therapy (Addendum)
[facility] [address]. [place], [place], [address] Phone: [phone]   Fax:  [phone]  Physical Therapy Treatment  Patient Details  Name: Mariah Reeves MRN: [id number] Date of Birth: 28-Aug-1965 Referring Provider (PT): Dr. Mack Guise   Encounter Date: 11/27/2019   PT End of Session - 11/27/19 1816    Visit Number 18    Number of Visits 24    Date for PT Re-Evaluation 12/18/19    Authorization - Visit Number 3    Authorization - Number of Visits 10    PT Start Time 1122    PT Stop Time 1205    PT Time Calculation (min) 43 min    Activity Tolerance Patient tolerated treatment well    Behavior During Therapy [facility] for tasks assessed/performed           Past Medical History:  Diagnosis Date  . Allergy   . Anomaly, uterus    tumor of uterus  . Anxiety   . Bilateral occipital neuralgia   . Bipolar 1 disorder ([place])   . Chronic back pain   . Complication of anesthesia    hard time waking up after gastric bypass.Pt stated that she has a small mouth and has had several surgeries since gastric bypass with no problems.  . Degenerative disc disease, lumbar   . Depression   . Facet syndrome, lumbar   . Family history of breast cancer    2/21 cancer genetic testing letter sent  . GERD (gastroesophageal reflux disease)    before gastric bypass  . Headache    Migraines  . History of kidney stones    Left  . Hx of vertigo   . Patella fracture    Left  . PTSD (post-traumatic stress disorder)   . Sleep apnea    C-PAP  . Wears glasses     Past Surgical History:  Procedure Laterality Date  . ABDOMINOPLASTY  2006  . CARPAL TUNNEL RELEASE Left   . CHOLECYSTECTOMY    . COLONOSCOPY W/ POLYPECTOMY    . CYSTOSCOPY W/ URETERAL STENT PLACEMENT  01/03/2017   Procedure: CYSTOSCOPY WITH RETROGRADE PYELOGRAM/URETERAL STENT PLACEMENT;  Surgeon: Nickie Retort, MD;  Location: [facility];  Service: Urology;;  . Consuela Mimes W/ URETERAL  STENT PLACEMENT Left 01/17/2017   Procedure: CYSTOSCOPY WITH STENT REPLACEMENT;  Surgeon: Nickie Retort, MD;  Location: [facility];  Service: Urology;  Laterality: Left;  . DILATATION & CURETTAGE/HYSTEROSCOPY WITH MYOSURE N/A 07/08/2015   Procedure: DILATATION & CURETTAGE/HYSTEROSCOPY WITH MYOSURE;  Surgeon: Gae Dry, MD;  Location: [facility];  Service: Gynecology;  Laterality: N/A;  . DILATION AND CURETTAGE OF UTERUS     1/17  . EXTRACORPOREAL SHOCK WAVE LITHOTRIPSY Left 12/07/2016   Procedure: EXTRACORPOREAL SHOCK WAVE LITHOTRIPSY (ESWL);  Surgeon: Nickie Retort, MD;  Location: [facility];  Service: Urology;  Laterality: Left;  Marland Kitchen GASTRIC BYPASS  2005  . KNEE SURGERY Right    ACL- repair  . LITHOTRIPSY    . MIDDLE EAR SURGERY    . NASAL SEPTUM SURGERY    . OVARY SURGERY Right   . URETEROSCOPY WITH HOLMIUM LASER LITHOTRIPSY     perforated ureter. had nephrostomy tube for brief time  . URETEROSCOPY WITH HOLMIUM LASER LITHOTRIPSY Left 01/17/2017   Procedure: URETEROSCOPY WITH HOLMIUM LASER LITHOTRIPSY;  Surgeon: Nickie Retort, MD;  Location: [facility];  Service: Urology;  Laterality: Left;    There were no vitals filed for this visit.  Pt. reports no new complaints. Pt. states she had some sorenss in shoulder with sidesleeping position.       There.ex.:  UBE: 3 min. F/b (warm-up)  Nautilus: lat. Pull down (handles) in seated 40#/ standing tricep extension 30#/ B sh. Adduction with handles 20#/ scap. Retraction with handles 40# 30xeach. Supine L shoulder AROM: flexion/ horizontal abduction/ serratus punches (no resistance) 10x2. Supine 2# chest press/ serratus punches/ horizontal sh. Abduction/ sh. Flexion 20x.   STM to L UT/ deltoid/ biceps/ triceps in supine position. Decrease generalized tenderness.   Pt. Instructed to ice L sh. To prevent soreness.          PT Long Term Goals - 11/24/19 0904      PT LONG TERM GOAL #1   Title Pt will score at  least 64 on FOTO to demonstrate increased functional mobility.    Baseline Initial FOTO: 40.  5/5: 41 (minimal changes).    Time 4    Period Weeks    Status Not Met    Target Date 12/18/19      PT LONG TERM GOAL #2   Title Pt will decrease worst pain as reported on NPRS by at least 3 points in order to demonstrate clinically significant reduction in pain.    Baseline Worst pain >5/10 L shoulder.  5/5: 4/10 L shoulder pain at worst    Time 4    Period Weeks    Status Partially Met    Target Date 12/18/19      PT LONG TERM GOAL #3   Title Pt. independent with HEP to increase L shoulder AROM to WNL (all planes) as compared to R shoulder to improve functional mobility.    Baseline L shoulder PROM: flexion (106 deg.), abduction (105 deg.), ER (28 deg.), IR (58 deg.).  10/15/19:  flexion (122 deg.), abduction (111 deg.), ER (60 deg.).    Time 4    Period Weeks    Status Achieved    Target Date 11/20/19      PT LONG TERM GOAL #4   Title Pt. able to lift a gallon of milk/ grocery bags with no L shoulder pain/ limitations.    Baseline No lifting with L shoulder at this time.    Time 4    Period Weeks    Status On-going    Target Date 12/18/19            Reassessment on L shoulder AROM: flexion 161 deg., abduction 145 deg., ER 56 deg., IR 75 deg. Grip strength: L 23.1#/ R 28.6#. L UE muscle strength grossly 4+/5 MMT except sh. abductino 4/5 MMT. Pt. will continue to benefit from strengthening ex. focus to maximize functional mobility at home.       Patient will benefit from skilled therapeutic intervention in order to improve the following deficits and impairments:     Visit Diagnosis: S/P complete repair of rotator cuff  Acute pain of left shoulder  Limited range of motion of shoulder  Weakness of shoulder  Muscle weakness (generalized)     Problem List Patient Active Problem List   Diagnosis Date Noted  . Nephrolithiasis 01/26/2017  . Left ureteral stone 01/03/2017   . Endometrial disorder 07/08/2015  . Bilateral occipital neuralgia 03/02/2015  . DDD (degenerative disc disease), lumbar 12/24/2014  . Facet syndrome, lumbar 12/24/2014  . Sacroiliac joint dysfunction 12/24/2014   Michael C Sherk, PT, DPT # [id number] 11/27/2019, 6:17 PM  [facility] [address] [place], [place], [address] Phone: [phone]   Fax:  [phone]  Name: Mariah Reeves MRN: [id number] Date of Birth: 07/23/1965   

## 2019-12-02 ENCOUNTER — Other Ambulatory Visit: Payer: Self-pay

## 2019-12-02 ENCOUNTER — Ambulatory Visit: Payer: Medicare Other | Admitting: Physical Therapy

## 2019-12-02 DIAGNOSIS — Z9889 Other specified postprocedural states: Secondary | ICD-10-CM | POA: Diagnosis not present

## 2019-12-02 DIAGNOSIS — M25512 Pain in left shoulder: Secondary | ICD-10-CM

## 2019-12-02 DIAGNOSIS — M25619 Stiffness of unspecified shoulder, not elsewhere classified: Secondary | ICD-10-CM

## 2019-12-02 DIAGNOSIS — M6281 Muscle weakness (generalized): Secondary | ICD-10-CM

## 2019-12-02 DIAGNOSIS — R29898 Other symptoms and signs involving the musculoskeletal system: Secondary | ICD-10-CM

## 2019-12-04 ENCOUNTER — Encounter: Payer: Self-pay | Admitting: Physical Therapy

## 2019-12-04 ENCOUNTER — Encounter: Payer: Medicare Other | Admitting: Physical Therapy

## 2019-12-05 NOTE — Therapy (Addendum)
[facility] [address] [place], [place], [address] Phone: [phone]   Fax:  [phone]  Physical Therapy Treatment  Patient Details  Name: Mariah Reeves MRN: [id number] Date of Birth: July 01, 1965 Referring Provider (PT): Dr. Mack Guise   Encounter Date: 12/02/2019     PT End of Session - 12/05/19 0659    Visit Number 19    Number of Visits 24    Date for PT Re-Evaluation 12/18/19    Authorization - Visit Number 4    Authorization - Number of Visits 10    PT Start Time 5170    PT Stop Time 1151    PT Time Calculation (min) 49 min    Activity Tolerance Patient tolerated treatment well    Behavior During Therapy [facility] for tasks assessed/performed           Past Medical History:  Diagnosis Date  . Allergy   . Anomaly, uterus    tumor of uterus  . Anxiety   . Bilateral occipital neuralgia   . Bipolar 1 disorder ([place])   . Chronic back pain   . Complication of anesthesia    hard time waking up after gastric bypass.Pt stated that she has a small mouth and has had several surgeries since gastric bypass with no problems.  . Degenerative disc disease, lumbar   . Depression   . Facet syndrome, lumbar   . Family history of breast cancer    2/21 cancer genetic testing letter sent  . GERD (gastroesophageal reflux disease)    before gastric bypass  . Headache    Migraines  . History of kidney stones    Left  . Hx of vertigo   . Patella fracture    Left  . PTSD (post-traumatic stress disorder)   . Sleep apnea    C-PAP  . Wears glasses     Past Surgical History:  Procedure Laterality Date  . ABDOMINOPLASTY  2006  . CARPAL TUNNEL RELEASE Left   . CHOLECYSTECTOMY    . COLONOSCOPY W/ POLYPECTOMY    . CYSTOSCOPY W/ URETERAL STENT PLACEMENT  01/03/2017   Procedure: CYSTOSCOPY WITH RETROGRADE PYELOGRAM/URETERAL STENT PLACEMENT;  Surgeon: Nickie Retort, MD;  Location: [facility];  Service: Urology;;  . Consuela Mimes W/  URETERAL STENT PLACEMENT Left 01/17/2017   Procedure: CYSTOSCOPY WITH STENT REPLACEMENT;  Surgeon: Nickie Retort, MD;  Location: [facility];  Service: Urology;  Laterality: Left;  . DILATATION & CURETTAGE/HYSTEROSCOPY WITH MYOSURE N/A 07/08/2015   Procedure: DILATATION & CURETTAGE/HYSTEROSCOPY WITH MYOSURE;  Surgeon: Gae Dry, MD;  Location: [facility];  Service: Gynecology;  Laterality: N/A;  . DILATION AND CURETTAGE OF UTERUS     1/17  . EXTRACORPOREAL SHOCK WAVE LITHOTRIPSY Left 12/07/2016   Procedure: EXTRACORPOREAL SHOCK WAVE LITHOTRIPSY (ESWL);  Surgeon: Nickie Retort, MD;  Location: [facility];  Service: Urology;  Laterality: Left;  Marland Kitchen GASTRIC BYPASS  2005  . KNEE SURGERY Right    ACL- repair  . LITHOTRIPSY    . MIDDLE EAR SURGERY    . NASAL SEPTUM SURGERY    . OVARY SURGERY Right   . URETEROSCOPY WITH HOLMIUM LASER LITHOTRIPSY     perforated ureter. had nephrostomy tube for brief time  . URETEROSCOPY WITH HOLMIUM LASER LITHOTRIPSY Left 01/17/2017   Procedure: URETEROSCOPY WITH HOLMIUM LASER LITHOTRIPSY;  Surgeon: Nickie Retort, MD;  Location: [facility];  Service: Urology;  Laterality: Left;    There were no vitals filed for  this visit.   Subjective Assessment - 12/04/19 1514    Pertinent History Pt. known well to PT.  See previous PT treatment notes.    Patient Stated Goals Increase L shoulder ROM/ pain mgmt.  Return to driving.               There.ex.:  UBE: 3 min. F/b (warm-up)  Nautilus: lat. Pull down(handles)in seated 40#/ standing tricep extension 30#/ B sh. Adduction with handles 20#/ scap. Retraction with handles40# 30xeach.  Supine L shoulder AROM: flexion/ horizontal abduction/ serratus punches (no resistance) 10x2.  Supine 2# chest press/ serratus punches/ horizontal sh. Abduction/ sh. Flexion 20x.  STM to L UT/ deltoid/ biceps/ triceps in supine position. Minimal tenderness/ no c/o pain.       PT Long Term Goals - 11/24/19  0904      PT LONG TERM GOAL #1   Title Pt will score at least 64 on FOTO to demonstrate increased functional mobility.    Baseline Initial FOTO: 40.  5/5: 41 (minimal changes).    Time 4    Period Weeks    Status Not Met    Target Date 12/18/19      PT LONG TERM GOAL #2   Title Pt will decrease worst pain as reported on NPRS by at least 3 points in order to demonstrate clinically significant reduction in pain.    Baseline Worst pain >5/10 L shoulder.  5/5: 4/10 L shoulder pain at worst    Time 4    Period Weeks    Status Partially Met    Target Date 12/18/19      PT LONG TERM GOAL #3   Title Pt. independent with HEP to increase L shoulder AROM to WNL (all planes) as compared to R shoulder to improve functional mobility.    Baseline L shoulder PROM: flexion (106 deg.), abduction (105 deg.), ER (28 deg.), IR (58 deg.).  10/15/19:  flexion (122 deg.), abduction (111 deg.), ER (60 deg.).    Time 4    Period Weeks    Status Achieved    Target Date 11/20/19      PT LONG TERM GOAL #4   Title Pt. able to lift a gallon of milk/ grocery bags with no L shoulder pain/ limitations.    Baseline No lifting with L shoulder at this time.    Time 4    Period Weeks    Status On-going    Target Date 12/18/19            Pt. doing well with minimal feedback for proper technique with resisted ther.ex./ Nautilus ex. Good L shoulder AROM and progressing well with resisted ther.ex. Pt. instructed to continue with HEP and avoid any pain provoking movements.       Patient will benefit from skilled therapeutic intervention in order to improve the following deficits and impairments:  Improper body mechanics,Pain,Decreased coordination,Decreased mobility,Decreased activity tolerance,Decreased endurance,Decreased range of motion,Decreased strength,Impaired UE functional use,Impaired perceived functional ability,Hypomobility,Impaired flexibility  Visit Diagnosis: S/P complete repair of rotator  cuff  Acute pain of left shoulder  Limited range of motion of shoulder  Weakness of shoulder  Muscle weakness (generalized)     Problem List Patient Active Problem List   Diagnosis Date Noted  . OSA on CPAP 04/19/2020  . CPAP use counseling 04/19/2020  . Nephrolithiasis 01/26/2017  . Left ureteral stone 01/03/2017  . Endometrial disorder 07/08/2015  . Bilateral occipital neuralgia 03/02/2015  . DDD (degenerative disc disease), lumbar 12/24/2014  .  Facet syndrome, lumbar 12/24/2014  . Sacroiliac joint dysfunction 12/24/2014   Pura Spice, PT, DPT # [phone] 12/04/2019, 7:56 PM  [facility] [address] [place], [place], [address] Phone: [phone]   Fax:  [phone]  Name: KAYLAN YATES MRN: [id number] Date of Birth: Mar 31, 1966

## 2019-12-09 ENCOUNTER — Other Ambulatory Visit: Payer: Self-pay

## 2019-12-09 ENCOUNTER — Ambulatory Visit: Payer: Medicare Other | Admitting: Physical Therapy

## 2019-12-09 ENCOUNTER — Encounter: Payer: Self-pay | Admitting: Physical Therapy

## 2019-12-09 DIAGNOSIS — R29898 Other symptoms and signs involving the musculoskeletal system: Secondary | ICD-10-CM

## 2019-12-09 DIAGNOSIS — M6281 Muscle weakness (generalized): Secondary | ICD-10-CM

## 2019-12-09 DIAGNOSIS — Z9889 Other specified postprocedural states: Secondary | ICD-10-CM | POA: Diagnosis not present

## 2019-12-09 DIAGNOSIS — M25512 Pain in left shoulder: Secondary | ICD-10-CM

## 2019-12-09 DIAGNOSIS — M25619 Stiffness of unspecified shoulder, not elsewhere classified: Secondary | ICD-10-CM

## 2019-12-10 NOTE — Therapy (Addendum)
[facility] [address] [place], [place], [address] Phone: [phone]   Fax:  [phone]  Physical Therapy Treatment  Patient Details  Name: CYLEE DATTILO MRN: [id number] Date of Birth: 1966-05-06 Referring Provider (PT): Dr. Mack Guise   Encounter Date: 12/09/2019     PT End of Session - 12/10/19 1511    Visit Number 20    Number of Visits 24    Date for PT Re-Evaluation 12/18/19    Authorization - Visit Number 5    Authorization - Number of Visits 10    PT Start Time 1055    PT Stop Time 1142    PT Time Calculation (min) 47 min    Activity Tolerance Patient tolerated treatment well    Behavior During Therapy [facility] for tasks assessed/performed           Past Medical History:  Diagnosis Date  . Allergy   . Anomaly, uterus    tumor of uterus  . Anxiety   . Bilateral occipital neuralgia   . Bipolar 1 disorder ([place])   . Chronic back pain   . Complication of anesthesia    hard time waking up after gastric bypass.Pt stated that she has a small mouth and has had several surgeries since gastric bypass with no problems.  . Degenerative disc disease, lumbar   . Depression   . Facet syndrome, lumbar   . Family history of breast cancer    2/21 cancer genetic testing letter sent  . GERD (gastroesophageal reflux disease)    before gastric bypass  . Headache    Migraines  . History of kidney stones    Left  . Hx of vertigo   . Patella fracture    Left  . PTSD (post-traumatic stress disorder)   . Sleep apnea    C-PAP  . Wears glasses     Past Surgical History:  Procedure Laterality Date  . ABDOMINOPLASTY  2006  . CARPAL TUNNEL RELEASE Left   . CHOLECYSTECTOMY    . COLONOSCOPY W/ POLYPECTOMY    . CYSTOSCOPY W/ URETERAL STENT PLACEMENT  01/03/2017   Procedure: CYSTOSCOPY WITH RETROGRADE PYELOGRAM/URETERAL STENT PLACEMENT;  Surgeon: Nickie Retort, MD;  Location: [facility];  Service: Urology;;  . Consuela Mimes W/  URETERAL STENT PLACEMENT Left 01/17/2017   Procedure: CYSTOSCOPY WITH STENT REPLACEMENT;  Surgeon: Nickie Retort, MD;  Location: [facility];  Service: Urology;  Laterality: Left;  . DILATATION & CURETTAGE/HYSTEROSCOPY WITH MYOSURE N/A 07/08/2015   Procedure: DILATATION & CURETTAGE/HYSTEROSCOPY WITH MYOSURE;  Surgeon: Gae Dry, MD;  Location: [facility];  Service: Gynecology;  Laterality: N/A;  . DILATION AND CURETTAGE OF UTERUS     1/17  . EXTRACORPOREAL SHOCK WAVE LITHOTRIPSY Left 12/07/2016   Procedure: EXTRACORPOREAL SHOCK WAVE LITHOTRIPSY (ESWL);  Surgeon: Nickie Retort, MD;  Location: [facility];  Service: Urology;  Laterality: Left;  Marland Kitchen GASTRIC BYPASS  2005  . KNEE SURGERY Right    ACL- repair  . LITHOTRIPSY    . MIDDLE EAR SURGERY    . NASAL SEPTUM SURGERY    . OVARY SURGERY Right   . URETEROSCOPY WITH HOLMIUM LASER LITHOTRIPSY     perforated ureter. had nephrostomy tube for brief time  . URETEROSCOPY WITH HOLMIUM LASER LITHOTRIPSY Left 01/17/2017   Procedure: URETEROSCOPY WITH HOLMIUM LASER LITHOTRIPSY;  Surgeon: Nickie Retort, MD;  Location: [facility];  Service: Urology;  Laterality: Left;    There were no vitals filed for  this visit.   Pt. states she was sleeping walking last night. Pt. will talk with PCP about issue.        There.ex.:  UBE: 3 min. F/b (warm-up)  Nautilus: lat. Pull down(handles)in seated 40#/ standing tricep extension 30#/ B sh. Adduction with handles 30# (increase resistance today)/ single handle sh. Abduction 10#/ scap. Retraction with handles40# 30xeach.  Minimal cuing to decrease R lateral trunk lean/ L UT activation during abduction.   Supine L shoulder AROM: flexion/ horizontal abduction/ serratus punches (no resistance) 10x2.  Supine 2# chest press/ serratus punches/ horizontal sh. Abduction/ sh. Flexion 20x.  STM to L UT/ deltoid/ biceps/ triceps in supine position. Minimal tenderness/ no c/o pain.      PT Long  Term Goals - 11/24/19 0904      PT LONG TERM GOAL #1   Title Pt will score at least 64 on FOTO to demonstrate increased functional mobility.    Baseline Initial FOTO: 40.  5/5: 41 (minimal changes).    Time 4    Period Weeks    Status Not Met    Target Date 12/18/19      PT LONG TERM GOAL #2   Title Pt will decrease worst pain as reported on NPRS by at least 3 points in order to demonstrate clinically significant reduction in pain.    Baseline Worst pain >5/10 L shoulder.  5/5: 4/10 L shoulder pain at worst    Time 4    Period Weeks    Status Partially Met    Target Date 12/18/19      PT LONG TERM GOAL #3   Title Pt. independent with HEP to increase L shoulder AROM to WNL (all planes) as compared to R shoulder to improve functional mobility.    Baseline L shoulder PROM: flexion (106 deg.), abduction (105 deg.), ER (28 deg.), IR (58 deg.).  10/15/19:  flexion (122 deg.), abduction (111 deg.), ER (60 deg.).    Time 4    Period Weeks    Status Achieved    Target Date 11/20/19      PT LONG TERM GOAL #4   Title Pt. able to lift a gallon of milk/ grocery bags with no L shoulder pain/ limitations.    Baseline No lifting with L shoulder at this time.    Time 4    Period Weeks    Status On-going    Target Date 12/18/19             Increase resistance with L shoulder add/abd. at Nautilus. No change in resistance with lat pull downs/ triceps/ chest press. Pt. will continue with UE resisted ex. 3x/week to increase stability/ pain-free overhead reaching.      Patient will benefit from skilled therapeutic intervention in order to improve the following deficits and impairments:  Improper body mechanics,Pain,Decreased coordination,Decreased mobility,Decreased activity tolerance,Decreased endurance,Decreased range of motion,Decreased strength,Impaired UE functional use,Impaired perceived functional ability,Hypomobility,Impaired flexibility  Visit Diagnosis: S/P complete repair of rotator  cuff  Acute pain of left shoulder  Limited range of motion of shoulder  Weakness of shoulder  Muscle weakness (generalized)     Problem List Patient Active Problem List   Diagnosis Date Noted  . OSA on CPAP 04/19/2020  . CPAP use counseling 04/19/2020  . Nephrolithiasis 01/26/2017  . Left ureteral stone 01/03/2017  . Endometrial disorder 07/08/2015  . Bilateral occipital neuralgia 03/02/2015  . DDD (degenerative disc disease), lumbar 12/24/2014  . Facet syndrome, lumbar 12/24/2014  . Sacroiliac joint  dysfunction 12/24/2014   Pura Spice, PT, DPT # [phone] 12/11/2019, 8:04 PM   [facility] [address] [place], [place], [address] Phone: [phone]   Fax:  [phone]  Name: DEANDA RUDDELL MRN: [id number] Date of Birth: [birth year]

## 2019-12-11 ENCOUNTER — Encounter: Payer: Medicare Other | Admitting: Physical Therapy

## 2019-12-17 ENCOUNTER — Other Ambulatory Visit: Payer: Self-pay

## 2019-12-17 ENCOUNTER — Ambulatory Visit: Payer: Medicare Other | Attending: Orthopedic Surgery | Admitting: Physical Therapy

## 2019-12-17 DIAGNOSIS — R29898 Other symptoms and signs involving the musculoskeletal system: Secondary | ICD-10-CM | POA: Diagnosis present

## 2019-12-17 DIAGNOSIS — M6281 Muscle weakness (generalized): Secondary | ICD-10-CM | POA: Insufficient documentation

## 2019-12-17 DIAGNOSIS — M25512 Pain in left shoulder: Secondary | ICD-10-CM | POA: Diagnosis present

## 2019-12-17 DIAGNOSIS — Z9889 Other specified postprocedural states: Secondary | ICD-10-CM | POA: Diagnosis not present

## 2019-12-17 DIAGNOSIS — M25619 Stiffness of unspecified shoulder, not elsewhere classified: Secondary | ICD-10-CM | POA: Diagnosis present

## 2019-12-18 NOTE — Therapy (Addendum)
[facility] [address] [place], [place], [address] Phone: [phone]   Fax:  [phone]  Physical Therapy Treatment  Patient Details  Name: Mariah Reeves MRN: [id number] Date of Birth: 1966/03/12 Referring Provider (PT): Dr. Mack Guise   Encounter Date: 12/17/2019    PT End of Session - 12/18/19 1557    Visit Number 21    Number of Visits 24    Date for PT Re-Evaluation 12/18/19    Authorization - Visit Number 6    Authorization - Number of Visits 10    PT Start Time 4235    PT Stop Time 1210    PT Time Calculation (min) 42 min    Activity Tolerance Patient tolerated treatment well    Behavior During Therapy [facility] for tasks assessed/performed             Past Medical History:  Diagnosis Date  . Allergy   . Anomaly, uterus    tumor of uterus  . Anxiety   . Bilateral occipital neuralgia   . Bipolar 1 disorder ([place])   . Chronic back pain   . Complication of anesthesia    hard time waking up after gastric bypass.Pt stated that she has a small mouth and has had several surgeries since gastric bypass with no problems.  . Degenerative disc disease, lumbar   . Depression   . Facet syndrome, lumbar   . Family history of breast cancer    2/21 cancer genetic testing letter sent  . GERD (gastroesophageal reflux disease)    before gastric bypass  . Headache    Migraines  . History of kidney stones    Left  . Hx of vertigo   . Patella fracture    Left  . PTSD (post-traumatic stress disorder)   . Sleep apnea    C-PAP  . Wears glasses     Past Surgical History:  Procedure Laterality Date  . ABDOMINOPLASTY  2006  . CARPAL TUNNEL RELEASE Left   . CHOLECYSTECTOMY    . COLONOSCOPY W/ POLYPECTOMY    . CYSTOSCOPY W/ URETERAL STENT PLACEMENT  01/03/2017   Procedure: CYSTOSCOPY WITH RETROGRADE PYELOGRAM/URETERAL STENT PLACEMENT;  Surgeon: Nickie Retort, MD;  Location: [facility];  Service: Urology;;  . Consuela Mimes W/  URETERAL STENT PLACEMENT Left 01/17/2017   Procedure: CYSTOSCOPY WITH STENT REPLACEMENT;  Surgeon: Nickie Retort, MD;  Location: [facility];  Service: Urology;  Laterality: Left;  . DILATATION & CURETTAGE/HYSTEROSCOPY WITH MYOSURE N/A 07/08/2015   Procedure: DILATATION & CURETTAGE/HYSTEROSCOPY WITH MYOSURE;  Surgeon: Gae Dry, MD;  Location: [facility];  Service: Gynecology;  Laterality: N/A;  . DILATION AND CURETTAGE OF UTERUS     1/17  . EXTRACORPOREAL SHOCK WAVE LITHOTRIPSY Left 12/07/2016   Procedure: EXTRACORPOREAL SHOCK WAVE LITHOTRIPSY (ESWL);  Surgeon: Nickie Retort, MD;  Location: [facility];  Service: Urology;  Laterality: Left;  Marland Kitchen GASTRIC BYPASS  2005  . KNEE SURGERY Right    ACL- repair  . LITHOTRIPSY    . MIDDLE EAR SURGERY    . NASAL SEPTUM SURGERY    . OVARY SURGERY Right   . ROTATOR CUFF REPAIR    . URETEROSCOPY WITH HOLMIUM LASER LITHOTRIPSY     perforated ureter. had nephrostomy tube for brief time  . URETEROSCOPY WITH HOLMIUM LASER LITHOTRIPSY Left 01/17/2017   Procedure: URETEROSCOPY WITH HOLMIUM LASER LITHOTRIPSY;  Surgeon: Nickie Retort, MD;  Location: [facility];  Service: Urology;  Laterality: Left;  There were no vitals filed for this visit.    Pt. reports no new complaints. No pain in L shoulder at this time. Pt. states she is getting better.          There.ex.:  Standing B shoulder AROM (all planes).  Pharmacist, hospital.    UBE: 3 min. F/b (warm-up)  Nautilus: lat. Pull down(handles)in seated 40#/ standing tricep extension 30#/ B sh. Adduction with handles 20#/ scap. Retraction with handles40# 30xeach.  Supine 3# chest press/ 3# serratus punches/ 2# horizontal sh. Abduction/ 2# sh. Flexion 20x.  STM to L UT/ deltoid/ biceps/ triceps in supine position. No mobs.      PT Long Term Goals - 11/24/19 0904      PT LONG TERM GOAL #1   Title Pt will score at least 64 on FOTO to demonstrate increased functional mobility.     Baseline Initial FOTO: 40.  5/5: 41 (minimal changes).    Time 4    Period Weeks    Status Not Met    Target Date 12/18/19      PT LONG TERM GOAL #2   Title Pt will decrease worst pain as reported on NPRS by at least 3 points in order to demonstrate clinically significant reduction in pain.    Baseline Worst pain >5/10 L shoulder.  5/5: 4/10 L shoulder pain at worst    Time 4    Period Weeks    Status Partially Met    Target Date 12/18/19      PT LONG TERM GOAL #3   Title Pt. independent with HEP to increase L shoulder AROM to WNL (all planes) as compared to R shoulder to improve functional mobility.    Baseline L shoulder PROM: flexion (106 deg.), abduction (105 deg.), ER (28 deg.), IR (58 deg.).  10/15/19:  flexion (122 deg.), abduction (111 deg.), ER (60 deg.).    Time 4    Period Weeks    Status Achieved    Target Date 11/20/19      PT LONG TERM GOAL #4   Title Pt. able to lift a gallon of milk/ grocery bags with no L shoulder pain/ limitations.    Baseline No lifting with L shoulder at this time.    Time 4    Period Weeks    Status On-going    Target Date 12/18/19             PT will reassess L shoulder goals next tx. session. Pt. reports compliance with HEP and completing household tasks/ driving with no L shoulder issues. Pt. still presents with L shoulder muscle weakness as compared to R shoulder.      Patient will benefit from skilled therapeutic intervention in order to improve the following deficits and impairments:  Improper body mechanics,Pain,Decreased coordination,Decreased mobility,Decreased activity tolerance,Decreased endurance,Decreased range of motion,Decreased strength,Impaired UE functional use,Impaired perceived functional ability,Hypomobility,Impaired flexibility  Visit Diagnosis: S/P complete repair of rotator cuff  Acute pain of left shoulder  Limited range of motion of shoulder  Weakness of shoulder  Muscle weakness  (generalized)     Problem List Patient Active Problem List   Diagnosis Date Noted  . OSA on CPAP 04/19/2020  . CPAP use counseling 04/19/2020  . Nephrolithiasis 01/26/2017  . Left ureteral stone 01/03/2017  . Endometrial disorder 07/08/2015  . Bilateral occipital neuralgia 03/02/2015  . DDD (degenerative disc disease), lumbar 12/24/2014  . Facet syndrome, lumbar 12/24/2014  . Sacroiliac joint dysfunction 12/24/2014   Pura Spice,  PT, DPT # [phone] 12/21/2019, 8:16 PM  [place] [facility] [address] [place], [place], [address] Phone: [phone]   Fax:  [phone]  Name: MALKY RUDZINSKI MRN: [id number] Date of Birth: 03-10-1966

## 2019-12-23 ENCOUNTER — Ambulatory Visit: Payer: Medicare Other | Admitting: Physical Therapy

## 2019-12-23 ENCOUNTER — Other Ambulatory Visit: Payer: Self-pay

## 2019-12-23 DIAGNOSIS — Z9889 Other specified postprocedural states: Secondary | ICD-10-CM | POA: Diagnosis not present

## 2019-12-23 DIAGNOSIS — R29898 Other symptoms and signs involving the musculoskeletal system: Secondary | ICD-10-CM

## 2019-12-23 DIAGNOSIS — M25619 Stiffness of unspecified shoulder, not elsewhere classified: Secondary | ICD-10-CM

## 2019-12-23 DIAGNOSIS — M6281 Muscle weakness (generalized): Secondary | ICD-10-CM

## 2019-12-23 DIAGNOSIS — M25512 Pain in left shoulder: Secondary | ICD-10-CM

## 2019-12-25 ENCOUNTER — Encounter: Payer: Medicare Other | Admitting: Physical Therapy

## 2019-12-26 NOTE — Therapy (Addendum)
[place] [facility] [address] [place], [place], [address] Phone: [phone]   Fax:  [phone]  Physical Therapy Treatment/Discharge  Patient Details  Name: Mariah Reeves MRN: [id number] Date of Birth: December 02, 1965 Referring Provider (PT): Dr. Martha Clan   Encounter Date: 12/23/2019   Treatment: 22 of 22.  Recert/Discharge: 12/23/2019 1119 to 1204   Past Medical History:  Diagnosis Date  . Allergy   . Anomaly, uterus    tumor of uterus  . Anxiety   . Bilateral occipital neuralgia   . Bipolar 1 disorder ([facility])   . Chronic back pain   . Complication of anesthesia    hard time waking up after gastric bypass.Pt stated that she has a small mouth and has had several surgeries since gastric bypass with no problems.  . Degenerative disc disease, lumbar   . Depression   . Facet syndrome, lumbar   . Family history of breast cancer    2/21 cancer genetic testing letter sent  . GERD (gastroesophageal reflux disease)    before gastric bypass  . Headache    Migraines  . History of kidney stones    Left  . Hx of vertigo   . Patella fracture    Left  . PTSD (post-traumatic stress disorder)   . Sleep apnea    C-PAP  . Wears glasses     Past Surgical History:  Procedure Laterality Date  . ABDOMINOPLASTY  2006  . CARPAL TUNNEL RELEASE Left   . CHOLECYSTECTOMY    . COLONOSCOPY W/ POLYPECTOMY    . CYSTOSCOPY W/ URETERAL STENT PLACEMENT  01/03/2017   Procedure: CYSTOSCOPY WITH RETROGRADE PYELOGRAM/URETERAL STENT PLACEMENT;  Surgeon: Hildred Laser, MD;  Location: [facility];  Service: Urology;;  . Bluford Kaufmann W/ URETERAL STENT PLACEMENT Left 01/17/2017   Procedure: CYSTOSCOPY WITH STENT REPLACEMENT;  Surgeon: Hildred Laser, MD;  Location: [facility];  Service: Urology;  Laterality: Left;  . DILATATION & CURETTAGE/HYSTEROSCOPY WITH MYOSURE N/A 07/08/2015   Procedure: DILATATION & CURETTAGE/HYSTEROSCOPY WITH MYOSURE;  Surgeon: Nadara Mustard, MD;  Location: [facility];  Service: Gynecology;  Laterality: N/A;  . DILATION AND CURETTAGE OF UTERUS     1/17  . EXTRACORPOREAL SHOCK WAVE LITHOTRIPSY Left 12/07/2016   Procedure: EXTRACORPOREAL SHOCK WAVE LITHOTRIPSY (ESWL);  Surgeon: Hildred Laser, MD;  Location: [facility];  Service: Urology;  Laterality: Left;  Marland Kitchen GASTRIC BYPASS  2005  . KNEE SURGERY Right    ACL- repair  . LITHOTRIPSY    . MIDDLE EAR SURGERY    . NASAL SEPTUM SURGERY    . OVARY SURGERY Right   . ROTATOR CUFF REPAIR    . URETEROSCOPY WITH HOLMIUM LASER LITHOTRIPSY     perforated ureter. had nephrostomy tube for brief time  . URETEROSCOPY WITH HOLMIUM LASER LITHOTRIPSY Left 01/17/2017   Procedure: URETEROSCOPY WITH HOLMIUM LASER LITHOTRIPSY;  Surgeon: Hildred Laser, MD;  Location: [facility];  Service: Urology;  Laterality: Left;    There were no vitals filed for this visit.    Pt. feel ready for discharge from PT. Pt. will continue with HEP and instructed to call PT if any issues or questions.    There.ex.:  Reviewed HEP  Nautilus: 40# lat. Pull downs/ scap. Retraction 30x each.  30# chest press/ B shoulder adduction with handles 30x.  Standing tricep 30# 30x.    Standing serratus punches at wall 20x.    Floor to waist box lifting/ carrying 10# box (proper  technique).    Goal reassessment (all met)      PT Long Term Goals - 12/26/19 2025      PT LONG TERM GOAL #1   Title Pt will score at least 64 on FOTO to demonstrate increased functional mobility.    Baseline Initial FOTO: 40.  5/5: 41 (minimal changes).  7/13: 72 (goal met)    Time 4    Period Weeks    Status Achieved    Target Date 12/23/19      PT LONG TERM GOAL #2   Title Pt will decrease worst pain as reported on NPRS by at least 3 points in order to demonstrate clinically significant reduction in pain.    Baseline Worst pain >5/10 L shoulder.  5/5: 4/10 L shoulder pain at worst.  7/13: 1/10 L shoulder pain    Time 4     Period Weeks    Status Achieved    Target Date 12/23/19      PT LONG TERM GOAL #3   Title Pt. independent with HEP to increase L shoulder AROM to WNL (all planes) as compared to R shoulder to improve functional mobility.    Baseline L shoulder PROM: flexion (106 deg.), abduction (105 deg.), ER (28 deg.), IR (58 deg.).  10/15/19:  flexion (122 deg.), abduction (111 deg.), ER (60 deg.).    Time 4    Period Weeks    Status Achieved      PT LONG TERM GOAL #4   Title Pt. able to lift a gallon of milk/ grocery bags with no L shoulder pain/ limitations.    Baseline No lifting with L shoulder at this time.  7/13: no pain    Time 4    Period Weeks    Status Achieved    Target Date 12/23/19             Discharge from PT at this time. Pt. understands HEP and will continue to progress with resisted therex. 3x/week on an independent basis. Pt. has c/o no L shoulder pain over past several weeks. See HEP.      Patient will benefit from skilled therapeutic intervention in order to improve the following deficits and impairments:  Improper body mechanics,Pain,Decreased coordination,Decreased mobility,Decreased activity tolerance,Decreased endurance,Decreased range of motion,Decreased strength,Impaired UE functional use,Impaired perceived functional ability,Hypomobility,Impaired flexibility  Visit Diagnosis: S/P complete repair of rotator cuff  Acute pain of left shoulder  Limited range of motion of shoulder  Weakness of shoulder  Muscle weakness (generalized)     Problem List Patient Active Problem List   Diagnosis Date Noted  . OSA on CPAP 04/19/2020  . CPAP use counseling 04/19/2020  . Nephrolithiasis 01/26/2017  . Left ureteral stone 01/03/2017  . Endometrial disorder 07/08/2015  . Bilateral occipital neuralgia 03/02/2015  . DDD (degenerative disc disease), lumbar 12/24/2014  . Facet syndrome, lumbar 12/24/2014  . Sacroiliac joint dysfunction 12/24/2014   Pura Spice,  PT, DPT # [phone] 12/26/2019, 8:29 PM  [place] [facility] [address] [place], [place], [address] Phone: [phone]   Fax:  [phone]  Name: Mariah Reeves MRN: [id number] Date of Birth: 02/27/1966

## 2019-12-30 ENCOUNTER — Encounter: Payer: Medicare Other | Admitting: Physical Therapy

## 2020-01-01 ENCOUNTER — Encounter: Payer: Medicare Other | Admitting: Physical Therapy

## 2020-01-06 ENCOUNTER — Encounter: Payer: Medicare Other | Admitting: Physical Therapy

## 2020-02-09 ENCOUNTER — Ambulatory Visit: Payer: Medicare Other | Admitting: Internal Medicine

## 2020-02-09 NOTE — Progress Notes (Signed)
No show

## 2020-02-23 ENCOUNTER — Ambulatory Visit (INDEPENDENT_AMBULATORY_CARE_PROVIDER_SITE_OTHER): Payer: Medicare Other | Admitting: Internal Medicine

## 2020-02-23 VITALS — BP 173/102 | HR 94 | Ht 60.0 in | Wt 180.0 lb

## 2020-02-23 DIAGNOSIS — G4733 Obstructive sleep apnea (adult) (pediatric): Secondary | ICD-10-CM | POA: Diagnosis not present

## 2020-02-23 DIAGNOSIS — K219 Gastro-esophageal reflux disease without esophagitis: Secondary | ICD-10-CM

## 2020-02-23 DIAGNOSIS — F513 Sleepwalking [somnambulism]: Secondary | ICD-10-CM

## 2020-02-23 DIAGNOSIS — Z9989 Dependence on other enabling machines and devices: Secondary | ICD-10-CM | POA: Diagnosis not present
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
## 2020-02-23 NOTE — Patient Instructions (Signed)

## 2020-03-12 ENCOUNTER — Telehealth: Payer: Self-pay

## 2020-03-12 NOTE — Telephone Encounter (Signed)
Pt aware.

## 2020-03-12 NOTE — Telephone Encounter (Signed)
Pt callig; was given estrogen cream samples and rx; p/u a refill a week ago and has now lost the tube; can she have a replacement?  [phone]

## 2020-03-12 NOTE — Telephone Encounter (Signed)
Make pt aware 1 sample of Premarin cream is at front desk. Thank you

## 2020-04-19 ENCOUNTER — Ambulatory Visit (INDEPENDENT_AMBULATORY_CARE_PROVIDER_SITE_OTHER): Payer: Medicare Other | Admitting: Internal Medicine

## 2020-04-19 DIAGNOSIS — Z7189 Other specified counseling: Secondary | ICD-10-CM | POA: Diagnosis not present

## 2020-04-19 DIAGNOSIS — G4733 Obstructive sleep apnea (adult) (pediatric): Secondary | ICD-10-CM | POA: Diagnosis not present

## 2020-04-19 DIAGNOSIS — Z9989 Dependence on other enabling machines and devices: Secondary | ICD-10-CM

## 2020-04-19 NOTE — Patient Instructions (Signed)

## 2020-04-19 NOTE — Progress Notes (Signed)
[facility] [address] [place], [place] [address]  Pulmonary Sleep Medicine   Office Visit Note  Patient Name: Mariah Reeves DOB: 12-Apr-1966 MRN [id number]    Chief Complaint: Obstructive Sleep Apnea visit  Brief History:  Mariah Reeves is seen today for follow up to discuss the results of her recent repeat studies demonstrating central and obstructive sleep apnea. BiPAP SV was titrated. The Epworth Sleepiness Score is 13 out of 24. She reported sleeping well in the laboratory with the BiPAP  ROS  General: (-) fever, (-) chills, (-) night sweat Nose and Sinuses: (-) nasal stuffiness or itchiness, (-) postnasal drip, (-) nosebleeds, (-) sinus trouble. Mouth and Throat: (-) sore throat, (-) hoarseness. Neck: (-) swollen glands, (-) enlarged thyroid, (-) neck pain. Respiratory: - cough, - shortness of breath, - wheezing. Neurologic: - numbness, - tingling. Psychiatric: - anxiety, - depression   Current Medication: Outpatient Encounter Medications as of 04/19/2020  Medication Sig   HYDROcodone-acetaminophen (NORCO) 10-325 MG tablet Take 1 tablet by mouth every 4 (four) hours as needed for moderate pain.    lamoTRIgine (LAMICTAL) 200 MG tablet Take 400 mg by mouth at bedtime.    No facility-administered encounter medications on file as of 04/19/2020.    Surgical History: Past Surgical History:  Procedure Laterality Date   ABDOMINOPLASTY  2006   CARPAL TUNNEL RELEASE Left    CHOLECYSTECTOMY     COLONOSCOPY W/ POLYPECTOMY     CYSTOSCOPY W/ URETERAL STENT PLACEMENT  01/03/2017   Procedure: CYSTOSCOPY WITH RETROGRADE PYELOGRAM/URETERAL STENT PLACEMENT;  Surgeon: Hildred Laser, MD;  Location: [facility];  Service: Urology;;   CYSTOSCOPY W/ URETERAL STENT PLACEMENT Left 01/17/2017   Procedure: CYSTOSCOPY WITH STENT REPLACEMENT;  Surgeon: Hildred Laser, MD;  Location: [facility];  Service: Urology;  Laterality: Left;   DILATATION & CURETTAGE/HYSTEROSCOPY WITH  MYOSURE N/A 07/08/2015   Procedure: DILATATION & CURETTAGE/HYSTEROSCOPY WITH MYOSURE;  Surgeon: Nadara Mustard, MD;  Location: [facility];  Service: Gynecology;  Laterality: N/A;   DILATION AND CURETTAGE OF UTERUS     1/17   EXTRACORPOREAL SHOCK WAVE LITHOTRIPSY Left 12/07/2016   Procedure: EXTRACORPOREAL SHOCK WAVE LITHOTRIPSY (ESWL);  Surgeon: Hildred Laser, MD;  Location: [facility];  Service: Urology;  Laterality: Left;   GASTRIC BYPASS  2005   KNEE SURGERY Right    ACL- repair   LITHOTRIPSY     MIDDLE EAR SURGERY     NASAL SEPTUM SURGERY     OVARY SURGERY Right    ROTATOR CUFF REPAIR     URETEROSCOPY WITH HOLMIUM LASER LITHOTRIPSY     perforated ureter. had nephrostomy tube for brief time   URETEROSCOPY WITH HOLMIUM LASER LITHOTRIPSY Left 01/17/2017   Procedure: URETEROSCOPY WITH HOLMIUM LASER LITHOTRIPSY;  Surgeon: Hildred Laser, MD;  Location: [facility];  Service: Urology;  Laterality: Left;    Medical History: Past Medical History:  Diagnosis Date   Allergy    Anomaly, uterus    tumor of uterus   Anxiety    Bilateral occipital neuralgia    Bipolar 1 disorder (HCC)    Chronic back pain    Complication of anesthesia    hard time waking up after gastric bypass.Pt stated that she has a small mouth and has had several surgeries since gastric bypass with no problems.   Degenerative disc disease, lumbar    Depression    Facet syndrome, lumbar    Family history of breast cancer    2/21 cancer genetic  testing letter sent   GERD (gastroesophageal reflux disease)    before gastric bypass   Headache    Migraines   History of kidney stones    Left   Hx of vertigo    Patella fracture    Left   PTSD (post-traumatic stress disorder)    Sleep apnea    C-PAP   Wears glasses     Family History: Non contributory to the present illness  Social History: Social History   Socioeconomic History   Marital status: Divorced    Spouse name:  Not on file   Number of children: Not on file   Years of education: Not on file   Highest education level: Not on file  Occupational History   Not on file  Tobacco Use   Smoking status: Never Smoker   Smokeless tobacco: Never Used  Vaping Use   Vaping Use: Never used  Substance and Sexual Activity   Alcohol use: No   Drug use: No   Sexual activity: Never    Birth control/protection: None  Other Topics Concern   Not on file  Social History Narrative   Not on file   Social Determinants of Health   Financial Resource Strain:    Difficulty of Paying Living Expenses: Not on file  Food Insecurity:    Worried About Programme researcher, broadcasting/film/video in the Last Year: Not on file   [employer] of Food in the Last Year: Not on file  Transportation Needs:    Lack of Transportation (Medical): Not on file   Lack of Transportation (Non-Medical): Not on file  Physical Activity:    Days of Exercise per Week: Not on file   Minutes of Exercise per Session: Not on file  Stress:    Feeling of Stress : Not on file  Social Connections:    Frequency of Communication with Friends and Family: Not on file   Frequency of Social Gatherings with Friends and Family: Not on file   Attends Religious Services: Not on file   Active Member of Clubs or Organizations: Not on file   Attends Banker Meetings: Not on file   Marital Status: Not on file  Intimate Partner Violence:    Fear of Current or Ex-Partner: Not on file   Emotionally Abused: Not on file   Physically Abused: Not on file   Sexually Abused: Not on file    Vital Signs: Blood pressure (!) 142/88, pulse 87, height 5' (1.524 m), weight 181 lb (82.1 kg), last menstrual period 12/01/2016, SpO2 96 %.  Examination: General Appearance: The patient is well-developed, well-nourished, and in no distress. Neck Circumference: 35 Skin: Gross inspection of skin unremarkable. Head: normocephalic, no gross  deformities. Eyes: no gross deformities noted. ENT: ears appear grossly normal Neurologic: Alert and oriented. No involuntary movements.    EPWORTH SLEEPINESS SCALE:  Scale:  (0)= no chance of dozing; (1)= slight chance of dozing; (2)= moderate chance of dozing; (3)= high chance of dozing  Chance  Situtation    Sitting and reading: 2    Watching TV: 1    Sitting Inactive in public: 1    As a passenger in car: 3      Lying down to rest: 3    Sitting and talking: 1    Sitting quielty after lunch: 1    In a car, stopped in traffic: 1   TOTAL SCORE:   13 out of 24    SLEEP STUDIES:  1.  PSG 03/18/20 AHI 37 58%central, SpO66min 87%   CPAP COMPLIANCE DATA:  Will be getting a replacement machine  LABS: No results found for this or any previous visit (from the past 2160 hour(s)).  Radiology: No results found.  Assessment and Plan: Patient Active Problem List   Diagnosis Date Noted   OSA on CPAP 04/19/2020   CPAP use counseling 04/19/2020   Nephrolithiasis 01/26/2017   Left ureteral stone 01/03/2017   Endometrial disorder 07/08/2015   Bilateral occipital neuralgia 03/02/2015   DDD (degenerative disc disease), lumbar 12/24/2014   Facet syndrome, lumbar 12/24/2014   Sacroiliac joint dysfunction 12/24/2014      The study results and treatment recommendations were reviewed with the patient. She is currently on CPAP but due to central apneas as well as obstructive she was titrated with BiPAP SV. This demonstrated very good control of her apnea. It is likely that changing to this machine will help minimize the sleepwalking as well. She is taking steps to keep herself safe if she was to have any episodes of sleepwalking.  1. CSA/OSA- BiPAP SV will be ordered. Follow up 30+ days after set up 2. CPAP couseling-Discussed importance of adequate CPAP use as well as proper care and cleaning techniques of machine and all supplies.  General Counseling: I have  discussed the findings of the evaluation and examination with Tali.  I have also discussed any further diagnostic evaluation thatmay be needed or ordered today. Brandon verbalizes understanding of the findings of todays visit. We also reviewed her medications today and discussed drug interactions and side effects including but not limited excessive drowsiness and altered mental states. We also discussed that there is always a risk not just to her but also people around her. she has been encouraged to call the office with any questions or concerns that should arise related to todays visit.  I have personally obtained a history, examined the patient, evaluated laboratory and imaging results, formulated the assessment and plan and placed orders.  This patient was seen by Theotis Burrow, AGNP-C in collaboration with Dr. Freda Munro as a part of collaborative care agreement.  Valentino Hue Sol Blazing, PhD, FAASM  Diplomate, American Board of Sleep Medicine    Yevonne Pax, MD [facility] Diplomate ABMS Pulmonary and Critical Care Medicine Sleep medicine

## 2020-05-18 ENCOUNTER — Other Ambulatory Visit: Payer: Self-pay | Admitting: Family Medicine

## 2020-05-18 DIAGNOSIS — Z1231 Encounter for screening mammogram for malignant neoplasm of breast: Secondary | ICD-10-CM

## 2021-01-04 NOTE — Progress Notes (Signed)
[facility] [place], [place] [address]  Pulmonary Sleep Medicine   Office Visit Note  Patient Name: Mariah Reeves DOB: Jul 21, 1965 MRN [id number]    Chief Complaint: Obstructive Sleep Apnea visit  Brief History:  Braylynn is seen today for follow up on BIPAP. The patient has a 1 year history of sleep apnea. Patient is using PAP nightly.  The patient feels rested after sleeping with PAP.  The patient reports benefiting from PAP use. Reported sleepiness is  improved and the Epworth Sleepiness Score is 3 out of 24. Patient states she feels like her mask is smothering her. She feels that it is too much air going through her nose.The patient doesn't  take naps. The compliance download shows 23% compliance with an average use time of 2.8 hours. The AHI is 4.2  The patient does not complain of limb movements disrupting sleep.  ROS  General: (-) fever, (-) chills, (-) night sweat Nose and Sinuses: (-) nasal stuffiness or itchiness, (-) postnasal drip, (-) nosebleeds, (-) sinus trouble. Mouth and Throat: (-) sore throat, (-) hoarseness. Neck: (-) swollen glands, (-) enlarged thyroid, (-) neck pain. Respiratory: - cough, - shortness of breath, - wheezing. Neurologic: - numbness, - tingling. Psychiatric: + anxiety, + depression   Current Medication: Outpatient Encounter Medications as of 01/05/2021  Medication Sig   HYDROcodone-acetaminophen (NORCO) 10-325 MG tablet Take 1 tablet by mouth every 4 (four) hours as needed for moderate pain.    lamoTRIgine (LAMICTAL) 200 MG tablet Take 400 mg by mouth at bedtime.    pantoprazole (PROTONIX) 40 MG tablet pantoprazole 40 mg tablet,delayed release  TAKE 1 TABLET BY MOUTH EVERY DAY   No facility-administered encounter medications on file as of 01/05/2021.    Surgical History: Past Surgical History:  Procedure Laterality Date   ABDOMINOPLASTY  2006   CARPAL TUNNEL RELEASE Left    CHOLECYSTECTOMY     COLONOSCOPY W/  POLYPECTOMY     CYSTOSCOPY W/ URETERAL STENT PLACEMENT  01/03/2017   Procedure: CYSTOSCOPY WITH RETROGRADE PYELOGRAM/URETERAL STENT PLACEMENT;  Surgeon: Nickie Retort, MD;  Location: [facility];  Service: Urology;;   CYSTOSCOPY W/ URETERAL STENT PLACEMENT Left 01/17/2017   Procedure: CYSTOSCOPY WITH STENT REPLACEMENT;  Surgeon: Nickie Retort, MD;  Location: [facility];  Service: Urology;  Laterality: Left;   DILATATION & CURETTAGE/HYSTEROSCOPY WITH MYOSURE N/A 07/08/2015   Procedure: DILATATION & CURETTAGE/HYSTEROSCOPY WITH MYOSURE;  Surgeon: Gae Dry, MD;  Location: [facility];  Service: Gynecology;  Laterality: N/A;   DILATION AND CURETTAGE OF UTERUS     1/17   EXTRACORPOREAL SHOCK WAVE LITHOTRIPSY Left 12/07/2016   Procedure: EXTRACORPOREAL SHOCK WAVE LITHOTRIPSY (ESWL);  Surgeon: Nickie Retort, MD;  Location: [facility];  Service: Urology;  Laterality: Left;   GASTRIC BYPASS  2005   KNEE SURGERY Right    ACL- repair   LITHOTRIPSY     MIDDLE EAR SURGERY     NASAL SEPTUM SURGERY     OVARY SURGERY Right    ROTATOR CUFF REPAIR     URETEROSCOPY WITH HOLMIUM LASER LITHOTRIPSY     perforated ureter. had nephrostomy tube for brief time   URETEROSCOPY WITH HOLMIUM LASER LITHOTRIPSY Left 01/17/2017   Procedure: URETEROSCOPY WITH HOLMIUM LASER LITHOTRIPSY;  Surgeon: Nickie Retort, MD;  Location: [facility];  Service: Urology;  Laterality: Left;    Medical History: Past Medical History:  Diagnosis Date   Allergy    Anomaly, uterus    tumor of  uterus   Anxiety    Bilateral occipital neuralgia    Bipolar 1 disorder (HCC)    Chronic back pain    Complication of anesthesia    hard time waking up after gastric bypass.Pt stated that she has a small mouth and has had several surgeries since gastric bypass with no problems.   Degenerative disc disease, lumbar    Depression    Facet syndrome, lumbar    Family history of breast cancer    2/21 cancer genetic testing letter sent    GERD (gastroesophageal reflux disease)    before gastric bypass   Headache    Migraines   History of kidney stones    Left   Hx of vertigo    Patella fracture    Left   PTSD (post-traumatic stress disorder)    Sleep apnea    C-PAP   Wears glasses     Family History: Non contributory to the present illness  Social History: Social History   Socioeconomic History   Marital status: Divorced    Spouse name: Not on file   Number of children: Not on file   Years of education: Not on file   Highest education level: Not on file  Occupational History   Not on file  Tobacco Use   Smoking status: Never   Smokeless tobacco: Never  Vaping Use   Vaping Use: Never used  Substance and Sexual Activity   Alcohol use: No   Drug use: No   Sexual activity: Never    Birth control/protection: None  Other Topics Concern   Not on file  Social History Narrative   Not on file   Social Determinants of Health   Financial Resource Strain: Not on file  Food Insecurity: Not on file  Transportation Needs: Not on file  Physical Activity: Not on file  Stress: Not on file  Social Connections: Not on file  Intimate Partner Violence: Not on file    Vital Signs: Blood pressure (!) 159/101, pulse 99, temperature 97.7 F (36.5 C), resp. rate 18, height 5' (1.524 m), weight 179 lb (81.2 kg), last menstrual period 12/01/2016, SpO2 97 %.  Examination: General Appearance: The patient is well-developed, well-nourished, and in no distress. Neck Circumference: 40.5 cm Skin: Gross inspection of skin unremarkable. Head: normocephalic, no gross deformities. Eyes: no gross deformities noted. ENT: ears appear grossly normal Neurologic: Alert and oriented. No involuntary movements.    EPWORTH SLEEPINESS SCALE:  Scale:  (0)= no chance of dozing; (1)= slight chance of dozing; (2)= moderate chance of dozing; (3)= high chance of dozing  Chance  Situtation    Sitting and reading: 0    Watching TV:  1    Sitting Inactive in public: 0    As a passenger in car: 3      Lying down to rest: 1    Sitting and talking: 0    Sitting quielty after lunch: 0    In a car, stopped in traffic: 0   TOTAL SCORE:   5 out of 24    SLEEP STUDIES:  PSG 03/18/21 AHI 37 Spo12min 87% CPAP 04/12/20   CPAP COMPLIANCE DATA:  Date Range: 11/01/20-12/30/20  Average Daily Use: 2.8 hours  Median Use: 2.4  Compliance for > 4 Hours: 23%   AHI: 4.2 respiratory events per hour  Days Used: 51/60  Mask Leak: 35.6  95th Percentile Pressure: BIPAP         LABS: No results found for this or  any previous visit (from the past 2160 hour(s)).  Radiology: No results found.  No results found.  No results found.    Assessment and Plan: Patient Active Problem List   Diagnosis Date Noted   OSA on CPAP 04/19/2020   CPAP use counseling 04/19/2020   Nephrolithiasis 01/26/2017   Left ureteral stone 01/03/2017   Endometrial disorder 07/08/2015   Bilateral occipital neuralgia 03/02/2015   DDD (degenerative disc disease), lumbar 12/24/2014   Facet syndrome, lumbar 12/24/2014   Sacroiliac joint dysfunction 12/24/2014      The patient does not tolerate PAP and reports some benefit from PAP use. The patient was also counselled on nightly use. The compliance is poor. The AHI is 4.2. pt has not met compliance and will do a medicare restart and schedule new bipap asv titration study   1. OSA on CPAP Will do a restart with new bipap asv titration  2. CPAP use counseling CPAP couseling-Discussed importance of adequate CPAP use as well as proper care and cleaning techniques of machine and all supplies.  3. Bipolar 1 disorder ([place]) Continue current medication and f/u with PCP.  4. Elevated BP without diagnosis of hypertension Follow up with PCP  5. Obesity (BMI 35.0-39.9 without comorbidity) Obesity Counseling: Had a lengthy discussion regarding patients BMI and weight issues. Patient  was instructed on portion control as well as increased activity. Also discussed caloric restrictions with trying to maintain intake less than 2000 Kcal. Discussions were made in accordance with the 5As of weight management. Simple actions such as not eating late and if able to, taking a walk is suggested.   General Counseling: I have discussed the findings of the evaluation and examination with Sophiana.  I have also discussed any further diagnostic evaluation thatmay be needed or ordered today. Arnecia verbalizes understanding of the findings of todays visit. We also reviewed her medications today and discussed drug interactions and side effects including but not limited excessive drowsiness and altered mental states. We also discussed that there is always a risk not just to her but also people around her. she has been encouraged to call the office with any questions or concerns that should arise related to todays visit.  No orders of the defined types were placed in this encounter.       I have personally obtained a history, examined the patient, evaluated laboratory and imaging results, formulated the assessment and plan and placed orders.  This patient was seen by Drema Dallas, PA-C in collaboration with Dr. Devona Konig as a part of collaborative care agreement.   Richelle Ito Saunders Glance, PhD, FAASM  Diplomate, American Board of Sleep Medicine    Allyne Gee, MD [facility] [place] Diplomate ABMS Pulmonary and Critical Care Medicine Sleep medicine

## 2021-01-05 ENCOUNTER — Ambulatory Visit (INDEPENDENT_AMBULATORY_CARE_PROVIDER_SITE_OTHER): Payer: Medicare Other | Admitting: Internal Medicine

## 2021-01-05 ENCOUNTER — Other Ambulatory Visit: Payer: Self-pay

## 2021-01-05 DIAGNOSIS — E669 Obesity, unspecified: Secondary | ICD-10-CM | POA: Diagnosis not present

## 2021-01-05 DIAGNOSIS — G4733 Obstructive sleep apnea (adult) (pediatric): Secondary | ICD-10-CM | POA: Diagnosis not present

## 2021-01-05 DIAGNOSIS — F319 Bipolar disorder, unspecified: Secondary | ICD-10-CM | POA: Diagnosis not present

## 2021-01-05 DIAGNOSIS — Z7189 Other specified counseling: Secondary | ICD-10-CM

## 2021-01-05 DIAGNOSIS — R030 Elevated blood-pressure reading, without diagnosis of hypertension: Secondary | ICD-10-CM

## 2021-01-05 DIAGNOSIS — Z9989 Dependence on other enabling machines and devices: Secondary | ICD-10-CM

## 2021-01-05 NOTE — Patient Instructions (Signed)

## 2021-03-19 IMAGING — MG DIGITAL SCREENING BILAT W/ TOMO W/ CAD
6 of 10 series · 6 of 30 positions shown · non-contrast
Comparison: Previous exam(s).

CLINICAL DATA: Screening.

EXAM:
DIGITAL SCREENING BILATERAL MAMMOGRAM WITH TOMO AND CAD

[L MLO synth-2D]
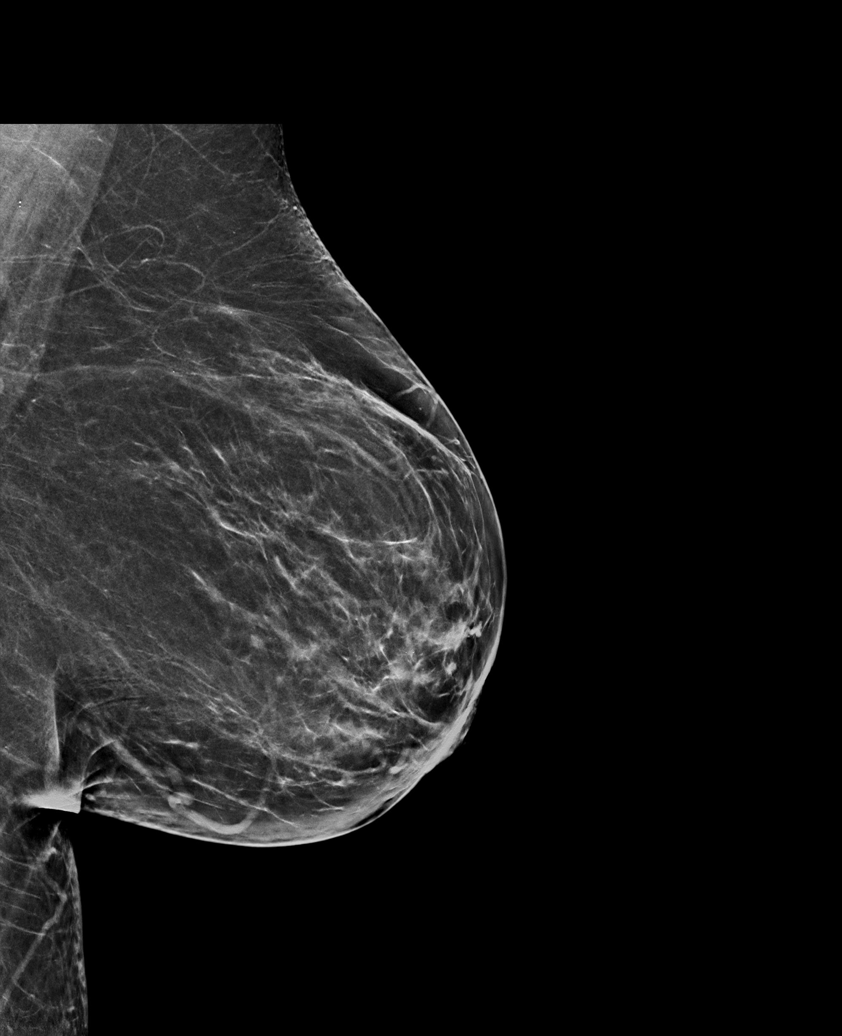

[L XCCL synth-2D]
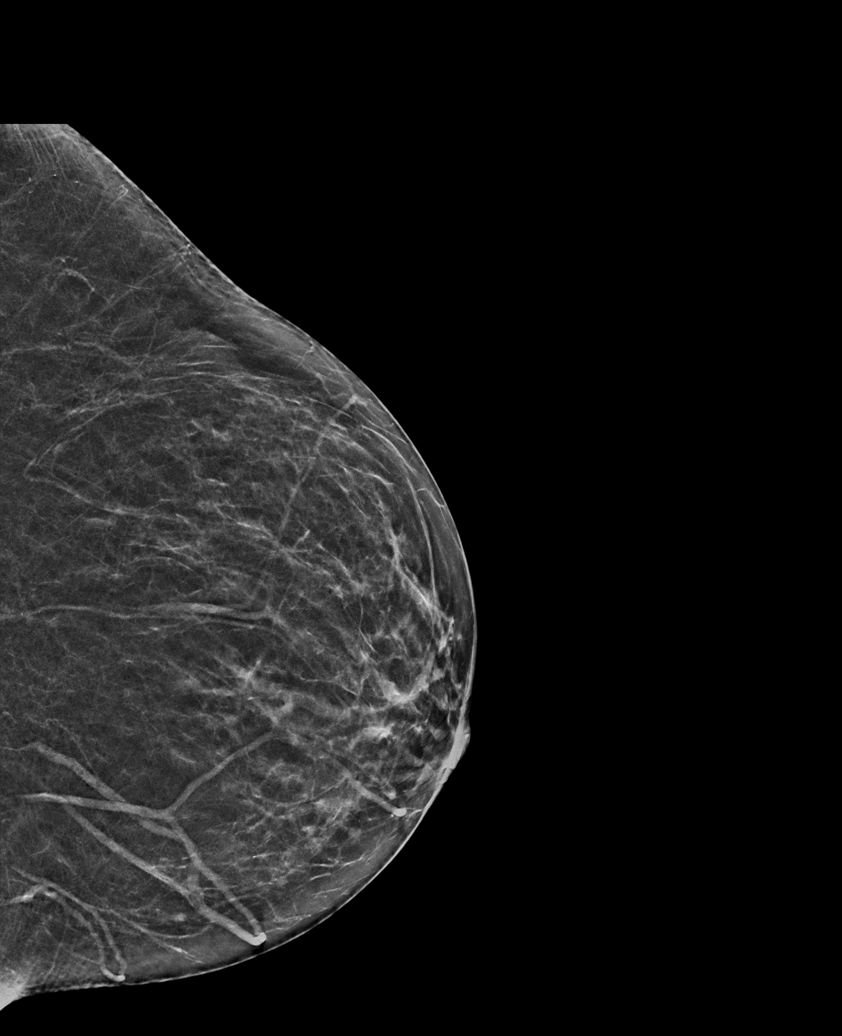

[L CC synth-2D]
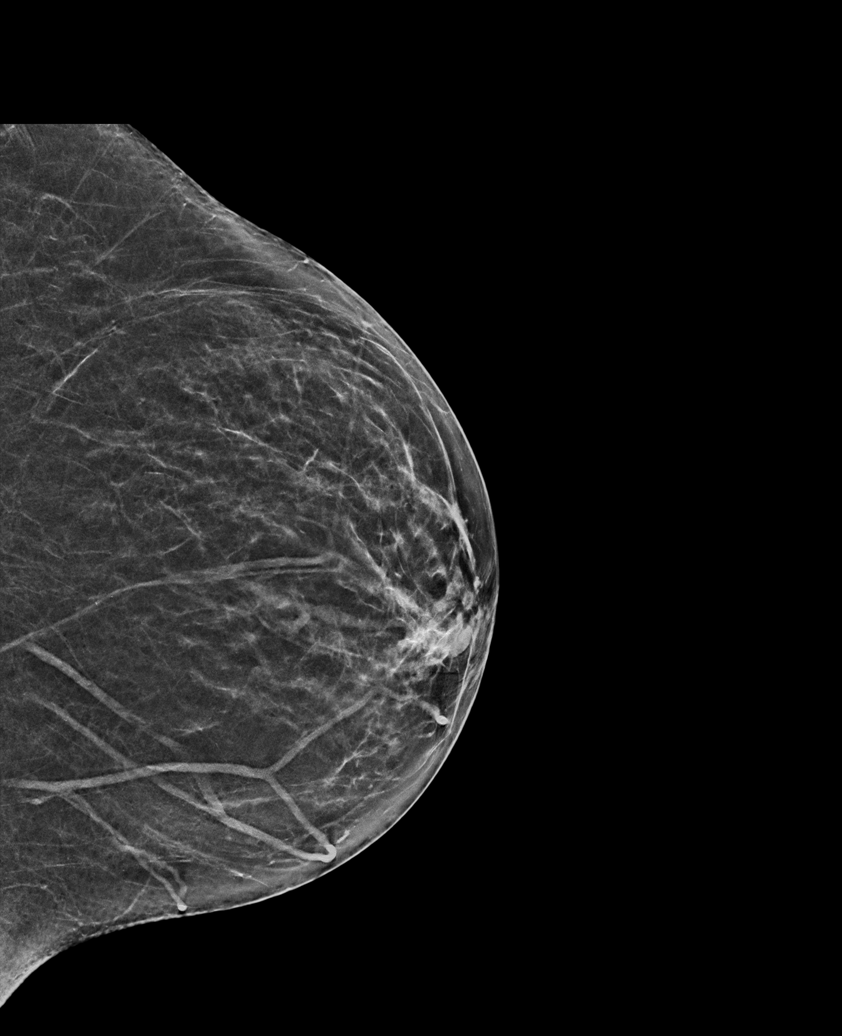

[R CC synth-2D]
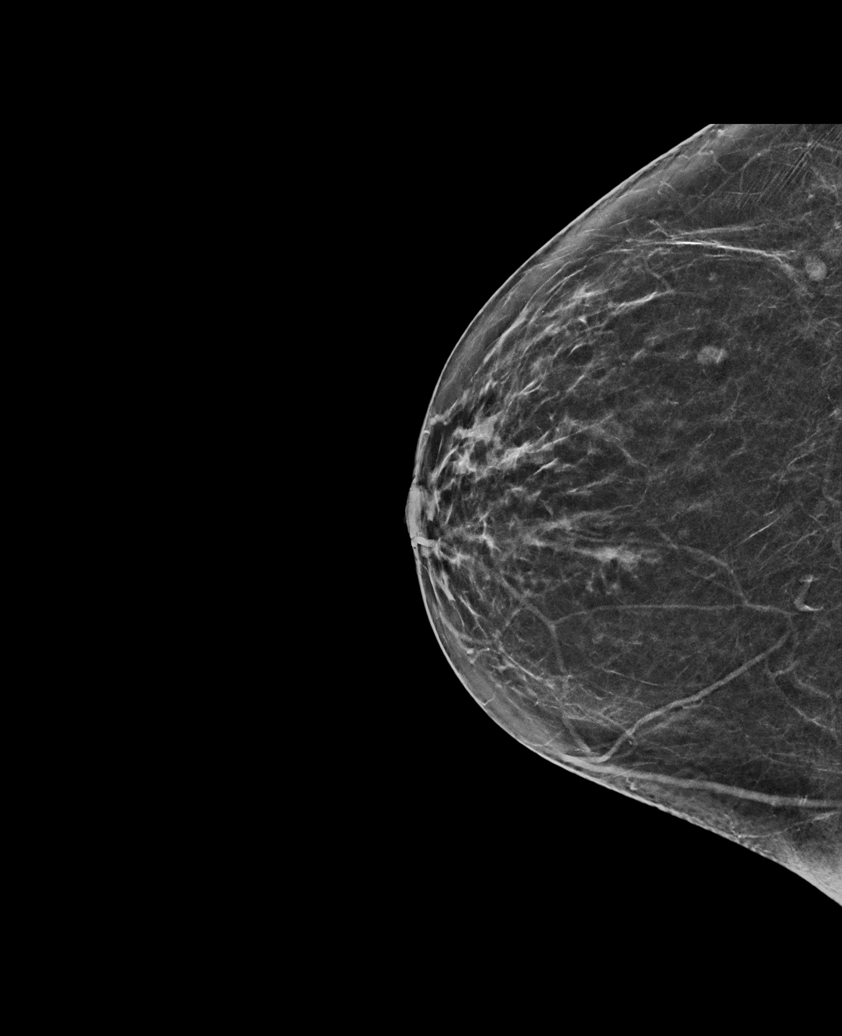

[R MLO synth-2D]
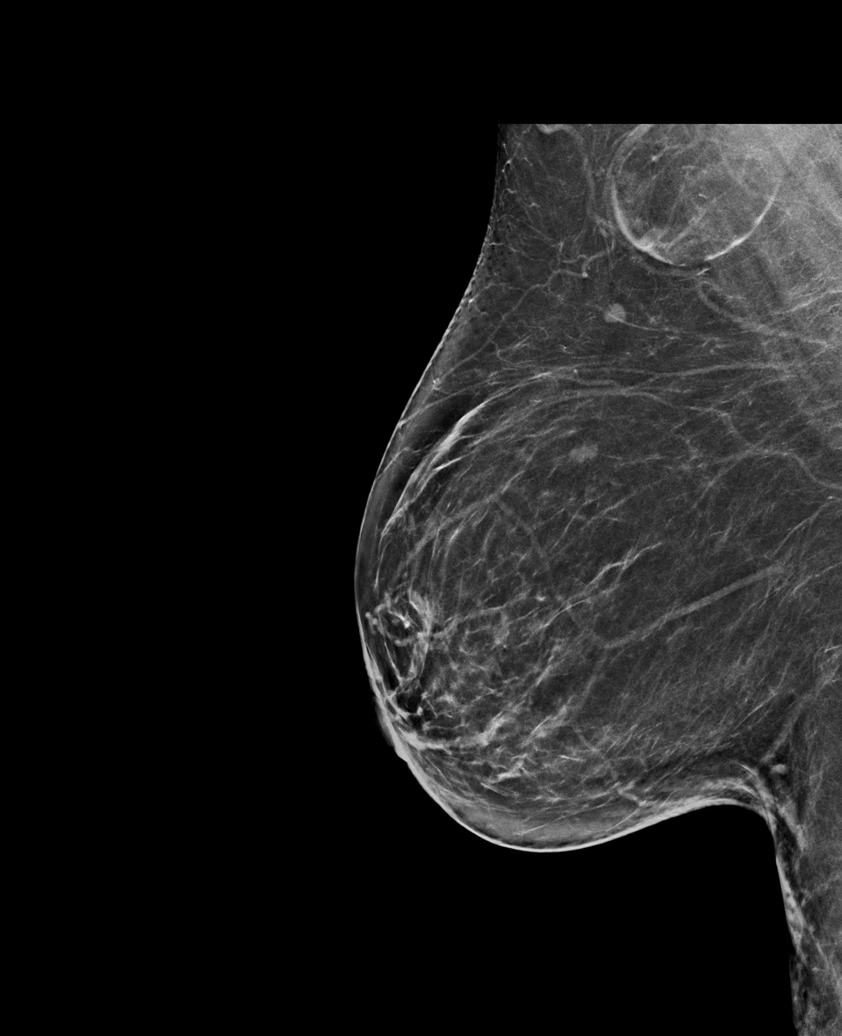

[L MLO tomo · tomo slice 35/70.0]
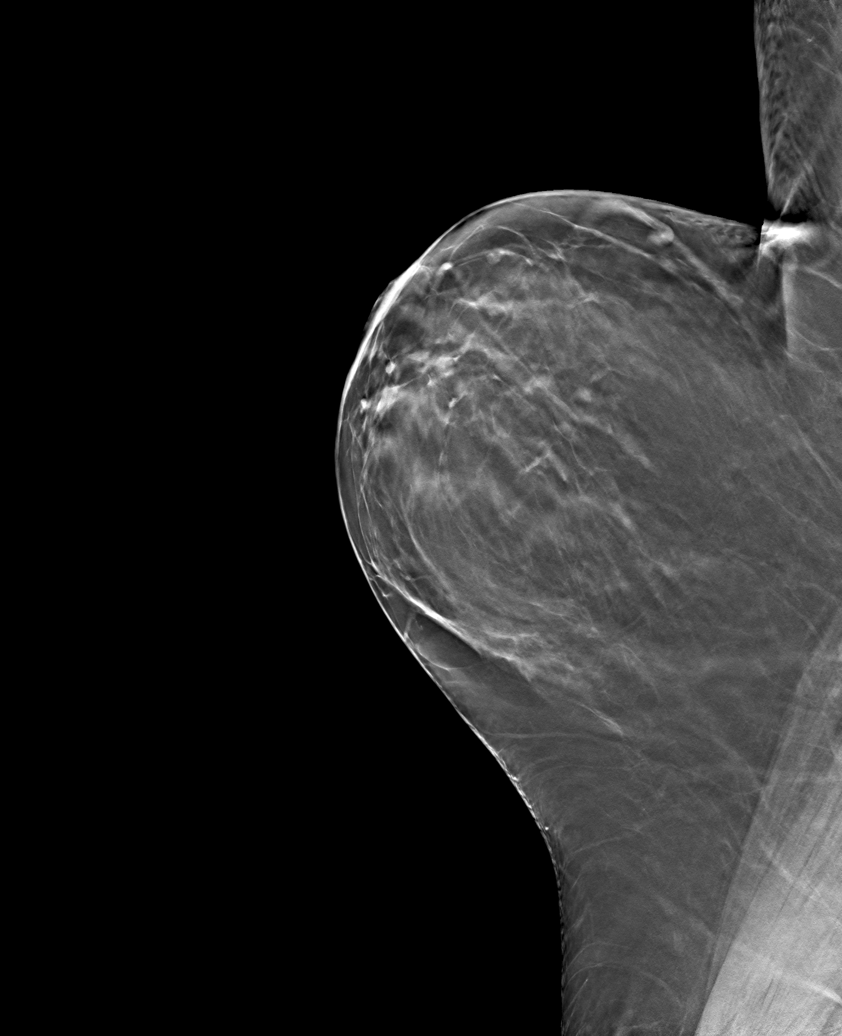

[6 of 30 positions shown; findings below may reference images not displayed]

ACR Breast Density Category b: There are scattered areas of
fibroglandular density.
FINDINGS: There are no findings suspicious for malignancy. Images were
processed with CAD.
IMPRESSION: No mammographic evidence of malignancy. A result letter of this
screening mammogram will be mailed directly to the patient.

RECOMMENDATION:
Screening mammogram in one year. (Code:[id number])

BI-RADS CATEGORY  1: Negative.

## 2021-05-27 NOTE — Progress Notes (Signed)
Patient was no-show for appointment.  The office staff will contact the patient for rescheduling follow-up. 

## 2021-05-30 ENCOUNTER — Ambulatory Visit: Payer: Medicare Other | Admitting: Internal Medicine

## 2021-06-12 HISTORY — PX: ROTATOR CUFF REPAIR: SHX139

## 2021-06-15 ENCOUNTER — Other Ambulatory Visit: Payer: Self-pay | Admitting: Obstetrics and Gynecology

## 2021-06-22 ENCOUNTER — Other Ambulatory Visit: Payer: Self-pay | Admitting: Obstetrics and Gynecology

## 2021-06-27 ENCOUNTER — Telehealth: Payer: Self-pay

## 2021-06-27 NOTE — Telephone Encounter (Signed)
Pt calling; has scheduled appointment 07/19/21 c [facility]; needs vaginal cream estradiol refilled; needs it now; is doing everything she can to get all she can out of current tube; is almost out and cannot wait for refill; if not able to give refill, can she have samples?  [phone]

## 2021-06-27 NOTE — Telephone Encounter (Signed)
Pt hasn't been seen in over 3 yrs. Not sure why AMS Rxd it without her being seen. Wait for his recommendation. Sent to him.

## 2021-07-04 NOTE — Telephone Encounter (Signed)
We haven't seen pt in almost 4 yrs. She states AMS diagnosed her but I dont' see any office notes. She can have PCP Rx in meantime or wait till 07/19/21 appt. A few wks off vag ERT is not harmful.

## 2021-07-05 NOTE — Telephone Encounter (Signed)
Sent pt a message in MyChart 

## 2021-07-19 ENCOUNTER — Other Ambulatory Visit: Payer: Self-pay

## 2021-07-19 ENCOUNTER — Ambulatory Visit (INDEPENDENT_AMBULATORY_CARE_PROVIDER_SITE_OTHER): Payer: Medicare Other | Admitting: Obstetrics and Gynecology

## 2021-07-19 ENCOUNTER — Encounter: Payer: Self-pay | Admitting: Obstetrics and Gynecology

## 2021-07-19 VITALS — BP 130/70 | Ht 60.0 in | Wt 183.0 lb

## 2021-07-19 DIAGNOSIS — Z1231 Encounter for screening mammogram for malignant neoplasm of breast: Secondary | ICD-10-CM

## 2021-07-19 DIAGNOSIS — Z01419 Encounter for gynecological examination (general) (routine) without abnormal findings: Secondary | ICD-10-CM

## 2021-07-19 DIAGNOSIS — N952 Postmenopausal atrophic vaginitis: Secondary | ICD-10-CM

## 2021-07-19 DIAGNOSIS — N6322 Unspecified lump in the left breast, upper inner quadrant: Secondary | ICD-10-CM | POA: Diagnosis not present

## 2021-07-19 DIAGNOSIS — Z803 Family history of malignant neoplasm of breast: Secondary | ICD-10-CM | POA: Diagnosis not present

## 2021-07-19 MED ORDER — ESTRADIOL 0.1 MG/GM VA CREA: 1.0000 | TOPICAL_CREAM | VAGINAL | 1 refills | Status: DC

## 2021-07-19 NOTE — Progress Notes (Signed)
PCP: Virl Cagey, MD   Chief Complaint  Patient presents with   Gynecologic Exam    No concerns    HPI:      [name] is a 56 y.o. G0P0000 whose LMP was Patient's last menstrual period was 12/01/2016., presents today for her NP> 3 yrs Medicare annual examination.  Her menses are absent due to menopause; PMB. She doesn't have vasomotor sx.   Sex activity: not sexually active. She does have vaginal dryness and uses vag ERT about quarterly when has vag itching with sores/pain. Uses 1 g daily for 3 days and then sx resolve. Has been out of Rx RF (not see in 4 yrs). Uses scented soap vaginally.   Last Pap: 07/30/17 Results were: no abnormalities /neg HPV DNA. No hx of abn paps.  Last mammogram: 06/18/19 Results were: normal--routine follow-up in 12 months There is a FH of breast cancer in her MGM and 2 mat grt aunts, genetic testing not done. Pt interested in past but was told not to do it. Pt now has medicare (may go off in a yr or so) so doesn't qualify. There is no FH of ovarian cancer. The patient does occas do self-breast exams.  Colonoscopy: 3/16 with polyp at [facility];  Repeat due after 10 years.   Tobacco use: The patient denies current or previous tobacco use. Alcohol use: none No drug use Exercise: moderately active  She does get adequate calcium and Vitamin D in her diet. S/p bariatric surg.   Labs with PCP.   Patient Active Problem List   Diagnosis Date Noted   Mass of upper inner quadrant of left breast 07/19/2021   Vaginal atrophy 07/19/2021   Family history of breast cancer 07/19/2021   OSA on CPAP 04/19/2020   CPAP use counseling 04/19/2020   Nephrolithiasis 01/26/2017   Left ureteral stone 01/03/2017   Endometrial disorder 07/08/2015   Bilateral occipital neuralgia 03/02/2015   DDD (degenerative disc disease), lumbar 12/24/2014   Facet syndrome, lumbar 12/24/2014   Sacroiliac joint dysfunction 12/24/2014    Past Surgical History:  Procedure  Laterality Date   ABDOMINOPLASTY  2006   CARPAL TUNNEL RELEASE Left    CHOLECYSTECTOMY     COLONOSCOPY W/ POLYPECTOMY     CYSTOSCOPY W/ URETERAL STENT PLACEMENT  01/03/2017   Procedure: CYSTOSCOPY WITH RETROGRADE PYELOGRAM/URETERAL STENT PLACEMENT;  Surgeon: Nickie Retort, MD;  Location: [facility];  Service: Urology;;   CYSTOSCOPY W/ URETERAL STENT PLACEMENT Left 01/17/2017   Procedure: CYSTOSCOPY WITH STENT REPLACEMENT;  Surgeon: Nickie Retort, MD;  Location: [facility];  Service: Urology;  Laterality: Left;   DILATATION & CURETTAGE/HYSTEROSCOPY WITH MYOSURE N/A 07/08/2015   Procedure: DILATATION & CURETTAGE/HYSTEROSCOPY WITH MYOSURE;  Surgeon: Gae Dry, MD;  Location: [facility];  Service: Gynecology;  Laterality: N/A;   DILATION AND CURETTAGE OF UTERUS     1/17   EXTRACORPOREAL SHOCK WAVE LITHOTRIPSY Left 12/07/2016   Procedure: EXTRACORPOREAL SHOCK WAVE LITHOTRIPSY (ESWL);  Surgeon: Nickie Retort, MD;  Location: [facility];  Service: Urology;  Laterality: Left;   GASTRIC BYPASS  2005   KNEE SURGERY Right    ACL- repair   LITHOTRIPSY     MIDDLE EAR SURGERY     NASAL SEPTUM SURGERY     OVARY SURGERY Right    ROTATOR CUFF REPAIR     URETEROSCOPY WITH HOLMIUM LASER LITHOTRIPSY     perforated ureter. had nephrostomy tube for brief time   URETEROSCOPY WITH  HOLMIUM LASER LITHOTRIPSY Left 01/17/2017   Procedure: URETEROSCOPY WITH HOLMIUM LASER LITHOTRIPSY;  Surgeon: Nickie Retort, MD;  Location: [facility];  Service: Urology;  Laterality: Left;    Family History  Problem Relation Age of Onset   Hypertension Mother    Diabetes Mother    Heart disease Father    Alcohol abuse Father    Breast cancer Maternal Grandmother        not sure of age   Breast cancer Other    Breast cancer Other    Kidney cancer Neg Hx    Bladder Cancer Neg Hx     Social History   Socioeconomic History   Marital status: Divorced    Spouse name: Not on file   Number of children: Not on  file   Years of education: Not on file   Highest education level: Not on file  Occupational History   Not on file  Tobacco Use   Smoking status: Never   Smokeless tobacco: Never  Vaping Use   Vaping Use: Never used  Substance and Sexual Activity   Alcohol use: No   Drug use: No   Sexual activity: Not Currently    Birth control/protection: None  Other Topics Concern   Not on file  Social History Narrative   Not on file   Social Determinants of Health   Financial Resource Strain: Not on file  Food Insecurity: Not on file  Transportation Needs: Not on file  Physical Activity: Not on file  Stress: Not on file  Social Connections: Not on file  Intimate Partner Violence: Not on file     Current Outpatient Medications:    HYDROcodone-acetaminophen (NORCO) 10-325 MG tablet, Take 1 tablet by mouth every 4 (four) hours as needed for moderate pain. , Disp: , Rfl:    lamoTRIgine (LAMICTAL) 200 MG tablet, Take 400 mg by mouth at bedtime. , Disp: , Rfl:    ondansetron (ZOFRAN-ODT) 4 MG disintegrating tablet, Take 4 mg by mouth every 8 (eight) hours as needed., Disp: , Rfl:    pantoprazole (PROTONIX) 40 MG tablet, pantoprazole 40 mg tablet,delayed release  TAKE 1 TABLET BY MOUTH EVERY DAY, Disp: , Rfl:    triamcinolone cream (KENALOG) 0.5 %, Apply topically 2 (two) times daily., Disp: , Rfl:    estradiol (ESTRACE) 0.1 MG/GM vaginal cream, Place 1 Applicatorful vaginally once a week., Disp: 42.5 g, Rfl: 1     ROS:  Review of Systems  Constitutional:  Negative for fatigue, fever and unexpected weight change.  Respiratory:  Negative for cough, shortness of breath and wheezing.   Cardiovascular:  Negative for chest pain, palpitations and leg swelling.  Gastrointestinal:  Negative for blood in stool, constipation, diarrhea, nausea and vomiting.  Endocrine: Negative for cold intolerance, heat intolerance and polyuria.  Genitourinary:  Positive for vaginal pain. Negative for dyspareunia,  dysuria, flank pain, frequency, genital sores, hematuria, menstrual problem, pelvic pain, urgency, vaginal bleeding and vaginal discharge.  Musculoskeletal:  Negative for back pain, joint swelling and myalgias.  Skin:  Negative for rash.  Neurological:  Negative for dizziness, syncope, light-headedness, numbness and headaches.  Hematological:  Negative for adenopathy.  Psychiatric/Behavioral:  Negative for agitation, confusion, sleep disturbance and suicidal ideas. The patient is not nervous/anxious.   BREAST: No symptoms    Objective: BP 130/70    Ht 5' (1.524 m)    Wt 183 lb (83 kg)    LMP 12/01/2016    BMI 35.74 kg/m    Physical  Exam Constitutional:      Appearance: She is well-developed.  Genitourinary:     Vulva normal.     Genitourinary Comments: BILAT LABIA MINORA WITH ERYTHEMA/MILD ATROPHY     Right Labia: rash.     Right Labia: No tenderness or lesions.    Left Labia: rash.     Left Labia: No tenderness or lesions.    No vaginal discharge, erythema or tenderness.     Moderate vaginal atrophy present.     Right Adnexa: not tender and no mass present.    Left Adnexa: not tender and no mass present.    No cervical friability or polyp.     Uterus is not enlarged or tender.  Breasts:    Right: No mass, nipple discharge, skin change or tenderness.     Left: Mass present. No nipple discharge, skin change or tenderness.  Neck:     Thyroid: No thyromegaly.  Cardiovascular:     Rate and Rhythm: Normal rate and regular rhythm.     Heart sounds: Normal heart sounds. No murmur heard. Pulmonary:     Effort: Pulmonary effort is normal.     Breath sounds: Normal breath sounds.  Chest:    Abdominal:     Palpations: Abdomen is soft.     Tenderness: There is no abdominal tenderness. There is no guarding or rebound.  Musculoskeletal:        General: Normal range of motion.     Cervical back: Normal range of motion.  Lymphadenopathy:     Cervical: No cervical adenopathy.   Neurological:     General: No focal deficit present.     Mental Status: She is alert and oriented to person, place, and time.     Cranial Nerves: No cranial nerve deficit.  Skin:    General: Skin is warm and dry.  Psychiatric:        Mood and Affect: Mood normal.        Behavior: Behavior normal.        Thought Content: Thought content normal.        Judgment: Judgment normal.  Vitals reviewed.    Assessment/Plan:  Encounter for annual routine gynecological examination  Encounter for screening mammogram for malignant neoplasm of breast - Plan: US BREAST LTD UNI RIGHT INC AXILLA, US BREAST LTD UNI LEFT INC AXILLA, MM DIAG BREAST TOMO BILATERAL,   Mass of upper inner quadrant of left breast - Plan: US BREAST LTD UNI RIGHT INC AXILLA, US BREAST LTD UNI LEFT INC AXILLA, MM DIAG BREAST TOMO BILATERAL; pt with LT breast mass; pt to [facility] and u/s. Will f/u with results.  Family history of breast cancer--MyRisk testing discussed, pt doesn't qualify with medicare. Does qualify if/when changes back to private insurance. Does qualify if breast mass malignant.   Vaginal atrophy - Plan: estradiol (ESTRACE) 0.1 MG/GM vaginal cream; Rx RF estrace crm. Recommended ext use wkly and 1 g monthly to help prevent flare, also change to dove sens skin soap. F/u prn.    Meds ordered this encounter  Medications   estradiol (ESTRACE) 0.1 MG/GM vaginal cream    Sig: Place 1 Applicatorful vaginally once a week.    Dispense:  42.5 g    Refill:  1    Order Specific Question:   Supervising Provider    Answer:   Gae Dry [id number]            GYN counsel breast self exam, mammography screening, menopause,  adequate intake of calcium and vitamin D, diet and exercise    F/U  Return in about 1 year (around 07/19/2022).  Diamon Reddinger B. Shirley Decamp, PA-C 07/19/2021 3:25 PM

## 2021-07-19 NOTE — Patient Instructions (Addendum)
I value your feedback and you entrusting us with your care. If you get a [facility] patient survey, I would appreciate you taking the time to let us know about your experience today. Thank you!  [facility]: [phone]      

## 2021-08-05 ENCOUNTER — Ambulatory Visit
Admission: RE | Admit: 2021-08-05 | Discharge: 2021-08-05 | Disposition: A | Discharge status: Home / Self Care | Payer: Medicare Other | Source: Ambulatory Visit | Attending: Obstetrics and Gynecology | Admitting: Obstetrics and Gynecology

## 2021-08-05 ENCOUNTER — Other Ambulatory Visit: Payer: Self-pay

## 2021-08-05 DIAGNOSIS — N6322 Unspecified lump in the left breast, upper inner quadrant: Secondary | ICD-10-CM

## 2021-08-05 DIAGNOSIS — Z1231 Encounter for screening mammogram for malignant neoplasm of breast: Secondary | ICD-10-CM | POA: Insufficient documentation

## 2021-08-06 ENCOUNTER — Encounter: Payer: Self-pay | Admitting: Obstetrics and Gynecology

## 2021-08-08 ENCOUNTER — Ambulatory Visit (INDEPENDENT_AMBULATORY_CARE_PROVIDER_SITE_OTHER): Payer: Medicare Other | Admitting: Internal Medicine

## 2021-08-08 VITALS — BP 147/98 | HR 98 | Resp 16 | Ht 60.0 in | Wt 183.0 lb

## 2021-08-08 DIAGNOSIS — G4730 Sleep apnea, unspecified: Secondary | ICD-10-CM

## 2021-08-08 DIAGNOSIS — G4731 Primary central sleep apnea: Secondary | ICD-10-CM

## 2021-08-08 DIAGNOSIS — Z7189 Other specified counseling: Secondary | ICD-10-CM

## 2021-08-08 HISTORY — DX: Sleep apnea, unspecified: G47.30

## 2021-08-08 NOTE — Patient Instructions (Signed)

## 2021-08-08 NOTE — Progress Notes (Signed)
[facility] [address], [place] [address]  Pulmonary Sleep Medicine   Office Visit Note  Patient Name: Mariah Reeves DOB: 06/06/1966 MRN [id number]    Chief Complaint: Obstructive Sleep Apnea visit  Brief History:  Mariah Reeves is seen today for follow up The patient has a 2 year history of sleep apnea. Patient is using PAP nightly.  The patient feels great after sleeping with PAP.  The patient reports benefiting from PAP use. Reported sleepiness is  resolved and the Epworth Sleepiness Score is 6 out of 24. The patient does not take naps. The patient complains of the following: nasal dryness in front of nose. And did  not use much when insomnia flared up approx 10 - 12 days ago and last for a week.  Patient said she thought it was due to increased hydrocodone, so she decreased it back down, it was resolved and she went back on her machine. She was sleep walking during that time, but didn't do anything weird like cooking.  She just woke up un in different rooms twice. The compliance download shows  compliance with an average use time of 5:56  hours @ 70%. The AHI is 6.2.  The patient does not complain of limb movements disrupting sleep.  ROS  General: (-) fever, (-) chills, (-) night sweat Nose and Sinuses: (-) nasal stuffiness or itchiness, (-) postnasal drip, (-) nosebleeds, (-) sinus trouble. Mouth and Throat: (-) sore throat, (-) hoarseness. Neck: (-) swollen glands, (-) enlarged thyroid, (-) neck pain. Respiratory: - cough, - shortness of breath, - wheezing. Neurologic: - numbness, - tingling. Psychiatric: - anxiety, - depression   Current Medication: Outpatient Encounter Medications as of 08/08/2021  Medication Sig   estradiol (ESTRACE) 0.1 MG/GM vaginal cream Place 1 Applicatorful vaginally once a week.   HYDROcodone-acetaminophen (NORCO) 10-325 MG tablet Take 1 tablet by mouth every 4 (four) hours as needed for moderate pain.    lamoTRIgine (LAMICTAL) 200 MG  tablet Take 400 mg by mouth at bedtime.    ondansetron (ZOFRAN-ODT) 4 MG disintegrating tablet Take 4 mg by mouth every 8 (eight) hours as needed.   pantoprazole (PROTONIX) 40 MG tablet pantoprazole 40 mg tablet,delayed release  TAKE 1 TABLET BY MOUTH EVERY DAY   triamcinolone cream (KENALOG) 0.5 % Apply topically 2 (two) times daily.   No facility-administered encounter medications on file as of 08/08/2021.    Surgical History: Past Surgical History:  Procedure Laterality Date   ABDOMINOPLASTY  2006   CARPAL TUNNEL RELEASE Left    CHOLECYSTECTOMY     COLONOSCOPY W/ POLYPECTOMY     CYSTOSCOPY W/ URETERAL STENT PLACEMENT  01/03/2017   Procedure: CYSTOSCOPY WITH RETROGRADE PYELOGRAM/URETERAL STENT PLACEMENT;  Surgeon: Nickie Retort, MD;  Location: [facility];  Service: Urology;;   CYSTOSCOPY W/ URETERAL STENT PLACEMENT Left 01/17/2017   Procedure: CYSTOSCOPY WITH STENT REPLACEMENT;  Surgeon: Nickie Retort, MD;  Location: [facility];  Service: Urology;  Laterality: Left;   DILATATION & CURETTAGE/HYSTEROSCOPY WITH MYOSURE N/A 07/08/2015   Procedure: DILATATION & CURETTAGE/HYSTEROSCOPY WITH MYOSURE;  Surgeon: Gae Dry, MD;  Location: [facility];  Service: Gynecology;  Laterality: N/A;   DILATION AND CURETTAGE OF UTERUS     1/17   EXTRACORPOREAL SHOCK WAVE LITHOTRIPSY Left 12/07/2016   Procedure: EXTRACORPOREAL SHOCK WAVE LITHOTRIPSY (ESWL);  Surgeon: Nickie Retort, MD;  Location: [facility];  Service: Urology;  Laterality: Left;   GASTRIC BYPASS  2005   KNEE SURGERY Right  ACL- repair   LITHOTRIPSY     MIDDLE EAR SURGERY     NASAL SEPTUM SURGERY     OVARY SURGERY Right    ROTATOR CUFF REPAIR     URETEROSCOPY WITH HOLMIUM LASER LITHOTRIPSY     perforated ureter. had nephrostomy tube for brief time   URETEROSCOPY WITH HOLMIUM LASER LITHOTRIPSY Left 01/17/2017   Procedure: URETEROSCOPY WITH HOLMIUM LASER LITHOTRIPSY;  Surgeon: Nickie Retort, MD;  Location: [facility];   Service: Urology;  Laterality: Left;    Medical History: Past Medical History:  Diagnosis Date   Allergy    Anomaly, uterus    tumor of uterus   Anxiety    Bilateral occipital neuralgia    Bipolar 1 disorder (HCC)    Chronic back pain    Complication of anesthesia    hard time waking up after gastric bypass.Pt stated that she has a small mouth and has had several surgeries since gastric bypass with no problems.   Degenerative disc disease, lumbar    Depression    Facet syndrome, lumbar    Family history of breast cancer    2/21 cancer genetic testing letter sent   GERD (gastroesophageal reflux disease)    before gastric bypass   Headache    Migraines   History of kidney stones    Left   Hx of vertigo    Patella fracture    Left   PTSD (post-traumatic stress disorder)    Sleep apnea    C-PAP   Wears glasses     Family History: Non contributory to the present illness  Social History: Social History   Socioeconomic History   Marital status: Divorced    Spouse name: Not on file   Number of children: Not on file   Years of education: Not on file   Highest education level: Not on file  Occupational History   Not on file  Tobacco Use   Smoking status: Never   Smokeless tobacco: Never  Vaping Use   Vaping Use: Never used  Substance and Sexual Activity   Alcohol use: No   Drug use: No   Sexual activity: Not Currently    Birth control/protection: None  Other Topics Concern   Not on file  Social History Narrative   Not on file   Social Determinants of Health   Financial Resource Strain: Not on file  Food Insecurity: Not on file  Transportation Needs: Not on file  Physical Activity: Not on file  Stress: Not on file  Social Connections: Not on file  Intimate Partner Violence: Not on file    Vital Signs: Last menstrual period 12/01/2016. There is no height or weight on file to calculate BMI.    Examination: General Appearance: The patient is  well-developed, well-nourished, and in no distress. Neck Circumference: 39 cm Skin: Gross inspection of skin unremarkable. Head: normocephalic, no gross deformities. Eyes: no gross deformities noted. ENT: ears appear grossly normal Neurologic: Alert and oriented. No involuntary movements.    EPWORTH SLEEPINESS SCALE:  Scale:  (0)= no chance of dozing; (1)= slight chance of dozing; (2)= moderate chance of dozing; (3)= high chance of dozing  Chance  Situtation    Sitting and reading: 1    Watching TV: 1    Sitting Inactive in public: 1    As a passenger in car: 2      Lying down to rest: 1    Sitting and talking: 0    Sitting quielty after  lunch: 0    In a car, stopped in traffic: 0   TOTAL SCORE:   6 out of 24    SLEEP STUDIES:  PSG 03/18/20 AHI 37 58%central, SpO37min 87%   CPAP COMPLIANCE DATA:  Date Range: 07/06/21  -  08/04/21  Average Daily Use: 5:56 hours  Median Use: 7:20 hours  Compliance for > 4 Hours: 70% days  AHI: 6.2 respiratory events per hour  Days Used: 30/30  Mask Leak: 42.3 lpm  95th Percentile Pressure: ASV Auto @ min/ max EPAP 6/15 cmH2O, min/max PS 6/15 cmH2O,rate - auto   LABS: No results found for this or any previous visit (from the past 2160 hour(s)).  Radiology: US BREAST LTD UNI LEFT INC AXILLA  Result Date: 08/05/2021 CLINICAL DATA:  56 year old with a possible palpable lump involving the UPPER INNER QUADRANT of the LEFT breast on recent clinical examination, though the patient does not feel a lump in this location. Annual evaluation, RIGHT breast. Family history of breast cancer in her maternal grandmother and in 2 maternal great aunts. EXAM: DIGITAL DIAGNOSTIC BILATERAL MAMMOGRAM WITH TOMOSYNTHESIS AND CAD; ULTRASOUND LEFT BREAST LIMITED TECHNIQUE: Bilateral digital diagnostic mammography and breast tomosynthesis was performed. The images were evaluated with computer-aided detection.; Targeted ultrasound examination of the  left breast was performed. COMPARISON:  Previous exam(s). ACR Breast Density Category b: There are scattered areas of fibroglandular density. FINDINGS: Full field CC and MLO views of both breasts were obtained. RIGHT: No findings suspicious for malignancy. LEFT: No findings suspicious for malignancy. No mammographic abnormalities in the UPPER INNER QUADRANT in the area of clinical concern. Targeted ultrasound is performed in the area of palpable concern, demonstrating normal fibrofatty and scattered fibroglandular tissue in the [place] at the 10-11 o'clock location. No cyst, solid mass or abnormal acoustic shadowing is identified. On correlative physical examination, the tissues of the UPPER INNER QUADRANT have a "lumpy bumpy" texture, though I do not palpate a discrete mass. IMPRESSION: 1. No mammographic or sonographic evidence of malignancy involving the LEFT breast. 2. No mammographic evidence of malignancy involving the RIGHT breast. RECOMMENDATION: Screening mammogram in one year.(Code:SM-B-01Y) I have discussed the findings and recommendations with the patient. If applicable, a reminder letter will be sent to the patient regarding the next appointment. BI-RADS CATEGORY  1: Negative. Electronically Signed   By: Evangeline Dakin M.D.   On: 08/05/2021 15:51  MM DIAG BREAST TOMO BILATERAL  Result Date: 08/05/2021 CLINICAL DATA:  56 year old with a possible palpable lump involving the UPPER INNER QUADRANT of the LEFT breast on recent clinical examination, though the patient does not feel a lump in this location. Annual evaluation, RIGHT breast. Family history of breast cancer in her maternal grandmother and in 2 maternal great aunts. EXAM: DIGITAL DIAGNOSTIC BILATERAL MAMMOGRAM WITH TOMOSYNTHESIS AND CAD; ULTRASOUND LEFT BREAST LIMITED TECHNIQUE: Bilateral digital diagnostic mammography and breast tomosynthesis was performed. The images were evaluated with computer-aided detection.; Targeted  ultrasound examination of the left breast was performed. COMPARISON:  Previous exam(s). ACR Breast Density Category b: There are scattered areas of fibroglandular density. FINDINGS: Full field CC and MLO views of both breasts were obtained. RIGHT: No findings suspicious for malignancy. LEFT: No findings suspicious for malignancy. No mammographic abnormalities in the UPPER INNER QUADRANT in the area of clinical concern. Targeted ultrasound is performed in the area of palpable concern, demonstrating normal fibrofatty and scattered fibroglandular tissue in the [place] at the 10-11 o'clock location. No cyst, solid mass or abnormal  acoustic shadowing is identified. On correlative physical examination, the tissues of the UPPER INNER QUADRANT have a "lumpy bumpy" texture, though I do not palpate a discrete mass. IMPRESSION: 1. No mammographic or sonographic evidence of malignancy involving the LEFT breast. 2. No mammographic evidence of malignancy involving the RIGHT breast. RECOMMENDATION: Screening mammogram in one year.(Code:SM-B-01Y) I have discussed the findings and recommendations with the patient. If applicable, a reminder letter will be sent to the patient regarding the next appointment. BI-RADS CATEGORY  1: Negative. Electronically Signed   By: Evangeline Dakin M.D.   On: 08/05/2021 15:51   US BREAST LTD UNI LEFT INC AXILLA  Result Date: 08/05/2021 CLINICAL DATA:  56 year old with a possible palpable lump involving the UPPER INNER QUADRANT of the LEFT breast on recent clinical examination, though the patient does not feel a lump in this location. Annual evaluation, RIGHT breast. Family history of breast cancer in her maternal grandmother and in 2 maternal great aunts. EXAM: DIGITAL DIAGNOSTIC BILATERAL MAMMOGRAM WITH TOMOSYNTHESIS AND CAD; ULTRASOUND LEFT BREAST LIMITED TECHNIQUE: Bilateral digital diagnostic mammography and breast tomosynthesis was performed. The images were evaluated with  computer-aided detection.; Targeted ultrasound examination of the left breast was performed. COMPARISON:  Previous exam(s). ACR Breast Density Category b: There are scattered areas of fibroglandular density. FINDINGS: Full field CC and MLO views of both breasts were obtained. RIGHT: No findings suspicious for malignancy. LEFT: No findings suspicious for malignancy. No mammographic abnormalities in the UPPER INNER QUADRANT in the area of clinical concern. Targeted ultrasound is performed in the area of palpable concern, demonstrating normal fibrofatty and scattered fibroglandular tissue in the [place] at the 10-11 o'clock location. No cyst, solid mass or abnormal acoustic shadowing is identified. On correlative physical examination, the tissues of the UPPER INNER QUADRANT have a "lumpy bumpy" texture, though I do not palpate a discrete mass. IMPRESSION: 1. No mammographic or sonographic evidence of malignancy involving the LEFT breast. 2. No mammographic evidence of malignancy involving the RIGHT breast. RECOMMENDATION: Screening mammogram in one year.(Code:SM-B-01Y) I have discussed the findings and recommendations with the patient. If applicable, a reminder letter will be sent to the patient regarding the next appointment. BI-RADS CATEGORY  1: Negative. Electronically Signed   By: Evangeline Dakin M.D.   On: 08/05/2021 15:51  MM DIAG BREAST TOMO BILATERAL  Result Date: 08/05/2021 CLINICAL DATA:  56 year old with a possible palpable lump involving the UPPER INNER QUADRANT of the LEFT breast on recent clinical examination, though the patient does not feel a lump in this location. Annual evaluation, RIGHT breast. Family history of breast cancer in her maternal grandmother and in 2 maternal great aunts. EXAM: DIGITAL DIAGNOSTIC BILATERAL MAMMOGRAM WITH TOMOSYNTHESIS AND CAD; ULTRASOUND LEFT BREAST LIMITED TECHNIQUE: Bilateral digital diagnostic mammography and breast tomosynthesis was performed. The  images were evaluated with computer-aided detection.; Targeted ultrasound examination of the left breast was performed. COMPARISON:  Previous exam(s). ACR Breast Density Category b: There are scattered areas of fibroglandular density. FINDINGS: Full field CC and MLO views of both breasts were obtained. RIGHT: No findings suspicious for malignancy. LEFT: No findings suspicious for malignancy. No mammographic abnormalities in the UPPER INNER QUADRANT in the area of clinical concern. Targeted ultrasound is performed in the area of palpable concern, demonstrating normal fibrofatty and scattered fibroglandular tissue in the [place] at the 10-11 o'clock location. No cyst, solid mass or abnormal acoustic shadowing is identified. On correlative physical examination, the tissues of the UPPER INNER QUADRANT have a "  lumpy bumpy" texture, though I do not palpate a discrete mass. IMPRESSION: 1. No mammographic or sonographic evidence of malignancy involving the LEFT breast. 2. No mammographic evidence of malignancy involving the RIGHT breast. RECOMMENDATION: Screening mammogram in one year.(Code:SM-B-01Y) I have discussed the findings and recommendations with the patient. If applicable, a reminder letter will be sent to the patient regarding the next appointment. BI-RADS CATEGORY  1: Negative. Electronically Signed   By: Evangeline Dakin M.D.   On: 08/05/2021 15:51   US BREAST LTD UNI LEFT INC AXILLA  Result Date: 08/05/2021 CLINICAL DATA:  56 year old with a possible palpable lump involving the UPPER INNER QUADRANT of the LEFT breast on recent clinical examination, though the patient does not feel a lump in this location. Annual evaluation, RIGHT breast. Family history of breast cancer in her maternal grandmother and in 2 maternal great aunts. EXAM: DIGITAL DIAGNOSTIC BILATERAL MAMMOGRAM WITH TOMOSYNTHESIS AND CAD; ULTRASOUND LEFT BREAST LIMITED TECHNIQUE: Bilateral digital diagnostic mammography and breast  tomosynthesis was performed. The images were evaluated with computer-aided detection.; Targeted ultrasound examination of the left breast was performed. COMPARISON:  Previous exam(s). ACR Breast Density Category b: There are scattered areas of fibroglandular density. FINDINGS: Full field CC and MLO views of both breasts were obtained. RIGHT: No findings suspicious for malignancy. LEFT: No findings suspicious for malignancy. No mammographic abnormalities in the UPPER INNER QUADRANT in the area of clinical concern. Targeted ultrasound is performed in the area of palpable concern, demonstrating normal fibrofatty and scattered fibroglandular tissue in the [place] at the 10-11 o'clock location. No cyst, solid mass or abnormal acoustic shadowing is identified. On correlative physical examination, the tissues of the UPPER INNER QUADRANT have a "lumpy bumpy" texture, though I do not palpate a discrete mass. IMPRESSION: 1. No mammographic or sonographic evidence of malignancy involving the LEFT breast. 2. No mammographic evidence of malignancy involving the RIGHT breast. RECOMMENDATION: Screening mammogram in one year.(Code:SM-B-01Y) I have discussed the findings and recommendations with the patient. If applicable, a reminder letter will be sent to the patient regarding the next appointment. BI-RADS CATEGORY  1: Negative. Electronically Signed   By: Evangeline Dakin M.D.   On: 08/05/2021 15:51  MM DIAG BREAST TOMO BILATERAL  Result Date: 08/05/2021 CLINICAL DATA:  56 year old with a possible palpable lump involving the UPPER INNER QUADRANT of the LEFT breast on recent clinical examination, though the patient does not feel a lump in this location. Annual evaluation, RIGHT breast. Family history of breast cancer in her maternal grandmother and in 2 maternal great aunts. EXAM: DIGITAL DIAGNOSTIC BILATERAL MAMMOGRAM WITH TOMOSYNTHESIS AND CAD; ULTRASOUND LEFT BREAST LIMITED TECHNIQUE: Bilateral digital  diagnostic mammography and breast tomosynthesis was performed. The images were evaluated with computer-aided detection.; Targeted ultrasound examination of the left breast was performed. COMPARISON:  Previous exam(s). ACR Breast Density Category b: There are scattered areas of fibroglandular density. FINDINGS: Full field CC and MLO views of both breasts were obtained. RIGHT: No findings suspicious for malignancy. LEFT: No findings suspicious for malignancy. No mammographic abnormalities in the UPPER INNER QUADRANT in the area of clinical concern. Targeted ultrasound is performed in the area of palpable concern, demonstrating normal fibrofatty and scattered fibroglandular tissue in the [place] at the 10-11 o'clock location. No cyst, solid mass or abnormal acoustic shadowing is identified. On correlative physical examination, the tissues of the UPPER INNER QUADRANT have a "lumpy bumpy" texture, though I do not palpate a discrete mass. IMPRESSION: 1. No mammographic or sonographic  evidence of malignancy involving the LEFT breast. 2. No mammographic evidence of malignancy involving the RIGHT breast. RECOMMENDATION: Screening mammogram in one year.(Code:[id number]-B-01Y) I have discussed the findings and recommendations with the patient. If applicable, a reminder letter will be sent to the patient regarding the next appointment. BI-RADS CATEGORY  1: Negative. Electronically Signed   By: Evangeline Dakin M.D.   On: 08/05/2021 15:51     Assessment and Plan: Patient Active Problem List   Diagnosis Date Noted   Mass of upper inner quadrant of left breast 07/19/2021   Vaginal atrophy 07/19/2021   Family history of breast cancer 07/19/2021   OSA on CPAP 04/19/2020   CPAP use counseling 04/19/2020   Nephrolithiasis 01/26/2017   Left ureteral stone 01/03/2017   Endometrial disorder 07/08/2015   Bilateral occipital neuralgia 03/02/2015   DDD (degenerative disc disease), lumbar 12/24/2014   Facet syndrome,  lumbar 12/24/2014   Sacroiliac joint dysfunction 12/24/2014    1. Complex sleep apnea syndrome The patient does tolerate PAP and reports  benefit from PAP use. The patient was reminded how to clean equipment and advised to replace supplies routinely. The patient was also counselled on weight loss. The compliance is excellent. The AHI is 6.2, but the patient has significant leak and I think this is likely causing this. We discussed going back to a full face mask and the patient is agreeable..   Complex sleep apnea- work on mask leak. Continue excellent compliance. F/u one year.   2. Encounter for BiPAP use counseling CPAP Counseling: had a lengthy discussion with the patient regarding the importance of PAP therapy in management of the sleep apnea. Patient appears to understand the risk factor reduction and also understands the risks associated with untreated sleep apnea. Patient will try to make a good faith effort to remain compliant with therapy. Also instructed the patient on proper cleaning of the device including the water must be changed daily if possible and use of distilled water is preferred. Patient understands that the machine should be regularly cleaned with appropriate recommended cleaning solutions that do not damage the PAP machine for example given white vinegar and water rinses. Other methods such as ozone treatment may not be as good as these simple methods to achieve cleaning.     General Counseling: I have discussed the findings of the evaluation and examination with [place].  I have also discussed any further diagnostic evaluation thatmay be needed or ordered today. Jinnifer verbalizes understanding of the findings of todays visit. We also reviewed her medications today and discussed drug interactions and side effects including but not limited excessive drowsiness and altered mental states. We also discussed that there is always a risk not just to her but also people around her. she has been  encouraged to call the office with any questions or concerns that should arise related to todays visit.  No orders of the defined types were placed in this encounter.       I have personally obtained a history, examined the patient, evaluated laboratory and imaging results, formulated the assessment and plan and placed orders. This patient was seen today by Tressie Ellis, PA-C in collaboration with Dr. Devona Konig.   Allyne Gee, MD [facility] Diplomate ABMS Pulmonary Critical Care Medicine and Sleep Medicine

## 2021-09-28 ENCOUNTER — Other Ambulatory Visit: Payer: Self-pay

## 2021-09-28 ENCOUNTER — Encounter: Payer: Self-pay | Admitting: *Deleted

## 2021-09-28 ENCOUNTER — Emergency Department (HOSPITAL_COMMUNITY): Payer: Medicare Other

## 2021-09-28 ENCOUNTER — Emergency Department: Payer: Medicare Other

## 2021-09-28 ENCOUNTER — Emergency Department
Admission: EM | Admit: 2021-09-28 | Discharge: 2021-09-28 | Disposition: A | Discharge status: Home / Self Care | Payer: Medicare Other | Attending: Emergency Medicine | Admitting: Emergency Medicine

## 2021-09-28 DIAGNOSIS — G43001 Migraine without aura, not intractable, with status migrainosus: Secondary | ICD-10-CM | POA: Diagnosis not present

## 2021-09-28 DIAGNOSIS — Z87442 Personal history of urinary calculi: Secondary | ICD-10-CM | POA: Diagnosis not present

## 2021-09-28 DIAGNOSIS — R519 Headache, unspecified: Secondary | ICD-10-CM | POA: Diagnosis present

## 2021-09-28 MED ORDER — BUTALBITAL-APAP-CAFFEINE 50-325-40 MG PO TABS: 1.0000 | ORAL_TABLET | Freq: Four times a day (QID) | ORAL | 0 refills | Status: AC | PRN

## 2021-09-28 MED ORDER — LACTATED RINGERS IV BOLUS
1000.0000 mL | Freq: Once | INTRAVENOUS | Status: AC
  Administered 2021-09-28: 1000 mL via INTRAVENOUS

## 2021-09-28 MED ORDER — BUTALBITAL-APAP-CAFFEINE 50-325-40 MG PO TABS
1.0000 | ORAL_TABLET | Freq: Once | ORAL | Status: AC
  Administered 2021-09-28: 1 via ORAL
  Filled 2021-09-28: qty 1

## 2021-09-28 MED ORDER — PROCHLORPERAZINE EDISYLATE 10 MG/2ML IJ SOLN
10.0000 mg | Freq: Once | INTRAMUSCULAR | Status: AC
  Administered 2021-09-28: 10 mg via INTRAVENOUS
  Filled 2021-09-28: qty 2

## 2021-09-28 MED ORDER — ONDANSETRON 4 MG PO TBDP: 4.0000 mg | ORAL_TABLET | Freq: Three times a day (TID) | ORAL | 0 refills | Status: DC | PRN

## 2021-09-28 NOTE — ED Triage Notes (Signed)
Pt has a headache for 3 days.  Pt has hx migraines.  Pt has not taken otc meds for h/a.  No n/v   pt alert speech clear.   ?

## 2021-09-28 NOTE — ED Notes (Signed)
Pt in CT.

## 2021-09-28 NOTE — ED Provider Notes (Signed)
? ?[facility] ?Provider Note ? ? ? Event Date/Time  ? First MD Initiated Contact with Patient 09/28/21 [id number]   ?  (approximate) ? ? ?History  ? ?Headache ? ? ?HPI ? ?Mariah Reeves is a 56 y.o. female with a history of migraine headaches who presents for evaluation of a headache.  Patient reports that she has had a constant migraine headache for the last 3 days.  Patient reports that she has not had a migraine in a very long time.  3 days ago she had a family member who got sick in [place].  She was awake for most of the night and receiving phone calls with updates and did not sleep very well.  The next day, she works as a Pharmacist, hospital, and had a lot of [employer] throughout the day and did not really eat much or drink much.  She feels that a combination of distress, not sleeping well, not eating or drinking well has led to this headache.  She describes the headache is progressively worse throughout the last 3 days, diffuse throbbing, 9 out of 10, associated with nausea and photophobia.  The headache is identical to her prior migraines.  She denies visual aura.  She denies neck stiffness, fever or chills, changes in vision, vomiting.  She has taken some Zofran at home.  She reports that she did not take anything else for the pain because she had gastric bypass surgery and she was told that she cannot take anything over-the-counter for pain. ?  ? ? ?Past Medical History:  ?Diagnosis Date  ? Allergy   ? Anomaly, uterus   ? tumor of uterus  ? Anxiety   ? Bilateral occipital neuralgia   ? Bipolar 1 disorder ([place])   ? Chronic back pain   ? Complication of anesthesia   ? hard time waking up after gastric bypass.Pt stated that she has a small mouth and has had several surgeries since gastric bypass with no problems.  ? Degenerative disc disease, lumbar   ? Depression   ? Facet syndrome, lumbar   ? Family history of breast cancer   ? 2/21 cancer genetic testing letter sent  ? GERD  (gastroesophageal reflux disease)   ? before gastric bypass  ? Headache   ? Migraines  ? History of kidney stones   ? Left  ? Hx of vertigo   ? Patella fracture   ? Left  ? PTSD (post-traumatic stress disorder)   ? Sleep apnea   ? C-PAP  ? Wears glasses   ? ? ?Past Surgical History:  ?Procedure Laterality Date  ? ABDOMINOPLASTY  2006  ? CARPAL TUNNEL RELEASE Left   ? CHOLECYSTECTOMY    ? COLONOSCOPY W/ POLYPECTOMY    ? CYSTOSCOPY W/ URETERAL STENT PLACEMENT  01/03/2017  ? Procedure: CYSTOSCOPY WITH RETROGRADE PYELOGRAM/URETERAL STENT PLACEMENT;  Surgeon: Nickie Retort, MD;  Location: [facility];  Service: Urology;;  ? CYSTOSCOPY W/ URETERAL STENT PLACEMENT Left 01/17/2017  ? Procedure: CYSTOSCOPY WITH STENT REPLACEMENT;  Surgeon: Nickie Retort, MD;  Location: [facility];  Service: Urology;  Laterality: Left;  ? DILATATION & CURETTAGE/HYSTEROSCOPY WITH MYOSURE N/A 07/08/2015  ? Procedure: DILATATION & CURETTAGE/HYSTEROSCOPY WITH MYOSURE;  Surgeon: Gae Dry, MD;  Location: [facility];  Service: Gynecology;  Laterality: N/A;  ? DILATION AND CURETTAGE OF UTERUS    ? 1/17  ? EXTRACORPOREAL SHOCK WAVE LITHOTRIPSY Left 12/07/2016  ? Procedure: EXTRACORPOREAL SHOCK WAVE LITHOTRIPSY (ESWL);  Surgeon: Pilar Jarvis,  Horald Pollen, MD;  Location: [facility];  Service: Urology;  Laterality: Left;  ? GASTRIC BYPASS  2005  ? KNEE SURGERY Right   ? ACL- repair  ? LITHOTRIPSY    ? MIDDLE EAR SURGERY    ? NASAL SEPTUM SURGERY    ? OVARY SURGERY Right   ? ROTATOR CUFF REPAIR    ? URETEROSCOPY WITH HOLMIUM LASER LITHOTRIPSY    ? perforated ureter. had nephrostomy tube for brief time  ? URETEROSCOPY WITH HOLMIUM LASER LITHOTRIPSY Left 01/17/2017  ? Procedure: URETEROSCOPY WITH HOLMIUM LASER LITHOTRIPSY;  Surgeon: Nickie Retort, MD;  Location: [facility];  Service: Urology;  Laterality: Left;  ? ? ? ?Physical Exam  ? ?Triage Vital Signs: ?ED Triage Vitals  ?Enc Vitals Group  ?   BP 09/28/21 0048 (!) 139/97  ?   Pulse Rate 09/28/21 0048  96  ?   Resp 09/28/21 0048 18  ?   Temp 09/28/21 0048 98.5 ?F (36.9 ?C)  ?   Temp Source 09/28/21 0048 Oral  ?   SpO2 09/28/21 0048 98 %  ?   Weight 09/28/21 0049 180 lb (81.6 kg)  ?   Height 09/28/21 0049 5' (1.524 m)  ?   Head Circumference --   ?   Peak Flow --   ?   Pain Score 09/28/21 0049 8  ?   Pain Loc --   ?   Pain Edu? --   ?   Excl. in [place]? --   ? ? ?Most recent vital signs: ?Vitals:  ? 09/28/21 0348 09/28/21 0451  ?BP: 122/75 130/77  ?Pulse: 87 77  ?Resp: 18 18  ?Temp: 98.4 ?F (36.9 ?C)   ?SpO2: 100% 98%  ? ? ? ?Constitutional: Alert and oriented. Well appearing and in no apparent distress. ?HEENT: ?     Head: Normocephalic and atraumatic.    ?     Eyes: Conjunctivae are normal. Sclera is non-icteric.  ?     Mouth/Throat: Mucous membranes are moist.  ?     Neck: Supple with no signs of meningismus. ?Cardiovascular: Regular rate and rhythm. No murmurs, gallops, or rubs. 2+ symmetrical distal pulses are present in all extremities.  ?Respiratory: Normal respiratory effort. Lungs are clear to auscultation bilaterally.  ?Gastrointestinal: Soft, non tender, and non distended with positive bowel sounds. No rebound or guarding. ?Genitourinary: No CVA tenderness. ?Musculoskeletal:  No edema, cyanosis, or erythema of extremities. ?Neurologic: Normal speech and language. Face is symmetric. Moving all extremities. No gross focal neurologic deficits are appreciated. EOMI, PERRL, intact strength and sensation x4, no dysmetria pronator drift ?Skin: Skin is warm, dry and intact. No rash noted. ?Psychiatric: Mood and affect are normal. Speech and behavior are normal. ? ?ED Results / Procedures / Treatments  ? ?Labs ?(all labs ordered are listed, but only abnormal results are displayed) ?Labs Reviewed - No data to display ? ? ?EKG ? ?none ? ? ?RADIOLOGY ?I, Rudene Re, attending MD, have personally viewed and interpreted the images obtained during this visit as below: ? ?CT head  negative ? ? ?___________________________________________________ ?Interpretation by Radiologist:  ?CT Head Wo Contrast ? ?Result Date: 09/28/2021 ?CLINICAL DATA:  Headache for 3 days EXAM: CT HEAD WITHOUT CONTRAST TECHNIQUE: Contiguous axial images were obtained from the base of the skull through the vertex without intravenous contrast. RADIATION DOSE REDUCTION: This exam was performed according to the departmental dose-optimization program which includes automated exposure control, adjustment of the mA and/or kV according to patient size  and/or use of iterative reconstruction technique. COMPARISON:  03/20/2018 FINDINGS: Brain: No evidence of acute infarction, hemorrhage, hydrocephalus, extra-axial collection or mass lesion/mass effect. Vascular: No hyperdense vessel or unexpected calcification. Skull: Normal. Negative for fracture or focal lesion. Sinuses/Orbits: Negative IMPRESSION: Negative head CT. Electronically Signed   By: Jorje Guild M.D.   On: 09/28/2021 04:07   ? ? ? ? ?PROCEDURES: ? ?Critical Care performed: No ? ?Procedures ? ? ? ?IMPRESSION / MDM / ASSESSMENT AND PLAN / ED COURSE  ?I reviewed the triage vital signs and the nursing notes. ? ?56 y.o. female with a history of migraine headaches who presents for evaluation of a headache.  Patient presents with 3 days of a headache that she describes as identical to her history of chronic migraines.  On exam she is well-appearing in no distress, neurologically intact, no neck stiffness, no meningeal signs, normal vital signs. ? ?Ddx: Migraine headache versus tension headache versus dehydration versus head bleed versus brain tumor ? ? ?Plan: Head CT, IV fluids, Fioricet, and IV Compazine ? ? ?MEDICATIONS GIVEN IN ED: ?Medications  ?prochlorperazine (COMPAZINE) injection 10 mg (10 mg Intravenous Given 09/28/21 0404)  ?lactated ringers bolus 1,000 mL (0 mLs Intravenous Stopped 09/28/21 0451)  ?butalbital-acetaminophen-caffeine (FIORICET) 50-325-40 MG per  tablet 1 tablet (1 tablet Oral Given 09/28/21 0404)  ? ? ? ?ED COURSE: CT with no acute pathology.  After receiving a migraine cocktail patient feels markedly improved.  Plan to discharge home with a prescription for Fioricet.  No indication for admission wit

## 2022-01-28 ENCOUNTER — Other Ambulatory Visit: Payer: Self-pay | Admitting: Obstetrics and Gynecology

## 2022-01-28 DIAGNOSIS — N952 Postmenopausal atrophic vaginitis: Secondary | ICD-10-CM

## 2022-02-01 NOTE — Telephone Encounter (Signed)
Pt called our office requesting RF on estradiol vag cream. Called pharmacy to get details on what pt is getting, spoke to [facility] and was advised 42.5 g tube with instructions being 1 applicatorful is 3 month supply. Pt states the tube is very small and that she is using it only with flare ups, she is not doing 1 applicatorful weekly. She fills it up halfway when she has a flare up and can only get 3 half way applicators from one tube.

## 2022-02-02 ENCOUNTER — Other Ambulatory Visit: Payer: Self-pay | Admitting: Obstetrics and Gynecology

## 2022-02-02 DIAGNOSIS — N952 Postmenopausal atrophic vaginitis: Secondary | ICD-10-CM

## 2022-02-02 MED ORDER — ESTRADIOL 0.1 MG/GM VA CREA: TOPICAL_CREAM | VAGINAL | 0 refills | Status: DC

## 2022-02-02 NOTE — Telephone Encounter (Signed)
Spoke with pt. She was using full applicatorful each time because that was sig on Rx. Instructed pt to use 1 g only, Rx RF with correct sig eRxd. Pt understands.

## 2022-05-19 ENCOUNTER — Emergency Department: Payer: Medicare Other

## 2022-05-19 ENCOUNTER — Emergency Department
Admission: EM | Admit: 2022-05-19 | Discharge: 2022-05-19 | Disposition: A | Discharge status: Home / Self Care | Payer: Medicare Other | Attending: Emergency Medicine | Admitting: Emergency Medicine

## 2022-05-19 DIAGNOSIS — R4781 Slurred speech: Secondary | ICD-10-CM | POA: Diagnosis not present

## 2022-05-19 DIAGNOSIS — G459 Transient cerebral ischemic attack, unspecified: Secondary | ICD-10-CM | POA: Diagnosis not present

## 2022-05-19 DIAGNOSIS — R5383 Other fatigue: Secondary | ICD-10-CM | POA: Diagnosis present

## 2022-05-19 LAB — COMPREHENSIVE METABOLIC PANEL
ALT: 14 U/L (ref 0–44)
AST: 29 U/L (ref 15–41)
Albumin: 3.9 g/dL (ref 3.5–5.0)
Alkaline Phosphatase: 96 U/L (ref 38–126)
Anion gap: 4 — ABNORMAL LOW (ref 5–15)
BUN: 18 mg/dL (ref 6–20)
CO2: 28 mmol/L (ref 22–32)
Calcium: 8.8 mg/dL — ABNORMAL LOW (ref 8.9–10.3)
Chloride: 106 mmol/L (ref 98–111)
Creatinine, Ser: 0.71 mg/dL (ref 0.44–1.00)
GFR, Estimated: >60 mL/min (ref >60)
Glucose, Bld: 96 mg/dL (ref 70–99)
Potassium: 4 mmol/L (ref 3.5–5.1)
Sodium: 138 mmol/L (ref 135–145)
Total Bilirubin: 0.7 mg/dL (ref 0.3–1.2)
Total Protein: 7.2 g/dL (ref 6.5–8.1)

## 2022-05-19 LAB — CBC
HCT: 36 % (ref 36.0–46.0)
Hemoglobin: 10.8 g/dL — ABNORMAL LOW (ref 12.0–15.0)
MCH: 24.4 pg — ABNORMAL LOW (ref 26.0–34.0)
MCHC: 30 g/dL (ref 30.0–36.0)
MCV: 81.3 fL (ref 80.0–100.0)
Platelets: 358 10*3/uL (ref 150–400)
RBC: 4.43 MIL/uL (ref 3.87–5.11)
RDW: 16.8 % — ABNORMAL HIGH (ref 11.5–15.5)
WBC: 8.1 10*3/uL (ref 4.0–10.5)
nRBC: 0 % (ref 0.0–0.2)

## 2022-05-19 LAB — DIFFERENTIAL
Abs Immature Granulocytes: 0.1 10*3/uL — ABNORMAL HIGH (ref 0.00–0.07)
Basophils Absolute: 0.2 10*3/uL — ABNORMAL HIGH (ref 0.0–0.1)
Basophils Relative: 2 %
Eosinophils Absolute: 0.4 10*3/uL (ref 0.0–0.5)
Eosinophils Relative: 5 %
Immature Granulocytes: 1 %
Lymphocytes Relative: 24 %
Lymphs Abs: 1.9 10*3/uL (ref 0.7–4.0)
Monocytes Absolute: 0.5 10*3/uL (ref 0.1–1.0)
Monocytes Relative: 6 %
Neutro Abs: 5 10*3/uL (ref 1.7–7.7)
Neutrophils Relative %: 62 %

## 2022-05-19 LAB — PROTIME-INR
INR: 1.1 (ref 0.8–1.2)
Prothrombin Time: 13.9 seconds (ref 11.4–15.2)

## 2022-05-19 LAB — ETHANOL: Alcohol, Ethyl (B): <10 mg/dL (ref <10)

## 2022-05-19 LAB — CBG MONITORING, ED: Glucose-Capillary: 93 mg/dL (ref 70–99)

## 2022-05-19 LAB — APTT: aPTT: 31 seconds (ref 24–36)

## 2022-05-19 NOTE — ED Triage Notes (Signed)
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 Pt sts that she has just felt tired all day today and that she went and ate lunch thinking that was going to help but it didn't. Pt sts that she is just tired and wants to lay down.

## 2022-05-19 NOTE — ED Provider Notes (Signed)
[facility] Provider Note    Event Date/Time   First MD Initiated Contact with Patient 05/19/22 2036     (approximate)   History   Fatigue   HPI  Mariah Reeves is a 56 y.o. 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   Denies preceding illness or trauma.   History was obtained via the patient.      Physical Exam   Triage Vital Signs: ED Triage Vitals  Enc Vitals Group     BP 05/19/22 1459 (!) 152/75     Pulse Rate 05/19/22 1459 91     Resp 05/19/22 1459 18     Temp 05/19/22 1459 98.1 F (36.7 C)     Temp Source 05/19/22 1459 Oral     SpO2 05/19/22 1459 97 %     Weight 05/19/22 1503 180 lb (81.6 kg)     Height --      Head Circumference --      Peak Flow --      Pain Score --      Pain Loc --      Pain Edu? --      Excl. in [place]? --     Most recent vital signs: Vitals:   05/19/22 1459 05/19/22 2141  BP: (!) 152/75 (!) 146/63  Pulse: 91 87  Resp: 18 18  Temp: 98.1 F (36.7 C)   SpO2: 97% 98%    General: Awake, no distress.  CV:  Good peripheral perfusion.  Resp:  Normal effort.  Abd:  No distention.  Other:  She has no focal neurologic deficits including facial asymmetry, dysarthria motor or sensory exam finger-to-nose or visual cuts.   ED Results / Procedures / Treatments   Labs (all labs ordered are listed, but only abnormal results are displayed) Labs Reviewed  CBC - Abnormal; Notable for the following components:      Result Value   Hemoglobin 10.8 (*)    MCH 24.4 (*)    RDW 16.8 (*)    All other components within normal limits  DIFFERENTIAL -  Abnormal; Notable for the following components:   Basophils Absolute 0.2 (*)    Abs Immature Granulocytes 0.10 (*)    All other components within normal limits  COMPREHENSIVE METABOLIC PANEL - Abnormal; Notable for the following components:   Calcium 8.8 (*)    Anion gap 4 (*)    All other components within normal limits  PROTIME-INR  APTT  ETHANOL  CBG MONITORING, ED  TROPONIN I (HIGH SENSITIVITY)  TROPONIN I (HIGH SENSITIVITY)     I reviewed labs and they are notable for globin is 10.8 and she has a normal glucose  EKG  ED ECG REPORT I, Pilar Jarvis, the attending physician, personally viewed and interpreted this ECG.   Date: 05/19/2022  EKG Time: 1520  Rate: 76  Rhythm: normal sinus rhythm  Axis: nl  Intervals:none  ST&T Change: No acute ischemic changes    RADIOLOGY I independently reviewed and interpreted CT of the head and see no obvious bleeding or midline shift   PROCEDURES:  Critical Care performed: No  Procedures   MEDICATIONS ORDERED IN ED: Medications - No data to display   IMPRESSION / MDM / [place] / ED COURSE  I reviewed the triage vital signs and the nursing notes.                              Differential diagnosis includes, but is not limited to, complex migraine, TIA, CVA, head bleed, electrolyte derangements    MDM: Patient with symptoms concerning for TIA.  Fortunately CT of the head was negative and she also has a full nonfocal neurologic exam and she is completely asymptomatic at this time.  Consider complex migraine as well.  Advised the patient to stay for further neurologic workup for TIAs but she declined stating that she would like to go home and follow-up as an outpatient.  I explained to the risks of undiagnosed condition and worsening or major stroke that can cause disability or death if undiagnosed and she understands and has capacity to make her decisions and would like to follow-up.  She understands to return if any  worsening or if she changes her mind.   Patient's presentation is most consistent with acute presentation with potential threat to life or bodily function.       FINAL CLINICAL IMPRESSION(S) / ED DIAGNOSES   Final diagnoses:  Other fatigue  Slurred speech     Rx / DC Orders   ED Discharge Orders     None        Note:  This document was prepared using Dragon voice recognition software and may include unintentional dictation errors.    Lucillie Garfinkel, MD 05/20/22 [phone]

## 2022-05-19 NOTE — Discharge Instructions (Signed)
Call your doctor and the neurologist at the phone number attached for follow-up appointment as soon as possible.  If you redevelop any worsening symptoms or new symptoms come back to the emergency department immediately.  Thank you for choosing [place] for your health care today!  Please see your primary doctor this week for a follow up appointment.   Sometimes, in the early stages of certain disease courses it is difficult to detect in the emergency department evaluation -- so, it is important that you continue to monitor your symptoms and call your doctor right away or return to the emergency department if you develop any new or worsening symptoms.  Please go to the following website to schedule new (and existing) patient appointments:   [email or web address]  If you do not have a primary doctor try calling the following clinics to establish care:  If you have insurance:  [facility] [phone] [address] [place] [place] [address]   [facility]  [phone] [address] [place] [place] [address]   If you do not have insurance:  [facility]  [phone] [address], [place] [address]   The following is another list of primary care offices in the area who are accepting new patients at this time.  Please reach out to one of them directly and let them know you would like to schedule an appointment to follow up on an Emergency Department visit, and/or to establish a new primary care provider (PCP).  There are likely other primary care clinics in the are who are accepting new patients, but this is an excellent place to start:  [facility] Lead physician: Dr Shirlee Latch [address] [place], [place] [address] [phone]  [facility] Lead Physician: Dr Alba Cory [address], [place], [place] [address] [phone]  [facility]  Lead  Physician: Dr Olevia Perches [address] [place], [place] [address] [phone]  [facility] Lead Physician: Dr Sofie Hartigan [address] [place], [place], [place] [address] [phone]  [facility] Lead Physician: Dr Bari Edward [address] [place], [place] [address] [phone]   It was my pleasure to care for you today.   Daneil Dan Modesto Charon, MD

## 2022-05-19 NOTE — ED Provider Triage Note (Signed)
Emergency Medicine Provider Triage Evaluation Note  PINKI ROTTMAN , a 56 y.o. female  was evaluated in triage.  Pt complains of fatigue. 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 Patient has no weakness. No headache. No blurred vision. No history of CVA   Physical Exam  LMP 12/01/2016  Gen:   Awake, no distress   Resp:  Normal effort  MSK:   Moves extremities without difficulty  Other:    Medical Decision Making  Medically screening exam initiated at 3:02 PM.  Appropriate orders placed.  Shatona A Beegle was informed that the remainder of the evaluation will be completed by another provider, this initial triage assessment does not replace that evaluation, and the importance of remaining in the ED until their evaluation is complete.   Chinita Pester, FNP 05/19/22 1722

## 2022-05-20 ENCOUNTER — Emergency Department: Payer: Medicare Other

## 2022-05-20 ENCOUNTER — Emergency Department
Admission: EM | Admit: 2022-05-20 | Discharge: 2022-05-21 | Disposition: A | Discharge status: Home / Self Care | Payer: Medicare Other | Attending: Emergency Medicine | Admitting: Emergency Medicine

## 2022-05-20 ENCOUNTER — Other Ambulatory Visit: Payer: Self-pay

## 2022-05-20 DIAGNOSIS — R519 Headache, unspecified: Secondary | ICD-10-CM | POA: Insufficient documentation

## 2022-05-20 DIAGNOSIS — D649 Anemia, unspecified: Secondary | ICD-10-CM | POA: Diagnosis not present

## 2022-05-20 LAB — CBC WITH DIFFERENTIAL/PLATELET
Abs Immature Granulocytes: 0.09 10*3/uL — ABNORMAL HIGH (ref 0.00–0.07)
Basophils Absolute: 0.1 10*3/uL (ref 0.0–0.1)
Basophils Relative: 1 %
Eosinophils Absolute: 0.5 10*3/uL (ref 0.0–0.5)
Eosinophils Relative: 5 %
HCT: 35.5 % — ABNORMAL LOW (ref 36.0–46.0)
Hemoglobin: 10.8 g/dL — ABNORMAL LOW (ref 12.0–15.0)
Immature Granulocytes: 1 %
Lymphocytes Relative: 22 %
Lymphs Abs: 2 10*3/uL (ref 0.7–4.0)
MCH: 24.6 pg — ABNORMAL LOW (ref 26.0–34.0)
MCHC: 30.4 g/dL (ref 30.0–36.0)
MCV: 80.9 fL (ref 80.0–100.0)
Monocytes Absolute: 0.6 10*3/uL (ref 0.1–1.0)
Monocytes Relative: 6 %
Neutro Abs: 6 10*3/uL (ref 1.7–7.7)
Neutrophils Relative %: 65 %
Platelets: 362 10*3/uL (ref 150–400)
RBC: 4.39 MIL/uL (ref 3.87–5.11)
RDW: 16.7 % — ABNORMAL HIGH (ref 11.5–15.5)
WBC: 9.2 10*3/uL (ref 4.0–10.5)
nRBC: 0 % (ref 0.0–0.2)

## 2022-05-20 LAB — PROTIME-INR
INR: 1 (ref 0.8–1.2)
Prothrombin Time: 12.9 seconds (ref 11.4–15.2)

## 2022-05-20 LAB — URINALYSIS, ROUTINE W REFLEX MICROSCOPIC
Bilirubin Urine: NEGATIVE
Glucose, UA: NEGATIVE mg/dL
Hgb urine dipstick: NEGATIVE
Ketones, ur: NEGATIVE mg/dL
Leukocytes,Ua: NEGATIVE
Nitrite: NEGATIVE
Protein, ur: NEGATIVE mg/dL
Specific Gravity, Urine: 1.029 (ref 1.005–1.030)
pH: 6 (ref 5.0–8.0)

## 2022-05-20 LAB — COMPREHENSIVE METABOLIC PANEL
ALT: 15 U/L (ref 0–44)
AST: 27 U/L (ref 15–41)
Albumin: 3.8 g/dL (ref 3.5–5.0)
Alkaline Phosphatase: 102 U/L (ref 38–126)
Anion gap: 6 (ref 5–15)
BUN: 18 mg/dL (ref 6–20)
CO2: 26 mmol/L (ref 22–32)
Calcium: 8.9 mg/dL (ref 8.9–10.3)
Chloride: 107 mmol/L (ref 98–111)
Creatinine, Ser: 0.67 mg/dL (ref 0.44–1.00)
GFR, Estimated: >60 mL/min (ref >60)
Glucose, Bld: 104 mg/dL — ABNORMAL HIGH (ref 70–99)
Potassium: 4.4 mmol/L (ref 3.5–5.1)
Sodium: 139 mmol/L (ref 135–145)
Total Bilirubin: 0.7 mg/dL (ref 0.3–1.2)
Total Protein: 7.1 g/dL (ref 6.5–8.1)

## 2022-05-20 LAB — AMMONIA: Ammonia: 42 umol/L — ABNORMAL HIGH (ref 9–35)

## 2022-05-20 MED ORDER — ONDANSETRON 4 MG PO TBDP
8.0000 mg | ORAL_TABLET | Freq: Once | ORAL | Status: DC
  Filled 2022-05-20: qty 2

## 2022-05-20 NOTE — ED Triage Notes (Signed)
Pt to ED via [facility] from home. Pt reports HA x2hrs. Pt was transported yesterday for stroke like symptoms and MD wanted to admit her for stroke workup but pt left. Pt denies speech changes, blurry vision or weakness.   BP 177/72 HR 92 100% RA

## 2022-05-20 NOTE — ED Notes (Signed)
Pt states her nausea has subsided and she does not want zofran at this time.

## 2022-05-20 NOTE — ED Provider Notes (Signed)
[facility] Provider Note    Event Date/Time   First MD Initiated Contact with Patient 05/20/22 1921     (approximate)   History   Headache   HPI  Mariah Reeves is a 56 y.o. female with a history of chronic back pain, bipolar disorder, depression, GERD, vertigo, and PTSD who presents with a headache for the last couple of hours.  She describes it as a "cluster" like sensation behind her left eye and nonradiating.  It is moderate in intensity.  She denies any vision changes.  She states that she has chronic migraines although has not had a headache specifically like this before yesterday.  The patient states that she had this headache yesterday.  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  She was then noted by students and staff to have difficulty speaking that lasted about 30 minutes.  She was seen in the ED and evaluated for possible TIA.  The symptoms resolved yesterday and she decided to go home.  Tonight he only had a headache but no recurrent speech difficulty, weakness or numbness, or other neurologic symptoms.    I reviewed the past medical records.  I confirmed that the patient was seen in the ED yesterday for an episode of slurred speech lasting about 30 minutes that was preceded by migraine headache.  CT was negative.  She was recommended to be admitted for stroke/TIA workup but the patient declined.  Her most recent 74 ED outpatient encounter was on 2/27 with sleep medicine for OSA.   Physical Exam   Triage Vital Signs: ED Triage Vitals  Enc Vitals Group     BP 05/20/22 1835 (!) 143/86     Pulse Rate 05/20/22 1833 90     Resp 05/20/22 1833 18     Temp 05/20/22 1833 98.1 F (36.7 C)     Temp Source 05/20/22 1833 Oral     SpO2 05/20/22 1833 98 %     Weight 05/20/22 1833 180 lb (81.6 kg)     Height --      Head Circumference --      Peak Flow --      Pain Score 05/20/22 1833 7     Pain Loc --      Pain Edu? --      Excl. in [place]? --     Most  recent vital signs: Vitals:   05/20/22 2004 05/21/22 0002  BP: 128/76 123/77  Pulse: 83 86  Resp: 16 18  Temp: 98.1 F (36.7 C) 98.3 F (36.8 C)  SpO2: 98% 98%     General: Awake, no distress.  CV:  Good peripheral perfusion.  Resp:  Normal effort.  Abd:  No distention.  Other:  EOMI.  PERRLA.  No facial droop.  Cranial nerves III through XII intact.  Motor and sensory intact in all extremities.  Normal coordination with no ataxia on finger-to-nose.  No pronator drift.   ED Results / Procedures / Treatments   Labs (all labs ordered are listed, but only abnormal results are displayed) Labs Reviewed  COMPREHENSIVE METABOLIC PANEL - Abnormal; Notable for the following components:      Result Value   Glucose, Bld 104 (*)    All other components within normal limits  CBC WITH DIFFERENTIAL/PLATELET - Abnormal; Notable for the following components:   Hemoglobin 10.8 (*)    HCT 35.5 (*)    MCH 24.6 (*)    RDW 16.7 (*)  Abs Immature Granulocytes 0.09 (*)    All other components within normal limits  URINALYSIS, ROUTINE W REFLEX MICROSCOPIC - Abnormal; Notable for the following components:   Color, Urine YELLOW (*)    APPearance HAZY (*)    All other components within normal limits  AMMONIA - Abnormal; Notable for the following components:   Ammonia 42 (*)    All other components within normal limits  PROTIME-INR     EKG     RADIOLOGY  CT head: I independently viewed and interpreted the images; there is no [facility] radiology report indicates no acute abnormality.  MR brain: No acute abnormality  PROCEDURES:  Critical Care performed: No  Procedures   MEDICATIONS ORDERED IN ED: Medications  ondansetron (ZOFRAN-ODT) disintegrating tablet 8 mg (8 mg Oral Patient Refused/Not Given 05/20/22 2248)     IMPRESSION / MDM / ASSESSMENT AND PLAN / ED COURSE  I reviewed the triage vital signs and the nursing notes.  56 year old female with PMH as noted above presents  with a headache for the last couple of hours which she also had yesterday.  She had speech disturbance yesterday and was being worked up for TIA but has not had any recurrence of neurologic symptoms.  Differential diagnosis includes, but is not limited to, migraine, cluster, tension, or other benign headache.  I have a low suspicion for intracranial hemorrhage.  CT head was obtained from triage and shows no acute findings.  This was done within a couple of hours of the onset of the headache so effectively rules out SAH.  The patient has no fever, neck pain, or meningeal signs.  She had no trauma.  Although her presentation yesterday was concerning for TIA, she has had no recurrence of neurologic symptoms or any evidence of acute CVA today.  We will obtain labs, MRI brain, and reassess.  The patient is relatively comfortable and declines any pain medication at this time.  Patient's presentation is most consistent with acute presentation with potential threat to life or bodily function.   ----------------------------------------- 12:51 AM on 05/21/2022 -----------------------------------------  Lab workup is unremarkable except for mild anemia.  Electrolytes are unremarkable.  MR brain is negative for acute findings.  The patient reports that the headache has almost completely resolved.  Although I think that admission yesterday for monitoring and stroke workup was appropriate, given that the patient has now not had any further neurologic symptoms since yesterday afternoon, has a normal neurologic exam, and negative CT and MRI, I do not feel that there remain an a strong indication for inpatient admission at this time.  The patient feels well and would like to go home.  She is appropriate for further outpatient workup.  I counseled her on the results of the workup and the likely etiologies of her symptoms.  I gave her strict return precautions and she expressed understanding.   FINAL CLINICAL  IMPRESSION(S) / ED DIAGNOSES   Final diagnoses:  Acute nonintractable headache, unspecified headache type     Rx / DC Orders   ED Discharge Orders     None        Note:  This document was prepared using Dragon voice recognition software and may include unintentional dictation errors.    Dionne Bucy, MD 05/21/22 [phone]

## 2022-05-20 NOTE — ED Provider Triage Note (Signed)
Emergency Medicine Provider Triage Evaluation Note  Mariah Reeves , a 56 y.o. Mariah  was evaluated in triage.  Pt complains of ongoing headache, slurred speech.  Patient was seen last night, diagnosed with headache and recommended admission for further assessment of possible CVA.  Patient was not able to stay last night, left.  She returns with ongoing symptoms.  Patient had sudden onset of headache and slurred speech yesterday.  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  It appears the patient still has a slight slur though I do not know patient's baseline.  Still has a headache.  No new changes.  No other complaints..  Review of Systems  Positive: Headache, slurred speech x 24 hours Negative: Chest pain, fevers or chills, URI symptoms,  Physical Exam  BP (!) 143/86   Pulse 90   Temp 98.1 F (36.7 C) (Oral)   Resp 18   Wt 81.6 kg   LMP 12/01/2016   SpO2 98%   BMI 35.15 kg/m  Gen:   Awake, no distress   Resp:  Normal effort  MSK:   Moves extremities without difficulty  Other:  Patient appears slightly drowsy, able to complete cranial nerve testing.  Does appear that patient has a slight slur to her speech  Medical Decision Making  Medically screening exam initiated at 6:41 PM.  Appropriate orders placed.  Mariah Reeves was informed that the remainder of the evaluation will be completed by another provider, this initial triage assessment does not replace that evaluation, and the importance of remaining in the ED until their evaluation is complete.  Patient presents today with headache and ongoing slurred speech.  Patient was seen yesterday, recommended admission but patient had things to take care of at home and left.  Patient returns with ongoing symptoms.  Patient will have labs, CT and MRI ordered at this time.  Symptoms have been ongoing x 24 hours and I will not activate code stroke at  this time.   Mariah Patches, PA-C 05/20/22 1841

## 2022-05-20 NOTE — Discharge Instructions (Signed)
Your CT scan and MRI today are normal. It is possible that you had a TIA yesterday but there are no signs of a stroke.  It is important that you follow up with your doctor as you may need some additional tests as an outpatient.   Return to the ER for new, worsening or persistent headache, speech changes, weakness or numbness, vision changes, difficulty with walking or coordination, or any other new or worsening symptoms that concern you.

## 2022-06-21 ENCOUNTER — Emergency Department: Payer: No Typology Code available for payment source

## 2022-06-21 ENCOUNTER — Other Ambulatory Visit: Payer: Self-pay

## 2022-06-21 ENCOUNTER — Emergency Department
Admission: EM | Admit: 2022-06-21 | Discharge: 2022-06-21 | Disposition: A | Discharge status: Home / Self Care | Payer: No Typology Code available for payment source | Attending: Emergency Medicine | Admitting: Emergency Medicine

## 2022-06-21 DIAGNOSIS — W010XXA Fall on same level from slipping, tripping and stumbling without subsequent striking against object, initial encounter: Secondary | ICD-10-CM | POA: Insufficient documentation

## 2022-06-21 DIAGNOSIS — S62102A Fracture of unspecified carpal bone, left wrist, initial encounter for closed fracture: Secondary | ICD-10-CM

## 2022-06-21 DIAGNOSIS — R0781 Pleurodynia: Secondary | ICD-10-CM | POA: Diagnosis not present

## 2022-06-21 DIAGNOSIS — Z8673 Personal history of transient ischemic attack (TIA), and cerebral infarction without residual deficits: Secondary | ICD-10-CM | POA: Insufficient documentation

## 2022-06-21 DIAGNOSIS — Y990 Civilian activity done for income or pay: Secondary | ICD-10-CM | POA: Diagnosis not present

## 2022-06-21 DIAGNOSIS — S6292XA Unspecified fracture of left wrist and hand, initial encounter for closed fracture: Secondary | ICD-10-CM | POA: Diagnosis not present

## 2022-06-21 DIAGNOSIS — W19XXXA Unspecified fall, initial encounter: Secondary | ICD-10-CM

## 2022-06-21 DIAGNOSIS — Y9301 Activity, walking, marching and hiking: Secondary | ICD-10-CM | POA: Insufficient documentation

## 2022-06-21 DIAGNOSIS — S6992XA Unspecified injury of left wrist, hand and finger(s), initial encounter: Secondary | ICD-10-CM | POA: Diagnosis present

## 2022-06-21 MED ORDER — CYCLOBENZAPRINE HCL 5 MG PO TABS: 5.0000 mg | ORAL_TABLET | Freq: Three times a day (TID) | ORAL | 0 refills | Status: DC | PRN

## 2022-06-21 MED ORDER — OXYCODONE HCL 5 MG PO TABS
5.0000 mg | ORAL_TABLET | Freq: Once | ORAL | Status: AC
  Administered 2022-06-21: 5 mg via ORAL
  Filled 2022-06-21: qty 1

## 2022-06-21 MED ORDER — OXYCODONE HCL 5 MG PO TABS: 5.0000 mg | ORAL_TABLET | Freq: Three times a day (TID) | ORAL | 0 refills | Status: AC | PRN

## 2022-06-21 MED ORDER — ONDANSETRON 4 MG PO TBDP
4.0000 mg | ORAL_TABLET | Freq: Once | ORAL | Status: AC
  Administered 2022-06-21: 4 mg via ORAL
  Filled 2022-06-21: qty 1

## 2022-06-21 NOTE — ED Notes (Signed)
First Nurse Note: Patient to ED via [facility] form [facility] for a fall. Patient was walking and tripped over a rug. Patient c/o left wrist pain and right sided abd pain. Per EMS, patient asked to be placed on 2L even though 100% on RA.  168/78 97 HR 100% 2L

## 2022-06-21 NOTE — ED Provider Triage Note (Signed)
Emergency Medicine Provider Triage Evaluation Note  Mariah Reeves , a 57 y.o. female  was evaluated in triage.  Pt complains of left wrist pain and right ribs after falling today at work. No trauma to head.  Left wrist fracture.  Review of Systems  Positive: Left wrist pain, right rib pain. Negative: No difficulty breathing.  Physical Exam  LMP 12/01/2016  Gen:   Awake, no distress, answers questions appropriately. Resp:  Normal effort, lungs are clear bilaterally.  Tender right lateral ribs and posteriorly. MSK:   Left wrist currently in a splint provided by EMS. Other:    Medical Decision Making  Medically screening exam initiated at 1:07 PM.  Appropriate orders placed.  Mardella A Califf was informed that the remainder of the evaluation will be completed by another provider, this initial triage assessment does not replace that evaluation, and the importance of remaining in the ED until their evaluation is complete.     Johnn Hai, PA-C 06/21/22 1321

## 2022-06-21 NOTE — ED Provider Notes (Signed)
[facility] Emergency Department Provider Note     Event Date/Time   First MD Initiated Contact with Patient 06/21/22 1328     (approximate)   History   Fall   HPI  Mariah Reeves is a 57 y.o. female presents to the ED for evaluation of injury sustained while at work.  Patient apparently tripped over a rug at work, landing with an outstretched left hand.  She also reports some pain to her right rib cage but she denies any head injury or LOC.  She does report history of a TIA last month.   Physical Exam   Triage Vital Signs: ED Triage Vitals  Enc Vitals Group     BP 06/21/22 1316 (!) 160/80     Pulse Rate 06/21/22 1316 77     Resp 06/21/22 1316 18     Temp 06/21/22 1316 98.5 F (36.9 C)     Temp Source 06/21/22 1316 Oral     SpO2 06/21/22 1316 100 %     Weight 06/21/22 1317 179 lb 14.3 oz (81.6 kg)     Height 06/21/22 1317 5' (1.524 m)     Head Circumference --      Peak Flow --      Pain Score 06/21/22 1317 10     Pain Loc --      Pain Edu? --      Excl. in [place]? --     Most recent vital signs: Vitals:   06/21/22 1316  BP: (!) 160/80  Pulse: 77  Resp: 18  Temp: 98.5 F (36.9 C)  SpO2: 100%    General Awake, no distress.  HEENT NCAT. PERRL. EOMI. No rhinorrhea. Mucous membranes are moist.  CV:  Good peripheral perfusion.  RESP:  Normal effort.  ABD:  No distention.  MSK:  Left wrist without obvious deformity or dislocation.  Patient with normal composite fist distally.  Mildly tender to palpation over the proximal radial wrist.   ED Results / Procedures / Treatments   Labs (all labs ordered are listed, but only abnormal results are displayed) Labs Reviewed - No data to display   EKG   RADIOLOGY  I personally viewed and evaluated these images as part of my medical decision making, as well as reviewing the written report by the radiologist.  ED Provider Interpretation: distal radial fracture noted.  Questionable remote  ulnar styloid avulsion fracture noted.  DG Wrist Complete Left  Result Date: 06/21/2022 CLINICAL DATA:  Fall.  Left wrist pain. EXAM: LEFT WRIST - COMPLETE 3+ VIEW COMPARISON:  03/12/2019. FINDINGS: Collapse of the proximal to midportion of the scaphoid. Focal area of lucency or erosion along the radial margin of the lunate. These findings are new from the prior exam. Subtle fracture of the distal radius evident on the oblique and lateral views. No fracture comminution or displacement. No other evidence of an acute fracture.  No dislocation. Skeletal structures are diffusely demineralized. There is surrounding soft tissue swelling IMPRESSION: 1. Subtle nondisplaced fracture of the distal radial metaphysis. 2. No other acute finding. 3. Collapse of the proximal to mid aspect of the scaphoid since the prior exam, which could be chronic posttraumatic change or due to avascular necrosis. Electronically Signed   By: Lajean Manes M.D.   On: 06/21/2022 14:10   DG Ribs Unilateral W/Chest Right  Result Date: 06/21/2022 CLINICAL DATA:  Fall.  Right anterior chest wall pain. EXAM: RIGHT RIBS AND CHEST - 3+ VIEW COMPARISON:  01/05/2017. FINDINGS: No rib fracture or rib lesion. Cardiac silhouette normal in size.  No mediastinal or hilar masses. Elevation/eventration of the right hemidiaphragm. Lung volumes are relatively low. Lungs are clear. No convincing pleural effusion and no pneumothorax. IMPRESSION: 1. No rib fracture or rib lesion. 2. No acute cardiopulmonary disease. Electronically Signed   By: Lajean Manes M.D.   On: 06/21/2022 14:07     PROCEDURES:  Critical Care performed: No  Procedures   MEDICATIONS ORDERED IN ED: Medications  oxyCODONE (Oxy IR/ROXICODONE) immediate release tablet 5 mg (5 mg Oral Given 06/21/22 1323)  ondansetron (ZOFRAN-ODT) disintegrating tablet 4 mg (4 mg Oral Given 06/21/22 1325)     IMPRESSION / MDM / ASSESSMENT AND PLAN / ED COURSE  I reviewed the triage vital signs  and the nursing notes.                              Differential diagnosis includes, but is not limited to, wrist fracture, wrist sprain, wrist contusion, rib fracture, rib contusion, myalgias  Patient's presentation is most consistent with acute complicated illness / injury requiring diagnostic workup.  Patient to the ED for evaluation of injury sustained following a mechanical fall today.  Patient presents for evaluation of injuries to the right rib and the left wrist.  She is evaluated for her complaints in the ED, found have reassuring exam workup overall.  X-ray images of the left wrist however, did reveal evidence of a distal nondisplaced radial fracture, based on my interpretation.  No evidence of any close rib fractures based on my interpretation of images.  Patient's diagnosis is consistent with left distal wrist fracture status post mechanical fall. Patient will be discharged home with prescriptions for #9 Roxicodone IR for acute pain. Patient is to follow up with Dr. Mack Guise for further fracture care as needed or otherwise directed. Patient is given ED precautions to return to the ED for any worsening or new symptoms.  FINAL CLINICAL IMPRESSION(S) / ED DIAGNOSES   Final diagnoses:  Fall, initial encounter  Closed fracture of left wrist, initial encounter     Rx / DC Orders   ED Discharge Orders          Ordered    cyclobenzaprine (FLEXERIL) 5 MG tablet  3 times daily PRN        06/21/22 1453    oxyCODONE (ROXICODONE) 5 MG immediate release tablet  Every 8 hours PRN        06/21/22 1503             Note:  This document was prepared using Dragon voice recognition software and may include unintentional dictation errors.    Melvenia Needles, PA-C 06/21/22 1553    Rada Hay, MD 06/28/22 [phone]

## 2022-06-21 NOTE — ED Triage Notes (Signed)
Pt here with a fall today after tripping over a rug. Pt states her left wrist is hurting as well as her right ribcage. Pt states she is also dizzy and had a TIA last month.

## 2022-06-21 NOTE — Discharge Instructions (Addendum)
Take the prescription muscle relaxant as needed. Wear the brace and follow-up with Ortho as discussed. Apply ice to reduce pain and swelling.

## 2022-07-06 DIAGNOSIS — I639 Cerebral infarction, unspecified: Secondary | ICD-10-CM | POA: Insufficient documentation

## 2022-07-06 HISTORY — DX: Cerebral infarction, unspecified: I63.9

## 2022-07-21 ENCOUNTER — Other Ambulatory Visit: Payer: Self-pay | Admitting: Family Medicine

## 2022-07-21 DIAGNOSIS — Z780 Asymptomatic menopausal state: Secondary | ICD-10-CM

## 2022-08-07 ENCOUNTER — Ambulatory Visit (INDEPENDENT_AMBULATORY_CARE_PROVIDER_SITE_OTHER): Payer: Medicare Other | Admitting: Internal Medicine

## 2022-08-07 VITALS — BP 135/85 | HR 96 | Resp 14 | Ht 60.0 in | Wt 170.0 lb

## 2022-08-07 DIAGNOSIS — G4731 Primary central sleep apnea: Secondary | ICD-10-CM

## 2022-08-07 DIAGNOSIS — Z7189 Other specified counseling: Secondary | ICD-10-CM

## 2022-08-07 NOTE — Patient Instructions (Signed)

## 2022-08-07 NOTE — Progress Notes (Signed)
[facility] [place], [place] [address]  Pulmonary Sleep Medicine   Office Visit Note  Patient Name: Mariah Reeves DOB: Nov 18, 1965 MRN [id number]    Chief Complaint: Obstructive Sleep Apnea visit  Brief History:  Mariah Reeves is seen today for an annual follow up on ASV. The patient has a 3 year history of sleep apnea. Patient is using PAP nightly.  The patient feels rested after sleeping with PAP.  The patient reports benefiting from PAP use. Reported sleepiness is improved and the Epworth Sleepiness Score is 1 out of 24. The patient does not take naps. The patient complains of the following: She has had some issues with her mask leaking, she needs new supplies her headgear may be stretched.  The compliance download shows 74% compliance with an average use time of 6 hours 10 minutes. The AHI is 6.8.  The patient does not complain of limb movements disrupting sleep.  ROS  General: (-) fever, (-) chills, (-) night sweat Nose and Sinuses: (-) nasal stuffiness or itchiness, (-) postnasal drip, (-) nosebleeds, (-) sinus trouble. Mouth and Throat: (-) sore throat, (-) hoarseness. Neck: (-) swollen glands, (-) enlarged thyroid, (-) neck pain. Respiratory: - cough, - shortness of breath, - wheezing. Neurologic: - numbness, - tingling. Psychiatric: - anxiety, - depression   Current Medication: Outpatient Encounter Medications as of 08/07/2022  Medication Sig   cyclobenzaprine (FLEXERIL) 10 MG tablet Take 10 mg by mouth at bedtime.   butalbital-acetaminophen-caffeine (FIORICET) 50-325-40 MG tablet Take 1-2 tablets by mouth every 6 (six) hours as needed for headache.   estradiol (ESTRACE) 0.1 MG/GM vaginal cream Insert 1 g vaginally weekly as maintenance   HYDROcodone-acetaminophen (NORCO) 10-325 MG tablet Take 1 tablet by mouth 4 (four) times daily as needed.   lamoTRIgine (LAMICTAL) 200 MG tablet Take 400 mg by mouth at bedtime.    ondansetron (ZOFRAN-ODT) 8 MG  disintegrating tablet Take by mouth.   pantoprazole (PROTONIX) 40 MG tablet Take 1 tablet by mouth daily.   triamcinolone cream (KENALOG) 0.5 % Apply topically 2 (two) times daily.   [DISCONTINUED] cyclobenzaprine (FLEXERIL) 5 MG tablet Take 1 tablet (5 mg total) by mouth 3 (three) times daily as needed.   [DISCONTINUED] ondansetron (ZOFRAN-ODT) 4 MG disintegrating tablet Take 1 tablet (4 mg total) by mouth every 8 (eight) hours as needed for nausea or vomiting.   [DISCONTINUED] pantoprazole (PROTONIX) 40 MG tablet pantoprazole 40 mg tablet,delayed release  TAKE 1 TABLET BY MOUTH EVERY DAY   No facility-administered encounter medications on file as of 08/07/2022.    Surgical History: Past Surgical History:  Procedure Laterality Date   ABDOMINOPLASTY  2006   CARPAL TUNNEL RELEASE Left    CHOLECYSTECTOMY     COLONOSCOPY W/ POLYPECTOMY     CYSTOSCOPY W/ URETERAL STENT PLACEMENT  01/03/2017   Procedure: CYSTOSCOPY WITH RETROGRADE PYELOGRAM/URETERAL STENT PLACEMENT;  Surgeon: Nickie Retort, MD;  Location: [facility];  Service: Urology;;   CYSTOSCOPY W/ URETERAL STENT PLACEMENT Left 01/17/2017   Procedure: CYSTOSCOPY WITH STENT REPLACEMENT;  Surgeon: Nickie Retort, MD;  Location: [facility];  Service: Urology;  Laterality: Left;   DILATATION & CURETTAGE/HYSTEROSCOPY WITH MYOSURE N/A 07/08/2015   Procedure: DILATATION & CURETTAGE/HYSTEROSCOPY WITH MYOSURE;  Surgeon: Gae Dry, MD;  Location: [facility];  Service: Gynecology;  Laterality: N/A;   DILATION AND CURETTAGE OF UTERUS     1/17   EXTRACORPOREAL SHOCK WAVE LITHOTRIPSY Left 12/07/2016   Procedure: EXTRACORPOREAL SHOCK WAVE LITHOTRIPSY (ESWL);  Surgeon: Nickie Retort, MD;  Location: [facility];  Service: Urology;  Laterality: Left;   GASTRIC BYPASS  2005   KNEE SURGERY Right    ACL- repair   LITHOTRIPSY     MIDDLE EAR SURGERY     NASAL SEPTUM SURGERY     OVARY SURGERY Right    ROTATOR CUFF REPAIR     URETEROSCOPY WITH  HOLMIUM LASER LITHOTRIPSY     perforated ureter. had nephrostomy tube for brief time   URETEROSCOPY WITH HOLMIUM LASER LITHOTRIPSY Left 01/17/2017   Procedure: URETEROSCOPY WITH HOLMIUM LASER LITHOTRIPSY;  Surgeon: Nickie Retort, MD;  Location: [facility];  Service: Urology;  Laterality: Left;    Medical History: Past Medical History:  Diagnosis Date   Allergy    Anomaly, uterus    tumor of uterus   Anxiety    Bilateral occipital neuralgia    Bipolar 1 disorder (HCC)    Chronic back pain    Complication of anesthesia    hard time waking up after gastric bypass.Pt stated that she has a small mouth and has had several surgeries since gastric bypass with no problems.   Degenerative disc disease, lumbar    Depression    Facet syndrome, lumbar    Family history of breast cancer    2/21 cancer genetic testing letter sent   GERD (gastroesophageal reflux disease)    before gastric bypass   Headache    Migraines   History of kidney stones    Left   Hx of vertigo    Patella fracture    Left   PTSD (post-traumatic stress disorder)    Sleep apnea    C-PAP   Wears glasses     Family History: Non contributory to the present illness  Social History: Social History   Socioeconomic History   Marital status: Divorced    Spouse name: Not on file   Number of children: Not on file   Years of education: Not on file   Highest education level: Not on file  Occupational History   Not on file  Tobacco Use   Smoking status: Never   Smokeless tobacco: Never  Vaping Use   Vaping Use: Never used  Substance and Sexual Activity   Alcohol use: No   Drug use: No   Sexual activity: Not Currently    Birth control/protection: None  Other Topics Concern   Not on file  Social History Narrative   Not on file   Social Determinants of Health   Financial Resource Strain: Not on file  Food Insecurity: Not on file  Transportation Needs: Not on file  Physical Activity: Not on file   Stress: Not on file  Social Connections: Not on file  Intimate Partner Violence: Not on file    Vital Signs: Blood pressure 135/85, pulse 96, resp. rate 14, height 5' (1.524 m), weight 170 lb (77.1 kg), last menstrual period 12/01/2016, SpO2 96 %. Body mass index is 33.2 kg/m.    Examination: General Appearance: The patient is well-developed, well-nourished, and in no distress. Neck Circumference: 38 cm Skin: Gross inspection of skin unremarkable. Head: normocephalic, no gross deformities. Eyes: no gross deformities noted. ENT: ears appear grossly normal Neurologic: Alert and oriented. No involuntary movements.  STOP BANG RISK ASSESSMENT S (snore) Have you been told that you snore?     NO   T (tired) Are you often tired, fatigued, or sleepy during the day?   NO  O (obstruction) Do you stop  breathing, choke, or gasp during sleep? NO   P (pressure) Do you have or are you being treated for high blood pressure? NO   B (BMI) Is your body index greater than 35 kg/m? NO   A (age) Are you 54 years old or older? YES   N (neck) Do you have a neck circumference greater than 16 inches?   NO   G (gender) Are you a female? NO   TOTAL STOP/BANG "YES" ANSWERS 1       A STOP-Bang score of 2 or less is considered low risk, and a score of 5 or more is high risk for having either moderate or severe OSA. For people who score 3 or 4, doctors may need to perform further assessment to determine how likely they are to have OSA.         EPWORTH SLEEPINESS SCALE:  Scale:  (0)= no chance of dozing; (1)= slight chance of dozing; (2)= moderate chance of dozing; (3)= high chance of dozing  Chance  Situtation    Sitting and reading: 0    Watching TV: 1    Sitting Inactive in public: 0    As a passenger in car: 0      Lying down to rest: 0    Sitting and talking: 0    Sitting quielty after lunch: 0    In a car, stopped in traffic: 0   TOTAL SCORE:   1 out of 24    SLEEP  STUDIES:  PSG (03/18/20)  AHI 37, 58% central, min SPO2 87%   CPAP COMPLIANCE DATA:  Date Range: 08/02/21 - 08/01/22  Average Daily Use: 6 hours 10 minutes  Median Use: 7 hours 36 minutes  Compliance for > 4 Hours: 271 days  AHI: 6.8 respiratory events per hour  Days Used: 346/365  Mask Leak: 43.4  95th Percentile Pressure: ASV         LABS: Recent Results (from the past 2160 hour(s))  CBG monitoring, ED     Status: None   Collection Time: 05/19/22  3:03 PM  Result Value Ref Range   Glucose-Capillary 93 70 - 99 mg/dL    Comment: Glucose reference range applies only to samples taken after fasting for at least 8 hours.  Protime-INR     Status: None   Collection Time: 05/19/22  3:07 PM  Result Value Ref Range   Prothrombin Time 13.9 11.4 - 15.2 seconds   INR 1.1 0.8 - 1.2    Comment: (NOTE) INR goal varies based on device and disease states. Performed at [facility], [address] [place], [place] [address]   APTT     Status: None   Collection Time: 05/19/22  3:07 PM  Result Value Ref Range   aPTT 31 24 - 36 seconds    Comment: Performed at [facility], [address] [place], [place] [address]  CBC     Status: Abnormal   Collection Time: 05/19/22  3:07 PM  Result Value Ref Range   WBC 8.1 4.0 - 10.5 K/uL   RBC 4.43 3.87 - 5.11 MIL/uL   Hemoglobin 10.8 (L) 12.0 - 15.0 g/dL   HCT 36.0 36.0 - 46.0 %   MCV 81.3 80.0 - 100.0 fL   MCH 24.4 (L) 26.0 - 34.0 pg   MCHC 30.0 30.0 - 36.0 g/dL   RDW 16.8 (H) 11.5 - 15.5 %   Platelets 358 150 - 400 K/uL   nRBC 0.0 0.0 -  0.2 %    Comment: Performed at [facility], [address] [place], [place] [address]  Differential     Status: Abnormal   Collection Time: 05/19/22  3:07 PM  Result Value Ref Range   Neutrophils Relative % 62 %   Neutro Abs 5.0 1.7 - 7.7 K/uL   Lymphocytes Relative 24 %   Lymphs Abs 1.9 0.7 - 4.0 K/uL   Monocytes Relative 6 %   Monocytes Absolute 0.5 0.1 -  1.0 K/uL   Eosinophils Relative 5 %   Eosinophils Absolute 0.4 0.0 - 0.5 K/uL   Basophils Relative 2 %   Basophils Absolute 0.2 (H) 0.0 - 0.1 K/uL   Immature Granulocytes 1 %   Abs Immature Granulocytes 0.10 (H) 0.00 - 0.07 K/uL    Comment: Performed at [facility], [address] [place], [place] [address]  Comprehensive metabolic panel     Status: Abnormal   Collection Time: 05/19/22  3:07 PM  Result Value Ref Range   Sodium 138 135 - 145 mmol/L   Potassium 4.0 3.5 - 5.1 mmol/L   Chloride 106 98 - 111 mmol/L   CO2 28 22 - 32 mmol/L   Glucose, Bld 96 70 - 99 mg/dL    Comment: Glucose reference range applies only to samples taken after fasting for at least 8 hours.   BUN 18 6 - 20 mg/dL   Creatinine, Ser 0.71 0.44 - 1.00 mg/dL   Calcium 8.8 (L) 8.9 - 10.3 mg/dL   Total Protein 7.2 6.5 - 8.1 g/dL   Albumin 3.9 3.5 - 5.0 g/dL   AST 29 15 - 41 U/L   ALT 14 0 - 44 U/L   Alkaline Phosphatase 96 38 - 126 U/L   Total Bilirubin 0.7 0.3 - 1.2 mg/dL   GFR, Estimated >60 >60 mL/min    Comment: (NOTE) Calculated using the CKD-EPI Creatinine Equation (2021)    Anion gap 4 (L) 5 - 15    Comment: Performed at [facility], [address], [place], [place] [address]  Ethanol     Status: None   Collection Time: 05/19/22  3:07 PM  Result Value Ref Range   Alcohol, Ethyl (B) <10 <10 mg/dL    Comment: (NOTE) Lowest detectable limit for serum alcohol is 10 mg/dL.  For medical purposes only. Performed at [facility], [address] [place],  [address]   Comprehensive metabolic panel     Status: Abnormal   Collection Time: 05/20/22  6:37 PM  Result Value Ref Range   Sodium 139 135 - 145 mmol/L   Potassium 4.4 3.5 - 5.1 mmol/L   Chloride 107 98 - 111 mmol/L   CO2 26 22 - 32 mmol/L   Glucose, Bld 104 (H) 70 - 99 mg/dL    Comment: Glucose reference range applies only to samples taken after fasting for at least 8 hours.   BUN 18 6 - 20 mg/dL    Creatinine, Ser 0.67 0.44 - 1.00 mg/dL   Calcium 8.9 8.9 - 10.3 mg/dL   Total Protein 7.1 6.5 - 8.1 g/dL   Albumin 3.8 3.5 - 5.0 g/dL   AST 27 15 - 41 U/L   ALT 15 0 - 44 U/L   Alkaline Phosphatase 102 38 - 126 U/L   Total Bilirubin 0.7 0.3 - 1.2 mg/dL   GFR, Estimated >60 >60 mL/min    Comment: (NOTE) Calculated using the CKD-EPI Creatinine Equation (2021)    Anion  gap 6 5 - 15    Comment: Performed at [facility], [address] [place], [place] [address]  CBC with Differential     Status: Abnormal   Collection Time: 05/20/22  6:37 PM  Result Value Ref Range   WBC 9.2 4.0 - 10.5 K/uL   RBC 4.39 3.87 - 5.11 MIL/uL   Hemoglobin 10.8 (L) 12.0 - 15.0 g/dL   HCT 35.5 (L) 36.0 - 46.0 %   MCV 80.9 80.0 - 100.0 fL   MCH 24.6 (L) 26.0 - 34.0 pg   MCHC 30.4 30.0 - 36.0 g/dL   RDW 16.7 (H) 11.5 - 15.5 %   Platelets 362 150 - 400 K/uL   nRBC 0.0 0.0 - 0.2 %   Neutrophils Relative % 65 %   Neutro Abs 6.0 1.7 - 7.7 K/uL   Lymphocytes Relative 22 %   Lymphs Abs 2.0 0.7 - 4.0 K/uL   Monocytes Relative 6 %   Monocytes Absolute 0.6 0.1 - 1.0 K/uL   Eosinophils Relative 5 %   Eosinophils Absolute 0.5 0.0 - 0.5 K/uL   Basophils Relative 1 %   Basophils Absolute 0.1 0.0 - 0.1 K/uL   Immature Granulocytes 1 %   Abs Immature Granulocytes 0.09 (H) 0.00 - 0.07 K/uL    Comment: Performed at [facility], [address] [place], [place] [address]  Protime-INR     Status: None   Collection Time: 05/20/22  6:37 PM  Result Value Ref Range   Prothrombin Time 12.9 11.4 - 15.2 seconds   INR 1.0 0.8 - 1.2    Comment: (NOTE) INR goal varies based on device and disease states. Performed at [facility], [address] [place], [place] [address]   Urinalysis, Routine w reflex microscopic Urine, Clean Catch     Status: Abnormal   Collection Time: 05/20/22  6:37 PM  Result Value Ref Range   Color, Urine YELLOW (A) YELLOW   APPearance HAZY (A) CLEAR   Specific  Gravity, Urine 1.029 1.005 - 1.030   pH 6.0 5.0 - 8.0   Glucose, UA NEGATIVE NEGATIVE mg/dL   Hgb urine dipstick NEGATIVE NEGATIVE   Bilirubin Urine NEGATIVE NEGATIVE   Ketones, ur NEGATIVE NEGATIVE mg/dL   Protein, ur NEGATIVE NEGATIVE mg/dL   Nitrite NEGATIVE NEGATIVE   Leukocytes,Ua NEGATIVE NEGATIVE    Comment: Performed at [facility], [address] [place], [place] [address]  Ammonia     Status: Abnormal   Collection Time: 05/20/22  6:37 PM  Result Value Ref Range   Ammonia 42 (H) 9 - 35 umol/L    Comment: Performed at [facility], [address] [place],  [address]    Radiology: DG Wrist Complete Left  Result Date: 06/21/2022 CLINICAL DATA:  Fall.  Left wrist pain. EXAM: LEFT WRIST - COMPLETE 3+ VIEW COMPARISON:  03/12/2019. FINDINGS: Collapse of the proximal to midportion of the scaphoid. Focal area of lucency or erosion along the radial margin of the lunate. These findings are new from the prior exam. Subtle fracture of the distal radius evident on the oblique and lateral views. No fracture comminution or displacement. No other evidence of an acute fracture.  No dislocation. Skeletal structures are diffusely demineralized. There is surrounding soft tissue swelling IMPRESSION: 1. Subtle nondisplaced fracture of the distal radial metaphysis. 2. No other acute finding. 3. Collapse of the proximal to mid aspect of the scaphoid since the prior exam, which could be chronic posttraumatic  change or due to avascular necrosis. Electronically Signed   By: Lajean Manes M.D.   On: 06/21/2022 14:10   DG Ribs Unilateral W/Chest Right  Result Date: 06/21/2022 CLINICAL DATA:  Fall.  Right anterior chest wall pain. EXAM: RIGHT RIBS AND CHEST - 3+ VIEW COMPARISON:  01/05/2017. FINDINGS: No rib fracture or rib lesion. Cardiac silhouette normal in size.  No mediastinal or hilar masses. Elevation/eventration of the right hemidiaphragm. Lung volumes are relatively low.  Lungs are clear. No convincing pleural effusion and no pneumothorax. IMPRESSION: 1. No rib fracture or rib lesion. 2. No acute cardiopulmonary disease. Electronically Signed   By: Lajean Manes M.D.   On: 06/21/2022 14:07    No results found.  No results found.    Assessment and Plan: Patient Active Problem List   Diagnosis Date Noted   Complex sleep apnea syndrome 08/08/2021   Encounter for BiPAP use counseling 08/08/2021   Mass of upper inner quadrant of left breast 07/19/2021   Vaginal atrophy 07/19/2021   Family history of breast cancer 07/19/2021   OSA on CPAP 04/19/2020   CPAP use counseling 04/19/2020   Nephrolithiasis 01/26/2017   Left ureteral stone 01/03/2017   Endometrial disorder 07/08/2015   Bilateral occipital neuralgia 03/02/2015   DDD (degenerative disc disease), lumbar 12/24/2014   Facet syndrome, lumbar 12/24/2014   Sacroiliac joint dysfunction 12/24/2014    1. Complex sleep apnea syndrome The patient does  tolerate PAP and reports  benefit from PAP use. Her apnea is not optimally controlled and we will try an EPAP min of 8. We will do a 2 week download.  The patient was reminded how to clean equipment and advised to replace supplies more frequently due to leak. The patient was also counselled on weight loss. The compliance is good. The AHI is 6.8.   Complex sleep apnea syndrome- suboptimal control, will increase min EPAP to 8, 2 week download.   2. Encounter for BiPAP use counseling Counseling: had a lengthy discussion with the patient regarding the importance of PAP therapy in management of the sleep apnea. Patient appears to understand the risk factor reduction and also understands the risks associated with untreated sleep apnea. Patient will try to make a good faith effort to remain compliant with therapy. Also instructed the patient on proper cleaning of the device including the water must be changed daily if possible and use of distilled water is preferred.  Patient understands that the machine should be regularly cleaned with appropriate recommended cleaning solutions that do not damage the PAP machine for example given white vinegar and water rinses. Other methods such as ozone treatment may not be as good as these simple methods to achieve cleaning.     General Counseling: I have discussed the findings of the evaluation and examination with Ivannia.  I have also discussed any further diagnostic evaluation thatmay be needed or ordered today. Honi verbalizes understanding of the findings of todays visit. We also reviewed her medications today and discussed drug interactions and side effects including but not limited excessive drowsiness and altered mental states. We also discussed that there is always a risk not just to her but also people around her. she has been encouraged to call the office with any questions or concerns that should arise related to todays visit.  No orders of the defined types were placed in this encounter.       I have personally obtained a history, examined the patient, evaluated laboratory and imaging results, formulated  the assessment and plan and placed orders. This patient was seen today by Tressie Ellis, PA-C in collaboration with Dr. Devona Konig.   Allyne Gee, MD [facility] Diplomate ABMS Pulmonary Critical Care Medicine and Sleep Medicine

## 2022-08-30 DIAGNOSIS — G459 Transient cerebral ischemic attack, unspecified: Secondary | ICD-10-CM | POA: Insufficient documentation

## 2022-09-06 ENCOUNTER — Other Ambulatory Visit: Payer: Self-pay | Admitting: Family Medicine

## 2022-09-06 DIAGNOSIS — Z1231 Encounter for screening mammogram for malignant neoplasm of breast: Secondary | ICD-10-CM

## 2022-09-14 ENCOUNTER — Telehealth: Payer: Self-pay

## 2022-09-14 DIAGNOSIS — N952 Postmenopausal atrophic vaginitis: Secondary | ICD-10-CM

## 2022-09-14 MED ORDER — ESTRADIOL 0.1 MG/GM VA CREA: TOPICAL_CREAM | VAGINAL | 1 refills | Status: AC

## 2022-09-14 NOTE — Telephone Encounter (Signed)
Pt called triage for refill of estrace, refill sent to [facility] per pt request.

## 2022-10-17 ENCOUNTER — Ambulatory Visit: Payer: Medicare Other | Admitting: Obstetrics and Gynecology

## 2022-11-28 ENCOUNTER — Ambulatory Visit
Admission: RE | Admit: 2022-11-28 | Discharge: 2022-11-28 | Disposition: A | Discharge status: Home / Self Care | Payer: Medicare Other | Source: Ambulatory Visit | Attending: Family Medicine | Admitting: Family Medicine

## 2022-11-28 DIAGNOSIS — Z1231 Encounter for screening mammogram for malignant neoplasm of breast: Secondary | ICD-10-CM | POA: Insufficient documentation

## 2023-01-17 ENCOUNTER — Ambulatory Visit
Admission: RE | Admit: 2023-01-17 | Discharge: 2023-01-17 | Disposition: A | Discharge status: Home / Self Care | Payer: Medicare Other | Source: Ambulatory Visit | Attending: Family Medicine | Admitting: Family Medicine

## 2023-01-17 DIAGNOSIS — Z780 Asymptomatic menopausal state: Secondary | ICD-10-CM | POA: Diagnosis present

## 2023-04-04 DIAGNOSIS — I10 Essential (primary) hypertension: Secondary | ICD-10-CM | POA: Insufficient documentation

## 2023-07-30 ENCOUNTER — Other Ambulatory Visit: Payer: Self-pay

## 2023-07-30 ENCOUNTER — Emergency Department: Payer: Medicare Other

## 2023-07-30 ENCOUNTER — Emergency Department
Admission: EM | Admit: 2023-07-30 | Discharge: 2023-07-30 | Disposition: A | Discharge status: Home / Self Care | Payer: Medicare Other | Attending: Student in an Organized Health Care Education/Training Program | Admitting: Student in an Organized Health Care Education/Training Program

## 2023-07-30 DIAGNOSIS — S42294A Other nondisplaced fracture of upper end of right humerus, initial encounter for closed fracture: Secondary | ICD-10-CM | POA: Diagnosis not present

## 2023-07-30 DIAGNOSIS — W010XXA Fall on same level from slipping, tripping and stumbling without subsequent striking against object, initial encounter: Secondary | ICD-10-CM | POA: Diagnosis not present

## 2023-07-30 DIAGNOSIS — S4991XA Unspecified injury of right shoulder and upper arm, initial encounter: Secondary | ICD-10-CM | POA: Diagnosis present

## 2023-07-30 MED ORDER — OXYCODONE-ACETAMINOPHEN 10-325 MG PO TABS: 1.0000 | ORAL_TABLET | Freq: Four times a day (QID) | ORAL | 0 refills | Status: DC | PRN

## 2023-07-30 MED ORDER — OXYCODONE-ACETAMINOPHEN 5-325 MG PO TABS
1.0000 | ORAL_TABLET | Freq: Once | ORAL | Status: AC
  Administered 2023-07-30: 1 via ORAL
  Filled 2023-07-30: qty 1

## 2023-07-30 NOTE — ED Provider Triage Note (Signed)
Emergency Medicine Provider Triage Evaluation Note  BRELEIGH CARPINO , a 58 y.o. female  was evaluated in triage.  Pt complains of right shoulder pain since fall this am, was going to get a bone marrow biopsy and fell.  Review of Systems  Positive:  Negative:   Physical Exam  LMP 12/01/2016  Gen:   Awake, no distress   Resp:  Normal effort  MSK:   Right shoulder and humerus tender Other:    Medical Decision Making  Medically screening exam initiated at 8:49 AM.  Appropriate orders placed.  Adriel A Jepsen was informed that the remainder of the evaluation will be completed by another provider, this initial triage assessment does not replace that evaluation, and the importance of remaining in the ED until their evaluation is complete.     Faythe Ghee, PA-C 07/30/23 [phone]

## 2023-07-30 NOTE — ED Provider Notes (Signed)
[facility] Provider Note    Event Date/Time   First MD Initiated Contact with Patient 07/30/23 1115     (approximate)   History   Fall   HPI  Mariah Reeves is a 58 y.o. female presents to the ER for evaluation of acute right shoulder pain that occurred after mechanical fall between trash cans this morning.  Landed on her right shoulder.  Did not hit her head.  Denies any abdominal pain no chest pain.  No other associated injury.     Physical Exam   Triage Vital Signs: ED Triage Vitals  Encounter Vitals Group     BP 07/30/23 0849 (!) 157/110     Systolic BP Percentile --      Diastolic BP Percentile --      Pulse Rate 07/30/23 0849 90     Resp 07/30/23 0849 18     Temp 07/30/23 0849 98 F (36.7 C)     Temp src --      SpO2 07/30/23 0849 96 %     Weight --      Height --      Head Circumference --      Peak Flow --      Pain Score 07/30/23 0847 10     Pain Loc --      Pain Education --      Exclude from Growth Chart --     Most recent vital signs: Vitals:   07/30/23 0849  BP: (!) 157/110  Pulse: 90  Resp: 18  Temp: 98 F (36.7 C)  SpO2: 96%     Constitutional: Alert  Eyes: Conjunctivae are normal.  Head: Atraumatic. Nose: No congestion/rhinnorhea. Mouth/Throat: Mucous membranes are moist.   Neck: Painless ROM.  Cardiovascular:   Good peripheral circulation. Respiratory: Normal respiratory effort.  No retractions.  Gastrointestinal: Soft and nontender.  Musculoskeletal: Pain of the right shoulder.  No laceration.  Compartments are soft.  Neurovascular intact distally. Neurologic:  MAE spontaneously. No gross focal neurologic deficits are appreciated.  Skin:  Skin is warm, dry and intact. No rash noted. Psychiatric: Mood and affect are normal. Speech and behavior are normal.    ED Results / Procedures / Treatments   Labs (all labs ordered are listed, but only abnormal results are displayed) Labs Reviewed - No data to  display   EKG     RADIOLOGY Please see ED Course for my review and interpretation.  I personally reviewed all radiographic images ordered to evaluate for the above acute complaints and reviewed radiology reports and findings.  These findings were personally discussed with the patient.  Please see medical record for radiology report.    PROCEDURES:  Critical Care performed:   Procedures   MEDICATIONS ORDERED IN ED: Medications  oxyCODONE-acetaminophen (PERCOCET/ROXICET) 5-325 MG per tablet 1 tablet (has no administration in time range)     IMPRESSION / MDM / ASSESSMENT AND PLAN / ED COURSE  I reviewed the triage vital signs and the nursing notes.                              Differential diagnosis includes, but is not limited to, fracture, contusion, dislocation.  Patient presented to ER for evaluation of right shoulder pain after mechanical fall as described above.  X-ray on my review and interpretation shows evidence of proximal right humerus fracture.  Compartments are soft neurovascularly intact.  No evidence of  other associated injury.  Will be given pain medication.  She is already established with orthopedic surgeon who she will follow-up with.  Patient does appear stable and appropriate for discharge.     FINAL CLINICAL IMPRESSION(S) / ED DIAGNOSES   Final diagnoses:  Other closed nondisplaced fracture of proximal end of right humerus, initial encounter     Rx / DC Orders   ED Discharge Orders          Ordered    oxyCODONE-acetaminophen (PERCOCET) 10-325 MG tablet  Every 6 hours PRN        07/30/23 1134             Note:  This document was prepared using Dragon voice recognition software and may include unintentional dictation errors.    Willy Eddy, MD 07/30/23 1135

## 2023-07-30 NOTE — ED Triage Notes (Signed)
Pt comes with c/o trip and fall today. Pt states she just tripped. Pt states she thinks she might have dislocated her right shoulder. Pt states no thinners, loc or hitting head.

## 2023-08-01 ENCOUNTER — Emergency Department
Admission: EM | Admit: 2023-08-01 | Discharge: 2023-08-01 | Disposition: A | Discharge status: Home / Self Care | Payer: Medicare Other | Attending: Student | Admitting: Student

## 2023-08-01 ENCOUNTER — Other Ambulatory Visit: Payer: Self-pay

## 2023-08-01 DIAGNOSIS — X58XXXD Exposure to other specified factors, subsequent encounter: Secondary | ICD-10-CM | POA: Diagnosis not present

## 2023-08-01 DIAGNOSIS — S42354D Nondisplaced comminuted fracture of shaft of humerus, right arm, subsequent encounter for fracture with routine healing: Secondary | ICD-10-CM | POA: Insufficient documentation

## 2023-08-01 DIAGNOSIS — Z760 Encounter for issue of repeat prescription: Secondary | ICD-10-CM | POA: Diagnosis not present

## 2023-08-01 DIAGNOSIS — S42355D Nondisplaced comminuted fracture of shaft of humerus, left arm, subsequent encounter for fracture with routine healing: Secondary | ICD-10-CM

## 2023-08-01 MED ORDER — ONDANSETRON 8 MG PO TBDP
8.0000 mg | ORAL_TABLET | Freq: Once | ORAL | Status: AC
  Administered 2023-08-01: 8 mg via ORAL
  Filled 2023-08-01: qty 1

## 2023-08-01 MED ORDER — HYDROCODONE-ACETAMINOPHEN 7.5-325 MG/15ML PO SOLN
10.0000 mL | Freq: Once | ORAL | Status: AC
  Administered 2023-08-01: 10 mL via ORAL
  Filled 2023-08-01: qty 15

## 2023-08-01 MED ORDER — HYDROCODONE-ACETAMINOPHEN 7.5-325 MG/15ML PO SOLN: 10.0000 mL | Freq: Four times a day (QID) | ORAL | 0 refills | Status: AC | PRN

## 2023-08-01 NOTE — Discharge Instructions (Addendum)
You have right humerus fracture please take Hycet 1 tablet every 6 hours for the next 2 days until you see your orthopedic doctor for reevaluation.  Please be sure you have the sling in the wrong position.  Please come back to ED or go to your PCP if you have new symptoms or symptoms worsen

## 2023-08-01 NOTE — ED Provider Notes (Signed)
[facility] Provider Note    None    (approximate)   History   Arm Injury   HPI  Mariah Reeves is a 58 y.o. female who presents today with history of right humeral fracture on Monday, patient was discharged with oxycodone and follow-up with orthopedics.  States she does not have any more pain medicine, she has a pain contract with her physician who referred her to ED for reeval.  Patient will see orthopedic doctor in 2 days.      Physical Exam   Triage Vital Signs: ED Triage Vitals  Encounter Vitals Group     BP 08/01/23 1658 136/83     Systolic BP Percentile --      Diastolic BP Percentile --      Pulse Rate 08/01/23 1658 (!) 118     Resp 08/01/23 1658 18     Temp 08/01/23 1658 97.9 F (36.6 C)     Temp Source 08/01/23 1658 Oral     SpO2 08/01/23 1658 100 %     Weight 08/01/23 1657 176 lb (79.8 kg)     Height 08/01/23 1657 4\' 11"  (1.499 m)     Head Circumference --      Peak Flow --      Pain Score 08/01/23 1656 10     Pain Loc --      Pain Education --      Exclude from Growth Chart --     Most recent vital signs: Vitals:   08/01/23 1658  BP: 136/83  Pulse: (!) 118  Resp: 18  Temp: 97.9 F (36.6 C)  SpO2: 100%     Constitutional: Alert, crying.  Eyes: Conjunctivae are normal.  Head: Atraumatic. Nose: No congestion/rhinnorhea. Mouth/Throat: Mucous membranes are moist.   Neck: Painless ROM.  Cardiovascular:   Good peripheral circulation. Respiratory: Normal respiratory effort.  No retractions.  Gastrointestinal: Soft and nontender.  Musculoskeletal:  no deformity.  Right shoulder: Sling in a wrong position.  The accommodated sling improving the pain and numbness.  Neurovascular intact.  Strength decreased 4/5.  Sensation 5/5.  Neurologic:  MAE spontaneously. No gross focal neurologic deficits are appreciated.  Skin:  Skin is warm, dry and intact. No rash noted. Psychiatric: Mood and affect are normal. Speech and behavior are  normal.    ED Results / Procedures / Treatments   Labs (all labs ordered are listed, but only abnormal results are displayed) Labs Reviewed - No data to display   EKG     RADIOLOGY   PROCEDURES:  Critical Care performed:   Procedures   MEDICATIONS ORDERED IN ED: Medications  HYDROcodone-acetaminophen (HYCET) 7.5-325 mg/15 ml solution 10 mL (has no administration in time range)     IMPRESSION / MDM / ASSESSMENT AND PLAN / ED COURSE  I reviewed the triage vital signs and the nursing notes.  Differential diagnosis includes, but is not limited to, humeral fracture, compartmental syndrome, muscle strain  Patient's presentation is most consistent with acute, uncomplicated illness.   Patient's diagnosis is consistent with right humeral fracture. I independently rechecked imaging and agree with radiologists findings.  I showed  the images to the patient. I did review the patient's allergies and medications.  Patient have 1 doses of Norco during admission due to the weather. Patient will be discharged home with prescriptions for Norco for 2 days. Patient is to follow up with orthopedics in 2 days  as directed. Patient is given ED precautions to return  to the ED for any worsening or new symptoms. Discussed plan of care with patient, answered all of patient's questions, Patient agreeable to plan of care. Advised patient to take medications according to the instructions on the label. Discussed possible side effects of new medications. Patient verbalized understanding.     FINAL CLINICAL IMPRESSION(S) / ED DIAGNOSES   Final diagnoses:  Closed nondisplaced comminuted fracture of shaft of left humerus with routine healing, subsequent encounter     Rx / DC Orders   ED Discharge Orders          Ordered    HYDROcodone-acetaminophen (HYCET) 7.5-325 mg/15 ml solution  Every 6 hours PRN        08/01/23 1857             Note:  This document was prepared using Dragon voice  recognition software and may include unintentional dictation errors.   Gladys Damme, PA-C 08/01/23 1859    Merwyn Katos, MD 08/01/23 [phone]

## 2023-08-01 NOTE — ED Triage Notes (Signed)
Patient states she fell on Monday and had a right humeral fracture; states she was given Oxycodone but only has 2 left. Patient called pain management MD but was told to come to ED for re-evaluation.

## 2023-08-06 DIAGNOSIS — C9210 Chronic myeloid leukemia, BCR/ABL-positive, not having achieved remission: Secondary | ICD-10-CM | POA: Insufficient documentation

## 2023-08-06 HISTORY — DX: Chronic myeloid leukemia, BCR/ABL-positive, not having achieved remission: C92.10

## 2023-08-11 DIAGNOSIS — S42201A Unspecified fracture of upper end of right humerus, initial encounter for closed fracture: Secondary | ICD-10-CM | POA: Insufficient documentation

## 2023-09-20 ENCOUNTER — Ambulatory Visit: Attending: Family Medicine | Admitting: Physical Therapy

## 2023-09-20 DIAGNOSIS — M6281 Muscle weakness (generalized): Secondary | ICD-10-CM | POA: Diagnosis present

## 2023-09-20 DIAGNOSIS — M25511 Pain in right shoulder: Secondary | ICD-10-CM | POA: Diagnosis present

## 2023-09-20 DIAGNOSIS — M25611 Stiffness of right shoulder, not elsewhere classified: Secondary | ICD-10-CM | POA: Diagnosis present

## 2023-09-20 DIAGNOSIS — R293 Abnormal posture: Secondary | ICD-10-CM | POA: Insufficient documentation

## 2023-09-20 DIAGNOSIS — G8929 Other chronic pain: Secondary | ICD-10-CM | POA: Insufficient documentation

## 2023-09-26 ENCOUNTER — Ambulatory Visit: Admitting: Physical Therapy

## 2023-09-27 ENCOUNTER — Ambulatory Visit: Admitting: Physical Therapy

## 2023-09-29 ENCOUNTER — Encounter: Payer: Self-pay | Admitting: Physical Therapy

## 2023-09-29 NOTE — Therapy (Signed)
 OUTPATIENT PHYSICAL THERAPY SHOULDER EVALUATION   Patient Name: Mariah Reeves MRN: [id number] DOB:19-Jun-1965, 58 y.o., female Today's Date: 09/20/2023  END OF SESSION:  PT End of Session - 09/29/23 1041     Visit Number 1    Number of Visits 24    Date for PT Re-Evaluation 12/13/23    PT Start Time 1530    PT Stop Time 1611    PT Time Calculation (min) 41 min    Activity Tolerance Patient tolerated treatment well    Behavior During Therapy WFL for tasks assessed/performed             Past Medical History:  Diagnosis Date   Allergy    Anomaly, uterus    tumor of uterus   Anxiety    Bilateral occipital neuralgia    Bipolar 1 disorder (HCC)    Chronic back pain    Complication of anesthesia    hard time waking up after gastric bypass.Pt stated that she has a small mouth and has had several surgeries since gastric bypass with no problems.   Degenerative disc disease, lumbar    Depression    Facet syndrome, lumbar    Family history of breast cancer    2/21 cancer genetic testing letter sent   GERD (gastroesophageal reflux disease)    before gastric bypass   Headache    Migraines   History of kidney stones    Left   Hx of vertigo    Patella fracture    Left   PTSD (post-traumatic stress disorder)    Sleep apnea    C-PAP   Wears glasses    Past Surgical History:  Procedure Laterality Date   ABDOMINOPLASTY  2006   CARPAL TUNNEL RELEASE Left    CHOLECYSTECTOMY     COLONOSCOPY W/ POLYPECTOMY     CYSTOSCOPY W/ URETERAL STENT PLACEMENT  01/03/2017   Procedure: CYSTOSCOPY WITH RETROGRADE PYELOGRAM/URETERAL STENT PLACEMENT;  Surgeon: Bart Born, MD;  Location: [facility];  Service: Urology;;   CYSTOSCOPY W/ URETERAL STENT PLACEMENT Left 01/17/2017   Procedure: CYSTOSCOPY WITH STENT REPLACEMENT;  Surgeon: Bart Born, MD;  Location: [facility];  Service: Urology;  Laterality: Left;   DILATATION & CURETTAGE/HYSTEROSCOPY WITH MYOSURE N/A 07/08/2015    Procedure: DILATATION & CURETTAGE/HYSTEROSCOPY WITH MYOSURE;  Surgeon: Alben Alma, MD;  Location: [facility];  Service: Gynecology;  Laterality: N/A;   DILATION AND CURETTAGE OF UTERUS     1/17   EXTRACORPOREAL SHOCK WAVE LITHOTRIPSY Left 12/07/2016   Procedure: EXTRACORPOREAL SHOCK WAVE LITHOTRIPSY (ESWL);  Surgeon: Bart Born, MD;  Location: [facility];  Service: Urology;  Laterality: Left;   GASTRIC BYPASS  2005   KNEE SURGERY Right    ACL- repair   LITHOTRIPSY     MIDDLE EAR SURGERY     NASAL SEPTUM SURGERY     OVARY SURGERY Right    ROTATOR CUFF REPAIR     URETEROSCOPY WITH HOLMIUM LASER LITHOTRIPSY     perforated ureter. 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   Encounter for BiPAP use counseling 08/08/2021   Mass of upper inner quadrant of left breast 07/19/2021   Vaginal atrophy 07/19/2021   Family  history of breast cancer 07/19/2021   OSA on CPAP 04/19/2020   CPAP use counseling 04/19/2020   Nephrolithiasis 01/26/2017   Left ureteral stone 01/03/2017   Endometrial disorder 07/08/2015   Bilateral occipital neuralgia 03/02/2015   DDD (degenerative disc disease), lumbar 12/24/2014   Facet syndrome, lumbar 12/24/2014   Sacroiliac joint dysfunction 12/24/2014    PCP: Claudine Cullens, MD  REFERRING PROVIDER: Sheilah Denver   REFERRING DIAG: Z61.096E (ICD-10-CM) - Unspecified fracture of upper end of right humerus, subsequent encounter for fracture with routine healing  THERAPY DIAG:  Shoulder joint stiffness, right  Chronic right shoulder pain  Muscle weakness (generalized)  Abnormal posture  Rationale for Evaluation and Treatment:  Rehabilitation  ONSET DATE: 07/30/23  Fall resulting in R proximal humerus fx  SUBJECTIVE:                                                                                                                                                                                      SUBJECTIVE STATEMENT: Pt. Fell on 2/17 while bring the trash bin in resulting in R proximal humerus fracture.  Pt. States the last x-ray revealed fracture is healed.  Pt. Reports 8/10 R shoulder pain at worst and demonstrates minimal movement.  Pt. Sleeping on back with R shoulder off the pillow.  Pt. 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  Pt. Is taking Oxy for pain and remains guarded with R shoulder movement.  Pt. States she has been doing pendulum ex.   Hand dominance: Right  PERTINENT HISTORY: Pt. Well known to PT clinic.  See MD notes.   PAIN:  Are you having pain? Yes: NPRS scale: 8/10 Pain location: R proximal shoulder Pain description: aching/ sharp Aggravating factors: movement Relieving factors: meds/ rest  PRECAUTIONS: Shoulder  RED FLAGS: None   WEIGHT BEARING RESTRICTIONS: No  FALLS:  Has patient fallen in last 6 months? Yes. Number of falls 1 with injury  LIVING ENVIRONMENT: Lives with: lives with their family and lives alone Has following equipment at home: None  OCCUPATION: Editor, commissioning at [employer]  PLOF: Independent  PATIENT GOALS: Increase R shoulder ROM/ strength/ return to [employer]  NEXT MD VISIT:   OBJECTIVE:  Note: Objective measures were completed at Evaluation unless otherwise noted.  DIAGNOSTIC FINDINGS:  See imaging  PATIENT SURVEYS:  Quick Dash 90.9%  significant limitations  COGNITION: Overall cognitive status: Within functional limits for tasks assessed     SENSATION: WFL  POSTURE: Forward head/ rounded shoulder posture  UPPER EXTREMITY ROM:   Active ROM Right PROM eval Left AROM eval  Shoulder flexion 122 deg. 150 deg.  Shoulder extension    Shoulder abduction 81 deg. 150 deg.  Shoulder adduction    Shoulder internal rotation 74 deg. 85 deg.  Shoulder external rotation 18 deg. 85 deg.  Elbow flexion [facility] WFL  Elbow extension    Wrist flexion    Wrist extension    Wrist ulnar deviation    Wrist radial deviation    Wrist pronation    Wrist supination limited   (Blank rows = not tested)  No AROM in R shoulder flexion/ abduction.  R forearm limited supination as compared to L.  UPPER EXTREMITY MMT:  MMT Right eval Left eval  Shoulder flexion Unable to test R UE   Shoulder extension    Shoulder abduction    Shoulder adduction    Shoulder internal rotation    Shoulder external rotation    Middle trapezius    Lower trapezius    Elbow flexion    Elbow extension    Wrist flexion    Wrist extension    Wrist ulnar deviation    Wrist radial deviation    Wrist pronation    Wrist supination    Grip strength (lbs) 10.6# 19.8%  (Blank rows = not tested)  SHOULDER SPECIAL TESTS: Not able to test at this time  JOINT MOBILITY TESTING:  Guarded movement.  Significant crepitus  PALPATION:  R/L mid-bicep circumference: 39 cm/ 28.5 cm.   10.5 cm difference.  Tenderness                                                                                                                             TREATMENT DATE: 09/20/23  See evaluation/ HEP  (issued shoulder pulleys)  PATIENT EDUCATION: Education details: See HEP Person educated: Patient Education method: Explanation, Demonstration, and Handouts Education comprehension: verbalized understanding and returned demonstration  HOME EXERCISE PROGRAM: Access Code: [email or web address] URL: [email or web address] Date: 09/20/2023 Prepared by: Hazeline Lister  Exercises - Seated Bicep Curls with Bar  - 2 x daily - 7 x weekly - 2 sets - 10 reps - Seated Forearm Pronation and Supination AROM  - 2 x daily - 7 x weekly - 2 sets - 10 reps - Seated  Shoulder Flexion AAROM with Pulley Behind  - 2 x daily - 7 x weekly - 2 sets - 10 reps - Seated Shoulder Scaption AAROM with Pulley at Side  - 2 x daily - 7 x weekly - 2 sets - 10 reps - Seated Shoulder Abduction AAROM with Pulley Behind  - 2 x daily - 7 x weekly - 2 sets - 10 reps - Circular Shoulder Pendulum with Table Support  - 2 x daily - 7 x weekly - 2 sets - 10 reps  ASSESSMENT:  CLINICAL IMPRESSION: Patient is a pleasant 58 y.o. female who was seen today for physical therapy evaluation and treatment for R proximal humerus fracture.  Pt. Presents with no R shoulder flexion/ abduction AROM.  Pt. Has limited R shoulder PROM  and grip strength.  Pt. Has significant swelling R upper arm as compared to L UE.  Pt. Will benefit from skilled PT services to increase R shoulder ROM/ strength to improve overhead reaching/ pain-free mobility.    OBJECTIVE IMPAIRMENTS: decreased activity tolerance, decreased mobility, decreased ROM, decreased strength, hypomobility, impaired flexibility, impaired UE functional use, improper body mechanics, postural dysfunction, and pain.   ACTIVITY LIMITATIONS: carrying, lifting, bathing, toileting, dressing, self feeding, reach over head, and hygiene/grooming  PARTICIPATION LIMITATIONS: cleaning, laundry, driving, shopping, community activity, and occupation  PERSONAL FACTORS: Fitness and Past/current experiences are also affecting patient's functional outcome.   REHAB POTENTIAL: Good  CLINICAL DECISION MAKING: Evolving/moderate complexity  EVALUATION COMPLEXITY: Moderate   GOALS: Goals reviewed with patient? Yes  SHORT TERM GOALS: Target date: 10/11/23  Pt. Independent with HEP to increase R shoulder PROM to [facility] as compared to L shoulder to progress to AROM/ ADL.   Baseline: see above Goal status: INITIAL  LONG TERM GOALS: Target date: 12/13/23  Pt. Will decrease QuickDASH to <50% to improve UE functional mobility.   Baseline:  90/9% Goal status:  INITIAL  2.  Pt. Will increase R shoulder flexion AROM to >120 deg. To reaching overhead/ manage hair.   Baseline:  no AROM at time of evaluation Goal status: INITIAL  3.  Pt. Will present with 4/5 MMT in R UE to improve daily household/ work-related tasks.   Baseline:  No MMT at time of evaluation Goal status: INITIAL  4.  Pt. Will report no R shoulder pain/limitations with daily tasks to improve return to PLOF.   Baseline:  R shoulder pain/ limited.  Goal status: INITIAL   PLAN:  PT FREQUENCY: 2x/week  PT DURATION: 12 weeks  PLANNED INTERVENTIONS: 97110-Therapeutic exercises, 97530- Therapeutic activity, [id number]- Neuromuscular re-education, 97535- Self Care, [id number]- Manual therapy, [id number]- Electrical stimulation (unattended), Patient/Family education, Joint mobilization, Cryotherapy, and Moist heat  PLAN FOR NEXT SESSION: Reassess R shoulder swelling/ ROM   Lendell Quarry, PT, DPT # [phone] 09/29/2023, 10:45 AM

## 2023-10-03 ENCOUNTER — Ambulatory Visit: Admitting: Physical Therapy

## 2023-10-03 ENCOUNTER — Encounter: Payer: Self-pay | Admitting: Physical Therapy

## 2023-10-03 DIAGNOSIS — M25611 Stiffness of right shoulder, not elsewhere classified: Secondary | ICD-10-CM | POA: Diagnosis not present

## 2023-10-03 DIAGNOSIS — R293 Abnormal posture: Secondary | ICD-10-CM

## 2023-10-03 DIAGNOSIS — G8929 Other chronic pain: Secondary | ICD-10-CM

## 2023-10-03 DIAGNOSIS — M6281 Muscle weakness (generalized): Secondary | ICD-10-CM

## 2023-10-03 NOTE — Therapy (Signed)
 OUTPATIENT PHYSICAL THERAPY SHOULDER TREATMENT  Patient Name: Mariah Reeves MRN: [id number] DOB:1965/07/28, 58 y.o., female Today's Date: 10/03/2023  END OF SESSION:  PT End of Session - 10/03/23 1606     Visit Number 2    Number of Visits 24    Date for PT Re-Evaluation 12/13/23    PT Start Time 1542    PT Stop Time 1633    PT Time Calculation (min) 51 min             Past Medical History:  Diagnosis Date   Allergy    Anomaly, uterus    tumor of uterus   Anxiety    Bilateral occipital neuralgia    Bipolar 1 disorder (HCC)    Chronic back pain    Complication of anesthesia    hard time waking up after gastric bypass.Pt stated that she has a small mouth and has had several surgeries since gastric bypass with no problems.   Degenerative disc disease, lumbar    Depression    Facet syndrome, lumbar    Family history of breast cancer    2/21 cancer genetic testing letter sent   GERD (gastroesophageal reflux disease)    before gastric bypass   Headache    Migraines   History of kidney stones    Left   Hx of vertigo    Patella fracture    Left   PTSD (post-traumatic stress disorder)    Sleep apnea    C-PAP   Wears glasses    Past Surgical History:  Procedure Laterality Date   ABDOMINOPLASTY  2006   CARPAL TUNNEL RELEASE Left    CHOLECYSTECTOMY     COLONOSCOPY W/ POLYPECTOMY     CYSTOSCOPY W/ URETERAL STENT PLACEMENT  01/03/2017   Procedure: CYSTOSCOPY WITH RETROGRADE PYELOGRAM/URETERAL STENT PLACEMENT;  Surgeon: Bart Born, MD;  Location: [facility];  Service: Urology;;   CYSTOSCOPY W/ URETERAL STENT PLACEMENT Left 01/17/2017   Procedure: CYSTOSCOPY WITH STENT REPLACEMENT;  Surgeon: Bart Born, MD;  Location: [facility];  Service: Urology;  Laterality: Left;   DILATATION & CURETTAGE/HYSTEROSCOPY WITH MYOSURE N/A 07/08/2015   Procedure: DILATATION & CURETTAGE/HYSTEROSCOPY WITH MYOSURE;  Surgeon: Alben Alma, MD;  Location: [facility];  Service:  Gynecology;  Laterality: N/A;   DILATION AND CURETTAGE OF UTERUS     1/17   EXTRACORPOREAL SHOCK WAVE LITHOTRIPSY Left 12/07/2016   Procedure: EXTRACORPOREAL SHOCK WAVE LITHOTRIPSY (ESWL);  Surgeon: Bart Born, MD;  Location: [facility];  Service: Urology;  Laterality: Left;   GASTRIC BYPASS  2005   KNEE SURGERY Right    ACL- repair   LITHOTRIPSY     MIDDLE EAR SURGERY     NASAL SEPTUM SURGERY     OVARY SURGERY Right    ROTATOR CUFF REPAIR     URETEROSCOPY WITH HOLMIUM LASER LITHOTRIPSY     perforated ureter. 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   Encounter for BiPAP use counseling 08/08/2021   Mass of upper inner quadrant of left breast 07/19/2021   Vaginal atrophy 07/19/2021   Family history of breast cancer 07/19/2021   OSA on CPAP 04/19/2020   CPAP use counseling 04/19/2020   Nephrolithiasis  01/26/2017   Left ureteral stone 01/03/2017   Endometrial disorder 07/08/2015   Bilateral occipital neuralgia 03/02/2015   DDD (degenerative disc disease), lumbar 12/24/2014   Facet syndrome, lumbar 12/24/2014   Sacroiliac joint dysfunction 12/24/2014    PCP: Claudine Cullens, MD  REFERRING PROVIDER: Sheilah Denver   REFERRING DIAG: Z61.096E (ICD-10-CM) - Unspecified fracture of upper end of right humerus, subsequent encounter for fracture with routine healing  THERAPY DIAG:  Shoulder joint stiffness, right  Chronic right shoulder pain  Muscle weakness (generalized)  Abnormal posture  Rationale for Evaluation and Treatment: Rehabilitation  ONSET DATE: 07/30/23  Fall resulting in R proximal humerus fx  SUBJECTIVE:                                                                                                                                                                                       SUBJECTIVE STATEMENT: Pt. Fell on 2/17 while bring the trash bin in resulting in R proximal humerus fracture.  Pt. States the last x-ray revealed fracture is healed.  Pt. Reports 8/10 R shoulder pain at worst and demonstrates minimal movement.  Pt. Sleeping on back with R shoulder off the pillow.  Pt. 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  Pt. Is taking Oxy for pain and remains guarded with R shoulder movement.  Pt. States she has been doing pendulum ex.   Hand dominance: Right  PERTINENT HISTORY: Pt. Well known to PT clinic.  See MD notes.   PAIN:  Are you having pain? Yes: NPRS scale: 8/10 Pain location: R proximal shoulder Pain description: aching/ sharp Aggravating factors: movement Relieving factors: meds/ rest  PRECAUTIONS: Shoulder  RED FLAGS: None   WEIGHT BEARING RESTRICTIONS: No  FALLS:  Has patient fallen in last 6 months? Yes. Number of falls 1 with injury  LIVING ENVIRONMENT: Lives with: lives with their family and lives alone Has following equipment at home: None  OCCUPATION: Editor, commissioning at [employer]  PLOF: Independent  PATIENT GOALS: Increase R shoulder ROM/ strength/ return to [employer]  NEXT MD VISIT:   OBJECTIVE:  Note: Objective measures were completed at Evaluation unless otherwise noted.  DIAGNOSTIC FINDINGS:  See imaging  PATIENT SURVEYS:  Quick Dash 90.9%  significant limitations  COGNITION: Overall cognitive status: Within functional limits for tasks assessed     SENSATION: WFL  POSTURE: Forward head/ rounded shoulder posture  UPPER EXTREMITY ROM:   Active ROM Right PROM eval Left AROM eval  Shoulder flexion 122 deg. 150 deg.  Shoulder extension    Shoulder abduction 81 deg. 150 deg.  Shoulder adduction    Shoulder internal rotation 74  deg. 85 deg.  Shoulder external  rotation 18 deg. 85 deg.  Elbow flexion [facility] WFL  Elbow extension    Wrist flexion    Wrist extension    Wrist ulnar deviation    Wrist radial deviation    Wrist pronation    Wrist supination limited   (Blank rows = not tested)  No AROM in R shoulder flexion/ abduction.  R forearm limited supination as compared to L.  UPPER EXTREMITY MMT:  MMT Right eval Left eval  Shoulder flexion Unable to test R UE   Shoulder extension    Shoulder abduction    Shoulder adduction    Shoulder internal rotation    Shoulder external rotation    Middle trapezius    Lower trapezius    Elbow flexion    Elbow extension    Wrist flexion    Wrist extension    Wrist ulnar deviation    Wrist radial deviation    Wrist pronation    Wrist supination    Grip strength (lbs) 10.6# 19.8%  (Blank rows = not tested)  SHOULDER SPECIAL TESTS: Not able to test at this time  JOINT MOBILITY TESTING:  Guarded movement.  Significant crepitus  PALPATION:  R/L mid-bicep circumference: 39 cm/ 28.5 cm.   10.5 cm difference.  Tenderness                                                                                                                             TREATMENT DATE: 10/03/23  Subjective:  Pt. Reports 3/10 R shoulder pain prior to tx. Session.  Pt. Has been compliant with HEP/ use of pulley.  Pt. Trying to use R UE more with household/ work tasks.  Pt. Still unable to use R UE to pull lever on recliner.    There.ex.:  Pulley ex.: flexion/ scaption/ abduction 15x each.  Pain tolerable range with cuing to try an actively hold R UE overhead.    Supine R shoulder AROM (all planes)- limited/ serratus punches 20x (challenged with maintaining R sh. At 90 deg. Flexion). Moderate R shoulder/elbow crepitus noted.    Supine wand ex.: chest press/ shoulder flexion 15x each.    Manual tx.:  Supine R shoulder AAROM with wand:  flexion (126 deg.).     Supine R shoulder LAD/ joint capsule reassessment  (AP/PA/inf.).    PATIENT EDUCATION: Education details: See HEP Person educated: Patient Education method: Explanation, Demonstration, and Handouts Education comprehension: verbalized understanding and returned demonstration  HOME EXERCISE PROGRAM: Access Code: [email or web address] URL: [email or web address] Date: 09/20/2023 Prepared by: Hazeline Lister  Exercises - Seated Bicep Curls with Bar  - 2 x daily - 7 x weekly - 2 sets - 10 reps - Seated Forearm Pronation and Supination AROM  - 2 x daily - 7 x weekly - 2 sets - 10 reps - Seated Shoulder Flexion AAROM with Pulley Behind  - 2 x daily - 7 x weekly - 2 sets -  10 reps - Seated Shoulder Scaption AAROM with Pulley at Side  - 2 x daily - 7 x weekly - 2 sets - 10 reps - Seated Shoulder Abduction AAROM with Pulley Behind  - 2 x daily - 7 x weekly - 2 sets - 10 reps - Circular Shoulder Pendulum with Table Support  - 2 x daily - 7 x weekly - 2 sets - 10 reps  ASSESSMENT:  CLINICAL IMPRESSION: Pt. Presents with limited R shoulder flexion/ abduction AROM.  Pt. Has limited R shoulder PROM and grip strength.  Pt. Has significant swelling R upper arm as compared to L UE with slight improvement since initial evaluation.  Pt. Encouraged to complete ex. Program on a daily/ consistent basis in a pain tolerable range.  Pt. Will benefit from skilled PT services to increase R shoulder ROM/ strength to improve overhead reaching/ pain-free mobility.    OBJECTIVE IMPAIRMENTS: decreased activity tolerance, decreased mobility, decreased ROM, decreased strength, hypomobility, impaired flexibility, impaired UE functional use, improper body mechanics, postural dysfunction, and pain.   ACTIVITY LIMITATIONS: carrying, lifting, bathing, toileting, dressing, self feeding, reach over head, and hygiene/grooming  PARTICIPATION LIMITATIONS: cleaning, laundry, driving, shopping, community activity, and occupation  PERSONAL FACTORS: Fitness and Past/current  experiences are also affecting patient's functional outcome.   REHAB POTENTIAL: Good  CLINICAL DECISION MAKING: Evolving/moderate complexity  EVALUATION COMPLEXITY: Moderate   GOALS: Goals reviewed with patient? Yes  SHORT TERM GOALS: Target date: 10/11/23  Pt. Independent with HEP to increase R shoulder PROM to [facility] as compared to L shoulder to progress to AROM/ ADL.   Baseline: see above Goal status: INITIAL  LONG TERM GOALS: Target date: 12/13/23  Pt. Will decrease QuickDASH to <50% to improve UE functional mobility.   Baseline:  90/9% Goal status: INITIAL  2.  Pt. Will increase R shoulder flexion AROM to >120 deg. To reaching overhead/ manage hair.   Baseline:  no AROM at time of evaluation Goal status: INITIAL  3.  Pt. Will present with 4/5 MMT in R UE to improve daily household/ work-related tasks.   Baseline:  No MMT at time of evaluation Goal status: INITIAL  4.  Pt. Will report no R shoulder pain/limitations with daily tasks to improve return to PLOF.   Baseline:  R shoulder pain/ limited.  Goal status: INITIAL   PLAN:  PT FREQUENCY: 2x/week  PT DURATION: 12 weeks  PLANNED INTERVENTIONS: 97110-Therapeutic exercises, 97530- Therapeutic activity, [id number]- Neuromuscular re-education, 97535- Self Care, 16109- Manual therapy, G0283- Electrical stimulation (unattended), Patient/Family education, Joint mobilization, Cryotherapy, and Moist heat  PLAN FOR NEXT SESSION: Reassess R shoulder swelling/ ROM   Lendell Quarry, PT, DPT # [phone] 10/03/2023, 5:52 PM

## 2023-10-11 DIAGNOSIS — D649 Anemia, unspecified: Secondary | ICD-10-CM

## 2023-10-11 HISTORY — DX: Anemia, unspecified: D64.9

## 2023-10-16 ENCOUNTER — Ambulatory Visit: Attending: Orthopedic Surgery | Admitting: Physical Therapy

## 2023-10-16 DIAGNOSIS — M6281 Muscle weakness (generalized): Secondary | ICD-10-CM | POA: Diagnosis present

## 2023-10-16 DIAGNOSIS — M25511 Pain in right shoulder: Secondary | ICD-10-CM | POA: Insufficient documentation

## 2023-10-16 DIAGNOSIS — M25611 Stiffness of right shoulder, not elsewhere classified: Secondary | ICD-10-CM

## 2023-10-16 DIAGNOSIS — R293 Abnormal posture: Secondary | ICD-10-CM | POA: Diagnosis present

## 2023-10-16 DIAGNOSIS — G8929 Other chronic pain: Secondary | ICD-10-CM | POA: Diagnosis present
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
## 2023-10-18 ENCOUNTER — Ambulatory Visit: Admitting: Physical Therapy

## 2023-10-18 ENCOUNTER — Encounter: Payer: Self-pay | Admitting: Physical Therapy

## 2023-10-18 DIAGNOSIS — M25611 Stiffness of right shoulder, not elsewhere classified: Secondary | ICD-10-CM

## 2023-10-18 DIAGNOSIS — M6281 Muscle weakness (generalized): Secondary | ICD-10-CM

## 2023-10-18 DIAGNOSIS — R293 Abnormal posture: Secondary | ICD-10-CM

## 2023-10-18 DIAGNOSIS — G8929 Other chronic pain: Secondary | ICD-10-CM

## 2023-10-18 NOTE — Therapy (Signed)
 OUTPATIENT PHYSICAL THERAPY SHOULDER TREATMENT  Patient Name: Mariah Reeves MRN: [id number] DOB:1965/08/22, 58 y.o., female Today's Date: 10/18/2023  END OF SESSION:  PT End of Session - 10/18/23 1459     Visit Number 4    Number of Visits 24    Date for PT Re-Evaluation 12/13/23    PT Start Time 1517    PT Stop Time 1607    PT Time Calculation (min) 50 min             Past Medical History:  Diagnosis Date   Allergy    Anomaly, uterus    tumor of uterus   Anxiety    Bilateral occipital neuralgia    Bipolar 1 disorder (HCC)    Chronic back pain    Complication of anesthesia    hard time waking up after gastric bypass.Pt stated that she has a small mouth and has had several surgeries since gastric bypass with no problems.   Degenerative disc disease, lumbar    Depression    Facet syndrome, lumbar    Family history of breast cancer    2/21 cancer genetic testing letter sent   GERD (gastroesophageal reflux disease)    before gastric bypass   Headache    Migraines   History of kidney stones    Left   Hx of vertigo    Patella fracture    Left   PTSD (post-traumatic stress disorder)    Sleep apnea    C-PAP   Wears glasses    Past Surgical History:  Procedure Laterality Date   ABDOMINOPLASTY  2006   CARPAL TUNNEL RELEASE Left    CHOLECYSTECTOMY     COLONOSCOPY W/ POLYPECTOMY     CYSTOSCOPY W/ URETERAL STENT PLACEMENT  01/03/2017   Procedure: CYSTOSCOPY WITH RETROGRADE PYELOGRAM/URETERAL STENT PLACEMENT;  Surgeon: Bart Born, MD;  Location: [facility];  Service: Urology;;   CYSTOSCOPY W/ URETERAL STENT PLACEMENT Left 01/17/2017   Procedure: CYSTOSCOPY WITH STENT REPLACEMENT;  Surgeon: Bart Born, MD;  Location: [facility];  Service: Urology;  Laterality: Left;   DILATATION & CURETTAGE/HYSTEROSCOPY WITH MYOSURE N/A 07/08/2015   Procedure: DILATATION & CURETTAGE/HYSTEROSCOPY WITH MYOSURE;  Surgeon: Alben Alma, MD;  Location: [facility];  Service:  Gynecology;  Laterality: N/A;   DILATION AND CURETTAGE OF UTERUS     1/17   EXTRACORPOREAL SHOCK WAVE LITHOTRIPSY Left 12/07/2016   Procedure: EXTRACORPOREAL SHOCK WAVE LITHOTRIPSY (ESWL);  Surgeon: Bart Born, MD;  Location: [facility];  Service: Urology;  Laterality: Left;   GASTRIC BYPASS  2005   KNEE SURGERY Right    ACL- repair   LITHOTRIPSY     MIDDLE EAR SURGERY     NASAL SEPTUM SURGERY     OVARY SURGERY Right    ROTATOR CUFF REPAIR     URETEROSCOPY WITH HOLMIUM LASER LITHOTRIPSY     perforated ureter. 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   Encounter for BiPAP use counseling 08/08/2021   Mass of upper inner quadrant of left breast 07/19/2021   Vaginal atrophy 07/19/2021   Family history of breast cancer 07/19/2021   OSA on CPAP 04/19/2020   CPAP use counseling 04/19/2020   Nephrolithiasis  01/26/2017   Left ureteral stone 01/03/2017   Endometrial disorder 07/08/2015   Bilateral occipital neuralgia 03/02/2015   DDD (degenerative disc disease), lumbar 12/24/2014   Facet syndrome, lumbar 12/24/2014   Sacroiliac joint dysfunction 12/24/2014    PCP: Claudine Cullens, MD  REFERRING PROVIDER: Sheilah Denver   REFERRING DIAG: H84.696E (ICD-10-CM) - Unspecified fracture of upper end of right humerus, subsequent encounter for fracture with routine healing  THERAPY DIAG:  Shoulder joint stiffness, right  Chronic right shoulder pain  Muscle weakness (generalized)  Abnormal posture  Rationale for Evaluation and Treatment: Rehabilitation  ONSET DATE: 07/30/23  Fall resulting in R proximal humerus fx  SUBJECTIVE:                                                                                                                                                                                       SUBJECTIVE STATEMENT: Pt. Fell on 2/17 while bring the trash bin in resulting in R proximal humerus fracture.  Pt. States the last x-ray revealed fracture is healed.  Pt. Reports 8/10 R shoulder pain at worst and demonstrates minimal movement.  Pt. Sleeping on back with R shoulder off the pillow.  Pt. 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  Pt. Is taking Oxy for pain and remains guarded with R shoulder movement.  Pt. States she has been doing pendulum ex.   Hand dominance: Right  PERTINENT HISTORY: Pt. Well known to PT clinic.  See MD notes.   PAIN:  Are you having pain? Yes: NPRS scale: 8/10 Pain location: R proximal shoulder Pain description: aching/ sharp Aggravating factors: movement Relieving factors: meds/ rest  PRECAUTIONS: Shoulder  RED FLAGS: None   WEIGHT BEARING RESTRICTIONS: No  FALLS:  Has patient fallen in last 6 months? Yes. Number of falls 1 with injury  LIVING ENVIRONMENT: Lives with: lives with their family and lives alone Has following equipment at home: None  OCCUPATION: Editor, commissioning at [employer]  PLOF: Independent  PATIENT GOALS: Increase R shoulder ROM/ strength/ return to [employer]  NEXT MD VISIT:   OBJECTIVE:  Note: Objective measures were completed at Evaluation unless otherwise noted.  DIAGNOSTIC FINDINGS:  See imaging  PATIENT SURVEYS:  Quick Dash 90.9% significant limitations  COGNITION: Overall cognitive status: Within functional limits for tasks assessed     SENSATION: WFL  POSTURE: Forward head/ rounded shoulder posture  UPPER EXTREMITY ROM:   Active ROM Right PROM eval Left AROM eval  Shoulder flexion 122 deg. 150 deg.  Shoulder extension    Shoulder abduction 81 deg. 150 deg.  Shoulder adduction    Shoulder internal rotation 74 deg.  85 deg.  Shoulder external  rotation 18 deg. 85 deg.  Elbow flexion [facility] WFL  Elbow extension    Wrist flexion    Wrist extension    Wrist ulnar deviation    Wrist radial deviation    Wrist pronation    Wrist supination limited   (Blank rows = not tested)  No AROM in R shoulder flexion/ abduction.  R forearm limited supination as compared to L.  UPPER EXTREMITY MMT:  MMT Right eval Left eval  Shoulder flexion Unable to test R UE   Shoulder extension    Shoulder abduction    Shoulder adduction    Shoulder internal rotation    Shoulder external rotation    Middle trapezius    Lower trapezius    Elbow flexion    Elbow extension    Wrist flexion    Wrist extension    Wrist ulnar deviation    Wrist radial deviation    Wrist pronation    Wrist supination    Grip strength (lbs) 10.6# 19.8%  (Blank rows = not tested)  SHOULDER SPECIAL TESTS: Not able to test at this time  JOINT MOBILITY TESTING:  Guarded movement.  Significant crepitus  PALPATION:  R/L mid-bicep circumference: 39 cm/ 28.5 cm.   10.5 cm difference.  Tenderness                                                                                                                             TREATMENT DATE: 10/18/2023  Subjective:  Pt. Reports 7/10 R shoulder pain prior to tx. Session.  Pt. Reports a 10/10 R shoulder pain after last PT tx. Session and it took a couple days to calm down.    R mid-bicep circumference: 37.5 cm.  Slight improvement but swelling remains.   There.ex.:  Pulley ex.: flexion/ scaption/ abduction 10x each.  Pain tolerable range with cuing to try an actively hold R UE overhead.  Discussed R sh. Pain after last PT tx.    Standing wall ladder: flexion 10x (marked with blue sticker).    B UBE: 2 min. F/b.    Supine R shoulder A/AROM (all planes)- serratus punches 20x (challenged with maintaining R sh. At 90 deg. Flexion).  R shoulder flexion/ bicep curls 20x.  Moderate R shoulder/elbow crepitus noted. Cuing to maintain  R forearm supination t/o movement.    Standing RTB scap. Retraction 20x/ B ER 20x.    Discussed HEP  Manual tx.:  Supine R shoulder AAROM (all planes).  Supine R shoulder AROM: 100 deg. Flexion.    Seated STM to R UT region/ no Hypervolt today.  Not today: L sidelying R sh. Abduction/ horizontal adduction/ abduction A/AROM 20x each.  Pain tolerable range.      PATIENT EDUCATION: Education details: See HEP Person educated: Patient Education method: Explanation, Demonstration, and Handouts Education comprehension: verbalized understanding and returned demonstration  HOME EXERCISE PROGRAM: Access Code: [email or web address] URL: [email or web address] Date: 09/20/2023 Prepared  by: Hazeline Lister  Exercises - Seated Bicep Curls with Bar  - 2 x daily - 7 x weekly - 2 sets - 10 reps - Seated Forearm Pronation and Supination AROM  - 2 x daily - 7 x weekly - 2 sets - 10 reps - Seated Shoulder Flexion AAROM with Pulley Behind  - 2 x daily - 7 x weekly - 2 sets - 10 reps - Seated Shoulder Scaption AAROM with Pulley at Side  - 2 x daily - 7 x weekly - 2 sets - 10 reps - Seated Shoulder Abduction AAROM with Pulley Behind  - 2 x daily - 7 x weekly - 2 sets - 10 reps - Circular Shoulder Pendulum with Table Support  - 2 x daily - 7 x weekly - 2 sets - 10 reps  ASSESSMENT:  CLINICAL IMPRESSION: Pt. Presents with limited R shoulder flexion/ abduction AROM.  Pt. Has limited R shoulder strength and grip strength.  Pt. Has slight improvement in R upper arm swelling as compared to L UE. Pt. Encouraged to complete ex. Program on a daily/ consistent basis in a pain tolerable range.  Pt. Will benefit from skilled PT services to increase R shoulder ROM/ strength to improve overhead reaching/ pain-free mobility.    OBJECTIVE IMPAIRMENTS: decreased activity tolerance, decreased mobility, decreased ROM, decreased strength, hypomobility, impaired flexibility, impaired UE functional use, improper body  mechanics, postural dysfunction, and pain.   ACTIVITY LIMITATIONS: carrying, lifting, bathing, toileting, dressing, self feeding, reach over head, and hygiene/grooming  PARTICIPATION LIMITATIONS: cleaning, laundry, driving, shopping, community activity, and occupation  PERSONAL FACTORS: Fitness and Past/current experiences are also affecting patient's functional outcome.   REHAB POTENTIAL: Good  CLINICAL DECISION MAKING: Evolving/moderate complexity  EVALUATION COMPLEXITY: Moderate   GOALS: Goals reviewed with patient? Yes  SHORT TERM GOALS: Target date: 10/11/23  Pt. Independent with HEP to increase R shoulder PROM to [facility] as compared to L shoulder to progress to AROM/ ADL.   Baseline: see above Goal status: Not met  LONG TERM GOALS: Target date: 12/13/23  Pt. Will decrease QuickDASH to <50% to improve UE functional mobility.   Baseline:  90/9% Goal status: INITIAL  2.  Pt. Will increase R shoulder flexion AROM to >120 deg. To reaching overhead/ manage hair.   Baseline:  no AROM at time of evaluation Goal status: INITIAL  3.  Pt. Will present with 4/5 MMT in R UE to improve daily household/ work-related tasks.   Baseline:  No MMT at time of evaluation Goal status: INITIAL  4.  Pt. Will report no R shoulder pain/limitations with daily tasks to improve return to PLOF.   Baseline:  R shoulder pain/ limited.  Goal status: INITIAL   PLAN:  PT FREQUENCY: 2x/week  PT DURATION: 12 weeks  PLANNED INTERVENTIONS: 97110-Therapeutic exercises, 97530- Therapeutic activity, [id number]- Neuromuscular re-education, 97535- Self Care, 29528- Manual therapy, [id number]- Electrical stimulation (unattended), Patient/Family education, Joint mobilization, Cryotherapy, and Moist heat  PLAN FOR NEXT SESSION: Reassess R shoulder swelling/ ROM.  ISSUE resisted ex. (RTB next tx.).     Lendell Quarry, PT, DPT # [phone] 10/18/2023, 8:54 PM

## 2023-10-23 ENCOUNTER — Encounter: Payer: Self-pay | Admitting: Physical Therapy

## 2023-10-23 ENCOUNTER — Ambulatory Visit: Admitting: Physical Therapy

## 2023-10-23 DIAGNOSIS — G8929 Other chronic pain: Secondary | ICD-10-CM

## 2023-10-23 DIAGNOSIS — M25611 Stiffness of right shoulder, not elsewhere classified: Secondary | ICD-10-CM

## 2023-10-23 DIAGNOSIS — M6281 Muscle weakness (generalized): Secondary | ICD-10-CM

## 2023-10-23 DIAGNOSIS — R293 Abnormal posture: Secondary | ICD-10-CM

## 2023-10-23 NOTE — Therapy (Addendum)
 OUTPATIENT PHYSICAL THERAPY SHOULDER TREATMENT  Patient Name: Mariah Reeves MRN: [id number] DOB:[birth year], 58 y.o., female Today's Date: 10/23/2023  END OF SESSION:  PT End of Session - 10/23/23 1526     Visit Number 5    Number of Visits 24    Date for PT Re-Evaluation 12/13/23    PT Start Time 1515    PT Stop Time 1603    PT Time Calculation (min) 48 min             Past Medical History:  Diagnosis Date   Allergy    Anomaly, uterus    tumor of uterus   Anxiety    Bilateral occipital neuralgia    Bipolar 1 disorder (HCC)    Chronic back pain    Complication of anesthesia    hard time waking up after gastric bypass.Pt stated that she has a small mouth and has had several surgeries since gastric bypass with no problems.   Degenerative disc disease, lumbar    Depression    Facet syndrome, lumbar    Family history of breast cancer    2/21 cancer genetic testing letter sent   GERD (gastroesophageal reflux disease)    before gastric bypass   Headache    Migraines   History of kidney stones    Left   Hx of vertigo    Patella fracture    Left   PTSD (post-traumatic stress disorder)    Sleep apnea    C-PAP   Wears glasses    Past Surgical History:  Procedure Laterality Date   ABDOMINOPLASTY  2006   CARPAL TUNNEL RELEASE Left    CHOLECYSTECTOMY     COLONOSCOPY W/ POLYPECTOMY     CYSTOSCOPY W/ URETERAL STENT PLACEMENT  01/03/2017   Procedure: CYSTOSCOPY WITH RETROGRADE PYELOGRAM/URETERAL STENT PLACEMENT;  Surgeon: Bart Born, MD;  Location: [facility];  Service: Urology;;   CYSTOSCOPY W/ URETERAL STENT PLACEMENT Left 01/17/2017   Procedure: CYSTOSCOPY WITH STENT REPLACEMENT;  Surgeon: Bart Born, MD;  Location: [facility];  Service: Urology;  Laterality: Left;   DILATATION & CURETTAGE/HYSTEROSCOPY WITH MYOSURE N/A 07/08/2015   Procedure: DILATATION & CURETTAGE/HYSTEROSCOPY WITH MYOSURE;  Surgeon: Alben Alma, MD;  Location: [facility];  Service:  Gynecology;  Laterality: N/A;   DILATION AND CURETTAGE OF UTERUS     1/17   EXTRACORPOREAL SHOCK WAVE LITHOTRIPSY Left 12/07/2016   Procedure: EXTRACORPOREAL SHOCK WAVE LITHOTRIPSY (ESWL);  Surgeon: Bart Born, MD;  Location: [facility];  Service: Urology;  Laterality: Left;   GASTRIC BYPASS  2005   KNEE SURGERY Right    ACL- repair   LITHOTRIPSY     MIDDLE EAR SURGERY     NASAL SEPTUM SURGERY     OVARY SURGERY Right    ROTATOR CUFF REPAIR     URETEROSCOPY WITH HOLMIUM LASER LITHOTRIPSY     perforated ureter. 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   Encounter for BiPAP use counseling 08/08/2021   Mass of upper inner quadrant of left breast 07/19/2021   Vaginal atrophy 07/19/2021   Family history of breast cancer 07/19/2021   OSA on CPAP 04/19/2020   CPAP use counseling 04/19/2020   Nephrolithiasis  01/26/2017   Left ureteral stone 01/03/2017   Endometrial disorder 07/08/2015   Bilateral occipital neuralgia 03/02/2015   DDD (degenerative disc disease), lumbar 12/24/2014   Facet syndrome, lumbar 12/24/2014   Sacroiliac joint dysfunction 12/24/2014    PCP: Claudine Cullens, MD  REFERRING PROVIDER: Sheilah Denver   REFERRING DIAG: Z61.096E (ICD-10-CM) - Unspecified fracture of upper end of right humerus, subsequent encounter for fracture with routine healing  THERAPY DIAG:  Shoulder joint stiffness, right  Chronic right shoulder pain  Muscle weakness (generalized)  Abnormal posture  Rationale for Evaluation and Treatment: Rehabilitation  ONSET DATE: 07/30/23  Fall resulting in R proximal humerus fx  SUBJECTIVE:                                                                                                                                                                                       SUBJECTIVE STATEMENT: Pt. Fell on 2/17 while bring the trash bin in resulting in R proximal humerus fracture.  Pt. States the last x-ray revealed fracture is healed.  Pt. Reports 8/10 R shoulder pain at worst and demonstrates minimal movement.  Pt. Sleeping on back with R shoulder off the pillow.  Pt. 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  Pt. Is taking Oxy for pain and remains guarded with R shoulder movement.  Pt. States she has been doing pendulum ex.   Hand dominance: Right  PERTINENT HISTORY: Pt. Well known to PT clinic.  See MD notes.   PAIN:  Are you having pain? Yes: NPRS scale: 8/10 Pain location: R proximal shoulder Pain description: aching/ sharp Aggravating factors: movement Relieving factors: meds/ rest  PRECAUTIONS: Shoulder  RED FLAGS: None   WEIGHT BEARING RESTRICTIONS: No  FALLS:  Has patient fallen in last 6 months? Yes. Number of falls 1 with injury  LIVING ENVIRONMENT: Lives with: lives with their family and lives alone Has following equipment at home: None  OCCUPATION: Editor, commissioning at [employer]  PLOF: Independent  PATIENT GOALS: Increase R shoulder ROM/ strength/ return to [employer]  NEXT MD VISIT:   OBJECTIVE:  Note: Objective measures were completed at Evaluation unless otherwise noted.  DIAGNOSTIC FINDINGS:  See imaging  PATIENT SURVEYS:  Quick Dash 90.9% significant limitations  COGNITION: Overall cognitive status: Within functional limits for tasks assessed     SENSATION: WFL  POSTURE: Forward head/ rounded shoulder posture  UPPER EXTREMITY ROM:   Active ROM Right PROM eval Left AROM eval  Shoulder flexion 122 deg. 150 deg.  Shoulder extension    Shoulder abduction 81 deg. 150 deg.  Shoulder adduction    Shoulder internal rotation 74 deg.  85 deg.  Shoulder external  rotation 18 deg. 85 deg.  Elbow flexion [facility] WFL  Elbow extension    Wrist flexion    Wrist extension    Wrist ulnar deviation    Wrist radial deviation    Wrist pronation    Wrist supination limited   (Blank rows = not tested)  No AROM in R shoulder flexion/ abduction.  R forearm limited supination as compared to L.  UPPER EXTREMITY MMT:  MMT Right eval Left eval  Shoulder flexion Unable to test R UE   Shoulder extension    Shoulder abduction    Shoulder adduction    Shoulder internal rotation    Shoulder external rotation    Middle trapezius    Lower trapezius    Elbow flexion    Elbow extension    Wrist flexion    Wrist extension    Wrist ulnar deviation    Wrist radial deviation    Wrist pronation    Wrist supination    Grip strength (lbs) 10.6# 19.8%  (Blank rows = not tested)  SHOULDER SPECIAL TESTS: Not able to test at this time  JOINT MOBILITY TESTING:  Guarded movement.  Significant crepitus  PALPATION:  R/L mid-bicep circumference: 39 cm/ 28.5 cm.   10.5 cm difference.  Tenderness                                                                                                                             TREATMENT DATE: 10/23/2023  Subjective:  Pt. Reports 8/10 R shoulder pain at worst since last PT tx. Session and reports minimal pain currently at rest.  Pt. Reports compliance with HEP.   Pt. 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   Pt. States "I am making progress and having less pain" as compared to initial PT visits.     There.ex.:  Pulley ex.: flexion/ scaption/ abduction 10x each.  Pain tolerable range with cuing to try an actively hold R UE overhead.  Discussed R sh. Pain with reaching/ daily and school tasks.    Standing wall ladder: flexion 10x (marked with blue sticker).  Cuing to prevent L lateral lean.  B UBE: 2 min. F/b.  Easier with forward mvmt.    Standing RTB scap. Retraction 20x/ sh. Extension/ B  ER 20x.   Issued for HEP   Reassessment of R shoulder A/AROM in front of mirror (PT assist)- all planes.      Supine R shoulder A/AROM (all planes)- serratus punches 20x (challenged with maintaining R sh. At 90 deg. Flexion).  R shoulder flexion/ bicep curls 20x.  Moderate R shoulder/elbow crepitus noted. Cuing to maintain R forearm supination t/o movement.    Discussed/ updated HEP  Manual tx.:  Supine R shoulder AAROM (all planes).  Supine R shoulder AROM: 100 deg. Flexion.    Seated STM to R UT region/ no Hypervolt today.  Not today: L sidelying  R sh. Abduction/ horizontal adduction/ abduction A/AROM 20x each.  Pain tolerable range.      PATIENT EDUCATION: Education details: See HEP Person educated: Patient Education method: Explanation, Demonstration, and Handouts Education comprehension: verbalized understanding and returned demonstration  HOME EXERCISE PROGRAM: Access Code: [email or web address] URL: [email or web address] Date: 09/20/2023 Prepared by: Hazeline Lister  Exercises - Seated Bicep Curls with Bar  - 2 x daily - 7 x weekly - 2 sets - 10 reps - Seated Forearm Pronation and Supination AROM  - 2 x daily - 7 x weekly - 2 sets - 10 reps - Seated Shoulder Flexion AAROM with Pulley Behind  - 2 x daily - 7 x weekly - 2 sets - 10 reps - Seated Shoulder Scaption AAROM with Pulley at Side  - 2 x daily - 7 x weekly - 2 sets - 10 reps - Seated Shoulder Abduction AAROM with Pulley Behind  - 2 x daily - 7 x weekly - 2 sets - 10 reps - Circular Shoulder Pendulum with Table Support  - 2 x daily - 7 x weekly - 2 sets - 10 reps   Access Code: [email or web address] URL: [email or web address] Date: 10/23/2023 Prepared by: Hazeline Lister Exercises - Scapular Retraction with Resistance  - 1 x daily - 7 x weekly - 2 sets - 10 reps - Shoulder extension with resistance - Neutral  - 1 x daily - 7 x weekly - 2 sets - 10 reps - Shoulder External Rotation and Scapular Retraction with  Resistance  - 1 x daily - 7 x weekly - 2 sets - 10 reps  ASSESSMENT:  CLINICAL IMPRESSION: Pt. Presents with limited R shoulder flexion/ abduction AROM.  Pt. Has limited R shoulder strength and grip strength.  Pt. Showing slow but consistent improvement in R upper arm swelling as compared to L UE. Pt. Encouraged to complete ex. Program on a daily/ consistent basis in a pain tolerable range. Pt added resisted there.ex. with RTB today.   Pt. Will benefit from skilled PT services to increase R shoulder ROM/ strength to improve overhead reaching/ pain-free mobility.    OBJECTIVE IMPAIRMENTS: decreased activity tolerance, decreased mobility, decreased ROM, decreased strength, hypomobility, impaired flexibility, impaired UE functional use, improper body mechanics, postural dysfunction, and pain.   ACTIVITY LIMITATIONS: carrying, lifting, bathing, toileting, dressing, self feeding, reach over head, and hygiene/grooming  PARTICIPATION LIMITATIONS: cleaning, laundry, driving, shopping, community activity, and occupation  PERSONAL FACTORS: Fitness and Past/current experiences are also affecting patient's functional outcome.   REHAB POTENTIAL: Good  CLINICAL DECISION MAKING: Evolving/moderate complexity  EVALUATION COMPLEXITY: Moderate   GOALS: Goals reviewed with patient? Yes  SHORT TERM GOALS: Target date: 10/11/23  Pt. Independent with HEP to increase R shoulder PROM to [facility] as compared to L shoulder to progress to AROM/ ADL.   Baseline: see above Goal status: Not met  LONG TERM GOALS: Target date: 12/13/23  Pt. Will decrease QuickDASH to <50% to improve UE functional mobility.   Baseline:  90/9% Goal status: INITIAL  2.  Pt. Will increase R shoulder flexion AROM to >120 deg. To reaching overhead/ manage hair.   Baseline:  no AROM at time of evaluation Goal status: INITIAL  3.  Pt. Will present with 4/5 MMT in R UE to improve daily household/ work-related tasks.   Baseline:  No MMT at  time of evaluation Goal status: INITIAL  4.  Pt. Will report no R shoulder pain/limitations with daily tasks to improve return to PLOF.   Baseline:  R  shoulder pain/ limited.  Goal status: INITIAL   PLAN:  PT FREQUENCY: 2x/week  PT DURATION: 12 weeks  PLANNED INTERVENTIONS: 97110-Therapeutic exercises, 97530- Therapeutic activity, [id number]- Neuromuscular re-education, 97535- Self Care, 16109- Manual therapy, [id number]- Electrical stimulation (unattended), Patient/Family education, Joint mobilization, Cryotherapy, and Moist heat  PLAN FOR NEXT SESSION: Reassess R shoulder swelling/ ROM.   Lendell Quarry, PT, DPT # [id number] 10/23/2023, 4:30 PM

## 2023-10-24 NOTE — Therapy (Signed)
 OUTPATIENT PHYSICAL THERAPY SHOULDER TREATMENT  Patient Name: Mariah Reeves MRN: [id number] DOB:1966-02-07, 58 y.o., female Today's Date: 10/26/2023  END OF SESSION:  PT End of Session - 10/26/23 0855     Visit Number 6    Number of Visits 24    Date for PT Re-Evaluation 12/13/23    PT Start Time 0853    PT Stop Time 0931    PT Time Calculation (min) 38 min    Activity Tolerance Patient tolerated treatment well    Behavior During Therapy WFL for tasks assessed/performed            Past Medical History:  Diagnosis Date   Allergy    Anomaly, uterus    tumor of uterus   Anxiety    Bilateral occipital neuralgia    Bipolar 1 disorder (HCC)    Chronic back pain    Complication of anesthesia    hard time waking up after gastric bypass.Pt stated that she has a small mouth and has had several surgeries since gastric bypass with no problems.   Degenerative disc disease, lumbar    Depression    Facet syndrome, lumbar    Family history of breast cancer    2/21 cancer genetic testing letter sent   GERD (gastroesophageal reflux disease)    before gastric bypass   Headache    Migraines   History of kidney stones    Left   Hx of vertigo    Patella fracture    Left   PTSD (post-traumatic stress disorder)    Sleep apnea    C-PAP   Wears glasses    Past Surgical History:  Procedure Laterality Date   ABDOMINOPLASTY  2006   CARPAL TUNNEL RELEASE Left    CHOLECYSTECTOMY     COLONOSCOPY W/ POLYPECTOMY     CYSTOSCOPY W/ URETERAL STENT PLACEMENT  01/03/2017   Procedure: CYSTOSCOPY WITH RETROGRADE PYELOGRAM/URETERAL STENT PLACEMENT;  Surgeon: Bart Born, MD;  Location: [facility];  Service: Urology;;   CYSTOSCOPY W/ URETERAL STENT PLACEMENT Left 01/17/2017   Procedure: CYSTOSCOPY WITH STENT REPLACEMENT;  Surgeon: Bart Born, MD;  Location: [facility];  Service: Urology;  Laterality: Left;   DILATATION & CURETTAGE/HYSTEROSCOPY WITH MYOSURE N/A 07/08/2015   Procedure:  DILATATION & CURETTAGE/HYSTEROSCOPY WITH MYOSURE;  Surgeon: Alben Alma, MD;  Location: [facility];  Service: Gynecology;  Laterality: N/A;   DILATION AND CURETTAGE OF UTERUS     1/17   EXTRACORPOREAL SHOCK WAVE LITHOTRIPSY Left 12/07/2016   Procedure: EXTRACORPOREAL SHOCK WAVE LITHOTRIPSY (ESWL);  Surgeon: Bart Born, MD;  Location: [facility];  Service: Urology;  Laterality: Left;   GASTRIC BYPASS  2005   KNEE SURGERY Right    ACL- repair   LITHOTRIPSY     MIDDLE EAR SURGERY     NASAL SEPTUM SURGERY     OVARY SURGERY Right    ROTATOR CUFF REPAIR     URETEROSCOPY WITH HOLMIUM LASER LITHOTRIPSY     perforated ureter. 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   Encounter for BiPAP use counseling 08/08/2021   Mass of upper inner quadrant of left breast 07/19/2021   Vaginal atrophy 07/19/2021   Family history of  breast cancer 07/19/2021   OSA on CPAP 04/19/2020   CPAP use counseling 04/19/2020   Nephrolithiasis 01/26/2017   Left ureteral stone 01/03/2017   Endometrial disorder 07/08/2015   Bilateral occipital neuralgia 03/02/2015   DDD (degenerative disc disease), lumbar 12/24/2014   Facet syndrome, lumbar 12/24/2014   Sacroiliac joint dysfunction 12/24/2014    PCP: Claudine Cullens, MD  REFERRING PROVIDER: Sheilah Denver   REFERRING DIAG: [phone] (ICD-10-CM) - Unspecified fracture of upper end of right humerus, subsequent encounter for fracture with routine healing  THERAPY DIAG:  Shoulder joint stiffness, right  Chronic right shoulder pain  Muscle weakness (generalized)  Rationale for Evaluation and Treatment: Rehabilitation  ONSET DATE: 07/30/23  Fall  resulting in R proximal humerus fx  SUBJECTIVE:                                                                                                                                                                                      SUBJECTIVE STATEMENT: Pt. Fell on 2/17 while bring the trash bin in resulting in R proximal humerus fracture.  Pt. States the last x-ray revealed fracture is healed.  Pt. Reports 8/10 R shoulder pain at worst and demonstrates minimal movement.  Pt. Sleeping on back with R shoulder off the pillow.  Pt. 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  Pt. Is taking Oxy for pain and remains guarded with R shoulder movement.  Pt. States she has been doing pendulum ex.   Hand dominance: Right  PERTINENT HISTORY: Pt. Well known to PT clinic.  See MD notes.   PAIN:  Are you having pain? Yes: NPRS scale: 8/10 Pain location: R proximal shoulder Pain description: aching/ sharp Aggravating factors: movement Relieving factors: meds/ rest  PRECAUTIONS: Shoulder  RED FLAGS: None   WEIGHT BEARING RESTRICTIONS: No  FALLS:  Has patient fallen in last 6 months? Yes. Number of falls 1 with injury  LIVING ENVIRONMENT: Lives with: lives with their family and lives alone Has following equipment at home: None  OCCUPATION: Editor, commissioning at [employer]  PLOF: Independent  PATIENT GOALS: Increase R shoulder ROM/ strength/ return to [employer]  NEXT MD VISIT:   OBJECTIVE:  Note: Objective measures were completed at Evaluation unless otherwise noted.  DIAGNOSTIC FINDINGS:  See imaging  PATIENT SURVEYS:  Quick Dash 90.9% significant limitations  COGNITION: Overall cognitive status: Within functional limits for tasks assessed     SENSATION: WFL  POSTURE: Forward head/ rounded shoulder posture  UPPER EXTREMITY ROM:   Active ROM Right PROM eval Left AROM eval  Shoulder flexion 122 deg. 150 deg.  Shoulder extension    Shoulder abduction  81 deg.  150 deg.  Shoulder adduction    Shoulder internal rotation 74 deg. 85 deg.  Shoulder external rotation 18 deg. 85 deg.  Elbow flexion [facility] WFL  Elbow extension    Wrist flexion    Wrist extension    Wrist ulnar deviation    Wrist radial deviation    Wrist pronation    Wrist supination limited   (Blank rows = not tested)  No AROM in R shoulder flexion/ abduction.  R forearm limited supination as compared to L.  UPPER EXTREMITY MMT:  MMT Right eval Left eval  Shoulder flexion Unable to test R UE   Shoulder extension    Shoulder abduction    Shoulder adduction    Shoulder internal rotation    Shoulder external rotation    Middle trapezius    Lower trapezius    Elbow flexion    Elbow extension    Wrist flexion    Wrist extension    Wrist ulnar deviation    Wrist radial deviation    Wrist pronation    Wrist supination    Grip strength (lbs) 10.6# 19.8%  (Blank rows = not tested)  SHOULDER SPECIAL TESTS: Not able to test at this time  JOINT MOBILITY TESTING:  Guarded movement.  Significant crepitus  PALPATION:  R/L mid-bicep circumference: 39 cm/ 28.5 cm.   10.5 cm difference.  Tenderness                                                                                                                             TREATMENT DATE: 10/26/2023   Subjective:  Pt. reports 8/10 R shoulder pain upon arrival today. She reports compliance with HEP. She has been very fatigued recently but otherwise no specific questions or concerns.     There.ex.: Moist heat pack to R shoulder at start of session with patient in sitting during interval history x 3 minutes; Pulley ex.: flexion/ scaption/ abduction 10x each.  Pain tolerable range. Standing wall ladder: flexion 10x and scaption 5x (marked with heart sticker).  Standing R shoulder AAROM against gravity with therapist assist x 5; Standing R shoulder AAROM canes behind waist using LUE to gradually assist RUE across back; Supine R  shoulder A/AROM (all planes); Supine R shoulder serratus punches 10x (challenged with maintaining R sh. At 90 deg. Flexion).     Manual tx.: Supine R shoulder PROM into flexion, scaption, ER and IR; Supine STM to R UT region/ no Hypervolt today.   Not today: L sidelying R sh. Abduction/ horizontal adduction/ abduction A/AROM 20x each.  Pain tolerable range.      PATIENT EDUCATION: Education details: See HEP Person educated: Patient Education method: Explanation, Demonstration, and Handouts Education comprehension: verbalized understanding and returned demonstration  HOME EXERCISE PROGRAM: Access Code: [email or web address] URL: [email or web address] Date: 09/20/2023 Prepared by: Hazeline Lister  Exercises - Seated Bicep Curls with Bar  - 2 x daily - 7 x weekly - 2 sets -  10 reps - Seated Forearm Pronation and Supination AROM  - 2 x daily - 7 x weekly - 2 sets - 10 reps - Seated Shoulder Flexion AAROM with Pulley Behind  - 2 x daily - 7 x weekly - 2 sets - 10 reps - Seated Shoulder Scaption AAROM with Pulley at Side  - 2 x daily - 7 x weekly - 2 sets - 10 reps - Seated Shoulder Abduction AAROM with Pulley Behind  - 2 x daily - 7 x weekly - 2 sets - 10 reps - Circular Shoulder Pendulum with Table Support  - 2 x daily - 7 x weekly - 2 sets - 10 reps   Access Code: [email or web address] URL: [email or web address] Date: 10/23/2023 Prepared by: Hazeline Lister Exercises - Scapular Retraction with Resistance  - 1 x daily - 7 x weekly - 2 sets - 10 reps - Shoulder extension with resistance - Neutral  - 1 x daily - 7 x weekly - 2 sets - 10 reps - Shoulder External Rotation and Scapular Retraction with Resistance  - 1 x daily - 7 x weekly - 2 sets - 10 reps  ASSESSMENT:  CLINICAL IMPRESSION: Pt. Presents with ongoing limited R shoulder flexion/ abduction AROM.  Pt. Has limited R shoulder strength in both seated, standing, and supine positions. Progressed both strengthening and  manual techniques for improved range of motion and improved functional capacity.  Pt. showing slow but consistent improvement in R upper arm swelling as compared to L UE. Pt. Encouraged to complete ex. Program on a daily/ consistent basis in a pain tolerable range. She will benefit from skilled PT services to increase R shoulder ROM/ strength to improve overhead reaching/ pain-free mobility.    OBJECTIVE IMPAIRMENTS: decreased activity tolerance, decreased mobility, decreased ROM, decreased strength, hypomobility, impaired flexibility, impaired UE functional use, improper body mechanics, postural dysfunction, and pain.   ACTIVITY LIMITATIONS: carrying, lifting, bathing, toileting, dressing, self feeding, reach over head, and hygiene/grooming  PARTICIPATION LIMITATIONS: cleaning, laundry, driving, shopping, community activity, and occupation  PERSONAL FACTORS: Fitness and Past/current experiences are also affecting patient's functional outcome.   REHAB POTENTIAL: Good  CLINICAL DECISION MAKING: Evolving/moderate complexity  EVALUATION COMPLEXITY: Moderate   GOALS: Goals reviewed with patient? Yes  SHORT TERM GOALS: Target date: 10/11/23  Pt. Independent with HEP to increase R shoulder PROM to [facility] as compared to L shoulder to progress to AROM/ ADL.   Baseline: see above Goal status: Not met  LONG TERM GOALS: Target date: 12/13/23  Pt. Will decrease QuickDASH to <50% to improve UE functional mobility.   Baseline:  90/9% Goal status: INITIAL  2.  Pt. Will increase R shoulder flexion AROM to >120 deg. To reaching overhead/ manage hair.   Baseline:  no AROM at time of evaluation Goal status: INITIAL  3.  Pt. Will present with 4/5 MMT in R UE to improve daily household/ work-related tasks.   Baseline:  No MMT at time of evaluation Goal status: INITIAL  4.  Pt. Will report no R shoulder pain/limitations with daily tasks to improve return to PLOF.   Baseline:  R shoulder pain/ limited.   Goal status: INITIAL   PLAN:  PT FREQUENCY: 2x/week  PT DURATION: 12 weeks  PLANNED INTERVENTIONS: 97110-Therapeutic exercises, 97530- Therapeutic activity, [id number]- Neuromuscular re-education, 97535- Self Care, 54098- Manual therapy, G0283- Electrical stimulation (unattended), Patient/Family education, Joint mobilization, Cryotherapy, and Moist heat  PLAN FOR NEXT SESSION: Reassess R shoulder swelling/ ROM.   Darlyn Repsher D Kamilah Correia PT, DPT,  [facility]  10/26/2023, 10:46 AM

## 2023-10-25 ENCOUNTER — Ambulatory Visit: Admitting: Physical Therapy

## 2023-10-26 ENCOUNTER — Ambulatory Visit

## 2023-10-26 DIAGNOSIS — M25611 Stiffness of right shoulder, not elsewhere classified: Secondary | ICD-10-CM

## 2023-10-26 DIAGNOSIS — M6281 Muscle weakness (generalized): Secondary | ICD-10-CM

## 2023-10-26 DIAGNOSIS — G8929 Other chronic pain: Secondary | ICD-10-CM

## 2023-10-30 ENCOUNTER — Ambulatory Visit: Admitting: Physical Therapy

## 2023-10-30 ENCOUNTER — Encounter: Payer: Self-pay | Admitting: Physical Therapy

## 2023-10-30 DIAGNOSIS — R293 Abnormal posture: Secondary | ICD-10-CM

## 2023-10-30 DIAGNOSIS — G8929 Other chronic pain: Secondary | ICD-10-CM

## 2023-10-30 DIAGNOSIS — M6281 Muscle weakness (generalized): Secondary | ICD-10-CM

## 2023-10-30 DIAGNOSIS — M25611 Stiffness of right shoulder, not elsewhere classified: Secondary | ICD-10-CM | POA: Diagnosis not present

## 2023-10-30 NOTE — Therapy (Signed)
 OUTPATIENT PHYSICAL THERAPY SHOULDER TREATMENT  Patient Name: Mariah Reeves MRN: [id number] DOB:1966/03/10, 58 y.o., female Today's Date: 10/30/2023  END OF SESSION:  PT End of Session - 10/30/23 1513     Visit Number 7    Number of Visits 24    Date for PT Re-Evaluation 12/13/23    PT Start Time 1513    PT Stop Time 1604    PT Time Calculation (min) 51 min    Activity Tolerance Patient limited by pain    Behavior During Therapy WFL for tasks assessed/performed            Past Medical History:  Diagnosis Date   Allergy    Anomaly, uterus    tumor of uterus   Anxiety    Bilateral occipital neuralgia    Bipolar 1 disorder (HCC)    Chronic back pain    Complication of anesthesia    hard time waking up after gastric bypass.Pt stated that she has a small mouth and has had several surgeries since gastric bypass with no problems.   Degenerative disc disease, lumbar    Depression    Facet syndrome, lumbar    Family history of breast cancer    2/21 cancer genetic testing letter sent   GERD (gastroesophageal reflux disease)    before gastric bypass   Headache    Migraines   History of kidney stones    Left   Hx of vertigo    Patella fracture    Left   PTSD (post-traumatic stress disorder)    Sleep apnea    C-PAP   Wears glasses    Past Surgical History:  Procedure Laterality Date   ABDOMINOPLASTY  2006   CARPAL TUNNEL RELEASE Left    CHOLECYSTECTOMY     COLONOSCOPY W/ POLYPECTOMY     CYSTOSCOPY W/ URETERAL STENT PLACEMENT  01/03/2017   Procedure: CYSTOSCOPY WITH RETROGRADE PYELOGRAM/URETERAL STENT PLACEMENT;  Surgeon: Bart Born, MD;  Location: [facility];  Service: Urology;;   CYSTOSCOPY W/ URETERAL STENT PLACEMENT Left 01/17/2017   Procedure: CYSTOSCOPY WITH STENT REPLACEMENT;  Surgeon: Bart Born, MD;  Location: [facility];  Service: Urology;  Laterality: Left;   DILATATION & CURETTAGE/HYSTEROSCOPY WITH MYOSURE N/A 07/08/2015   Procedure: DILATATION &  CURETTAGE/HYSTEROSCOPY WITH MYOSURE;  Surgeon: Alben Alma, MD;  Location: [facility];  Service: Gynecology;  Laterality: N/A;   DILATION AND CURETTAGE OF UTERUS     1/17   EXTRACORPOREAL SHOCK WAVE LITHOTRIPSY Left 12/07/2016   Procedure: EXTRACORPOREAL SHOCK WAVE LITHOTRIPSY (ESWL);  Surgeon: Bart Born, MD;  Location: [facility];  Service: Urology;  Laterality: Left;   GASTRIC BYPASS  2005   KNEE SURGERY Right    ACL- repair   LITHOTRIPSY     MIDDLE EAR SURGERY     NASAL SEPTUM SURGERY     OVARY SURGERY Right    ROTATOR CUFF REPAIR     URETEROSCOPY WITH HOLMIUM LASER LITHOTRIPSY     perforated ureter. 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   Encounter for BiPAP use counseling 08/08/2021   Mass of upper inner quadrant of left breast 07/19/2021   Vaginal atrophy 07/19/2021   Family history of  breast cancer 07/19/2021   OSA on CPAP 04/19/2020   CPAP use counseling 04/19/2020   Nephrolithiasis 01/26/2017   Left ureteral stone 01/03/2017   Endometrial disorder 07/08/2015   Bilateral occipital neuralgia 03/02/2015   DDD (degenerative disc disease), lumbar 12/24/2014   Facet syndrome, lumbar 12/24/2014   Sacroiliac joint dysfunction 12/24/2014    PCP: Claudine Cullens, MD  REFERRING PROVIDER: Sheilah Denver   REFERRING DIAG: [phone] (ICD-10-CM) - Unspecified fracture of upper end of right humerus, subsequent encounter for fracture with routine healing  THERAPY DIAG:  Shoulder joint stiffness, right  Chronic right shoulder pain  Muscle weakness (generalized)  Abnormal posture  Rationale for Evaluation and Treatment: Rehabilitation  ONSET DATE: 07/30/23  Fall  resulting in R proximal humerus fx  SUBJECTIVE:                                                                                                                                                                                      SUBJECTIVE STATEMENT: Pt. Fell on 2/17 while bring the trash bin in resulting in R proximal humerus fracture.  Pt. States the last x-ray revealed fracture is healed.  Pt. Reports 8/10 R shoulder pain at worst and demonstrates minimal movement.  Pt. Sleeping on back with R shoulder off the pillow.  Pt. 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  Pt. Is taking Oxy for pain and remains guarded with R shoulder movement.  Pt. States she has been doing pendulum ex.   Hand dominance: Right  PERTINENT HISTORY: Pt. Well known to PT clinic.  See MD notes.   PAIN:  Are you having pain? Yes: NPRS scale: 8/10 Pain location: R proximal shoulder Pain description: aching/ sharp Aggravating factors: movement Relieving factors: meds/ rest  PRECAUTIONS: Shoulder  RED FLAGS: None   WEIGHT BEARING RESTRICTIONS: No  FALLS:  Has patient fallen in last 6 months? Yes. Number of falls 1 with injury  LIVING ENVIRONMENT: Lives with: lives with their family and lives alone Has following equipment at home: None  OCCUPATION: Editor, commissioning at [employer]  PLOF: Independent  PATIENT GOALS: Increase R shoulder ROM/ strength/ return to [employer]  NEXT MD VISIT:   OBJECTIVE:  Note: Objective measures were completed at Evaluation unless otherwise noted.  DIAGNOSTIC FINDINGS:  See imaging  PATIENT SURVEYS:  Quick Dash 90.9% significant limitations  COGNITION: Overall cognitive status: Within functional limits for tasks assessed     SENSATION: WFL  POSTURE: Forward head/ rounded shoulder posture  UPPER EXTREMITY ROM:   Active ROM Right PROM eval Left AROM eval  Shoulder flexion 122 deg. 150 deg.  Shoulder extension  Shoulder abduction 81 deg.  150 deg.  Shoulder adduction    Shoulder internal rotation 74 deg. 85 deg.  Shoulder external rotation 18 deg. 85 deg.  Elbow flexion [facility] WFL  Elbow extension    Wrist flexion    Wrist extension    Wrist ulnar deviation    Wrist radial deviation    Wrist pronation    Wrist supination limited   (Blank rows = not tested)  No AROM in R shoulder flexion/ abduction.  R forearm limited supination as compared to L.  UPPER EXTREMITY MMT:  MMT Right eval Left eval  Shoulder flexion Unable to test R UE   Shoulder extension    Shoulder abduction    Shoulder adduction    Shoulder internal rotation    Shoulder external rotation    Middle trapezius    Lower trapezius    Elbow flexion    Elbow extension    Wrist flexion    Wrist extension    Wrist ulnar deviation    Wrist radial deviation    Wrist pronation    Wrist supination    Grip strength (lbs) 10.6# 19.8%  (Blank rows = not tested)  SHOULDER SPECIAL TESTS: Not able to test at this time  JOINT MOBILITY TESTING:  Guarded movement.  Significant crepitus  PALPATION:  R/L mid-bicep circumference: 39 cm/ 28.5 cm.   10.5 cm difference.  Tenderness                                                                                                                             TREATMENT DATE: 10/30/2023  Subjective:  Pt. reports 9/10 R shoulder pain upon arrival today.  Pt. States she is out of pain medications.  Pt. States her R shoulder was yanked yesterday while opening door at school and another teacher opened with door on the other side.  She reports compliance with HEP. She has been very fatigued over past couple days.     There.ex.:  Pulley ex.: flexion/ scaption/ abduction 10x each.  Pain tolerable range.  Standing wall ladder: flexion 10x and scaption 5x (marked with heart sticker)- no change from last tx. session.   Standing R shoulder AAROM against gravity with therapist assist x 5; Standing R shoulder AAROM canes  behind waist using LUE to gradually assist RUE across back; sh. Extension/ abduction with wand.  Supine R shoulder A/AROM (all planes); Supine R shoulder serratus punches 10x (challenged with maintaining R sh. At 90 deg. Flexion).    Supine R shoulder gentle isometrics (all planes)- cuing to avoid pain.    Manual tx.:  Supine R shoulder PROM into flexion, scaption, ER and IR;  Supine STM to R UT region/ no Hypervolt today.   Not today: L sidelying R sh. Abduction/ horizontal adduction/ abduction A/AROM 20x each.  Pain tolerable range.      PATIENT EDUCATION: Education details: See HEP Person educated: Patient Education method: Explanation, Facilities manager, and Handouts Education  comprehension: verbalized understanding and returned demonstration  HOME EXERCISE PROGRAM: Access Code: [email or web address] URL: [email or web address] Date: 09/20/2023 Prepared by: Hazeline Lister  Exercises - Seated Bicep Curls with Bar  - 2 x daily - 7 x weekly - 2 sets - 10 reps - Seated Forearm Pronation and Supination AROM  - 2 x daily - 7 x weekly - 2 sets - 10 reps - Seated Shoulder Flexion AAROM with Pulley Behind  - 2 x daily - 7 x weekly - 2 sets - 10 reps - Seated Shoulder Scaption AAROM with Pulley at Side  - 2 x daily - 7 x weekly - 2 sets - 10 reps - Seated Shoulder Abduction AAROM with Pulley Behind  - 2 x daily - 7 x weekly - 2 sets - 10 reps - Circular Shoulder Pendulum with Table Support  - 2 x daily - 7 x weekly - 2 sets - 10 reps   Access Code: [email or web address] URL: [email or web address] Date: 10/23/2023 Prepared by: Hazeline Lister Exercises - Scapular Retraction with Resistance  - 1 x daily - 7 x weekly - 2 sets - 10 reps - Shoulder extension with resistance - Neutral  - 1 x daily - 7 x weekly - 2 sets - 10 reps - Shoulder External Rotation and Scapular Retraction with Resistance  - 1 x daily - 7 x weekly - 2 sets - 10 reps  ASSESSMENT:  CLINICAL IMPRESSION: Pt.  Showing progress with R shoulder A/PROM in all positions.  Moderate R shoulder crepitus/ pain with flexion/ abduction.  Pt. limited with R shoulder strength and PT started gentle isometrics in supine position. Pt. showing slow but consistent improvement in R upper arm swelling as compared to L UE. Pt. Encouraged to complete ex. Program on a daily/ consistent basis in a pain tolerable range. She will benefit from skilled PT services to increase R shoulder ROM/ strength to improve overhead reaching/ pain-free mobility.    OBJECTIVE IMPAIRMENTS: decreased activity tolerance, decreased mobility, decreased ROM, decreased strength, hypomobility, impaired flexibility, impaired UE functional use, improper body mechanics, postural dysfunction, and pain.   ACTIVITY LIMITATIONS: carrying, lifting, bathing, toileting, dressing, self feeding, reach over head, and hygiene/grooming  PARTICIPATION LIMITATIONS: cleaning, laundry, driving, shopping, community activity, and occupation  PERSONAL FACTORS: Fitness and Past/current experiences are also affecting patient's functional outcome.   REHAB POTENTIAL: Good  CLINICAL DECISION MAKING: Evolving/moderate complexity  EVALUATION COMPLEXITY: Moderate   GOALS: Goals reviewed with patient? Yes  SHORT TERM GOALS: Target date: 10/11/23  Pt. Independent with HEP to increase R shoulder PROM to [facility] as compared to L shoulder to progress to AROM/ ADL.   Baseline: see above Goal status: Not met  LONG TERM GOALS: Target date: 12/13/23  Pt. Will decrease QuickDASH to <50% to improve UE functional mobility.   Baseline:  90/9% Goal status: INITIAL  2.  Pt. Will increase R shoulder flexion AROM to >120 deg. To reaching overhead/ manage hair.   Baseline:  no AROM at time of evaluation Goal status: INITIAL  3.  Pt. Will present with 4/5 MMT in R UE to improve daily household/ work-related tasks.   Baseline:  No MMT at time of evaluation Goal status: INITIAL  4.  Pt.  Will report no R shoulder pain/limitations with daily tasks to improve return to PLOF.   Baseline:  R shoulder pain/ limited.  Goal status: INITIAL   PLAN:  PT FREQUENCY: 2x/week  PT DURATION: 12 weeks  PLANNED INTERVENTIONS: 97110-Therapeutic exercises, 97530- Therapeutic activity, [id number]- Neuromuscular  re-education, [phone]- Self Care, 60454- Manual therapy, G0283- Electrical stimulation (unattended), Patient/Family education, Joint mobilization, Cryotherapy, and Moist heat  PLAN FOR NEXT SESSION: Issue R shoulder isometrics for HEP/ check schedule   Lendell Quarry, PT, DPT # [phone] 10/30/2023, 6:52 PM

## 2023-11-01 ENCOUNTER — Encounter: Payer: Self-pay | Admitting: Physical Therapy

## 2023-11-01 ENCOUNTER — Ambulatory Visit: Admitting: Physical Therapy

## 2023-11-01 DIAGNOSIS — G8929 Other chronic pain: Secondary | ICD-10-CM

## 2023-11-01 DIAGNOSIS — M25611 Stiffness of right shoulder, not elsewhere classified: Secondary | ICD-10-CM

## 2023-11-01 DIAGNOSIS — R293 Abnormal posture: Secondary | ICD-10-CM

## 2023-11-01 DIAGNOSIS — M6281 Muscle weakness (generalized): Secondary | ICD-10-CM

## 2023-11-01 NOTE — Therapy (Signed)
 OUTPATIENT PHYSICAL THERAPY SHOULDER TREATMENT  Patient Name: Mariah Reeves MRN: [id number] DOB:1965-12-23, 58 y.o., female Today's Date: 11/01/2023  END OF SESSION:  PT End of Session - 11/01/23 1527     Visit Number 8    Number of Visits 24    Date for PT Re-Evaluation 12/13/23    PT Start Time 1523    PT Stop Time 1604    PT Time Calculation (min) 41 min    Activity Tolerance Patient limited by pain    Behavior During Therapy [facility] for tasks assessed/performed            Past Medical History:  Diagnosis Date   Allergy    Anomaly, uterus    tumor of uterus   Anxiety    Bilateral occipital neuralgia    Bipolar 1 disorder (HCC)    Chronic back pain    Complication of anesthesia    hard time waking up after gastric bypass.Pt stated that she has a small mouth and has had several surgeries since gastric bypass with no problems.   Degenerative disc disease, lumbar    Depression    Facet syndrome, lumbar    Family history of breast cancer    2/21 cancer genetic testing letter sent   GERD (gastroesophageal reflux disease)    before gastric bypass   Headache    Migraines   History of kidney stones    Left   Hx of vertigo    Patella fracture    Left   PTSD (post-traumatic stress disorder)    Sleep apnea    C-PAP   Wears glasses    Past Surgical History:  Procedure Laterality Date   ABDOMINOPLASTY  2006   CARPAL TUNNEL RELEASE Left    CHOLECYSTECTOMY     COLONOSCOPY W/ POLYPECTOMY     CYSTOSCOPY W/ URETERAL STENT PLACEMENT  01/03/2017   Procedure: CYSTOSCOPY WITH RETROGRADE PYELOGRAM/URETERAL STENT PLACEMENT;  Surgeon: Bart Born, MD;  Location: [facility];  Service: Urology;;   CYSTOSCOPY W/ URETERAL STENT PLACEMENT Left 01/17/2017   Procedure: CYSTOSCOPY WITH STENT REPLACEMENT;  Surgeon: Bart Born, MD;  Location: [facility];  Service: Urology;  Laterality: Left;   DILATATION & CURETTAGE/HYSTEROSCOPY WITH MYOSURE N/A 07/08/2015   Procedure: DILATATION &  CURETTAGE/HYSTEROSCOPY WITH MYOSURE;  Surgeon: Alben Alma, MD;  Location: [facility];  Service: Gynecology;  Laterality: N/A;   DILATION AND CURETTAGE OF UTERUS     1/17   EXTRACORPOREAL SHOCK WAVE LITHOTRIPSY Left 12/07/2016   Procedure: EXTRACORPOREAL SHOCK WAVE LITHOTRIPSY (ESWL);  Surgeon: Bart Born, MD;  Location: [facility];  Service: Urology;  Laterality: Left;   GASTRIC BYPASS  2005   KNEE SURGERY Right    ACL- repair   LITHOTRIPSY     MIDDLE EAR SURGERY     NASAL SEPTUM SURGERY     OVARY SURGERY Right    ROTATOR CUFF REPAIR     URETEROSCOPY WITH HOLMIUM LASER LITHOTRIPSY     perforated ureter. 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   Encounter for BiPAP use counseling 08/08/2021   Mass of upper inner quadrant of left breast 07/19/2021   Vaginal atrophy 07/19/2021   Family history of  breast cancer 07/19/2021   OSA on CPAP 04/19/2020   CPAP use counseling 04/19/2020   Nephrolithiasis 01/26/2017   Left ureteral stone 01/03/2017   Endometrial disorder 07/08/2015   Bilateral occipital neuralgia 03/02/2015   DDD (degenerative disc disease), lumbar 12/24/2014   Facet syndrome, lumbar 12/24/2014   Sacroiliac joint dysfunction 12/24/2014    PCP: Claudine Cullens, MD  REFERRING PROVIDER: Sheilah Denver   REFERRING DIAG: [phone] (ICD-10-CM) - Unspecified fracture of upper end of right humerus, subsequent encounter for fracture with routine healing  THERAPY DIAG:  Shoulder joint stiffness, right  Chronic right shoulder pain  Muscle weakness (generalized)  Abnormal posture  Rationale for Evaluation and Treatment: Rehabilitation  ONSET DATE: 07/30/23  Fall  resulting in R proximal humerus fx  SUBJECTIVE:                                                                                                                                                                                      SUBJECTIVE STATEMENT: Pt. Fell on 2/17 while bring the trash bin in resulting in R proximal humerus fracture.  Pt. States the last x-ray revealed fracture is healed.  Pt. Reports 8/10 R shoulder pain at worst and demonstrates minimal movement.  Pt. Sleeping on back with R shoulder off the pillow.  Pt. 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  Pt. Is taking Oxy for pain and remains guarded with R shoulder movement.  Pt. States she has been doing pendulum ex.   Hand dominance: Right  PERTINENT HISTORY: Pt. Well known to PT clinic.  See MD notes.   PAIN:  Are you having pain? Yes: NPRS scale: 8/10 Pain location: R proximal shoulder Pain description: aching/ sharp Aggravating factors: movement Relieving factors: meds/ rest  PRECAUTIONS: Shoulder  RED FLAGS: None   WEIGHT BEARING RESTRICTIONS: No  FALLS:  Has patient fallen in last 6 months? Yes. Number of falls 1 with injury  LIVING ENVIRONMENT: Lives with: lives with their family and lives alone Has following equipment at home: None  OCCUPATION: Editor, commissioning at [employer]  PLOF: Independent  PATIENT GOALS: Increase R shoulder ROM/ strength/ return to [employer]  NEXT MD VISIT:   OBJECTIVE:  Note: Objective measures were completed at Evaluation unless otherwise noted.  DIAGNOSTIC FINDINGS:  See imaging  PATIENT SURVEYS:  Quick Dash 90.9% significant limitations  COGNITION: Overall cognitive status: Within functional limits for tasks assessed     SENSATION: WFL  POSTURE: Forward head/ rounded shoulder posture  UPPER EXTREMITY ROM:   Active ROM Right PROM eval Left AROM eval  Shoulder flexion 122 deg. 150 deg.  Shoulder extension  Shoulder abduction 81 deg.  150 deg.  Shoulder adduction    Shoulder internal rotation 74 deg. 85 deg.  Shoulder external rotation 18 deg. 85 deg.  Elbow flexion [facility] WFL  Elbow extension    Wrist flexion    Wrist extension    Wrist ulnar deviation    Wrist radial deviation    Wrist pronation    Wrist supination limited   (Blank rows = not tested)  No AROM in R shoulder flexion/ abduction.  R forearm limited supination as compared to L.  UPPER EXTREMITY MMT:  MMT Right eval Left eval  Shoulder flexion Unable to test R UE   Shoulder extension    Shoulder abduction    Shoulder adduction    Shoulder internal rotation    Shoulder external rotation    Middle trapezius    Lower trapezius    Elbow flexion    Elbow extension    Wrist flexion    Wrist extension    Wrist ulnar deviation    Wrist radial deviation    Wrist pronation    Wrist supination    Grip strength (lbs) 10.6# 19.8%  (Blank rows = not tested)  SHOULDER SPECIAL TESTS: Not able to test at this time  JOINT MOBILITY TESTING:  Guarded movement.  Significant crepitus  PALPATION:  R/L mid-bicep circumference: 39 cm/ 28.5 cm.   10.5 cm difference.  Tenderness                                                                                                                             TREATMENT DATE: 11/01/2023  Subjective:  Pt. reports 7/10 R shoulder pain upon arrival today.  Pt. States she is out of pain medications.  Pt. Has to have her 1st infusion tomorrow afternoon.     There.ex.:  Standing wall ladder: flexion 10x (marked with heart sticker)- 1 rung improvement since last tx. session.   Supine R shoulder gentle isometrics (all planes)- cuing to avoid pain.  Difficulty with ER.  Good bicep/tricep isometrics.  Supine R shoulder A/AROM (all planes); Supine R shoulder serratus punches 10x (challenged with maintaining R sh. At 90 deg. Flexion).  R shoulder horizontal abduction/ adduction.   Supine B shoulder chest press with wand/  sh. Circles (CW/CCW).  Standing R shoulder AAROM against gravity with therapist assist x 5; Standing R shoulder AAROM canes behind waist using LUE to gradually assist RUE across back; sh. Extension/ abduction with wand.  Manual tx.:  Supine R shoulder AA/PROM into flexion, scaption, ER and IR 10x each with static holds.    Supine STM to R UT region/ no Hypervolt today.   Not today: L sidelying R sh. Abduction/ horizontal adduction/ abduction A/AROM 20x each.  Pain tolerable range.      PATIENT EDUCATION: Education details: See HEP Person educated: Patient Education method: Explanation, Demonstration, and Handouts Education comprehension: verbalized understanding and returned demonstration  HOME EXERCISE PROGRAM: Access Code: [email or web address] URL:  [email or web address] Date: 09/20/2023 Prepared by: Hazeline Lister  Exercises - Seated Bicep Curls with Bar  - 2 x daily - 7 x weekly - 2 sets - 10 reps - Seated Forearm Pronation and Supination AROM  - 2 x daily - 7 x weekly - 2 sets - 10 reps - Seated Shoulder Flexion AAROM with Pulley Behind  - 2 x daily - 7 x weekly - 2 sets - 10 reps - Seated Shoulder Scaption AAROM with Pulley at Side  - 2 x daily - 7 x weekly - 2 sets - 10 reps - Seated Shoulder Abduction AAROM with Pulley Behind  - 2 x daily - 7 x weekly - 2 sets - 10 reps - Circular Shoulder Pendulum with Table Support  - 2 x daily - 7 x weekly - 2 sets - 10 reps   Access Code: [email or web address] URL: [email or web address] Date: 10/23/2023 Prepared by: Hazeline Lister Exercises - Scapular Retraction with Resistance  - 1 x daily - 7 x weekly - 2 sets - 10 reps - Shoulder extension with resistance - Neutral  - 1 x daily - 7 x weekly - 2 sets - 10 reps - Shoulder External Rotation and Scapular Retraction with Resistance  - 1 x daily - 7 x weekly - 2 sets - 10 reps  ASSESSMENT:  CLINICAL IMPRESSION: Pt. Showing progress with R shoulder A/PROM in all positions.   Moderate R shoulder crepitus/ pain with flexion/ abduction.  Pt. limited with R shoulder strength and PT slowly progress resisted/isometric tasks.  Pt. Encouraged to complete ex. Program on a daily/ consistent basis in a pain tolerable range.  Pt. Demonstrates a slight increase in R shoulder flexion, esp. At wall ladder.  She will benefit from skilled PT services to increase R shoulder ROM/ strength to improve overhead reaching/ pain-free mobility.    OBJECTIVE IMPAIRMENTS: decreased activity tolerance, decreased mobility, decreased ROM, decreased strength, hypomobility, impaired flexibility, impaired UE functional use, improper body mechanics, postural dysfunction, and pain.   ACTIVITY LIMITATIONS: carrying, lifting, bathing, toileting, dressing, self feeding, reach over head, and hygiene/grooming  PARTICIPATION LIMITATIONS: cleaning, laundry, driving, shopping, community activity, and occupation  PERSONAL FACTORS: Fitness and Past/current experiences are also affecting patient's functional outcome.   REHAB POTENTIAL: Good  CLINICAL DECISION MAKING: Evolving/moderate complexity  EVALUATION COMPLEXITY: Moderate   GOALS: Goals reviewed with patient? Yes  SHORT TERM GOALS: Target date: 10/11/23  Pt. Independent with HEP to increase R shoulder PROM to [facility] as compared to L shoulder to progress to AROM/ ADL.   Baseline: see above Goal status: Not met  LONG TERM GOALS: Target date: 12/13/23  Pt. Will decrease QuickDASH to <50% to improve UE functional mobility.   Baseline:  90/9% Goal status: INITIAL  2.  Pt. Will increase R shoulder flexion AROM to >120 deg. To reaching overhead/ manage hair.   Baseline:  no AROM at time of evaluation Goal status: INITIAL  3.  Pt. Will present with 4/5 MMT in R UE to improve daily household/ work-related tasks.   Baseline:  No MMT at time of evaluation Goal status: INITIAL  4.  Pt. Will report no R shoulder pain/limitations with daily tasks to improve  return to PLOF.   Baseline:  R shoulder pain/ limited.  Goal status: INITIAL   PLAN:  PT FREQUENCY: 2x/week  PT DURATION: 12 weeks  PLANNED INTERVENTIONS: 97110-Therapeutic exercises, 97530- Therapeutic activity, [id number]- Neuromuscular re-education, 97535- Self Care, 54098- Manual therapy, G0283- Electrical stimulation (unattended), Patient/Family education, Joint mobilization, Cryotherapy,  and Moist heat  PLAN FOR NEXT SESSION: Issue R shoulder isometrics for HEP/ check schedule (PT going to [place] from 6/10 to 6/25)   Lendell Quarry, PT, DPT # [phone] 11/01/2023, 6:33 PM

## 2023-11-06 ENCOUNTER — Other Ambulatory Visit: Payer: Self-pay | Admitting: Family Medicine

## 2023-11-06 ENCOUNTER — Ambulatory Visit: Admitting: Physical Therapy

## 2023-11-06 ENCOUNTER — Encounter: Payer: Self-pay | Admitting: Physical Therapy

## 2023-11-06 DIAGNOSIS — M25611 Stiffness of right shoulder, not elsewhere classified: Secondary | ICD-10-CM

## 2023-11-06 DIAGNOSIS — G8929 Other chronic pain: Secondary | ICD-10-CM

## 2023-11-06 DIAGNOSIS — M6281 Muscle weakness (generalized): Secondary | ICD-10-CM

## 2023-11-06 DIAGNOSIS — R293 Abnormal posture: Secondary | ICD-10-CM

## 2023-11-06 DIAGNOSIS — Z1231 Encounter for screening mammogram for malignant neoplasm of breast: Secondary | ICD-10-CM

## 2023-11-06 NOTE — Therapy (Signed)
 OUTPATIENT PHYSICAL THERAPY SHOULDER TREATMENT  Patient Name: Mariah Reeves MRN: [id number] DOB:Nov 13, 1965, 58 y.o., female Today's Date: 11/06/2023  END OF SESSION:  PT End of Session - 11/06/23 1552     Visit Number 9    Number of Visits 24    Date for PT Re-Evaluation 12/13/23    PT Start Time 1558    PT Stop Time 1646    PT Time Calculation (min) 48 min    Activity Tolerance Patient limited by pain    Behavior During Therapy [facility] for tasks assessed/performed            Past Medical History:  Diagnosis Date   Allergy    Anomaly, uterus    tumor of uterus   Anxiety    Bilateral occipital neuralgia    Bipolar 1 disorder (HCC)    Chronic back pain    Complication of anesthesia    hard time waking up after gastric bypass.Pt stated that she has a small mouth and has had several surgeries since gastric bypass with no problems.   Degenerative disc disease, lumbar    Depression    Facet syndrome, lumbar    Family history of breast cancer    2/21 cancer genetic testing letter sent   GERD (gastroesophageal reflux disease)    before gastric bypass   Headache    Migraines   History of kidney stones    Left   Hx of vertigo    Patella fracture    Left   PTSD (post-traumatic stress disorder)    Sleep apnea    C-PAP   Wears glasses    Past Surgical History:  Procedure Laterality Date   ABDOMINOPLASTY  2006   CARPAL TUNNEL RELEASE Left    CHOLECYSTECTOMY     COLONOSCOPY W/ POLYPECTOMY     CYSTOSCOPY W/ URETERAL STENT PLACEMENT  01/03/2017   Procedure: CYSTOSCOPY WITH RETROGRADE PYELOGRAM/URETERAL STENT PLACEMENT;  Surgeon: Bart Born, MD;  Location: [facility];  Service: Urology;;   CYSTOSCOPY W/ URETERAL STENT PLACEMENT Left 01/17/2017   Procedure: CYSTOSCOPY WITH STENT REPLACEMENT;  Surgeon: Bart Born, MD;  Location: [facility];  Service: Urology;  Laterality: Left;   DILATATION & CURETTAGE/HYSTEROSCOPY WITH MYOSURE N/A 07/08/2015   Procedure: DILATATION &  CURETTAGE/HYSTEROSCOPY WITH MYOSURE;  Surgeon: Alben Alma, MD;  Location: [facility];  Service: Gynecology;  Laterality: N/A;   DILATION AND CURETTAGE OF UTERUS     1/17   EXTRACORPOREAL SHOCK WAVE LITHOTRIPSY Left 12/07/2016   Procedure: EXTRACORPOREAL SHOCK WAVE LITHOTRIPSY (ESWL);  Surgeon: Bart Born, MD;  Location: [facility];  Service: Urology;  Laterality: Left;   GASTRIC BYPASS  2005   KNEE SURGERY Right    ACL- repair   LITHOTRIPSY     MIDDLE EAR SURGERY     NASAL SEPTUM SURGERY     OVARY SURGERY Right    ROTATOR CUFF REPAIR     URETEROSCOPY WITH HOLMIUM LASER LITHOTRIPSY     perforated ureter. 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   Encounter for BiPAP use counseling 08/08/2021   Mass of upper inner quadrant of left breast 07/19/2021   Vaginal atrophy 07/19/2021   Family history of  breast cancer 07/19/2021   OSA on CPAP 04/19/2020   CPAP use counseling 04/19/2020   Nephrolithiasis 01/26/2017   Left ureteral stone 01/03/2017   Endometrial disorder 07/08/2015   Bilateral occipital neuralgia 03/02/2015   DDD (degenerative disc disease), lumbar 12/24/2014   Facet syndrome, lumbar 12/24/2014   Sacroiliac joint dysfunction 12/24/2014    PCP: Claudine Cullens, MD  REFERRING PROVIDER: Sheilah Denver   REFERRING DIAG: [phone] (ICD-10-CM) - Unspecified fracture of upper end of right humerus, subsequent encounter for fracture with routine healing  THERAPY DIAG:  Shoulder joint stiffness, right  Chronic right shoulder pain  Muscle weakness (generalized)  Abnormal posture  Rationale for Evaluation and Treatment: Rehabilitation  ONSET DATE: 07/30/23  Fall  resulting in R proximal humerus fx  SUBJECTIVE:                                                                                                                                                                                      SUBJECTIVE STATEMENT: Pt. Fell on 2/17 while bring the trash bin in resulting in R proximal humerus fracture.  Pt. States the last x-ray revealed fracture is healed.  Pt. Reports 8/10 R shoulder pain at worst and demonstrates minimal movement.  Pt. Sleeping on back with R shoulder off the pillow.  Pt. 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  Pt. Is taking Oxy for pain and remains guarded with R shoulder movement.  Pt. States she has been doing pendulum ex.   Hand dominance: Right  PERTINENT HISTORY: Pt. Well known to PT clinic.  See MD notes.   PAIN:  Are you having pain? Yes: NPRS scale: 8/10 Pain location: R proximal shoulder Pain description: aching/ sharp Aggravating factors: movement Relieving factors: meds/ rest  PRECAUTIONS: Shoulder  RED FLAGS: None   WEIGHT BEARING RESTRICTIONS: No  FALLS:  Has patient fallen in last 6 months? Yes. Number of falls 1 with injury  LIVING ENVIRONMENT: Lives with: lives with their family and lives alone Has following equipment at home: None  OCCUPATION: Editor, commissioning at [employer]  PLOF: Independent  PATIENT GOALS: Increase R shoulder ROM/ strength/ return to [employer]  NEXT MD VISIT:   OBJECTIVE:  Note: Objective measures were completed at Evaluation unless otherwise noted.  DIAGNOSTIC FINDINGS:  See imaging  PATIENT SURVEYS:  Quick Dash 90.9% significant limitations  COGNITION: Overall cognitive status: Within functional limits for tasks assessed     SENSATION: WFL  POSTURE: Forward head/ rounded shoulder posture  UPPER EXTREMITY ROM:   Active ROM Right PROM eval Left AROM eval  Shoulder flexion 122 deg. 150 deg.  Shoulder extension  Shoulder abduction 81 deg.  150 deg.  Shoulder adduction    Shoulder internal rotation 74 deg. 85 deg.  Shoulder external rotation 18 deg. 85 deg.  Elbow flexion [facility] WFL  Elbow extension    Wrist flexion    Wrist extension    Wrist ulnar deviation    Wrist radial deviation    Wrist pronation    Wrist supination limited   (Blank rows = not tested)  No AROM in R shoulder flexion/ abduction.  R forearm limited supination as compared to L.  UPPER EXTREMITY MMT:  MMT Right eval Left eval  Shoulder flexion Unable to test R UE   Shoulder extension    Shoulder abduction    Shoulder adduction    Shoulder internal rotation    Shoulder external rotation    Middle trapezius    Lower trapezius    Elbow flexion    Elbow extension    Wrist flexion    Wrist extension    Wrist ulnar deviation    Wrist radial deviation    Wrist pronation    Wrist supination    Grip strength (lbs) 10.6# 19.8%  (Blank rows = not tested)  SHOULDER SPECIAL TESTS: Not able to test at this time  JOINT MOBILITY TESTING:  Guarded movement.  Significant crepitus  PALPATION:  R/L mid-bicep circumference: 39 cm/ 28.5 cm.   10.5 cm difference.  Tenderness                                                                                                                             TREATMENT DATE: 11/06/2023  Subjective:  Pt. reports 6/10 R shoulder pain upon arrival today.  Pt. Had1st infusion last Friday and felt okay afterwards.    There.ex.:  Seated shoulder pulley (flexion/ scaption/ abduction) 20x.  Warm-up.    Supine wand ex. (Chest press/ shoulder flexion/ horizontal abduction/ adduction/ tricep extension)- 10x.    B UBE: 2 min. F/b  Supine R shoulder gentle isometrics (all planes)- cuing to avoid pain.  Difficulty with ER.  Good bicep/tricep isometrics.  Moderate cuing for proper technique/ R arm positioning.    Supine R shoulder A/AROM (all planes);  Supine R shoulder serratus punches 10x.  R shoulder horizontal abduction/  adduction.   Seated R shoulder AAROM against gravity with therapist assist x 5;  Manual tx.:  Supine R shoulder AA/PROM into flexion, scaption, ER and IR 10x each with static holds.    Supine R shoulder AP/PA/inf. Grade II-III mobs. 3x20 sec.    Supine STM to R UT region/ no Hypervolt today.   Not today: L sidelying R sh. Abduction/ horizontal adduction/ abduction A/AROM 20x each.  Pain tolerable range.      PATIENT EDUCATION: Education details: See HEP Person educated: Patient Education method: Explanation, Demonstration, and Handouts Education comprehension: verbalized understanding and returned demonstration  HOME EXERCISE PROGRAM: Access Code: [email or web address] URL: [email or web address] Date: 09/20/2023 Prepared by: Hazeline Lister  Exercises -  Seated Bicep Curls with Bar  - 2 x daily - 7 x weekly - 2 sets - 10 reps - Seated Forearm Pronation and Supination AROM  - 2 x daily - 7 x weekly - 2 sets - 10 reps - Seated Shoulder Flexion AAROM with Pulley Behind  - 2 x daily - 7 x weekly - 2 sets - 10 reps - Seated Shoulder Scaption AAROM with Pulley at Side  - 2 x daily - 7 x weekly - 2 sets - 10 reps - Seated Shoulder Abduction AAROM with Pulley Behind  - 2 x daily - 7 x weekly - 2 sets - 10 reps - Circular Shoulder Pendulum with Table Support  - 2 x daily - 7 x weekly - 2 sets - 10 reps   Access Code: [email or web address] URL: [email or web address] Date: 10/23/2023 Prepared by: Hazeline Lister Exercises - Scapular Retraction with Resistance  - 1 x daily - 7 x weekly - 2 sets - 10 reps - Shoulder extension with resistance - Neutral  - 1 x daily - 7 x weekly - 2 sets - 10 reps - Shoulder External Rotation and Scapular Retraction with Resistance  - 1 x daily - 7 x weekly - 2 sets - 10 reps  ASSESSMENT:  CLINICAL IMPRESSION: Moderate R shoulder/elbow crepitus with flexion/ abduction in all positions.  Pt. limited with R shoulder strength and PT slowly progressing  resisted/isometric tasks.  Pt. Encouraged to complete ex. Program on a daily/ consistent basis in a pain tolerable range.  No change in HEP at this time.  She will benefit from skilled PT services to increase R shoulder ROM/ strength to improve overhead reaching/ pain-free mobility.    OBJECTIVE IMPAIRMENTS: decreased activity tolerance, decreased mobility, decreased ROM, decreased strength, hypomobility, impaired flexibility, impaired UE functional use, improper body mechanics, postural dysfunction, and pain.   ACTIVITY LIMITATIONS: carrying, lifting, bathing, toileting, dressing, self feeding, reach over head, and hygiene/grooming  PARTICIPATION LIMITATIONS: cleaning, laundry, driving, shopping, community activity, and occupation  PERSONAL FACTORS: Fitness and Past/current experiences are also affecting patient's functional outcome.   REHAB POTENTIAL: Good  CLINICAL DECISION MAKING: Evolving/moderate complexity  EVALUATION COMPLEXITY: Moderate   GOALS: Goals reviewed with patient? Yes  SHORT TERM GOALS: Target date: 10/11/23  Pt. Independent with HEP to increase R shoulder PROM to [facility] as compared to L shoulder to progress to AROM/ ADL.   Baseline: see above Goal status: Not met  LONG TERM GOALS: Target date: 12/13/23  Pt. Will decrease QuickDASH to <50% to improve UE functional mobility.   Baseline:  90/9% Goal status: INITIAL  2.  Pt. Will increase R shoulder flexion AROM to >120 deg. To reaching overhead/ manage hair.   Baseline:  no AROM at time of evaluation Goal status: INITIAL  3.  Pt. Will present with 4/5 MMT in R UE to improve daily household/ work-related tasks.   Baseline:  No MMT at time of evaluation Goal status: INITIAL  4.  Pt. Will report no R shoulder pain/limitations with daily tasks to improve return to PLOF.   Baseline:  R shoulder pain/ limited.  Goal status: INITIAL   PLAN:  PT FREQUENCY: 2x/week  PT DURATION: 12 weeks  PLANNED INTERVENTIONS:  97110-Therapeutic exercises, 97530- Therapeutic activity, 97112- Neuromuscular re-education, 97535- Self Care, 54098- Manual therapy, G0283- Electrical stimulation (unattended), Patient/Family education, Joint mobilization, Cryotherapy, and Moist heat  PLAN FOR NEXT SESSION: Issue R shoulder isometrics for HEP/ Pt going to [place] from 6/10 to 6/25.   10th  visit progress note next tx.     Lendell Quarry, PT, DPT # [phone] 11/06/2023, 6:10 PM

## 2023-11-08 ENCOUNTER — Ambulatory Visit: Admitting: Physical Therapy

## 2023-11-08 ENCOUNTER — Encounter: Payer: Self-pay | Admitting: Physical Therapy

## 2023-11-08 DIAGNOSIS — M25611 Stiffness of right shoulder, not elsewhere classified: Secondary | ICD-10-CM

## 2023-11-08 DIAGNOSIS — M6281 Muscle weakness (generalized): Secondary | ICD-10-CM

## 2023-11-08 DIAGNOSIS — G8929 Other chronic pain: Secondary | ICD-10-CM

## 2023-11-08 DIAGNOSIS — R293 Abnormal posture: Secondary | ICD-10-CM

## 2023-11-08 NOTE — Therapy (Signed)
 OUTPATIENT PHYSICAL THERAPY SHOULDER TREATMENT Physical Therapy Progress Note  Dates of reporting period  09/20/23   to   11/08/23   Patient Name: Mariah Reeves MRN: [id number] DOB:1965/11/26, 58 y.o., female Today's Date: 11/08/2023  END OF SESSION:  PT End of Session - 11/08/23 1522     Visit Number 10    Number of Visits 24    Date for PT Re-Evaluation 12/13/23    PT Start Time 1523    PT Stop Time 1607    PT Time Calculation (min) 44 min    Activity Tolerance Patient limited by pain    Behavior During Therapy [facility] for tasks assessed/performed            Past Medical History:  Diagnosis Date   Allergy    Anomaly, uterus    tumor of uterus   Anxiety    Bilateral occipital neuralgia    Bipolar 1 disorder (HCC)    Chronic back pain    Complication of anesthesia    hard time waking up after gastric bypass.Pt stated that she has a small mouth and has had several surgeries since gastric bypass with no problems.   Degenerative disc disease, lumbar    Depression    Facet syndrome, lumbar    Family history of breast cancer    2/21 cancer genetic testing letter sent   GERD (gastroesophageal reflux disease)    before gastric bypass   Headache    Migraines   History of kidney stones    Left   Hx of vertigo    Patella fracture    Left   PTSD (post-traumatic stress disorder)    Sleep apnea    C-PAP   Wears glasses    Past Surgical History:  Procedure Laterality Date   ABDOMINOPLASTY  2006   CARPAL TUNNEL RELEASE Left    CHOLECYSTECTOMY     COLONOSCOPY W/ POLYPECTOMY     CYSTOSCOPY W/ URETERAL STENT PLACEMENT  01/03/2017   Procedure: CYSTOSCOPY WITH RETROGRADE PYELOGRAM/URETERAL STENT PLACEMENT;  Surgeon: Bart Born, MD;  Location: [facility];  Service: Urology;;   CYSTOSCOPY W/ URETERAL STENT PLACEMENT Left 01/17/2017   Procedure: CYSTOSCOPY WITH STENT REPLACEMENT;  Surgeon: Bart Born, MD;  Location: [facility];  Service: Urology;  Laterality: Left;    DILATATION & CURETTAGE/HYSTEROSCOPY WITH MYOSURE N/A 07/08/2015   Procedure: DILATATION & CURETTAGE/HYSTEROSCOPY WITH MYOSURE;  Surgeon: Alben Alma, MD;  Location: [facility];  Service: Gynecology;  Laterality: N/A;   DILATION AND CURETTAGE OF UTERUS     1/17   EXTRACORPOREAL SHOCK WAVE LITHOTRIPSY Left 12/07/2016   Procedure: EXTRACORPOREAL SHOCK WAVE LITHOTRIPSY (ESWL);  Surgeon: Bart Born, MD;  Location: [facility];  Service: Urology;  Laterality: Left;   GASTRIC BYPASS  2005   KNEE SURGERY Right    ACL- repair   LITHOTRIPSY     MIDDLE EAR SURGERY     NASAL SEPTUM SURGERY     OVARY SURGERY Right    ROTATOR CUFF REPAIR     URETEROSCOPY WITH HOLMIUM LASER LITHOTRIPSY     perforated ureter. 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   Encounter for BiPAP use counseling 08/08/2021   Mass  of upper inner quadrant of left breast 07/19/2021   Vaginal atrophy 07/19/2021   Family history of breast cancer 07/19/2021   OSA on CPAP 04/19/2020   CPAP use counseling 04/19/2020   Nephrolithiasis 01/26/2017   Left ureteral stone 01/03/2017   Endometrial disorder 07/08/2015   Bilateral occipital neuralgia 03/02/2015   DDD (degenerative disc disease), lumbar 12/24/2014   Facet syndrome, lumbar 12/24/2014   Sacroiliac joint dysfunction 12/24/2014    PCP: Claudine Cullens, MD  REFERRING PROVIDER: Sheilah Denver   REFERRING DIAG: Z61.096E (ICD-10-CM) - Unspecified fracture of upper end of right humerus, subsequent encounter for fracture with routine healing  THERAPY DIAG:  Shoulder joint stiffness, right  Chronic right shoulder pain  Muscle weakness (generalized)  Abnormal  posture  Rationale for Evaluation and Treatment: Rehabilitation  ONSET DATE: 07/30/23  Fall resulting in R proximal humerus fx  SUBJECTIVE:                                                                                                                                                                                      SUBJECTIVE STATEMENT: Pt. Fell on 2/17 while bring the trash bin in resulting in R proximal humerus fracture.  Pt. States the last x-ray revealed fracture is healed.  Pt. Reports 8/10 R shoulder pain at worst and demonstrates minimal movement.  Pt. Sleeping on back with R shoulder off the pillow.  Pt. 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  Pt. Is taking Oxy for pain and remains guarded with R shoulder movement.  Pt. States she has been doing pendulum ex.   Hand dominance: Right  PERTINENT HISTORY: Pt. Well known to PT clinic.  See MD notes.   PAIN:  Are you having pain? Yes: NPRS scale: 8/10 Pain location: R proximal shoulder Pain description: aching/ sharp Aggravating factors: movement Relieving factors: meds/ rest  PRECAUTIONS: Shoulder  RED FLAGS: None   WEIGHT BEARING RESTRICTIONS: No  FALLS:  Has patient fallen in last 6 months? Yes. Number of falls 1 with injury  LIVING ENVIRONMENT: Lives with: lives with their family and lives alone Has following equipment at home: None  OCCUPATION: Editor, commissioning at [employer]  PLOF: Independent  PATIENT GOALS: Increase R shoulder ROM/ strength/ return to [employer]  NEXT MD VISIT:   OBJECTIVE:  Note: Objective measures were completed at Evaluation unless otherwise noted.  DIAGNOSTIC FINDINGS:  See imaging  PATIENT SURVEYS:  Quick Dash 90.9% significant limitations  COGNITION: Overall cognitive status: Within functional limits for tasks assessed     SENSATION: WFL  POSTURE: Forward head/ rounded shoulder posture  UPPER EXTREMITY ROM:   Active ROM  Right PROM eval  Left AROM eval  Shoulder flexion 122 deg. 150 deg.  Shoulder extension    Shoulder abduction 81 deg. 150 deg.  Shoulder adduction    Shoulder internal rotation 74 deg. 85 deg.  Shoulder external rotation 18 deg. 85 deg.  Elbow flexion [facility] WFL  Elbow extension    Wrist flexion    Wrist extension    Wrist ulnar deviation    Wrist radial deviation    Wrist pronation    Wrist supination limited   (Blank rows = not tested)  No AROM in R shoulder flexion/ abduction.  R forearm limited supination as compared to L.  UPPER EXTREMITY MMT:  MMT Right eval Left eval  Shoulder flexion Unable to test R UE   Shoulder extension    Shoulder abduction    Shoulder adduction    Shoulder internal rotation    Shoulder external rotation    Middle trapezius    Lower trapezius    Elbow flexion    Elbow extension    Wrist flexion    Wrist extension    Wrist ulnar deviation    Wrist radial deviation    Wrist pronation    Wrist supination    Grip strength (lbs) 10.6# 19.8%  (Blank rows = not tested)  SHOULDER SPECIAL TESTS: Not able to test at this time  JOINT MOBILITY TESTING:  Guarded movement.  Significant crepitus  PALPATION:  R/L mid-bicep circumference: 39 cm/ 28.5 cm.   10.5 cm difference.  Tenderness                                                                                                                             TREATMENT DATE: 11/08/2023  Subjective:  Pt. States she has been able to reach R arm forward with less discomfort.  Pt. Compliant with HEP.    There.ex.:  Seated shoulder pulley (flexion/ scaption/ abduction) 20x.  Warm-up.    B UBE: 2 min. F/b  Supine wand ex. (Chest press/ shoulder flexion/ horizontal abduction/ adduction)- 20x.    Supine R shoulder gentle isometrics (all planes)- cuing to avoid pain.  Difficulty with ER.  Good bicep/tricep isometrics.  Moderate cuing for proper technique/ R arm positioning.    Supine R shoulder A/AROM (all planes);   Supine R shoulder serratus punches 10x.  R shoulder horizontal abduction/ adduction.   Seated R shoulder AAROM against gravity with therapist assist x 5;  Manual tx.:  Supine R shoulder AA/PROM into flexion, scaption, ER and IR 10x each with static holds.    Supine R shoulder AP/PA/inf. Grade II-III mobs. 3x20 sec.    Seated R UT/shoulder STM with use of Hypervolt today.    R shoulder AROM: supine (126 deg.), seated (108 deg.).     Not today: L sidelying R sh. Abduction/ horizontal adduction/ abduction A/AROM 20x each.  Pain tolerable range.      PATIENT EDUCATION: Education details: See HEP Person educated: Patient  Education method: Explanation, Demonstration, and Handouts Education comprehension: verbalized understanding and returned demonstration  HOME EXERCISE PROGRAM: Access Code: [email or web address] URL: [email or web address] Date: 09/20/2023 Prepared by: Hazeline Lister  Exercises - Seated Bicep Curls with Bar  - 2 x daily - 7 x weekly - 2 sets - 10 reps - Seated Forearm Pronation and Supination AROM  - 2 x daily - 7 x weekly - 2 sets - 10 reps - Seated Shoulder Flexion AAROM with Pulley Behind  - 2 x daily - 7 x weekly - 2 sets - 10 reps - Seated Shoulder Scaption AAROM with Pulley at Side  - 2 x daily - 7 x weekly - 2 sets - 10 reps - Seated Shoulder Abduction AAROM with Pulley Behind  - 2 x daily - 7 x weekly - 2 sets - 10 reps - Circular Shoulder Pendulum with Table Support  - 2 x daily - 7 x weekly - 2 sets - 10 reps   Access Code: [email or web address] URL: [email or web address] Date: 10/23/2023 Prepared by: Hazeline Lister Exercises - Scapular Retraction with Resistance  - 1 x daily - 7 x weekly - 2 sets - 10 reps - Shoulder extension with resistance - Neutral  - 1 x daily - 7 x weekly - 2 sets - 10 reps - Shoulder External Rotation and Scapular Retraction with Resistance  - 1 x daily - 7 x weekly - 2 sets - 10 reps  ASSESSMENT:  CLINICAL  IMPRESSION: Marked increase in R shoulder AROM in supine/ seated posture.  Pt. Remains limited by R shoulder/elbow crepitus and pain.   Tx. Focus today on R shoulder ROM/ joint mobility and manual isometrics.  Pt. Encouraged to complete ex. Program on a daily/ consistent basis in a pain tolerable range.  See updated goals.   She will benefit from skilled PT services to increase R shoulder ROM/ strength to improve overhead reaching/ pain-free mobility.    OBJECTIVE IMPAIRMENTS: decreased activity tolerance, decreased mobility, decreased ROM, decreased strength, hypomobility, impaired flexibility, impaired UE functional use, improper body mechanics, postural dysfunction, and pain.   ACTIVITY LIMITATIONS: carrying, lifting, bathing, toileting, dressing, self feeding, reach over head, and hygiene/grooming  PARTICIPATION LIMITATIONS: cleaning, laundry, driving, shopping, community activity, and occupation  PERSONAL FACTORS: Fitness and Past/current experiences are also affecting patient's functional outcome.   REHAB POTENTIAL: Good  CLINICAL DECISION MAKING: Evolving/moderate complexity  EVALUATION COMPLEXITY: Moderate   GOALS: Goals reviewed with patient? Yes  SHORT TERM GOALS: Target date: 10/11/23  Pt. Independent with HEP to increase R shoulder PROM to [facility] as compared to L shoulder to progress to AROM/ ADL.   Baseline: see above Goal status: Partially met  LONG TERM GOALS: Target date: 12/13/23  Pt. Will decrease QuickDASH to <50% to improve UE functional mobility.   Baseline:  90.9%.  5/29: 73% (improvement noted).   Goal status: Partially met  2.  Pt. Will increase R shoulder flexion AROM to >120 deg. To reaching overhead/ manage hair.   Baseline:  no AROM at time of evaluation.  5/29: Supine R shoulder AROM 128 deg./ seated 108 deg.  Goal status: Partially met  3.  Pt. Will present with 4/5 MMT in R UE to improve daily household/ work-related tasks.   Baseline:  No MMT at time of  evaluation Goal status: On-going  4.  Pt. Will report no R shoulder pain/limitations with daily tasks to improve return to PLOF.   Baseline:  R shoulder pain/ limited.  Goal status: Not met  PLAN:  PT FREQUENCY: 2x/week  PT DURATION: 12 weeks  PLANNED INTERVENTIONS: 97110-Therapeutic exercises, 97530- Therapeutic activity, [id number]- Neuromuscular re-education, 97535- Self Care, [id number]- Manual therapy, [id number]- Electrical stimulation (unattended), Patient/Family education, Joint mobilization, Cryotherapy, and Moist heat  PLAN FOR NEXT SESSION:  Pt. going to [place] from 6/10 to 6/25.      Lendell Quarry, PT, DPT # [phone] 11/08/2023, 4:12 PM

## 2023-11-15 ENCOUNTER — Ambulatory Visit: Attending: Orthopedic Surgery | Admitting: Physical Therapy

## 2023-11-15 ENCOUNTER — Encounter: Payer: Self-pay | Admitting: Physical Therapy

## 2023-11-15 DIAGNOSIS — M25511 Pain in right shoulder: Secondary | ICD-10-CM | POA: Insufficient documentation

## 2023-11-15 DIAGNOSIS — G8929 Other chronic pain: Secondary | ICD-10-CM | POA: Insufficient documentation

## 2023-11-15 DIAGNOSIS — M25611 Stiffness of right shoulder, not elsewhere classified: Secondary | ICD-10-CM | POA: Insufficient documentation

## 2023-11-15 DIAGNOSIS — R293 Abnormal posture: Secondary | ICD-10-CM | POA: Insufficient documentation

## 2023-11-15 DIAGNOSIS — M6281 Muscle weakness (generalized): Secondary | ICD-10-CM | POA: Diagnosis present

## 2023-11-15 NOTE — Therapy (Signed)
 OUTPATIENT PHYSICAL THERAPY SHOULDER TREATMENT  Patient Name: Mariah Reeves MRN: [id number] DOB:1966-03-19, 58 y.o., female Today's Date: 11/15/2023  END OF SESSION:  PT End of Session - 11/15/23 1319     Visit Number 11    Number of Visits 24    Date for PT Re-Evaluation 12/13/23    PT Start Time 1319    PT Stop Time 1421    PT Time Calculation (min) 62 min    Activity Tolerance Patient limited by pain    Behavior During Therapy WFL for tasks assessed/performed            Past Medical History:  Diagnosis Date   Allergy    Anomaly, uterus    tumor of uterus   Anxiety    Bilateral occipital neuralgia    Bipolar 1 disorder (HCC)    Chronic back pain    Complication of anesthesia    hard time waking up after gastric bypass.Pt stated that she has a small mouth and has had several surgeries since gastric bypass with no problems.   Degenerative disc disease, lumbar    Depression    Facet syndrome, lumbar    Family history of breast cancer    2/21 cancer genetic testing letter sent   GERD (gastroesophageal reflux disease)    before gastric bypass   Headache    Migraines   History of kidney stones    Left   Hx of vertigo    Patella fracture    Left   PTSD (post-traumatic stress disorder)    Sleep apnea    C-PAP   Wears glasses    Past Surgical History:  Procedure Laterality Date   ABDOMINOPLASTY  2006   CARPAL TUNNEL RELEASE Left    CHOLECYSTECTOMY     COLONOSCOPY W/ POLYPECTOMY     CYSTOSCOPY W/ URETERAL STENT PLACEMENT  01/03/2017   Procedure: CYSTOSCOPY WITH RETROGRADE PYELOGRAM/URETERAL STENT PLACEMENT;  Surgeon: Bart Born, MD;  Location: [facility];  Service: Urology;;   CYSTOSCOPY W/ URETERAL STENT PLACEMENT Left 01/17/2017   Procedure: CYSTOSCOPY WITH STENT REPLACEMENT;  Surgeon: Bart Born, MD;  Location: [facility];  Service: Urology;  Laterality: Left;   DILATATION & CURETTAGE/HYSTEROSCOPY WITH MYOSURE N/A 07/08/2015   Procedure: DILATATION &  CURETTAGE/HYSTEROSCOPY WITH MYOSURE;  Surgeon: Alben Alma, MD;  Location: [facility];  Service: Gynecology;  Laterality: N/A;   DILATION AND CURETTAGE OF UTERUS     1/17   EXTRACORPOREAL SHOCK WAVE LITHOTRIPSY Left 12/07/2016   Procedure: EXTRACORPOREAL SHOCK WAVE LITHOTRIPSY (ESWL);  Surgeon: Bart Born, MD;  Location: [facility];  Service: Urology;  Laterality: Left;   GASTRIC BYPASS  2005   KNEE SURGERY Right    ACL- repair   LITHOTRIPSY     MIDDLE EAR SURGERY     NASAL SEPTUM SURGERY     OVARY SURGERY Right    ROTATOR CUFF REPAIR     URETEROSCOPY WITH HOLMIUM LASER LITHOTRIPSY     perforated ureter. 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   Encounter for BiPAP use counseling 08/08/2021   Mass of upper inner quadrant of left breast 07/19/2021   Vaginal atrophy 07/19/2021   Family history of  breast cancer 07/19/2021   OSA on CPAP 04/19/2020   CPAP use counseling 04/19/2020   Nephrolithiasis 01/26/2017   Left ureteral stone 01/03/2017   Endometrial disorder 07/08/2015   Bilateral occipital neuralgia 03/02/2015   DDD (degenerative disc disease), lumbar 12/24/2014   Facet syndrome, lumbar 12/24/2014   Sacroiliac joint dysfunction 12/24/2014   PCP: Claudine Cullens, MD  REFERRING PROVIDER: Sheilah Denver   REFERRING DIAG: [id number] (ICD-10-CM) - Unspecified fracture of upper end of right humerus, subsequent encounter for fracture with routine healing  THERAPY DIAG:  Shoulder joint stiffness, right  Chronic right shoulder pain  Muscle weakness (generalized)  Abnormal posture  Rationale for Evaluation and Treatment: Rehabilitation  ONSET DATE: 07/30/23  Fall  resulting in R proximal humerus fx  SUBJECTIVE:                                                                                                                                                                                      SUBJECTIVE STATEMENT: Pt. Fell on 2/17 while bring the trash bin in resulting in R proximal humerus fracture.  Pt. States the last x-ray revealed fracture is healed.  Pt. Reports 8/10 R shoulder pain at worst and demonstrates minimal movement.  Pt. Sleeping on back with R shoulder off the pillow.  Pt. 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  Pt. Is taking Oxy for pain and remains guarded with R shoulder movement.  Pt. States she has been doing pendulum ex.   Hand dominance: Right  PERTINENT HISTORY: Pt. Well known to PT clinic.  See MD notes.   PAIN:  Are you having pain? Yes: NPRS scale: 8/10 Pain location: R proximal shoulder Pain description: aching/ sharp Aggravating factors: movement Relieving factors: meds/ rest  PRECAUTIONS: Shoulder  RED FLAGS: None   WEIGHT BEARING RESTRICTIONS: No  FALLS:  Has patient fallen in last 6 months? Yes. Number of falls 1 with injury  LIVING ENVIRONMENT: Lives with: lives with their family and lives alone Has following equipment at home: None  OCCUPATION: Editor, commissioning at [employer]  PLOF: Independent  PATIENT GOALS: Increase R shoulder ROM/ strength/ return to [employer]  NEXT MD VISIT:   OBJECTIVE:  Note: Objective measures were completed at Evaluation unless otherwise noted.  DIAGNOSTIC FINDINGS:  See imaging  PATIENT SURVEYS:  Quick Dash 90.9% significant limitations  COGNITION: Overall cognitive status: Within functional limits for tasks assessed     SENSATION: WFL  POSTURE: Forward head/ rounded shoulder posture  UPPER EXTREMITY ROM:   Active ROM Right PROM eval Left AROM eval  Shoulder flexion 122 deg. 150 deg.  Shoulder extension  Shoulder abduction 81 deg.  150 deg.  Shoulder adduction    Shoulder internal rotation 74 deg. 85 deg.  Shoulder external rotation 18 deg. 85 deg.  Elbow flexion [facility] WFL  Elbow extension    Wrist flexion    Wrist extension    Wrist ulnar deviation    Wrist radial deviation    Wrist pronation    Wrist supination limited   (Blank rows = not tested)  No AROM in R shoulder flexion/ abduction.  R forearm limited supination as compared to L.  UPPER EXTREMITY MMT:  MMT Right eval Left eval  Shoulder flexion Unable to test R UE   Shoulder extension    Shoulder abduction    Shoulder adduction    Shoulder internal rotation    Shoulder external rotation    Middle trapezius    Lower trapezius    Elbow flexion    Elbow extension    Wrist flexion    Wrist extension    Wrist ulnar deviation    Wrist radial deviation    Wrist pronation    Wrist supination    Grip strength (lbs) 10.6# 19.8%  (Blank rows = not tested)  SHOULDER SPECIAL TESTS: Not able to test at this time  JOINT MOBILITY TESTING:  Guarded movement.  Significant crepitus  PALPATION:  R/L mid-bicep circumference: 39 cm/ 28.5 cm.   10.5 cm difference.  Tenderness  R shoulder AROM: supine (126 deg.), seated (108 deg.).                                                                                                                            TREATMENT DATE: 11/15/2023  Subjective:  Pt. States she has been able to reach R arm forward with less discomfort.  Pt. Still sleeping with R elbow on rolled up towel at night and will wake up with hands behind head.  Pt. Headed to [place] for a couple weeks and will return to PT the first week of July.    There.ex.:  Seated shoulder pulley (flexion/ scaption/ abduction) 20x.  Warm-up.    B UBE: 2 min. F/b.  Discussed HEP  Standing wand ex. At mirror (Chest press/ shoulder flexion/ horizontal abduction/ adduction/ sh. Extension/ IR)- 20x.    Standing R shoulder flexion with use of wash cloth: CW/CCW.     Seated R shoulder flexion with isometric holds 10x (PT assist for eccentric control).  Seated ER/ IR isometrics.  Moderate cuing for proper technique/ R arm positioning.    Supine R shoulder A/AROM (all planes);  Supine R shoulder serratus punches 10x.  R shoulder horizontal abduction/ adduction.   Seated R shoulder AAROM against gravity with therapist assist x 5;  Manual tx.:  Supine R shoulder AA/PROM into flexion, scaption, ER and IR 10x each with static holds.    Seated R UT/shoulder STM with use of Hypervolt today.     Not today: L sidelying R sh. Abduction/ horizontal adduction/ abduction  A/AROM 20x each.  Pain tolerable range.      PATIENT EDUCATION: Education details: See HEP Person educated: Patient Education method: Explanation, Demonstration, and Handouts Education comprehension: verbalized understanding and returned demonstration  HOME EXERCISE PROGRAM: Access Code: [email or web address] URL: [email or web address] Date: 09/20/2023 Prepared by: Hazeline Lister  Exercises - Seated Bicep Curls with Bar  - 2 x daily - 7 x weekly - 2 sets - 10 reps - Seated Forearm Pronation and Supination AROM  - 2 x daily - 7 x weekly - 2 sets - 10 reps - Seated Shoulder Flexion AAROM with Pulley Behind  - 2 x daily - 7 x weekly - 2 sets - 10 reps - Seated Shoulder Scaption AAROM with Pulley at Side  - 2 x daily - 7 x weekly - 2 sets - 10 reps - Seated Shoulder Abduction AAROM with Pulley Behind  - 2 x daily - 7 x weekly - 2 sets - 10 reps - Circular Shoulder Pendulum with Table Support  - 2 x daily - 7 x weekly - 2 sets - 10 reps   Access Code: [email or web address] URL: [email or web address] Date: 10/23/2023 Prepared by: Hazeline Lister Exercises - Scapular Retraction with Resistance  - 1 x daily - 7 x weekly - 2 sets - 10 reps - Shoulder extension with resistance - Neutral  - 1 x daily - 7 x weekly - 2 sets - 10 reps - Shoulder External Rotation and Scapular Retraction with  Resistance  - 1 x daily - 7 x weekly - 2 sets - 10 reps  ASSESSMENT:  CLINICAL IMPRESSION: Pt. Remains limited by R shoulder/elbow crepitus and pain with ROM progress noted this week.   Tx. Focus today on R shoulder ROM/ joint mobility and manual isometrics.  Pt. Encouraged to complete ex. Program on a daily basis while in [place] with family.  No changes to HEP.   She will benefit from skilled PT services to increase R shoulder ROM/ strength to improve overhead reaching/ pain-free mobility.    OBJECTIVE IMPAIRMENTS: decreased activity tolerance, decreased mobility, decreased ROM, decreased strength, hypomobility, impaired flexibility, impaired UE functional use, improper body mechanics, postural dysfunction, and pain.   ACTIVITY LIMITATIONS: carrying, lifting, bathing, toileting, dressing, self feeding, reach over head, and hygiene/grooming  PARTICIPATION LIMITATIONS: cleaning, laundry, driving, shopping, community activity, and occupation  PERSONAL FACTORS: Fitness and Past/current experiences are also affecting patient's functional outcome.   REHAB POTENTIAL: Good  CLINICAL DECISION MAKING: Evolving/moderate complexity  EVALUATION COMPLEXITY: Moderate   GOALS: Goals reviewed with patient? Yes  SHORT TERM GOALS: Target date: 10/11/23  Pt. Independent with HEP to increase R shoulder PROM to [facility] as compared to L shoulder to progress to AROM/ ADL.   Baseline: see above Goal status: Partially met  LONG TERM GOALS: Target date: 12/13/23  Pt. Will decrease QuickDASH to <50% to improve UE functional mobility.   Baseline:  90.9%.  5/29: 73% (improvement noted).   Goal status: Partially met  2.  Pt. Will increase R shoulder flexion AROM to >120 deg. To reaching overhead/ manage hair.   Baseline:  no AROM at time of evaluation.  5/29: Supine R shoulder AROM 128 deg./ seated 108 deg.  Goal status: Partially met  3.  Pt. Will present with 4/5 MMT in R UE to improve daily household/  work-related tasks.   Baseline:  No MMT at time of evaluation Goal status: On-going  4.  Pt. Will report no R shoulder pain/limitations with daily tasks to improve return to  PLOF.   Baseline:  R shoulder pain/ limited.  Goal status: Not met  PLAN:  PT FREQUENCY: 2x/week  PT DURATION: 12 weeks  PLANNED INTERVENTIONS: 97110-Therapeutic exercises, 97530- Therapeutic activity, [id number]- Neuromuscular re-education, 97535- Self Care, 96045- Manual therapy, [id number]- Electrical stimulation (unattended), Patient/Family education, Joint mobilization, Cryotherapy, and Moist heat  PLAN FOR NEXT SESSION:  CHECK GOALS.  Discuss trip to [place]    Stana Bayon C Yareliz Thorstenson, PT, DPT # [phone] 11/15/2023, 2:23 PM

## 2023-11-19 ENCOUNTER — Encounter: Admitting: Physical Therapy

## 2023-11-21 ENCOUNTER — Encounter: Admitting: Physical Therapy

## 2023-11-26 ENCOUNTER — Encounter: Admitting: Physical Therapy

## 2023-11-28 ENCOUNTER — Encounter: Admitting: Physical Therapy

## 2023-12-07 ENCOUNTER — Encounter

## 2023-12-11 ENCOUNTER — Ambulatory Visit: Attending: Orthopedic Surgery | Admitting: Physical Therapy

## 2023-12-11 ENCOUNTER — Encounter: Payer: Self-pay | Admitting: Physical Therapy

## 2023-12-11 DIAGNOSIS — G8929 Other chronic pain: Secondary | ICD-10-CM | POA: Diagnosis present

## 2023-12-11 DIAGNOSIS — M25611 Stiffness of right shoulder, not elsewhere classified: Secondary | ICD-10-CM | POA: Insufficient documentation

## 2023-12-11 DIAGNOSIS — R293 Abnormal posture: Secondary | ICD-10-CM | POA: Diagnosis present

## 2023-12-11 DIAGNOSIS — M25511 Pain in right shoulder: Secondary | ICD-10-CM | POA: Insufficient documentation

## 2023-12-11 DIAGNOSIS — M6281 Muscle weakness (generalized): Secondary | ICD-10-CM | POA: Diagnosis present

## 2023-12-11 NOTE — Therapy (Signed)
 OUTPATIENT PHYSICAL THERAPY SHOULDER TREATMENT/ RECERTIFICATION  Patient Name: Mariah Reeves MRN: [id number] DOB:[birth year], 58 y.o., female Today's Date: 12/11/2023  END OF SESSION:  PT End of Session - 12/11/23 1526     Visit Number 12    Number of Visits 24    Date for PT Re-Evaluation 01/22/24    PT Start Time 1526    PT Stop Time 1618    PT Time Calculation (min) 52 min    Activity Tolerance Patient limited by pain    Behavior During Therapy WFL for tasks assessed/performed         Past Medical History:  Diagnosis Date   Allergy    Anomaly, uterus    tumor of uterus   Anxiety    Bilateral occipital neuralgia    Bipolar 1 disorder (HCC)    Chronic back pain    Complication of anesthesia    hard time waking up after gastric bypass.Pt stated that she has a small mouth and has had several surgeries since gastric bypass with no problems.   Degenerative disc disease, lumbar    Depression    Facet syndrome, lumbar    Family history of breast cancer    2/21 cancer genetic testing letter sent   GERD (gastroesophageal reflux disease)    before gastric bypass   Headache    Migraines   History of kidney stones    Left   Hx of vertigo    Patella fracture    Left   PTSD (post-traumatic stress disorder)    Sleep apnea    C-PAP   Wears glasses    Past Surgical History:  Procedure Laterality Date   ABDOMINOPLASTY  2006   CARPAL TUNNEL RELEASE Left    CHOLECYSTECTOMY     COLONOSCOPY W/ POLYPECTOMY     CYSTOSCOPY W/ URETERAL STENT PLACEMENT  01/03/2017   Procedure: CYSTOSCOPY WITH RETROGRADE PYELOGRAM/URETERAL STENT PLACEMENT;  Surgeon: Chauncey Redell Agent, MD;  Location: [facility];  Service: Urology;;   CYSTOSCOPY W/ URETERAL STENT PLACEMENT Left 01/17/2017   Procedure: CYSTOSCOPY WITH STENT REPLACEMENT;  Surgeon: Chauncey Redell Agent, MD;  Location: [facility];  Service: Urology;  Laterality: Left;   DILATATION & CURETTAGE/HYSTEROSCOPY WITH MYOSURE N/A 07/08/2015   Procedure:  DILATATION & CURETTAGE/HYSTEROSCOPY WITH MYOSURE;  Surgeon: Lamar SHAUNNA Lesches, MD;  Location: [facility];  Service: Gynecology;  Laterality: N/A;   DILATION AND CURETTAGE OF UTERUS     1/17   EXTRACORPOREAL SHOCK WAVE LITHOTRIPSY Left 12/07/2016   Procedure: EXTRACORPOREAL SHOCK WAVE LITHOTRIPSY (ESWL);  Surgeon: Chauncey Redell Agent, MD;  Location: [facility];  Service: Urology;  Laterality: Left;   GASTRIC BYPASS  2005   KNEE SURGERY Right    ACL- repair   LITHOTRIPSY     MIDDLE EAR SURGERY     NASAL SEPTUM SURGERY     OVARY SURGERY Right    ROTATOR CUFF REPAIR     URETEROSCOPY WITH HOLMIUM LASER LITHOTRIPSY     perforated ureter. had nephrostomy tube for brief time   URETEROSCOPY WITH HOLMIUM LASER LITHOTRIPSY Left 01/17/2017   Procedure: URETEROSCOPY WITH HOLMIUM LASER LITHOTRIPSY;  Surgeon: Chauncey Redell Agent, MD;  Location: [facility];  Service: Urology;  Laterality: Left;   Patient Active Problem List   Diagnosis Date Noted   Complex sleep apnea syndrome 08/08/2021   Encounter for BiPAP use counseling 08/08/2021   Mass of upper inner quadrant of left breast 07/19/2021   Vaginal atrophy 07/19/2021   Family history of breast cancer  07/19/2021   OSA on CPAP 04/19/2020   CPAP use counseling 04/19/2020   Nephrolithiasis 01/26/2017   Left ureteral stone 01/03/2017   Endometrial disorder 07/08/2015   Bilateral occipital neuralgia 03/02/2015   DDD (degenerative disc disease), lumbar 12/24/2014   Facet syndrome, lumbar 12/24/2014   Sacroiliac joint dysfunction 12/24/2014   PCP: Derick Leita POUR, MD  REFERRING PROVIDER: Danella Donnice RIGGERS   REFERRING DIAG: D57.798I (ICD-10-CM) - Unspecified fracture of upper end of right humerus, subsequent encounter for fracture with routine healing  THERAPY DIAG:  Shoulder joint stiffness, right  Chronic right shoulder pain  Muscle weakness (generalized)  Abnormal posture  Rationale for Evaluation and Treatment: Rehabilitation  ONSET DATE:  07/30/23  Fall resulting in R proximal humerus fx  SUBJECTIVE:                                                                                                                                                                                      SUBJECTIVE STATEMENT: Pt. Fell on 2/17 while bring the trash bin in resulting in R proximal humerus fracture.  Pt. States the last x-ray revealed fracture is healed.  Pt. Reports 8/10 R shoulder pain at worst and demonstrates minimal movement.  Pt. Sleeping on back with R shoulder off the pillow.  Pt. 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  Pt. Is taking Oxy for pain and remains guarded with R shoulder movement.  Pt. States she has been doing pendulum ex.   Hand dominance: Right  PERTINENT HISTORY: Pt. Well known to PT clinic.  See MD notes.   PAIN:  Are you having pain? Yes: NPRS scale: 8/10 Pain location: R proximal shoulder Pain description: aching/ sharp Aggravating factors: movement Relieving factors: meds/ rest  PRECAUTIONS: Shoulder  RED FLAGS: None   WEIGHT BEARING RESTRICTIONS: No  FALLS:  Has patient fallen in last 6 months? Yes. Number of falls 1 with injury  LIVING ENVIRONMENT: Lives with: lives with their family and lives alone Has following equipment at home: None  OCCUPATION: Editor, commissioning at [employer]  PLOF: Independent  PATIENT GOALS: Increase R shoulder ROM/ strength/ return to [employer]  NEXT MD VISIT:   OBJECTIVE:  Note: Objective measures were completed at Evaluation unless otherwise noted.  DIAGNOSTIC FINDINGS:  See imaging  PATIENT SURVEYS:  Quick Dash 90.9% significant limitations  COGNITION: Overall cognitive status: Within functional limits for tasks assessed     SENSATION: WFL  POSTURE: Forward head/ rounded shoulder posture  UPPER EXTREMITY ROM:   Active ROM Right PROM eval Left AROM eval  Shoulder flexion 122 deg. 150 deg.  Shoulder extension    Shoulder  abduction 81 deg. 150 deg.  Shoulder adduction    Shoulder internal rotation 74 deg. 85 deg.  Shoulder external rotation 18 deg. 85 deg.  Elbow flexion [facility] WFL  Elbow extension    Wrist flexion    Wrist extension    Wrist ulnar deviation    Wrist radial deviation    Wrist pronation    Wrist supination limited   (Blank rows = not tested)  No AROM in R shoulder flexion/ abduction.  R forearm limited supination as compared to L.  UPPER EXTREMITY MMT:  MMT Right eval Left eval  Shoulder flexion Unable to test R UE   Shoulder extension    Shoulder abduction    Shoulder adduction    Shoulder internal rotation    Shoulder external rotation    Middle trapezius    Lower trapezius    Elbow flexion    Elbow extension    Wrist flexion    Wrist extension    Wrist ulnar deviation    Wrist radial deviation    Wrist pronation    Wrist supination    Grip strength (lbs) 10.6# 19.8%  (Blank rows = not tested)  SHOULDER SPECIAL TESTS: Not able to test at this time  JOINT MOBILITY TESTING:  Guarded movement.  Significant crepitus  PALPATION:  R/L mid-bicep circumference: 39 cm/ 28.5 cm.   10.5 cm difference.  Tenderness  R shoulder AROM: supine (126 deg.), seated (108 deg.).                                                                                                                            TREATMENT DATE: 12/11/2023  Subjective:  Pt. States she has been able to reach R arm forward with less discomfort.  Pt. Still sleeping with R elbow on rolled up towel at night and will wake up with hands behind head.  Pt. Headed to [place] for a couple weeks and will return to PT the first week of July.    UPPER EXTREMITY ROM:   Active ROM Right AROM eval Left AROM eval  Shoulder flexion 109 deg. 142 deg.  Shoulder extension    Shoulder abduction 72 deg. 138 deg.  Shoulder adduction    Shoulder internal rotation 44 deg. 88 deg.  Shoulder external rotation 18 deg. 82 deg.  Elbow  flexion [facility] WFL  Elbow extension    Wrist flexion    Wrist extension    Wrist ulnar deviation    Wrist radial deviation    Wrist pronation    Wrist supination WFL WFL  (Blank rows = not tested)  No AROM in R shoulder flexion/ abduction.  R forearm limited supination as compared to L.  UPPER EXTREMITY MMT:  MMT Right  Left   Shoulder flexion 3 4  Shoulder extension    Shoulder abduction 3 4-  Shoulder adduction    Shoulder internal rotation 3- 4+  Shoulder external rotation 3 4  Middle trapezius  Lower trapezius    Elbow flexion 4- 4  Elbow extension 3 4+  Wrist flexion 4+ 4+  Wrist extension 4+ 4+  Wrist ulnar deviation    Wrist radial deviation    Wrist pronation    Wrist supination    Grip strength (lbs) 28.3# 23.1#  (Blank rows = not tested) Lumbricals: R:5, L:5   There.ex.:  Goal reassessment  Supine R shoulder AROM (all planes)  Supine R shoulder A/AROM (all planes);  Supine R shoulder serratus punches 10x.  R shoulder horizontal abduction/ adduction.   Seated R shoulder AAROM against gravity with therapist assist x 5;  Manual tx.:  Supine R shoulder AA/PROM into flexion, scaption, ER and IR 10x each with static holds.      PATIENT EDUCATION: Education details: See HEP Person educated: Patient Education method: Explanation, Demonstration, and Handouts Education comprehension: verbalized understanding and returned demonstration  HOME EXERCISE PROGRAM: Access Code: [email or web address] URL: [email or web address] Date: 09/20/2023 Prepared by: Ozell Sero  Exercises - Seated Bicep Curls with Bar  - 2 x daily - 7 x weekly - 2 sets - 10 reps - Seated Forearm Pronation and Supination AROM  - 2 x daily - 7 x weekly - 2 sets - 10 reps - Seated Shoulder Flexion AAROM with Pulley Behind  - 2 x daily - 7 x weekly - 2 sets - 10 reps - Seated Shoulder Scaption AAROM with Pulley at Side  - 2 x daily - 7 x weekly - 2 sets - 10 reps - Seated Shoulder  Abduction AAROM with Pulley Behind  - 2 x daily - 7 x weekly - 2 sets - 10 reps - Circular Shoulder Pendulum with Table Support  - 2 x daily - 7 x weekly - 2 sets - 10 reps   Access Code: [email or web address] URL: [email or web address] Date: 10/23/2023 Prepared by: Ozell Sero Exercises - Scapular Retraction with Resistance  - 1 x daily - 7 x weekly - 2 sets - 10 reps - Shoulder extension with resistance - Neutral  - 1 x daily - 7 x weekly - 2 sets - 10 reps - Shoulder External Rotation and Scapular Retraction with Resistance  - 1 x daily - 7 x weekly - 2 sets - 10 reps  ASSESSMENT:  CLINICAL IMPRESSION: Pt. Remains limited by R shoulder/elbow crepitus and pain with ROM progress noted this week.   Tx. Focus today on R shoulder ROM/ joint mobility and manual isometrics.  Pt. Encouraged to complete ex. Program on a daily basis while in [place] with family.  No changes to HEP.   She will benefit from skilled PT services to increase R shoulder ROM/ strength to improve overhead reaching/ pain-free mobility.    OBJECTIVE IMPAIRMENTS: decreased activity tolerance, decreased mobility, decreased ROM, decreased strength, hypomobility, impaired flexibility, impaired UE functional use, improper body mechanics, postural dysfunction, and pain.   ACTIVITY LIMITATIONS: carrying, lifting, bathing, toileting, dressing, self feeding, reach over head, and hygiene/grooming  PARTICIPATION LIMITATIONS: cleaning, laundry, driving, shopping, community activity, and occupation  PERSONAL FACTORS: Fitness and Past/current experiences are also affecting patient's functional outcome.   REHAB POTENTIAL: Good  CLINICAL DECISION MAKING: Evolving/moderate complexity  EVALUATION COMPLEXITY: Moderate   GOALS: Goals reviewed with patient? Yes  SHORT TERM GOALS: Target date: 10/11/23  Pt. Independent with HEP to increase R shoulder PROM to [facility] as compared to L shoulder to progress to AROM/ ADL.   Baseline: see  above Goal status: Partially met  LONG TERM GOALS: Target date: 12/13/23  Pt. Will decrease QuickDASH to <50% to improve UE functional mobility.   Baseline:  90.9%.  5/29: 73% (improvement noted).   Goal status: Partially met  2.  Pt. Will increase R shoulder flexion AROM to >120 deg. To reaching overhead/ manage hair.   Baseline:  no AROM at time of evaluation.  5/29: Supine R shoulder AROM 128 deg./ seated 108 deg.  Goal status: Partially met  3.  Pt. Will present with 4/5 MMT in R UE to improve daily household/ work-related tasks.   Baseline:  No MMT at time of evaluation Goal status: On-going  4.  Pt. Will report no R shoulder pain/limitations with daily tasks to improve return to PLOF.   Baseline:  R shoulder pain/ limited.  Goal status: Not met  PLAN:  PT FREQUENCY: 2x/week  PT DURATION: 6 weeks  PLANNED INTERVENTIONS: 97110-Therapeutic exercises, 97530- Therapeutic activity, [id number]- Neuromuscular re-education, 97535- Self Care, [id number]- Manual therapy, [id number]- Electrical stimulation (unattended), Patient/Family education, Joint mobilization, Cryotherapy, and Moist heat  PLAN FOR NEXT SESSION:  Discuss MD f/u   Ozell JAYSON Sero, PT, DPT # [phone] 12/11/2023, 6:09 PM

## 2023-12-12 ENCOUNTER — Encounter: Payer: Self-pay | Admitting: Physical Therapy

## 2023-12-13 ENCOUNTER — Ambulatory Visit: Admitting: Physical Therapy

## 2023-12-13 ENCOUNTER — Other Ambulatory Visit: Payer: Self-pay | Admitting: Orthopedic Surgery

## 2023-12-13 DIAGNOSIS — M25611 Stiffness of right shoulder, not elsewhere classified: Secondary | ICD-10-CM | POA: Diagnosis not present

## 2023-12-13 DIAGNOSIS — G8929 Other chronic pain: Secondary | ICD-10-CM

## 2023-12-13 DIAGNOSIS — R293 Abnormal posture: Secondary | ICD-10-CM

## 2023-12-13 DIAGNOSIS — M6281 Muscle weakness (generalized): Secondary | ICD-10-CM

## 2023-12-13 DIAGNOSIS — S42201D Unspecified fracture of upper end of right humerus, subsequent encounter for fracture with routine healing: Secondary | ICD-10-CM

## 2023-12-13 NOTE — Therapy (Signed)
 OUTPATIENT PHYSICAL THERAPY SHOULDER TREATMENT  Patient Name: Mariah Reeves MRN: [id number] DOB:1965-12-25, 58 y.o., female Today's Date: 12/13/2023  END OF SESSION:  PT End of Session - 12/13/23 1515     Visit Number 13    Number of Visits 24    Date for PT Re-Evaluation 01/22/24    PT Start Time 1515    PT Stop Time 1607    PT Time Calculation (min) 52 min    Activity Tolerance --    Behavior During Therapy WFL for tasks assessed/performed         Past Medical History:  Diagnosis Date   Allergy    Anomaly, uterus    tumor of uterus   Anxiety    Bilateral occipital neuralgia    Bipolar 1 disorder (HCC)    Chronic back pain    Complication of anesthesia    hard time waking up after gastric bypass.Pt stated that she has a small mouth and has had several surgeries since gastric bypass with no problems.   Degenerative disc disease, lumbar    Depression    Facet syndrome, lumbar    Family history of breast cancer    2/21 cancer genetic testing letter sent   GERD (gastroesophageal reflux disease)    before gastric bypass   Headache    Migraines   History of kidney stones    Left   Hx of vertigo    Patella fracture    Left   PTSD (post-traumatic stress disorder)    Sleep apnea    C-PAP   Wears glasses    Past Surgical History:  Procedure Laterality Date   ABDOMINOPLASTY  2006   CARPAL TUNNEL RELEASE Left    CHOLECYSTECTOMY     COLONOSCOPY W/ POLYPECTOMY     CYSTOSCOPY W/ URETERAL STENT PLACEMENT  01/03/2017   Procedure: CYSTOSCOPY WITH RETROGRADE PYELOGRAM/URETERAL STENT PLACEMENT;  Surgeon: Chauncey Redell Agent, MD;  Location: [facility];  Service: Urology;;   CYSTOSCOPY W/ URETERAL STENT PLACEMENT Left 01/17/2017   Procedure: CYSTOSCOPY WITH STENT REPLACEMENT;  Surgeon: Chauncey Redell Agent, MD;  Location: [facility];  Service: Urology;  Laterality: Left;   DILATATION & CURETTAGE/HYSTEROSCOPY WITH MYOSURE N/A 07/08/2015   Procedure: DILATATION & CURETTAGE/HYSTEROSCOPY  WITH MYOSURE;  Surgeon: Lamar SHAUNNA Lesches, MD;  Location: [facility];  Service: Gynecology;  Laterality: N/A;   DILATION AND CURETTAGE OF UTERUS     1/17   EXTRACORPOREAL SHOCK WAVE LITHOTRIPSY Left 12/07/2016   Procedure: EXTRACORPOREAL SHOCK WAVE LITHOTRIPSY (ESWL);  Surgeon: Chauncey Redell Agent, MD;  Location: [facility];  Service: Urology;  Laterality: Left;   GASTRIC BYPASS  2005   KNEE SURGERY Right    ACL- repair   LITHOTRIPSY     MIDDLE EAR SURGERY     NASAL SEPTUM SURGERY     OVARY SURGERY Right    ROTATOR CUFF REPAIR     URETEROSCOPY WITH HOLMIUM LASER LITHOTRIPSY     perforated ureter. had nephrostomy tube for brief time   URETEROSCOPY WITH HOLMIUM LASER LITHOTRIPSY Left 01/17/2017   Procedure: URETEROSCOPY WITH HOLMIUM LASER LITHOTRIPSY;  Surgeon: Chauncey Redell Agent, MD;  Location: [facility];  Service: Urology;  Laterality: Left;   Patient Active Problem List   Diagnosis Date Noted   Complex sleep apnea syndrome 08/08/2021   Encounter for BiPAP use counseling 08/08/2021   Mass of upper inner quadrant of left breast 07/19/2021   Vaginal atrophy 07/19/2021   Family history of breast cancer 07/19/2021   OSA  on CPAP 04/19/2020   CPAP use counseling 04/19/2020   Nephrolithiasis 01/26/2017   Left ureteral stone 01/03/2017   Endometrial disorder 07/08/2015   Bilateral occipital neuralgia 03/02/2015   DDD (degenerative disc disease), lumbar 12/24/2014   Facet syndrome, lumbar 12/24/2014   Sacroiliac joint dysfunction 12/24/2014   PCP: Derick Leita POUR, MD  REFERRING PROVIDER: Danella Donnice RIGGERS   REFERRING DIAG: D57.798I (ICD-10-CM) - Unspecified fracture of upper end of right humerus, subsequent encounter for fracture with routine healing  THERAPY DIAG:  Shoulder joint stiffness, right  Chronic right shoulder pain  Muscle weakness (generalized)  Abnormal posture  Rationale for Evaluation and Treatment: Rehabilitation  ONSET DATE: 07/30/23  Fall resulting in R proximal  humerus fx  SUBJECTIVE:                                                                                                                                                                                      SUBJECTIVE STATEMENT: Pt. Fell on 2/17 while bring the trash bin in resulting in R proximal humerus fracture.  Pt. States the last x-ray revealed fracture is healed.  Pt. Reports 8/10 R shoulder pain at worst and demonstrates minimal movement.  Pt. Sleeping on back with R shoulder off the pillow.  Pt. 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  Pt. Is taking Oxy for pain and remains guarded with R shoulder movement.  Pt. States she has been doing pendulum ex.   Hand dominance: Right  PERTINENT HISTORY: Pt. Well known to PT clinic.  See MD notes.   PAIN:  Are you having pain? Yes: NPRS scale: 8/10 Pain location: R proximal shoulder Pain description: aching/ sharp Aggravating factors: movement Relieving factors: meds/ rest  PRECAUTIONS: Shoulder  RED FLAGS: None   WEIGHT BEARING RESTRICTIONS: No  FALLS:  Has patient fallen in last 6 months? Yes. Number of falls 1 with injury  LIVING ENVIRONMENT: Lives with: lives with their family and lives alone Has following equipment at home: None  OCCUPATION: Editor, commissioning at [employer]  PLOF: Independent  PATIENT GOALS: Increase R shoulder ROM/ strength/ return to [employer]  NEXT MD VISIT:   OBJECTIVE:  Note: Objective measures were completed at Evaluation unless otherwise noted.  DIAGNOSTIC FINDINGS:  See imaging  PATIENT SURVEYS:  Quick Dash 90.9% significant limitations  COGNITION: Overall cognitive status: Within functional limits for tasks assessed     SENSATION: WFL  POSTURE: Forward head/ rounded shoulder posture  UPPER EXTREMITY ROM:   Active ROM Right PROM eval Left AROM eval  Shoulder flexion 122 deg. 150 deg.  Shoulder extension    Shoulder abduction 81 deg. 150 deg.  Shoulder  adduction    Shoulder internal rotation 74 deg. 85 deg.  Shoulder external rotation 18 deg. 85 deg.  Elbow flexion [facility] WFL  Elbow extension    Wrist flexion    Wrist extension    Wrist ulnar deviation    Wrist radial deviation    Wrist pronation    Wrist supination limited   (Blank rows = not tested)  No AROM in R shoulder flexion/ abduction.  R forearm limited supination as compared to L.  UPPER EXTREMITY MMT:  MMT Right eval Left eval  Shoulder flexion Unable to test R UE   Shoulder extension    Shoulder abduction    Shoulder adduction    Shoulder internal rotation    Shoulder external rotation    Middle trapezius    Lower trapezius    Elbow flexion    Elbow extension    Wrist flexion    Wrist extension    Wrist ulnar deviation    Wrist radial deviation    Wrist pronation    Wrist supination    Grip strength (lbs) 10.6# 19.8%  (Blank rows = not tested)  SHOULDER SPECIAL TESTS: Not able to test at this time  JOINT MOBILITY TESTING:  Guarded movement.  Significant crepitus  PALPATION:  R/L mid-bicep circumference: 39 cm/ 28.5 cm.   10.5 cm difference.  Tenderness  R shoulder AROM: supine (126 deg.), seated (108 deg.).   UPPER EXTREMITY ROM:   Active ROM Right AROM eval Left AROM eval  Shoulder flexion 109 deg. 142 deg.  Shoulder extension    Shoulder abduction 72 deg. 138 deg.  Shoulder adduction    Shoulder internal rotation 44 deg. 88 deg.  Shoulder external rotation 18 deg. 82 deg.  Elbow flexion [facility] WFL  Elbow extension    Wrist flexion    Wrist extension    Wrist ulnar deviation    Wrist radial deviation    Wrist pronation [facility] WFL  Wrist supination WFL WFL  (Blank rows = not tested)   UPPER EXTREMITY MMT:  MMT Right  Left   Shoulder flexion 3 4  Shoulder extension    Shoulder abduction 3 4-  Shoulder adduction    Shoulder internal rotation 3- 4+  Shoulder external rotation 3 4  Middle trapezius    Lower trapezius    Elbow  flexion 4- 4  Elbow extension 3 4+  Wrist flexion 4+ 4+  Wrist extension 4+ 4+  Wrist ulnar deviation    Wrist radial deviation    Wrist pronation    Wrist supination    Grip strength (lbs) 28.3# 23.1#  (Blank rows = not tested) Lumbricals: R:5, L:5                                                                                                                           TREATMENT DATE: 12/13/2023  Subjective:  Pt. had MD f/u yesterday and Dr. Marchia wants an MRI to assess R  shoulder after seeing x-ray.  Pt. Reports 6/10 R shoulder pain prior to tx. Session.  MRI appt. Is not scheduled at this time.    There.ex.:  Seated UBE 3 min. F/b (warm-up)- discussed MD f/u.  Standing wall ladder with R shoulder (flexion 5x).    Standing R shoulder AROM (all planes)- mirror feedback.  PT cuing/ assist as needed.    Standing RTB ex.: scap. Retraction/ sh. Extension/ tricep ext./ horizontal shoulder flexion/ ER 10x2 each.  Moderate PT cuing for proper technique.  Pt. Tends to overcompensate with bicep during sh. Flexion.    Supine R shoulder manual isometrics: ER/IR 10x each.   Manual tx.:  Supine R shoulder AA/PROM into flexion, scaption, ER and IR 10x each with static holds.    Reassessment of R shoulder capsular mobility.     PATIENT EDUCATION: Education details: See HEP Person educated: Patient Education method: Explanation, Demonstration, and Handouts Education comprehension: verbalized understanding and returned demonstration  HOME EXERCISE PROGRAM: Access Code: [email or web address] URL: [email or web address] Date: 09/20/2023 Prepared by: Ozell Sero  Exercises - Seated Bicep Curls with Bar  - 2 x daily - 7 x weekly - 2 sets - 10 reps - Seated Forearm Pronation and Supination AROM  - 2 x daily - 7 x weekly - 2 sets - 10 reps - Seated Shoulder Flexion AAROM with Pulley Behind  - 2 x daily - 7 x weekly - 2 sets - 10 reps - Seated Shoulder Scaption AAROM with Pulley at  Side  - 2 x daily - 7 x weekly - 2 sets - 10 reps - Seated Shoulder Abduction AAROM with Pulley Behind  - 2 x daily - 7 x weekly - 2 sets - 10 reps - Circular Shoulder Pendulum with Table Support  - 2 x daily - 7 x weekly - 2 sets - 10 reps  Access Code: [email or web address] URL: [email or web address] Date: 10/23/2023 Prepared by: Ozell Sero Exercises - Scapular Retraction with Resistance  - 1 x daily - 7 x weekly - 2 sets - 10 reps - Shoulder extension with resistance - Neutral  - 1 x daily - 7 x weekly - 2 sets - 10 reps - Shoulder External Rotation and Scapular Retraction with Resistance  - 1 x daily - 7 x weekly - 2 sets - 10 reps  ASSESSMENT:  CLINICAL IMPRESSION: Pt. remains limited by R shoulder/elbow crepitus and pain with progressive ROM.  Tx. Focus today on R shoulder stability with proper form/ technique to prevent compensatory movement patterns.  R shoulder flexion/ abduction >105 deg. Without compensatory movement patterns but increase compensation during resisted ex.  Pt. will continue to benefit from skilled PT services to increase R shoulder ROM/ strength to improve overhead reaching/ pain-free mobility.    OBJECTIVE IMPAIRMENTS: decreased activity tolerance, decreased mobility, decreased ROM, decreased strength, hypomobility, impaired flexibility, impaired UE functional use, improper body mechanics, postural dysfunction, and pain.   ACTIVITY LIMITATIONS: carrying, lifting, bathing, toileting, dressing, self feeding, reach over head, and hygiene/grooming  PARTICIPATION LIMITATIONS: cleaning, laundry, driving, shopping, community activity, and occupation  PERSONAL FACTORS: Fitness and Past/current experiences are also affecting patient's functional outcome.   REHAB POTENTIAL: Good  CLINICAL DECISION MAKING: Evolving/moderate complexity  EVALUATION COMPLEXITY: Moderate   GOALS: Goals reviewed with patient? Yes  LONG TERM GOALS: Target date: 01/22/24  Pt. Will  decrease QuickDASH to <50% to improve UE functional mobility.   Baseline:  90.9%.  5/29: 73% (improvement noted).  7/1: 81.4% Goal status: Partially met  2.  Pt. Will increase R shoulder flexion AROM to >120 deg. To reaching overhead/ manage hair.   Baseline:  no AROM at time of evaluation.  5/29: Supine R shoulder AROM 128 deg./ seated 108 deg.  Goal status: Partially met  3.  Pt. Will present with 4/5 MMT in R UE to improve daily household/ work-related tasks.   Baseline:  see above Goal status: Partially met  4.  Pt. Will report no R shoulder pain/limitations with daily tasks to improve return to PLOF.   Baseline:  R shoulder pain/ limited.  Goal status: Not met  PLAN:  PT FREQUENCY: 2x/week  PT DURATION: 6 weeks  PLANNED INTERVENTIONS: 97110-Therapeutic exercises, 97530- Therapeutic activity, [id number]- Neuromuscular re-education, 97535- Self Care, [id number]- Manual therapy, [id number]- Electrical stimulation (unattended), Patient/Family education, Joint mobilization, Cryotherapy, and Moist heat  PLAN FOR NEXT SESSION:  Check MRI appt.   Ozell JAYSON Sero, PT, DPT # [id number] 12/13/2023, 4:08 PM

## 2023-12-18 ENCOUNTER — Ambulatory Visit: Admitting: Physical Therapy

## 2023-12-18 ENCOUNTER — Ambulatory Visit
Admission: RE | Admit: 2023-12-18 | Discharge: 2023-12-18 | Disposition: A | Discharge status: Home / Self Care | Source: Ambulatory Visit | Attending: Orthopedic Surgery | Admitting: Orthopedic Surgery

## 2023-12-18 DIAGNOSIS — S42201D Unspecified fracture of upper end of right humerus, subsequent encounter for fracture with routine healing: Secondary | ICD-10-CM | POA: Diagnosis present

## 2023-12-19 ENCOUNTER — Ambulatory Visit
Admission: RE | Admit: 2023-12-19 | Discharge: 2023-12-19 | Disposition: A | Discharge status: Home / Self Care | Source: Ambulatory Visit | Attending: Family Medicine | Admitting: Family Medicine

## 2023-12-19 DIAGNOSIS — Z1231 Encounter for screening mammogram for malignant neoplasm of breast: Secondary | ICD-10-CM | POA: Insufficient documentation

## 2023-12-20 ENCOUNTER — Ambulatory Visit: Admitting: Physical Therapy

## 2023-12-25 ENCOUNTER — Ambulatory Visit: Admitting: Physical Therapy

## 2023-12-27 ENCOUNTER — Encounter: Admitting: Physical Therapy

## 2024-01-01 ENCOUNTER — Encounter: Admitting: Physical Therapy

## 2024-01-02 ENCOUNTER — Ambulatory Visit: Admitting: Physical Therapy

## 2024-01-02 DIAGNOSIS — M25611 Stiffness of right shoulder, not elsewhere classified: Secondary | ICD-10-CM

## 2024-01-02 DIAGNOSIS — R293 Abnormal posture: Secondary | ICD-10-CM

## 2024-01-02 DIAGNOSIS — G8929 Other chronic pain: Secondary | ICD-10-CM

## 2024-01-02 DIAGNOSIS — M6281 Muscle weakness (generalized): Secondary | ICD-10-CM

## 2024-01-02 NOTE — Therapy (Signed)
 OUTPATIENT PHYSICAL THERAPY SHOULDER TREATMENT  Patient Name: Mariah Reeves MRN: [id number] DOB:[birth year], 58 y.o., female Today's Date: 01/03/2024  END OF SESSION:  PT End of Session - 01/02/24 1033     Visit Number 14    Number of Visits 24    Date for PT Re-Evaluation 01/22/24    PT Start Time 1033    PT Stop Time 1111    PT Time Calculation (min) 38 min    Behavior During Therapy WFL for tasks assessed/performed          Past Medical History:  Diagnosis Date   Allergy    Anomaly, uterus    tumor of uterus   Anxiety    Bilateral occipital neuralgia    Bipolar 1 disorder (HCC)    Chronic back pain    Complication of anesthesia    hard time waking up after gastric bypass.Pt stated that she has a small mouth and has had several surgeries since gastric bypass with no problems.   Degenerative disc disease, lumbar    Depression    Facet syndrome, lumbar    Family history of breast cancer    2/21 cancer genetic testing letter sent   GERD (gastroesophageal reflux disease)    before gastric bypass   Headache    Migraines   History of kidney stones    Left   Hx of vertigo    Patella fracture    Left   PTSD (post-traumatic stress disorder)    Sleep apnea    C-PAP   Wears glasses    Past Surgical History:  Procedure Laterality Date   ABDOMINOPLASTY  2006   CARPAL TUNNEL RELEASE Left    CHOLECYSTECTOMY     COLONOSCOPY W/ POLYPECTOMY     CYSTOSCOPY W/ URETERAL STENT PLACEMENT  01/03/2017   Procedure: CYSTOSCOPY WITH RETROGRADE PYELOGRAM/URETERAL STENT PLACEMENT;  Surgeon: Chauncey Redell Agent, MD;  Location: [facility];  Service: Urology;;   CYSTOSCOPY W/ URETERAL STENT PLACEMENT Left 01/17/2017   Procedure: CYSTOSCOPY WITH STENT REPLACEMENT;  Surgeon: Chauncey Redell Agent, MD;  Location: [facility];  Service: Urology;  Laterality: Left;   DILATATION & CURETTAGE/HYSTEROSCOPY WITH MYOSURE N/A 07/08/2015   Procedure: DILATATION & CURETTAGE/HYSTEROSCOPY WITH MYOSURE;  Surgeon:  Lamar SHAUNNA Lesches, MD;  Location: [facility];  Service: Gynecology;  Laterality: N/A;   DILATION AND CURETTAGE OF UTERUS     1/17   EXTRACORPOREAL SHOCK WAVE LITHOTRIPSY Left 12/07/2016   Procedure: EXTRACORPOREAL SHOCK WAVE LITHOTRIPSY (ESWL);  Surgeon: Chauncey Redell Agent, MD;  Location: [facility];  Service: Urology;  Laterality: Left;   GASTRIC BYPASS  2005   KNEE SURGERY Right    ACL- repair   LITHOTRIPSY     MIDDLE EAR SURGERY     NASAL SEPTUM SURGERY     OVARY SURGERY Right    ROTATOR CUFF REPAIR     URETEROSCOPY WITH HOLMIUM LASER LITHOTRIPSY     perforated ureter. had nephrostomy tube for brief time   URETEROSCOPY WITH HOLMIUM LASER LITHOTRIPSY Left 01/17/2017   Procedure: URETEROSCOPY WITH HOLMIUM LASER LITHOTRIPSY;  Surgeon: Chauncey Redell Agent, MD;  Location: [facility];  Service: Urology;  Laterality: Left;   Patient Active Problem List   Diagnosis Date Noted   Complex sleep apnea syndrome 08/08/2021   Encounter for BiPAP use counseling 08/08/2021   Mass of upper inner quadrant of left breast 07/19/2021   Vaginal atrophy 07/19/2021   Family history of breast cancer 07/19/2021   OSA on CPAP 04/19/2020  CPAP use counseling 04/19/2020   Nephrolithiasis 01/26/2017   Left ureteral stone 01/03/2017   Endometrial disorder 07/08/2015   Bilateral occipital neuralgia 03/02/2015   DDD (degenerative disc disease), lumbar 12/24/2014   Facet syndrome, lumbar 12/24/2014   Sacroiliac joint dysfunction 12/24/2014   PCP: Derick Leita POUR, MD  REFERRING PROVIDER: Danella Donnice RIGGERS   REFERRING DIAG: D57.798I (ICD-10-CM) - Unspecified fracture of upper end of right humerus, subsequent encounter for fracture with routine healing  THERAPY DIAG:  Muscle weakness (generalized)  Shoulder joint stiffness, right  Chronic right shoulder pain  Abnormal posture  Rationale for Evaluation and Treatment: Rehabilitation  ONSET DATE: 07/30/23  Fall resulting in R proximal humerus  fx  SUBJECTIVE:                                                                                                                                                                                      SUBJECTIVE STATEMENT: Pt. Fell on 2/17 while bring the trash bin in resulting in R proximal humerus fracture.  Pt. States the last x-ray revealed fracture is healed.  Pt. Reports 8/10 R shoulder pain at worst and demonstrates minimal movement.  Pt. Sleeping on back with R shoulder off the pillow.  Pt. 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  Pt. Is taking Oxy for pain and remains guarded with R shoulder movement.  Pt. States she has been doing pendulum ex.   Hand dominance: Right  PERTINENT HISTORY: Pt. Well known to PT clinic.  See MD notes.   PAIN:  Are you having pain? Yes: NPRS scale: 8/10 Pain location: R proximal shoulder Pain description: aching/ sharp Aggravating factors: movement Relieving factors: meds/ rest  PRECAUTIONS: Shoulder  RED FLAGS: None   WEIGHT BEARING RESTRICTIONS: No  FALLS:  Has patient fallen in last 6 months? Yes. Number of falls 1 with injury  LIVING ENVIRONMENT: Lives with: lives with their family and lives alone Has following equipment at home: None  OCCUPATION: Editor, commissioning at [employer]  PLOF: Independent  PATIENT GOALS: Increase R shoulder ROM/ strength/ return to [employer]  NEXT MD VISIT:   OBJECTIVE:  Note: Objective measures were completed at Evaluation unless otherwise noted.  DIAGNOSTIC FINDINGS:  See imaging  PATIENT SURVEYS:  Quick Dash 90.9% significant limitations  COGNITION: Overall cognitive status: Within functional limits for tasks assessed     SENSATION: WFL  POSTURE: Forward head/ rounded shoulder posture  UPPER EXTREMITY ROM:   Active ROM Right PROM eval Left AROM eval  Shoulder flexion 122 deg. 150 deg.  Shoulder extension    Shoulder abduction 81 deg. 150 deg.  Shoulder adduction  Shoulder internal rotation 74 deg. 85 deg.  Shoulder external rotation 18 deg. 85 deg.  Elbow flexion [facility] WFL  Elbow extension    Wrist flexion    Wrist extension    Wrist ulnar deviation    Wrist radial deviation    Wrist pronation    Wrist supination limited   (Blank rows = not tested)  No AROM in R shoulder flexion/ abduction.  R forearm limited supination as compared to L.  UPPER EXTREMITY MMT:  MMT Right eval Left eval  Shoulder flexion Unable to test R UE   Shoulder extension    Shoulder abduction    Shoulder adduction    Shoulder internal rotation    Shoulder external rotation    Middle trapezius    Lower trapezius    Elbow flexion    Elbow extension    Wrist flexion    Wrist extension    Wrist ulnar deviation    Wrist radial deviation    Wrist pronation    Wrist supination    Grip strength (lbs) 10.6# 19.8%  (Blank rows = not tested)  SHOULDER SPECIAL TESTS: Not able to test at this time  JOINT MOBILITY TESTING:  Guarded movement.  Significant crepitus  PALPATION:  R/L mid-bicep circumference: 39 cm/ 28.5 cm.   10.5 cm difference.  Tenderness  R shoulder AROM: supine (126 deg.), seated (108 deg.).   UPPER EXTREMITY ROM:   Active ROM Right AROM eval Left AROM eval  Shoulder flexion 109 deg. 142 deg.  Shoulder extension    Shoulder abduction 72 deg. 138 deg.  Shoulder adduction    Shoulder internal rotation 44 deg. 88 deg.  Shoulder external rotation 18 deg. 82 deg.  Elbow flexion [facility] WFL  Elbow extension    Wrist flexion    Wrist extension    Wrist ulnar deviation    Wrist radial deviation    Wrist pronation [facility] WFL  Wrist supination WFL WFL  (Blank rows = not tested)   UPPER EXTREMITY MMT:  MMT Right  Left   Shoulder flexion 3 4  Shoulder extension    Shoulder abduction 3 4-  Shoulder adduction    Shoulder internal rotation 3- 4+  Shoulder external rotation 3 4  Middle trapezius    Lower trapezius    Elbow flexion 4- 4   Elbow extension 3 4+  Wrist flexion 4+ 4+  Wrist extension 4+ 4+  Wrist ulnar deviation    Wrist radial deviation    Wrist pronation    Wrist supination    Grip strength (lbs) 28.3# 23.1#  (Blank rows = not tested) Lumbricals: R:5, L:5                                                                                                                           TREATMENT DATE: 01/03/2024  Subjective:  Pt.  Reports her MRI showed the bones haven't healed, and that she can't have surgery d/t chemo. She's been  referred to a specialist in [facility] for difficult fractures, is waiting to hear from them. Pt reports no pain today, but tenderness with movement. Pt reports irritation with ADLs (7/10 NPS).  Thereact:  QuickDASH: 63.64% (marked improvement)  UPPER EXTREMITY ROM:   Active ROM Right AROM eval Left AROM eval  Shoulder flexion 95 deg. 148 deg.  Shoulder extension    Shoulder abduction 67 deg. 154 deg.  Shoulder adduction    Shoulder internal rotation 54 deg. 100 deg.  Shoulder external rotation 35 deg. 83 deg.  Elbow flexion [facility] WFL  Elbow extension    Wrist flexion    Wrist extension    Wrist ulnar deviation    Wrist radial deviation    Wrist pronation [facility] WFL  Wrist supination WFL WFL  (Blank rows = not tested)   UPPER EXTREMITY MMT: NT due to unhealed fracture    There.ex.: Supine R shoulder manual isometrics: flexion/ abd/ ER/ IR 10x each with 5 sec hold   10x Supine AAROM overhead flexion with dowel rod 10x L scapular punches  1x L supine ABCs 15x seated B scapular retractions with tactile cues  4x 30 sec L washcloth circles on wall in pain tolerable range in standing     PATIENT EDUCATION: Education details: See HEP Person educated: Patient Education method: Explanation, Demonstration, and Handouts Education comprehension: verbalized understanding and returned demonstration  HOME EXERCISE PROGRAM: Access Code: [email or web address] URL:  [email or web address] Date: 09/20/2023 Prepared by: Ozell Sero  Exercises - Seated Bicep Curls with Bar  - 2 x daily - 7 x weekly - 2 sets - 10 reps - Seated Forearm Pronation and Supination AROM  - 2 x daily - 7 x weekly - 2 sets - 10 reps - Seated Shoulder Flexion AAROM with Pulley Behind  - 2 x daily - 7 x weekly - 2 sets - 10 reps - Seated Shoulder Scaption AAROM with Pulley at Side  - 2 x daily - 7 x weekly - 2 sets - 10 reps - Seated Shoulder Abduction AAROM with Pulley Behind  - 2 x daily - 7 x weekly - 2 sets - 10 reps - Circular Shoulder Pendulum with Table Support  - 2 x daily - 7 x weekly - 2 sets - 10 reps  Access Code: [email or web address] URL: [email or web address] Date: 10/23/2023 Prepared by: Ozell Sero Exercises - Scapular Retraction with Resistance  - 1 x daily - 7 x weekly - 2 sets - 10 reps - Shoulder extension with resistance - Neutral  - 1 x daily - 7 x weekly - 2 sets - 10 reps - Shoulder External Rotation and Scapular Retraction with Resistance  - 1 x daily - 7 x weekly - 2 sets - 10 reps  ASSESSMENT:  CLINICAL IMPRESSION: Pt. remains limited by R shoulder pain with progressive ROM due to unhealed fracture.  Tx. Focus today on R shoulder stability within pain tolerable range. Pt tolerated treatment well, limited by pain and weakness. Pt scored a 63.64% on the Quick DASH, which is an improvement from eval (90.9%), but still indicates moderate limitations in use of her RUE. Pt's R shoulder ROM has decreased due to her fracture, and PT was unable to perform MMT.  Pt. Will have MD appt. With shoulder specialist in [place] to determine POC.  Pt. will continue to benefit from skilled PT services to increase R shoulder ROM/ strength to improve overhead reaching/ pain-free mobility.    OBJECTIVE IMPAIRMENTS: decreased activity tolerance, decreased mobility, decreased ROM, decreased  strength, hypomobility, impaired flexibility, impaired UE functional  use, improper body mechanics, postural dysfunction, and pain.   ACTIVITY LIMITATIONS: carrying, lifting, bathing, toileting, dressing, self feeding, reach over head, and hygiene/grooming  PARTICIPATION LIMITATIONS: cleaning, laundry, driving, shopping, community activity, and occupation  PERSONAL FACTORS: Fitness and Past/current experiences are also affecting patient's functional outcome.   REHAB POTENTIAL: Good  CLINICAL DECISION MAKING: Evolving/moderate complexity  EVALUATION COMPLEXITY: Moderate   GOALS: Goals reviewed with patient? Yes  LONG TERM GOALS: Target date: 01/22/24  Pt. Will decrease QuickDASH to <50% to improve UE functional mobIlity.   Baseline:  90.9%.  5/29: 73% (improvement noted).  7/1: 81.4%.  7/23: 63.64% Goal status: Partially met  2.  Pt. Will increase R shoulder flexion AROM to >120 deg. To reaching overhead/ manage hair.   Baseline:  no AROM at time of evaluation.  5/29: Supine R shoulder AROM 128 deg./ seated 108 deg. 7/23: R shoulder flexion AROM 95 deg seated  Goal status: Partially met  3.  Pt. Will present with 4/5 MMT in R UE to improve daily household/ work-related tasks.   Baseline:  see above. 7/23: unable to test due to unhealed fracture Goal status: Partially met  4.  Pt. Will report no R shoulder pain/limitations with daily tasks to improve return to PLOF.   Baseline:  R shoulder pain/ limited. 7/23: R shoulder pain/ limited  Goal status: Not met  PLAN:  PT FREQUENCY: 2x/week  PT DURATION: 6 weeks  PLANNED INTERVENTIONS: 97110-Therapeutic exercises, 97530- Therapeutic activity, 97112- Neuromuscular re-education, 97535- Self Care, [id number]- Manual therapy, [id number]- Electrical stimulation (unattended), Patient/Family education, Joint mobilization, Cryotherapy, and Moist heat  PLAN FOR NEXT SESSION:  Check MD f/u with specialist   Ozell JAYSON Sero, PT, DPT # [id number] Cheron Last, SPT 01/03/2024, 7:07 AM

## 2024-01-03 ENCOUNTER — Encounter: Admitting: Physical Therapy

## 2024-01-08 ENCOUNTER — Encounter: Admitting: Physical Therapy

## 2024-01-09 ENCOUNTER — Ambulatory Visit: Admitting: Physical Therapy

## 2024-01-09 ENCOUNTER — Encounter: Payer: Self-pay | Admitting: Physical Therapy

## 2024-01-09 DIAGNOSIS — M6281 Muscle weakness (generalized): Secondary | ICD-10-CM

## 2024-01-09 DIAGNOSIS — G8929 Other chronic pain: Secondary | ICD-10-CM

## 2024-01-09 DIAGNOSIS — M25611 Stiffness of right shoulder, not elsewhere classified: Secondary | ICD-10-CM

## 2024-01-09 DIAGNOSIS — R293 Abnormal posture: Secondary | ICD-10-CM

## 2024-01-09 NOTE — Therapy (Signed)
 OUTPATIENT PHYSICAL THERAPY SHOULDER TREATMENT  Patient Name: Mariah Reeves MRN: [id number] DOB:01/02/1966, 58 y.o., female Today's Date: 01/10/2024  END OF SESSION:  PT End of Session - 01/09/24 1051     Visit Number 15    Number of Visits 24    Date for PT Re-Evaluation 01/22/24    PT Start Time 1047    PT Stop Time 1116    PT Time Calculation (min) 29 min    Behavior During Therapy WFL for tasks assessed/performed          Past Medical History:  Diagnosis Date   Allergy    Anomaly, uterus    tumor of uterus   Anxiety    Bilateral occipital neuralgia    Bipolar 1 disorder (HCC)    Chronic back pain    Complication of anesthesia    hard time waking up after gastric bypass.Pt stated that she has a small mouth and has had several surgeries since gastric bypass with no problems.   Degenerative disc disease, lumbar    Depression    Facet syndrome, lumbar    Family history of breast cancer    2/21 cancer genetic testing letter sent   GERD (gastroesophageal reflux disease)    before gastric bypass   Headache    Migraines   History of kidney stones    Left   Hx of vertigo    Patella fracture    Left   PTSD (post-traumatic stress disorder)    Sleep apnea    C-PAP   Wears glasses    Past Surgical History:  Procedure Laterality Date   ABDOMINOPLASTY  2006   CARPAL TUNNEL RELEASE Left    CHOLECYSTECTOMY     COLONOSCOPY W/ POLYPECTOMY     CYSTOSCOPY W/ URETERAL STENT PLACEMENT  01/03/2017   Procedure: CYSTOSCOPY WITH RETROGRADE PYELOGRAM/URETERAL STENT PLACEMENT;  Surgeon: Chauncey Redell Agent, MD;  Location: [facility];  Service: Urology;;   CYSTOSCOPY W/ URETERAL STENT PLACEMENT Left 01/17/2017   Procedure: CYSTOSCOPY WITH STENT REPLACEMENT;  Surgeon: Chauncey Redell Agent, MD;  Location: [facility];  Service: Urology;  Laterality: Left;   DILATATION & CURETTAGE/HYSTEROSCOPY WITH MYOSURE N/A 07/08/2015   Procedure: DILATATION & CURETTAGE/HYSTEROSCOPY WITH MYOSURE;  Surgeon:  Lamar SHAUNNA Lesches, MD;  Location: [facility];  Service: Gynecology;  Laterality: N/A;   DILATION AND CURETTAGE OF UTERUS     1/17   EXTRACORPOREAL SHOCK WAVE LITHOTRIPSY Left 12/07/2016   Procedure: EXTRACORPOREAL SHOCK WAVE LITHOTRIPSY (ESWL);  Surgeon: Chauncey Redell Agent, MD;  Location: [facility];  Service: Urology;  Laterality: Left;   GASTRIC BYPASS  2005   KNEE SURGERY Right    ACL- repair   LITHOTRIPSY     MIDDLE EAR SURGERY     NASAL SEPTUM SURGERY     OVARY SURGERY Right    ROTATOR CUFF REPAIR     URETEROSCOPY WITH HOLMIUM LASER LITHOTRIPSY     perforated ureter. had nephrostomy tube for brief time   URETEROSCOPY WITH HOLMIUM LASER LITHOTRIPSY Left 01/17/2017   Procedure: URETEROSCOPY WITH HOLMIUM LASER LITHOTRIPSY;  Surgeon: Chauncey Redell Agent, MD;  Location: [facility];  Service: Urology;  Laterality: Left;   Patient Active Problem List   Diagnosis Date Noted   Complex sleep apnea syndrome 08/08/2021   Encounter for BiPAP use counseling 08/08/2021   Mass of upper inner quadrant of left breast 07/19/2021   Vaginal atrophy 07/19/2021   Family history of breast cancer 07/19/2021   OSA on CPAP 04/19/2020  CPAP use counseling 04/19/2020   Nephrolithiasis 01/26/2017   Left ureteral stone 01/03/2017   Endometrial disorder 07/08/2015   Bilateral occipital neuralgia 03/02/2015   DDD (degenerative disc disease), lumbar 12/24/2014   Facet syndrome, lumbar 12/24/2014   Sacroiliac joint dysfunction 12/24/2014   PCP: Derick Leita POUR, MD  REFERRING PROVIDER: Danella Donnice RIGGERS   REFERRING DIAG: D57.798I (ICD-10-CM) - Unspecified fracture of upper end of right humerus, subsequent encounter for fracture with routine healing  THERAPY DIAG:  Muscle weakness (generalized)  Shoulder joint stiffness, right  Chronic right shoulder pain  Abnormal posture  Rationale for Evaluation and Treatment: Rehabilitation  ONSET DATE: 07/30/23  Fall resulting in R proximal humerus  fx  SUBJECTIVE:                                                                                                                                                                                      SUBJECTIVE STATEMENT: Pt. Fell on 2/17 while bring the trash bin in resulting in R proximal humerus fracture.  Pt. States the last x-ray revealed fracture is healed.  Pt. Reports 8/10 R shoulder pain at worst and demonstrates minimal movement.  Pt. Sleeping on back with R shoulder off the pillow.  Pt. 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  Pt. Is taking Oxy for pain and remains guarded with R shoulder movement.  Pt. States she has been doing pendulum ex.   Hand dominance: Right  PERTINENT HISTORY: Pt. Well known to PT clinic.  See MD notes.   PAIN:  Are you having pain? Yes: NPRS scale: 8/10 Pain location: R proximal shoulder Pain description: aching/ sharp Aggravating factors: movement Relieving factors: meds/ rest  PRECAUTIONS: Shoulder  RED FLAGS: None   WEIGHT BEARING RESTRICTIONS: No  FALLS:  Has patient fallen in last 6 months? Yes. Number of falls 1 with injury  LIVING ENVIRONMENT: Lives with: lives with their family and lives alone Has following equipment at home: None  OCCUPATION: Editor, commissioning at [employer]  PLOF: Independent  PATIENT GOALS: Increase R shoulder ROM/ strength/ return to [employer]  NEXT MD VISIT:   OBJECTIVE:  Note: Objective measures were completed at Evaluation unless otherwise noted.  DIAGNOSTIC FINDINGS:  See imaging  PATIENT SURVEYS:  Quick Dash 90.9% significant limitations  COGNITION: Overall cognitive status: Within functional limits for tasks assessed     SENSATION: WFL  POSTURE: Forward head/ rounded shoulder posture  UPPER EXTREMITY ROM:   Active ROM Right PROM eval Left AROM eval  Shoulder flexion 122 deg. 150 deg.  Shoulder extension    Shoulder abduction 81 deg. 150 deg.  Shoulder adduction  Shoulder internal rotation 74 deg. 85 deg.  Shoulder external rotation 18 deg. 85 deg.  Elbow flexion [facility] WFL  Elbow extension    Wrist flexion    Wrist extension    Wrist ulnar deviation    Wrist radial deviation    Wrist pronation    Wrist supination limited   (Blank rows = not tested)  No AROM in R shoulder flexion/ abduction.  R forearm limited supination as compared to L.  UPPER EXTREMITY MMT:  MMT Right eval Left eval  Shoulder flexion Unable to test R UE   Shoulder extension    Shoulder abduction    Shoulder adduction    Shoulder internal rotation    Shoulder external rotation    Middle trapezius    Lower trapezius    Elbow flexion    Elbow extension    Wrist flexion    Wrist extension    Wrist ulnar deviation    Wrist radial deviation    Wrist pronation    Wrist supination    Grip strength (lbs) 10.6# 19.8%  (Blank rows = not tested)  SHOULDER SPECIAL TESTS: Not able to test at this time  JOINT MOBILITY TESTING:  Guarded movement.  Significant crepitus  PALPATION:  R/L mid-bicep circumference: 39 cm/ 28.5 cm.   10.5 cm difference.  Tenderness  R shoulder AROM: supine (126 deg.), seated (108 deg.).   UPPER EXTREMITY ROM:   Active ROM Right AROM eval Left AROM eval  Shoulder flexion 109 deg. 142 deg.  Shoulder extension    Shoulder abduction 72 deg. 138 deg.  Shoulder adduction    Shoulder internal rotation 44 deg. 88 deg.  Shoulder external rotation 18 deg. 82 deg.  Elbow flexion [facility] WFL  Elbow extension    Wrist flexion    Wrist extension    Wrist ulnar deviation    Wrist radial deviation    Wrist pronation [facility] WFL  Wrist supination WFL WFL  (Blank rows = not tested)   UPPER EXTREMITY MMT:  MMT Right  Left   Shoulder flexion 3 4  Shoulder extension    Shoulder abduction 3 4-  Shoulder adduction    Shoulder internal rotation 3- 4+  Shoulder external rotation 3 4  Middle trapezius    Lower trapezius    Elbow flexion 4- 4   Elbow extension 3 4+  Wrist flexion 4+ 4+  Wrist extension 4+ 4+  Wrist ulnar deviation    Wrist radial deviation    Wrist pronation    Wrist supination    Grip strength (lbs) 28.3# 23.1#  (Blank rows = not tested) Lumbricals: R:5, L:5  QuickDASH: 63.64% (marked improvement)  UPPER EXTREMITY ROM:   Active ROM Right AROM eval Left AROM eval  Shoulder flexion 95 deg. 148 deg.  Shoulder extension    Shoulder abduction 67 deg. 154 deg.  Shoulder adduction    Shoulder internal rotation 54 deg. 100 deg.  Shoulder external rotation 35 deg. 83 deg.  Elbow flexion [facility] WFL  Elbow extension    Wrist flexion    Wrist extension    Wrist ulnar deviation    Wrist radial deviation    Wrist pronation [facility] WFL  Wrist supination WFL WFL  (Blank rows = not tested)   UPPER EXTREMITY MMT: NT due to unhealed fracture  TREATMENT DATE: 01/10/2024  Subjective:  Pt. Still has no MD f/u with specialist scheduled.  Pt. reports 6/10 R shoulder pain with movement.  No new complaints.    There.ex.:  B UBE 3 min. F/b. (Warm-up)- discussed shoulder pain symptoms/ daily tasks.   Standing wall ladder (flexion/ scaption)- 5x.   Pain tolerable range.   Supine R shoulder manual isometrics: flexion/ abd/ ER/ IR 10x each with 5 sec hold.   Supine R shoulder AAROM overhead flexion with dowel rod (PT assist) Supine R serratus punches/ supine rhythmic stabs (min./mod. resistance)- 30 sec. X 4.    Review of HEP  PATIENT EDUCATION: Education details: See HEP Person educated: Patient Education method: Explanation, Demonstration, and Handouts Education comprehension: verbalized understanding and returned demonstration  HOME EXERCISE PROGRAM: Access Code: [email or web address] URL: [email or web address] Date: 09/20/2023 Prepared by: Ozell Sero  Exercises - Seated Bicep  Curls with Bar  - 2 x daily - 7 x weekly - 2 sets - 10 reps - Seated Forearm Pronation and Supination AROM  - 2 x daily - 7 x weekly - 2 sets - 10 reps - Seated Shoulder Flexion AAROM with Pulley Behind  - 2 x daily - 7 x weekly - 2 sets - 10 reps - Seated Shoulder Scaption AAROM with Pulley at Side  - 2 x daily - 7 x weekly - 2 sets - 10 reps - Seated Shoulder Abduction AAROM with Pulley Behind  - 2 x daily - 7 x weekly - 2 sets - 10 reps - Circular Shoulder Pendulum with Table Support  - 2 x daily - 7 x weekly - 2 sets - 10 reps  Access Code: [email or web address] URL: [email or web address] Date: 10/23/2023 Prepared by: Ozell Sero Exercises - Scapular Retraction with Resistance  - 1 x daily - 7 x weekly - 2 sets - 10 reps - Shoulder extension with resistance - Neutral  - 1 x daily - 7 x weekly - 2 sets - 10 reps - Shoulder External Rotation and Scapular Retraction with Resistance  - 1 x daily - 7 x weekly - 2 sets - 10 reps  ASSESSMENT:  CLINICAL IMPRESSION: No manual tx. Secondary unhealed fracture.  Pt. remains limited by R shoulder pain with progressive ROM and focus today on R shoulder stability within pain tolerable range. Pt tolerated treatment well, limited by pain and weakness. Pt. Will continue with current HEP/ isometrics in a pain tolerable range.  Pt. Will have MD appt. With shoulder specialist in [place] to determine POC.  Pt. will continue to benefit from skilled PT services to increase R shoulder ROM/ strength to improve overhead reaching/ pain-free mobility.    OBJECTIVE IMPAIRMENTS: decreased activity tolerance, decreased mobility, decreased ROM, decreased strength, hypomobility, impaired flexibility, impaired UE functional use, improper body mechanics, postural dysfunction, and pain.   ACTIVITY LIMITATIONS: carrying, lifting, bathing, toileting, dressing, self feeding, reach over head, and hygiene/grooming  PARTICIPATION LIMITATIONS: cleaning, laundry, driving,  shopping, community activity, and occupation  PERSONAL FACTORS: Fitness and Past/current experiences are also affecting patient's functional outcome.   REHAB POTENTIAL: Good  CLINICAL DECISION MAKING: Evolving/moderate complexity  EVALUATION COMPLEXITY: Moderate   GOALS: Goals reviewed with patient? Yes  LONG TERM GOALS: Target date: 01/22/24  Pt. Will decrease QuickDASH to <50% to improve UE functional mobIlity.   Baseline:  90.9%.  5/29: 73% (improvement noted).  7/1: 81.4%.  7/23: 63.64% Goal status: Partially met  2.  Pt. Will increase R shoulder flexion AROM to >120 deg. To reaching overhead/ manage  hair.   Baseline:  no AROM at time of evaluation.  5/29: Supine R shoulder AROM 128 deg./ seated 108 deg. 7/23: R shoulder flexion AROM 95 deg seated  Goal status: Partially met  3.  Pt. Will present with 4/5 MMT in R UE to improve daily household/ work-related tasks.   Baseline:  see above. 7/23: unable to test due to unhealed fracture Goal status: Partially met  4.  Pt. Will report no R shoulder pain/limitations with daily tasks to improve return to PLOF.   Baseline:  R shoulder pain/ limited. 7/23: R shoulder pain/ limited  Goal status: Not met  PLAN:  PT FREQUENCY: 2x/week  PT DURATION: 6 weeks  PLANNED INTERVENTIONS: 97110-Therapeutic exercises, 97530- Therapeutic activity, 97112- Neuromuscular re-education, 97535- Self Care, [id number]- Manual therapy, [id number]- Electrical stimulation (unattended), Patient/Family education, Joint mobilization, Cryotherapy, and Moist heat  PLAN FOR NEXT SESSION:  Check MD f/u with specialist   Ozell JAYSON Sero, PT, DPT # [phone] 01/10/2024, 12:35 PM

## 2024-01-11 NOTE — Progress Notes (Signed)
 [facility] [address] [place], [place] [address]  Pulmonary Sleep Medicine   Office Visit Note  Patient Name: Mariah Reeves DOB: Apr 26, 1966 MRN [id number]    Chief Complaint: Obstructive Sleep Apnea visit  Brief History:  Trinady is seen today for an annual follow up visit for BiPAP ASV@ EPAP min 8 max 15, PS min 6 max 15 cmH2O, rate auto. The patient has a 4 year history of sleep apnea. Patient is using PAP nightly.  The patient feels rested after sleeping with PAP.  The patient reports benefiting from PAP use. Reported sleepiness is  improved and the Epworth Sleepiness Score is 14 out of 24. The patient does not take naps. The patient complains of the following: pt is in need of supplies.  The compliance download shows 74% compliance with an average use time of 5 hours 55 minutes. The AHI is 9.3.  The patient does complain of limb movements disrupting sleep. The patient continues  to require PAP therapy in order to eliminate sleep apnea.   ROS  General: (-) fever, (-) chills, (-) night sweat Nose and Sinuses: (-) nasal stuffiness or itchiness, (-) postnasal drip, (-) nosebleeds, (-) sinus trouble. Mouth and Throat: (-) sore throat, (-) hoarseness. Neck: (-) swollen glands, (-) enlarged thyroid , (-) neck pain. Respiratory: - cough, - shortness of breath, - wheezing. Neurologic: - numbness, - tingling. Psychiatric: + anxiety, + depression   Current Medication: Outpatient Encounter Medications as of 01/14/2024  Medication Sig   oxyCODONE -acetaminophen  (PERCOCET) 10-325 MG tablet Take 1 tablet by mouth 4 (four) times daily as needed.   dasatinib (SPRYCEL) 80 MG tablet Take 80 mg by mouth daily.   estradiol  (ESTRACE ) 0.1 MG/GM vaginal cream Insert 1 g vaginally weekly as maintenance   lamoTRIgine  (LAMICTAL ) 200 MG tablet Take 400 mg by mouth at bedtime.    lamoTRIgine  (LAMICTAL ) 25 MG tablet Take 25 mg by mouth daily.   ondansetron  (ZOFRAN -ODT) 8 MG disintegrating tablet  Take by mouth.   pantoprazole (PROTONIX) 40 MG tablet Take 1 tablet by mouth daily.   triamcinolone  cream (KENALOG ) 0.5 % Apply topically 2 (two) times daily.   valACYclovir (VALTREX) 500 MG tablet Take 500 mg by mouth daily.   [DISCONTINUED] cyclobenzaprine  (FLEXERIL ) 10 MG tablet Take 10 mg by mouth at bedtime.   No facility-administered encounter medications on file as of 01/14/2024.    Surgical History: Past Surgical History:  Procedure Laterality Date   ABDOMINOPLASTY  2006   CARPAL TUNNEL RELEASE Left    CHOLECYSTECTOMY     COLONOSCOPY W/ POLYPECTOMY     CYSTOSCOPY W/ URETERAL STENT PLACEMENT  01/03/2017   Procedure: CYSTOSCOPY WITH RETROGRADE PYELOGRAM/URETERAL STENT PLACEMENT;  Surgeon: Chauncey Redell Agent, MD;  Location: [facility];  Service: Urology;;   CYSTOSCOPY W/ URETERAL STENT PLACEMENT Left 01/17/2017   Procedure: CYSTOSCOPY WITH STENT REPLACEMENT;  Surgeon: Chauncey Redell Agent, MD;  Location: [facility];  Service: Urology;  Laterality: Left;   DILATATION & CURETTAGE/HYSTEROSCOPY WITH MYOSURE N/A 07/08/2015   Procedure: DILATATION & CURETTAGE/HYSTEROSCOPY WITH MYOSURE;  Surgeon: Lamar SHAUNNA Lesches, MD;  Location: [facility];  Service: Gynecology;  Laterality: N/A;   DILATION AND CURETTAGE OF UTERUS     1/17   EXTRACORPOREAL SHOCK WAVE LITHOTRIPSY Left 12/07/2016   Procedure: EXTRACORPOREAL SHOCK WAVE LITHOTRIPSY (ESWL);  Surgeon: Chauncey Redell Agent, MD;  Location: [facility];  Service: Urology;  Laterality: Left;   GASTRIC BYPASS  2005   KNEE SURGERY Right    ACL- repair  LITHOTRIPSY     MIDDLE EAR SURGERY     NASAL SEPTUM SURGERY     OVARY SURGERY Right    ROTATOR CUFF REPAIR     URETEROSCOPY WITH HOLMIUM LASER LITHOTRIPSY     perforated ureter. had nephrostomy tube for brief time   URETEROSCOPY WITH HOLMIUM LASER LITHOTRIPSY Left 01/17/2017   Procedure: URETEROSCOPY WITH HOLMIUM LASER LITHOTRIPSY;  Surgeon: Chauncey Redell Agent, MD;  Location: [facility];  Service: Urology;   Laterality: Left;    Medical History: Past Medical History:  Diagnosis Date   Allergy    Anomaly, uterus    tumor of uterus   Anxiety    Bilateral occipital neuralgia    Bipolar 1 disorder (HCC)    Chronic back pain    Complication of anesthesia    hard time waking up after gastric bypass.Pt stated that she has a small mouth and has had several surgeries since gastric bypass with no problems.   Degenerative disc disease, lumbar    Depression    Facet syndrome, lumbar    Family history of breast cancer    2/21 cancer genetic testing letter sent   GERD (gastroesophageal reflux disease)    before gastric bypass   Headache    Migraines   History of kidney stones    Left   Hx of vertigo    Patella fracture    Left   PTSD (post-traumatic stress disorder)    Sleep apnea    C-PAP   Wears glasses     Family History: Non contributory to the present illness  Social History: Social History   Socioeconomic History   Marital status: Divorced    Spouse name: Not on file   Number of children: Not on file   Years of education: Not on file   Highest education level: Not on file  Occupational History   Not on file  Tobacco Use   Smoking status: Never   Smokeless tobacco: Never  Vaping Use   Vaping status: Never Used  Substance and Sexual Activity   Alcohol use: No   Drug use: No   Sexual activity: Not Currently    Birth control/protection: None  Other Topics Concern   Not on file  Social History Narrative   Not on file   Social Drivers of Health   Financial Resource Strain: Low Risk  (10/24/2023)   Received from [facility]   Overall Financial Resource Strain (CARDIA)    Difficulty of Paying Living Expenses: Not very hard  Food Insecurity: No Food Insecurity (10/24/2023)   Received [facility]   Hunger Vital Sign    Within the past 12 months, you worried that your food would run out before you got the money to buy more.: Never true    Within the past  12 months, the food you bought just didn't last and you didn't have money to get more.: Never true  Transportation Needs: No Transportation Needs (10/24/2023)   Received from [facility]   PRAPARE - Transportation    Lack of Transportation (Medical): No    Lack of Transportation (Non-Medical): No  Physical Activity: Inactive (07/26/2023)   Received from [facility]   Exercise Vital Sign    On average, how many days per week do you engage in moderate to strenuous exercise (like a brisk walk)?: 0 days    On average, how many minutes do you engage in exercise at this level?: 10 min  Stress: Stress  Concern Present (07/26/2023)   Received from [facility] - Occupational Stress Questionnaire    Feeling of Stress : Very much  Social Connections: Unknown (10/25/2021)   Received from [facility]   Social Network    Social Network: Not on file  Intimate Partner Violence: Unknown (09/16/2021)   Received from [facility]   HITS    Physically Hurt: Not on file    Insult or Talk Down To: Not on file    Threaten Physical Harm: Not on file    Scream or Curse: Not on file    Vital Signs: Blood pressure (!) 143/84, pulse (!) 113, resp. rate 16, height 4' 11 (1.499 m), weight 184 lb (83.5 kg), last menstrual period 12/01/2016, SpO2 97%. Body mass index is 37.16 kg/m.    Examination: General Appearance: The patient is well-developed, well-nourished, and in no distress. Neck Circumference: 38 cm Skin: Gross inspection of skin unremarkable. Head: normocephalic, no gross deformities. Eyes: no gross deformities noted. ENT: ears appear grossly normal Neurologic: Alert and oriented. No involuntary movements.  STOP BANG RISK ASSESSMENT S (snore) Have you been told that you snore?     NO   T (tired) Are you often tired, fatigued, or sleepy during the day?   NO  O (obstruction) Do you stop breathing, choke, or gasp during sleep? NO   P  (pressure) Do you have or are you being treated for high blood pressure? NO   B (BMI) Is your body index greater than 35 kg/m? NO   A (age) Are you 70 years old or older? YES   N (neck) Do you have a neck circumference greater than 16 inches?   NO   G (gender) Are you a female? NO   TOTAL STOP/BANG "YES" ANSWERS 1       A STOP-Bang score of 2 or less is considered low risk, and a score of 5 or more is high risk for having either moderate or severe OSA. For people who score 3 or 4, doctors may need to perform further assessment to determine how likely they are to have OSA.         EPWORTH SLEEPINESS SCALE:  Scale:  (0)= no chance of dozing; (1)= slight chance of dozing; (2)= moderate chance of dozing; (3)= high chance of dozing  Chance  Situtation    Sitting and reading: 1    Watching TV: 2    Sitting Inactive in public: 3    As a passenger in car: 2      Lying down to rest: 2    Sitting and talking: 2    Sitting quielty after lunch: 1    In a car, stopped in traffic: 1   TOTAL SCORE:   14 out of 24    SLEEP STUDIES:  PSG (03/18/20) AHI 37, 58% central, min SPO2 87%    CPAP COMPLIANCE DATA:  Date Range: 07/15/2023-01/10/2024  Average Daily Use: 5 hours 55 minutes  Median Use: 6 hours 45 minutes  Compliance for > 4 Hours: 74%  AHI: 9.3 respiratory events per hour  Days Used: 175/180 days  Mask Leak: 57.3  95th Percentile Pressure: 22.7/11.6         LABS: No results found for this or any previous visit (from the past 2160 hours).  Radiology: MM 3D SCREENING MAMMOGRAM BILATERAL BREAST Result Date: 12/24/2023 CLINICAL DATA:  Screening. EXAM: DIGITAL SCREENING BILATERAL MAMMOGRAM WITH TOMOSYNTHESIS AND CAD TECHNIQUE:  Bilateral screening digital craniocaudal and mediolateral oblique mammograms were obtained. Bilateral screening digital breast tomosynthesis was performed. The images were evaluated with computer-aided detection. COMPARISON:  Previous  exam(s). ACR Breast Density Category b: There are scattered areas of fibroglandular density. FINDINGS: There are no findings suspicious for malignancy. IMPRESSION: No mammographic evidence of malignancy. A result letter of this screening mammogram will be mailed directly to the patient. RECOMMENDATION: Screening mammogram in one year. (Code:SM-B-01Y) BI-RADS CATEGORY  1: Negative. Electronically Signed   By: Alm Parkins M.D.   On: 12/24/2023 13:28    No results found.  MM 3D SCREENING MAMMOGRAM BILATERAL BREAST Result Date: 12/24/2023 CLINICAL DATA:  Screening. EXAM: DIGITAL SCREENING BILATERAL MAMMOGRAM WITH TOMOSYNTHESIS AND CAD TECHNIQUE: Bilateral screening digital craniocaudal and mediolateral oblique mammograms were obtained. Bilateral screening digital breast tomosynthesis was performed. The images were evaluated with computer-aided detection. COMPARISON:  Previous exam(s). ACR Breast Density Category b: There are scattered areas of fibroglandular density. FINDINGS: There are no findings suspicious for malignancy. IMPRESSION: No mammographic evidence of malignancy. A result letter of this screening mammogram will be mailed directly to the patient. RECOMMENDATION: Screening mammogram in one year. (Code:SM-B-01Y) BI-RADS CATEGORY  1: Negative. Electronically Signed   By: Alm Parkins M.D.   On: 12/24/2023 13:28   CT SHOULDER RIGHT WO CONTRAST Result Date: 12/18/2023 CLINICAL DATA:  Evaluation of right proximal humeral fracture healing. EXAM: CT OF THE UPPER RIGHT EXTREMITY WITHOUT CONTRAST TECHNIQUE: Multidetector CT imaging of the upper right extremity was performed according to the standard protocol. RADIATION DOSE REDUCTION: This exam was performed according to the departmental dose-optimization program which includes automated exposure control, adjustment of the mA and/or kV according to patient size and/or use of iterative reconstruction technique. COMPARISON:  Right humerus and shoulder  radiographs dated 07/30/2023. FINDINGS: Bones/Joint/Cartilage Redemonstrated comminuted mildly displaced and impacted fracture of the right proximal humerus with fracture margins extending through the surgical neck and greater tuberosity. Alignment is unchanged. There is no evidence of marginal callus formation or osseous bridging. The proximal posteromedial cortex of the humeral shaft fracture component abuts the posterior glenoid rim. Mild glenoid rim osteophytosis. No dislocation. The acromioclavicular joint is anatomically aligned with mild joint space narrowing. Remote healed right lateral fourth through sixth rib fractures. Ligaments Ligaments are suboptimally evaluated by CT. Muscles and Tendons Muscles are normal. No muscle atrophy. No intramuscular fluid collection or hematoma. Soft tissue No fluid collection or hematoma. No soft tissue mass. No enlarged lymph nodes identified in the field of view. Visualized portions of the lungs are clear. IMPRESSION: 1. Comminuted mildly displaced and impacted fracture of the right proximal humerus with unchanged alignment. No evidence of marginal callus formation or osseous bridging. The proximal posteromedial cortex of the humeral shaft fracture component abuts the posterior glenoid rim. 2. Mild glenohumeral osteoarthritis. 3. Remote healed right lateral fourth through sixth rib fractures. Electronically Signed   By: Harrietta Sherry M.D.   On: 12/18/2023 14:27      Assessment and Plan: Patient Active Problem List   Diagnosis Date Noted   Closed fracture of proximal end of right humerus 08/11/2023   Chronic myeloid leukemia (CML), BCR/ABL-positive (HCC) 08/06/2023   Essential hypertension 04/04/2023   TIA (transient ischemic attack) 08/30/2022   Stroke (HCC) 07/06/2022   Complex sleep apnea syndrome 08/08/2021   Encounter for BiPAP use counseling 08/08/2021   Mass of upper inner quadrant of left breast 07/19/2021   Vaginal atrophy 07/19/2021   Family  history of breast cancer 07/19/2021  OSA on CPAP 04/19/2020   CPAP use counseling 04/19/2020   Nephrolithiasis 01/26/2017   Left ureteral stone 01/03/2017   Endometrial disorder 07/08/2015   Bilateral occipital neuralgia 03/02/2015   DDD (degenerative disc disease), lumbar 12/24/2014   Facet syndrome, lumbar 12/24/2014   Sacroiliac joint dysfunction 12/24/2014    1. Complex sleep apnea syndrome (Primary) The patient does tolerate PAP and reports  benefit from PAP use. Apneas are not controlled. Patient has had a recent change in her opiate therapy due to a broken arm which is likely playing a role. We will need to do a bipap asv titration. Patient is agreeable. The patient was reminded how to clean equipment and advised to replace supplies routinley. The patient was also counselled on weight loss. The compliance is fair. The AHI is 9.4, much  higher some nights. The patient had an echocardiogram last in April 2024 with a normal left ventricular ejection fraction.    Complex sleep apnea- not controlled. Bipap asv titration and f/u after setup.   2. Encounter for BiPAP use counseling PAP Counseling: had a lengthy discussion with the patient regarding the importance of PAP therapy in management of the sleep apnea. Patient appears to understand the risk factor reduction and also understands the risks associated with untreated sleep apnea. Patient will try to make a good faith effort to remain compliant with therapy. Also instructed the patient on proper cleaning of the device including the water must be changed daily if possible and use of distilled water is preferred. Patient understands that the machine should be regularly cleaned with appropriate recommended cleaning solutions that do not damage the PAP machine for example given white vinegar and water rinses. Other methods such as ozone treatment may not be as good as these simple methods to achieve cleaning.    3.  Chronic myeloid leukemia-  continue treatment as per oncologist.  General Counseling: I have discussed the findings of the evaluation and examination with Chevonne.  I have also discussed any further diagnostic evaluation thatmay be needed or ordered today. Theodore verbalizes understanding of the findings of todays visit. We also reviewed her medications today and discussed drug interactions and side effects including but not limited excessive drowsiness and altered mental states. We also discussed that there is always a risk not just to her but also people around her. she has been encouraged to call the office with any questions or concerns that should arise related to todays visit.  No orders of the defined types were placed in this encounter.       I have personally obtained a history, examined the patient, evaluated laboratory and imaging results, formulated the assessment and plan and placed orders. This patient was seen today by Lauraine Lay, PA-C in collaboration with Dr. Elfreda Bathe.   Elfreda DELENA Bathe, MD [facility] Diplomate ABMS Pulmonary Critical Care Medicine and Sleep Medicine

## 2024-01-14 ENCOUNTER — Ambulatory Visit (INDEPENDENT_AMBULATORY_CARE_PROVIDER_SITE_OTHER): Admitting: Internal Medicine

## 2024-01-14 ENCOUNTER — Encounter: Admitting: Physical Therapy

## 2024-01-14 VITALS — BP 143/84 | HR 113 | Resp 16 | Ht 59.0 in | Wt 184.0 lb

## 2024-01-14 DIAGNOSIS — G4739 Other sleep apnea: Secondary | ICD-10-CM

## 2024-01-14 DIAGNOSIS — C9210 Chronic myeloid leukemia, BCR/ABL-positive, not having achieved remission: Secondary | ICD-10-CM

## 2024-01-14 DIAGNOSIS — Z7189 Other specified counseling: Secondary | ICD-10-CM | POA: Diagnosis not present

## 2024-01-14 NOTE — Patient Instructions (Signed)

## 2024-01-17 ENCOUNTER — Encounter: Payer: Self-pay | Admitting: Physical Therapy

## 2024-01-17 ENCOUNTER — Ambulatory Visit: Attending: Orthopedic Surgery | Admitting: Physical Therapy

## 2024-01-17 DIAGNOSIS — R293 Abnormal posture: Secondary | ICD-10-CM | POA: Diagnosis present

## 2024-01-17 DIAGNOSIS — G8929 Other chronic pain: Secondary | ICD-10-CM | POA: Insufficient documentation

## 2024-01-17 DIAGNOSIS — M25511 Pain in right shoulder: Secondary | ICD-10-CM | POA: Insufficient documentation

## 2024-01-17 DIAGNOSIS — M6281 Muscle weakness (generalized): Secondary | ICD-10-CM | POA: Insufficient documentation

## 2024-01-17 DIAGNOSIS — M25611 Stiffness of right shoulder, not elsewhere classified: Secondary | ICD-10-CM | POA: Diagnosis present

## 2024-01-17 NOTE — Therapy (Signed)
 OUTPATIENT PHYSICAL THERAPY SHOULDER TREATMENT  Patient Name: Mariah Reeves MRN: [id number] DOB:02/07/1966, 58 y.o., female Today's Date: 01/17/2024  END OF SESSION:  PT End of Session - 01/17/24 1110     Visit Number 16    Number of Visits 24    Date for PT Re-Evaluation 01/22/24    PT Start Time 1136    PT Stop Time 1217    PT Time Calculation (min) 41 min    Behavior During Therapy WFL for tasks assessed/performed         Past Medical History:  Diagnosis Date   Allergy    Anomaly, uterus    tumor of uterus   Anxiety    Bilateral occipital neuralgia    Bipolar 1 disorder (HCC)    Chronic back pain    Complication of anesthesia    hard time waking up after gastric bypass.Pt stated that she has a small mouth and has had several surgeries since gastric bypass with no problems.   Degenerative disc disease, lumbar    Depression    Facet syndrome, lumbar    Family history of breast cancer    2/21 cancer genetic testing letter sent   GERD (gastroesophageal reflux disease)    before gastric bypass   Headache    Migraines   History of kidney stones    Left   Hx of vertigo    Patella fracture    Left   PTSD (post-traumatic stress disorder)    Sleep apnea    C-PAP   Wears glasses    Past Surgical History:  Procedure Laterality Date   ABDOMINOPLASTY  2006   CARPAL TUNNEL RELEASE Left    CHOLECYSTECTOMY     COLONOSCOPY W/ POLYPECTOMY     CYSTOSCOPY W/ URETERAL STENT PLACEMENT  01/03/2017   Procedure: CYSTOSCOPY WITH RETROGRADE PYELOGRAM/URETERAL STENT PLACEMENT;  Surgeon: Chauncey Redell Agent, MD;  Location: [facility];  Service: Urology;;   CYSTOSCOPY W/ URETERAL STENT PLACEMENT Left 01/17/2017   Procedure: CYSTOSCOPY WITH STENT REPLACEMENT;  Surgeon: Chauncey Redell Agent, MD;  Location: [facility];  Service: Urology;  Laterality: Left;   DILATATION & CURETTAGE/HYSTEROSCOPY WITH MYOSURE N/A 07/08/2015   Procedure: DILATATION & CURETTAGE/HYSTEROSCOPY WITH MYOSURE;  Surgeon:  Lamar SHAUNNA Lesches, MD;  Location: [facility];  Service: Gynecology;  Laterality: N/A;   DILATION AND CURETTAGE OF UTERUS     1/17   EXTRACORPOREAL SHOCK WAVE LITHOTRIPSY Left 12/07/2016   Procedure: EXTRACORPOREAL SHOCK WAVE LITHOTRIPSY (ESWL);  Surgeon: Chauncey Redell Agent, MD;  Location: [facility];  Service: Urology;  Laterality: Left;   GASTRIC BYPASS  2005   KNEE SURGERY Right    ACL- repair   LITHOTRIPSY     MIDDLE EAR SURGERY     NASAL SEPTUM SURGERY     OVARY SURGERY Right    ROTATOR CUFF REPAIR     URETEROSCOPY WITH HOLMIUM LASER LITHOTRIPSY     perforated ureter. had nephrostomy tube for brief time   URETEROSCOPY WITH HOLMIUM LASER LITHOTRIPSY Left 01/17/2017   Procedure: URETEROSCOPY WITH HOLMIUM LASER LITHOTRIPSY;  Surgeon: Chauncey Redell Agent, MD;  Location: [facility];  Service: Urology;  Laterality: Left;   Patient Active Problem List   Diagnosis Date Noted   Closed fracture of proximal end of right humerus 08/11/2023   Chronic myeloid leukemia (CML), BCR/ABL-positive (HCC) 08/06/2023   Essential hypertension 04/04/2023   TIA (transient ischemic attack) 08/30/2022   Stroke (HCC) 07/06/2022   Complex sleep apnea syndrome 08/08/2021   Encounter for  BiPAP use counseling 08/08/2021   Mass of upper inner quadrant of left breast 07/19/2021   Vaginal atrophy 07/19/2021   Family history of breast cancer 07/19/2021   OSA on CPAP 04/19/2020   CPAP use counseling 04/19/2020   Nephrolithiasis 01/26/2017   Left ureteral stone 01/03/2017   Endometrial disorder 07/08/2015   Bilateral occipital neuralgia 03/02/2015   DDD (degenerative disc disease), lumbar 12/24/2014   Facet syndrome, lumbar 12/24/2014   Sacroiliac joint dysfunction 12/24/2014   PCP: Derick Leita POUR, MD  REFERRING PROVIDER: Danella Donnice RIGGERS   REFERRING DIAG: D57.798I (ICD-10-CM) - Unspecified fracture of upper end of right humerus, subsequent encounter for fracture with routine healing  THERAPY DIAG:  Muscle  weakness (generalized)  Shoulder joint stiffness, right  Chronic right shoulder pain  Abnormal posture  Rationale for Evaluation and Treatment: Rehabilitation  ONSET DATE: 07/30/23  Fall resulting in R proximal humerus fx  SUBJECTIVE:                                                                                                                                                                                      SUBJECTIVE STATEMENT: Pt. Fell on 2/17 while bring the trash bin in resulting in R proximal humerus fracture.  Pt. States the last x-ray revealed fracture is healed.  Pt. Reports 8/10 R shoulder pain at worst and demonstrates minimal movement.  Pt. Sleeping on back with R shoulder off the pillow.  Pt. 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  Pt. Is taking Oxy for pain and remains guarded with R shoulder movement.  Pt. States she has been doing pendulum ex.   Hand dominance: Right  PERTINENT HISTORY: Pt. Well known to PT clinic.  See MD notes.   PAIN:  Are you having pain? Yes: NPRS scale: 8/10 Pain location: R proximal shoulder Pain description: aching/ sharp Aggravating factors: movement Relieving factors: meds/ rest  PRECAUTIONS: Shoulder  RED FLAGS: None   WEIGHT BEARING RESTRICTIONS: No  FALLS:  Has patient fallen in last 6 months? Yes. Number of falls 1 with injury  LIVING ENVIRONMENT: Lives with: lives with their family and lives alone Has following equipment at home: None  OCCUPATION: Editor, commissioning at [employer]  PLOF: Independent  PATIENT GOALS: Increase R shoulder ROM/ strength/ return to [employer]  NEXT MD VISIT:   OBJECTIVE:  Note: Objective measures were completed at Evaluation unless otherwise noted.  DIAGNOSTIC FINDINGS:  See imaging  PATIENT SURVEYS:  Quick Dash 90.9% significant limitations  COGNITION: Overall cognitive status: Within functional limits for tasks assessed     SENSATION: WFL  POSTURE: Forward  head/ rounded shoulder posture  UPPER EXTREMITY ROM:   Active ROM Right PROM eval Left AROM eval  Shoulder flexion 122 deg. 150 deg.  Shoulder extension    Shoulder abduction 81 deg. 150 deg.  Shoulder adduction    Shoulder internal rotation 74 deg. 85 deg.  Shoulder external rotation 18 deg. 85 deg.  Elbow flexion [facility] WFL  Elbow extension    Wrist flexion    Wrist extension    Wrist ulnar deviation    Wrist radial deviation    Wrist pronation    Wrist supination limited   (Blank rows = not tested)  No AROM in R shoulder flexion/ abduction.  R forearm limited supination as compared to L.  UPPER EXTREMITY MMT:  MMT Right eval Left eval  Shoulder flexion Unable to test R UE   Shoulder extension    Shoulder abduction    Shoulder adduction    Shoulder internal rotation    Shoulder external rotation    Middle trapezius    Lower trapezius    Elbow flexion    Elbow extension    Wrist flexion    Wrist extension    Wrist ulnar deviation    Wrist radial deviation    Wrist pronation    Wrist supination    Grip strength (lbs) 10.6# 19.8%  (Blank rows = not tested)  SHOULDER SPECIAL TESTS: Not able to test at this time  JOINT MOBILITY TESTING:  Guarded movement.  Significant crepitus  PALPATION:  R/L mid-bicep circumference: 39 cm/ 28.5 cm.   10.5 cm difference.  Tenderness  R shoulder AROM: supine (126 deg.), seated (108 deg.).   UPPER EXTREMITY ROM:   Active ROM Right AROM eval Left AROM eval  Shoulder flexion 109 deg. 142 deg.  Shoulder extension    Shoulder abduction 72 deg. 138 deg.  Shoulder adduction    Shoulder internal rotation 44 deg. 88 deg.  Shoulder external rotation 18 deg. 82 deg.  Elbow flexion [facility] WFL  Elbow extension    Wrist flexion    Wrist extension    Wrist ulnar deviation    Wrist radial deviation    Wrist pronation [facility] WFL  Wrist supination WFL WFL  (Blank rows = not tested)  UPPER EXTREMITY MMT:  MMT Right  Left    Shoulder flexion 3 4  Shoulder extension    Shoulder abduction 3 4-  Shoulder adduction    Shoulder internal rotation 3- 4+  Shoulder external rotation 3 4  Middle trapezius    Lower trapezius    Elbow flexion 4- 4  Elbow extension 3 4+  Wrist flexion 4+ 4+  Wrist extension 4+ 4+  Wrist ulnar deviation    Wrist radial deviation    Wrist pronation    Wrist supination    Grip strength (lbs) 28.3# 23.1#  (Blank rows = not tested) Lumbricals: R:5, L:5  QuickDASH: 63.64% (marked improvement)  UPPER EXTREMITY ROM:   Active ROM Right AROM eval Left AROM eval  Shoulder flexion 95 deg. 148 deg.  Shoulder extension    Shoulder abduction 67 deg. 154 deg.  Shoulder adduction    Shoulder internal rotation 54 deg. 100 deg.  Shoulder external rotation 35 deg. 83 deg.  Elbow flexion [facility]  Elbow extension    Wrist flexion    Wrist extension    Wrist ulnar deviation    Wrist radial deviation    Wrist pronation [facility] WFL  Wrist supination WFL WFL  (Blank rows = not tested)   UPPER EXTREMITY  MMT: NT due to unhealed fracture                                                                                                                            TREATMENT DATE: 01/17/2024  Subjective:  Pt. reports 4/10 R shoulder pain prior to tx. Session.  Pt. Will return to Dr. Marchia at end of August.    There.ex.:  Seated R shoulder AROM reassessment prior to start of tx.  No R sh. Crepitus noted today.  Discussed MD f/u at end of August.    B UBE 3 min. F/b. (Warm-up)- discussed shoulder pain symptoms/ daily tasks.   Standing wall ladder (flexion/ scaption)- 5x.   Pain tolerable range.  Minimal R sh. Crepitus noted.    Supine R shoulder manual isometrics: flexion/ abd/ ER/ IR 10x each with 5 sec hold.  Pt. Limited with ER muscle strength (modified position).    Supine R shoulder AAROM overhead flexion with weighted wand (chest press/ serratus punch)-  light PT assist.  Supine R  serratus punches 20x with good control.    Review of HEP  PATIENT EDUCATION: Education details: See HEP Person educated: Patient Education method: Explanation, Demonstration, and Handouts Education comprehension: verbalized understanding and returned demonstration  HOME EXERCISE PROGRAM: Access Code: [email or web address] URL: [email or web address] Date: 09/20/2023 Prepared by: Ozell Sero  Exercises - Seated Bicep Curls with Bar  - 2 x daily - 7 x weekly - 2 sets - 10 reps - Seated Forearm Pronation and Supination AROM  - 2 x daily - 7 x weekly - 2 sets - 10 reps - Seated Shoulder Flexion AAROM with Pulley Behind  - 2 x daily - 7 x weekly - 2 sets - 10 reps - Seated Shoulder Scaption AAROM with Pulley at Side  - 2 x daily - 7 x weekly - 2 sets - 10 reps - Seated Shoulder Abduction AAROM with Pulley Behind  - 2 x daily - 7 x weekly - 2 sets - 10 reps - Circular Shoulder Pendulum with Table Support  - 2 x daily - 7 x weekly - 2 sets - 10 reps  Access Code: [email or web address] URL: [email or web address] Date: 10/23/2023 Prepared by: Ozell Sero Exercises - Scapular Retraction with Resistance  - 1 x daily - 7 x weekly - 2 sets - 10 reps - Shoulder extension with resistance - Neutral  - 1 x daily - 7 x weekly - 2 sets - 10 reps - Shoulder External Rotation and Scapular Retraction with Resistance  - 1 x daily - 7 x weekly - 2 sets - 10 reps  ASSESSMENT:  CLINICAL IMPRESSION: No manual tx. Secondary unhealed fracture.  Pt. remains limited by R shoulder pain with progressive ROM and focus today on R shoulder stability within pain tolerable range. Pt tolerated treatment well, limited by pain and weakness, esp. With R sh. ER/IR.  Pt. Will continue with current HEP/ isometrics in a pain tolerable range.  Pt. Will  use R shoulder with everyday functional tasks/ ADL as tolerated.  Pt. will continue to benefit from skilled PT services to increase R shoulder ROM/ strength to improve  overhead reaching/ pain-free mobility.    OBJECTIVE IMPAIRMENTS: decreased activity tolerance, decreased mobility, decreased ROM, decreased strength, hypomobility, impaired flexibility, impaired UE functional use, improper body mechanics, postural dysfunction, and pain.   ACTIVITY LIMITATIONS: carrying, lifting, bathing, toileting, dressing, self feeding, reach over head, and hygiene/grooming  PARTICIPATION LIMITATIONS: cleaning, laundry, driving, shopping, community activity, and occupation  PERSONAL FACTORS: Fitness and Past/current experiences are also affecting patient's functional outcome.   REHAB POTENTIAL: Good  CLINICAL DECISION MAKING: Evolving/moderate complexity  EVALUATION COMPLEXITY: Moderate   GOALS: Goals reviewed with patient? Yes  LONG TERM GOALS: Target date: 01/22/24  Pt. Will decrease QuickDASH to <50% to improve UE functional mobIlity.   Baseline:  90.9%.  5/29: 73% (improvement noted).  7/1: 81.4%.  7/23: 63.64% Goal status: Partially met  2.  Pt. Will increase R shoulder flexion AROM to >120 deg. To reaching overhead/ manage hair.   Baseline:  no AROM at time of evaluation.  5/29: Supine R shoulder AROM 128 deg./ seated 108 deg. 7/23: R shoulder flexion AROM 95 deg seated  Goal status: Partially met  3.  Pt. Will present with 4/5 MMT in R UE to improve daily household/ work-related tasks.   Baseline:  see above. 7/23: unable to test due to unhealed fracture Goal status: Partially met  4.  Pt. Will report no R shoulder pain/limitations with daily tasks to improve return to PLOF.   Baseline:  R shoulder pain/ limited. 7/23: R shoulder pain/ limited  Goal status: Not met  PLAN:  PT FREQUENCY: 2x/week  PT DURATION: 6 weeks  PLANNED INTERVENTIONS: 97110-Therapeutic exercises, 97530- Therapeutic activity, 97112- Neuromuscular re-education, 97535- Self Care, [id number]- Manual therapy, [id number]- Electrical stimulation (unattended), Patient/Family education, Joint  mobilization, Cryotherapy, and Moist heat  PLAN FOR NEXT SESSION:  Progress HEP next tx. Session/ RECERT   Ozell JAYSON Sero, PT, DPT # [phone] 01/17/2024, 12:29 PM

## 2024-01-21 ENCOUNTER — Ambulatory Visit: Admitting: Physical Therapy

## 2024-01-21 ENCOUNTER — Encounter: Payer: Self-pay | Admitting: Physical Therapy

## 2024-01-21 DIAGNOSIS — G8929 Other chronic pain: Secondary | ICD-10-CM

## 2024-01-21 DIAGNOSIS — R293 Abnormal posture: Secondary | ICD-10-CM

## 2024-01-21 DIAGNOSIS — M6281 Muscle weakness (generalized): Secondary | ICD-10-CM

## 2024-01-21 DIAGNOSIS — M25611 Stiffness of right shoulder, not elsewhere classified: Secondary | ICD-10-CM

## 2024-01-21 NOTE — Therapy (Signed)
 OUTPATIENT PHYSICAL THERAPY SHOULDER TREATMENT  Patient Name: Mariah Reeves MRN: [id number] DOB:12-11-1965, 58 y.o., female Today's Date: 01/21/2024  END OF SESSION:  PT End of Session - 01/21/24 1036     Visit Number 17    Number of Visits 24    Date for PT Re-Evaluation 01/22/24    PT Start Time 1055    PT Stop Time 1135    PT Time Calculation (min) 40 min    Behavior During Therapy WFL for tasks assessed/performed         Past Medical History:  Diagnosis Date   Allergy    Anomaly, uterus    tumor of uterus   Anxiety    Bilateral occipital neuralgia    Bipolar 1 disorder (HCC)    Chronic back pain    Complication of anesthesia    hard time waking up after gastric bypass.Pt stated that she has a small mouth and has had several surgeries since gastric bypass with no problems.   Degenerative disc disease, lumbar    Depression    Facet syndrome, lumbar    Family history of breast cancer    2/21 cancer genetic testing letter sent   GERD (gastroesophageal reflux disease)    before gastric bypass   Headache    Migraines   History of kidney stones    Left   Hx of vertigo    Patella fracture    Left   PTSD (post-traumatic stress disorder)    Sleep apnea    C-PAP   Wears glasses    Past Surgical History:  Procedure Laterality Date   ABDOMINOPLASTY  2006   CARPAL TUNNEL RELEASE Left    CHOLECYSTECTOMY     COLONOSCOPY W/ POLYPECTOMY     CYSTOSCOPY W/ URETERAL STENT PLACEMENT  01/03/2017   Procedure: CYSTOSCOPY WITH RETROGRADE PYELOGRAM/URETERAL STENT PLACEMENT;  Surgeon: Chauncey Redell Agent, MD;  Location: [facility];  Service: Urology;;   CYSTOSCOPY W/ URETERAL STENT PLACEMENT Left 01/17/2017   Procedure: CYSTOSCOPY WITH STENT REPLACEMENT;  Surgeon: Chauncey Redell Agent, MD;  Location: [facility];  Service: Urology;  Laterality: Left;   DILATATION & CURETTAGE/HYSTEROSCOPY WITH MYOSURE N/A 07/08/2015   Procedure: DILATATION & CURETTAGE/HYSTEROSCOPY WITH MYOSURE;  Surgeon:  Lamar SHAUNNA Lesches, MD;  Location: [facility];  Service: Gynecology;  Laterality: N/A;   DILATION AND CURETTAGE OF UTERUS     1/17   EXTRACORPOREAL SHOCK WAVE LITHOTRIPSY Left 12/07/2016   Procedure: EXTRACORPOREAL SHOCK WAVE LITHOTRIPSY (ESWL);  Surgeon: Chauncey Redell Agent, MD;  Location: [facility];  Service: Urology;  Laterality: Left;   GASTRIC BYPASS  2005   KNEE SURGERY Right    ACL- repair   LITHOTRIPSY     MIDDLE EAR SURGERY     NASAL SEPTUM SURGERY     OVARY SURGERY Right    ROTATOR CUFF REPAIR     URETEROSCOPY WITH HOLMIUM LASER LITHOTRIPSY     perforated ureter. had nephrostomy tube for brief time   URETEROSCOPY WITH HOLMIUM LASER LITHOTRIPSY Left 01/17/2017   Procedure: URETEROSCOPY WITH HOLMIUM LASER LITHOTRIPSY;  Surgeon: Chauncey Redell Agent, MD;  Location: [facility];  Service: Urology;  Laterality: Left;   Patient Active Problem List   Diagnosis Date Noted   Closed fracture of proximal end of right humerus 08/11/2023   Chronic myeloid leukemia (CML), BCR/ABL-positive (HCC) 08/06/2023   Essential hypertension 04/04/2023   TIA (transient ischemic attack) 08/30/2022   Stroke (HCC) 07/06/2022   Complex sleep apnea syndrome 08/08/2021   Encounter for  BiPAP use counseling 08/08/2021   Mass of upper inner quadrant of left breast 07/19/2021   Vaginal atrophy 07/19/2021   Family history of breast cancer 07/19/2021   OSA on CPAP 04/19/2020   CPAP use counseling 04/19/2020   Nephrolithiasis 01/26/2017   Left ureteral stone 01/03/2017   Endometrial disorder 07/08/2015   Bilateral occipital neuralgia 03/02/2015   DDD (degenerative disc disease), lumbar 12/24/2014   Facet syndrome, lumbar 12/24/2014   Sacroiliac joint dysfunction 12/24/2014   PCP: Derick Leita POUR, MD  REFERRING PROVIDER: Danella Donnice RIGGERS   REFERRING DIAG: D57.798I (ICD-10-CM) - Unspecified fracture of upper end of right humerus, subsequent encounter for fracture with routine healing  THERAPY DIAG:  Muscle  weakness (generalized)  Shoulder joint stiffness, right  Chronic right shoulder pain  Abnormal posture  Rationale for Evaluation and Treatment: Rehabilitation  ONSET DATE: 07/30/23  Fall resulting in R proximal humerus fx  SUBJECTIVE:                                                                                                                                                                                      SUBJECTIVE STATEMENT: Pt. Fell on 2/17 while bring the trash bin in resulting in R proximal humerus fracture.  Pt. States the last x-ray revealed fracture is healed.  Pt. Reports 8/10 R shoulder pain at worst and demonstrates minimal movement.  Pt. Sleeping on back with R shoulder off the pillow.  Pt. 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  Pt. Is taking Oxy for pain and remains guarded with R shoulder movement.  Pt. States she has been doing pendulum ex.   Hand dominance: Right  PERTINENT HISTORY: Pt. Well known to PT clinic.  See MD notes.   PAIN:  Are you having pain? Yes: NPRS scale: 8/10 Pain location: R proximal shoulder Pain description: aching/ sharp Aggravating factors: movement Relieving factors: meds/ rest  PRECAUTIONS: Shoulder  RED FLAGS: None   WEIGHT BEARING RESTRICTIONS: No  FALLS:  Has patient fallen in last 6 months? Yes. Number of falls 1 with injury  LIVING ENVIRONMENT: Lives with: lives with their family and lives alone Has following equipment at home: None  OCCUPATION: Editor, commissioning at [employer]  PLOF: Independent  PATIENT GOALS: Increase R shoulder ROM/ strength/ return to [employer]  NEXT MD VISIT:   OBJECTIVE:  Note: Objective measures were completed at Evaluation unless otherwise noted.  DIAGNOSTIC FINDINGS:  See imaging  PATIENT SURVEYS:  Quick Dash 90.9% significant limitations  COGNITION: Overall cognitive status: Within functional limits for tasks assessed     SENSATION: WFL  POSTURE: Forward  head/ rounded shoulder posture  UPPER EXTREMITY ROM:   Active ROM Right PROM eval Left AROM eval  Shoulder flexion 122 deg. 150 deg.  Shoulder extension    Shoulder abduction 81 deg. 150 deg.  Shoulder adduction    Shoulder internal rotation 74 deg. 85 deg.  Shoulder external rotation 18 deg. 85 deg.  Elbow flexion [facility] WFL  Elbow extension    Wrist flexion    Wrist extension    Wrist ulnar deviation    Wrist radial deviation    Wrist pronation    Wrist supination limited   (Blank rows = not tested)  No AROM in R shoulder flexion/ abduction.  R forearm limited supination as compared to L.  UPPER EXTREMITY MMT:  MMT Right eval Left eval  Shoulder flexion Unable to test R UE   Shoulder extension    Shoulder abduction    Shoulder adduction    Shoulder internal rotation    Shoulder external rotation    Middle trapezius    Lower trapezius    Elbow flexion    Elbow extension    Wrist flexion    Wrist extension    Wrist ulnar deviation    Wrist radial deviation    Wrist pronation    Wrist supination    Grip strength (lbs) 10.6# 19.8%  (Blank rows = not tested)  SHOULDER SPECIAL TESTS: Not able to test at this time  JOINT MOBILITY TESTING:  Guarded movement.  Significant crepitus  PALPATION:  R/L mid-bicep circumference: 39 cm/ 28.5 cm.   10.5 cm difference.  Tenderness  R shoulder AROM: supine (126 deg.), seated (108 deg.).   UPPER EXTREMITY ROM:   Active ROM Right AROM eval Left AROM eval  Shoulder flexion 109 deg. 142 deg.  Shoulder extension    Shoulder abduction 72 deg. 138 deg.  Shoulder adduction    Shoulder internal rotation 44 deg. 88 deg.  Shoulder external rotation 18 deg. 82 deg.  Elbow flexion [facility] WFL  Elbow extension    Wrist flexion    Wrist extension    Wrist ulnar deviation    Wrist radial deviation    Wrist pronation [facility] WFL  Wrist supination WFL WFL  (Blank rows = not tested)  UPPER EXTREMITY MMT:  MMT Right  Left    Shoulder flexion 3 4  Shoulder extension    Shoulder abduction 3 4-  Shoulder adduction    Shoulder internal rotation 3- 4+  Shoulder external rotation 3 4  Middle trapezius    Lower trapezius    Elbow flexion 4- 4  Elbow extension 3 4+  Wrist flexion 4+ 4+  Wrist extension 4+ 4+  Wrist ulnar deviation    Wrist radial deviation    Wrist pronation    Wrist supination    Grip strength (lbs) 28.3# 23.1#  (Blank rows = not tested) Lumbricals: R:5, L:5  QuickDASH: 63.64% (marked improvement)  UPPER EXTREMITY ROM:   Active ROM Right AROM eval Left AROM eval  Shoulder flexion 95 deg. 148 deg.  Shoulder extension    Shoulder abduction 67 deg. 154 deg.  Shoulder adduction    Shoulder internal rotation 54 deg. 100 deg.  Shoulder external rotation 35 deg. 83 deg.  Elbow flexion [facility]  Elbow extension    Wrist flexion    Wrist extension    Wrist ulnar deviation    Wrist radial deviation    Wrist pronation [facility] WFL  Wrist supination WFL WFL  (Blank rows = not tested)   UPPER EXTREMITY  MMT: NT due to unhealed fracture                                                                                                                            TREATMENT DATE: 01/21/2024  Subjective:  Pt. reports 4/10 R shoulder pain prior to tx. Session.  Pt. Using R UE with daily tasks and scheduled to return to work this Friday. Pt. Will return to Dr. Marchia at end of August.    There.act.:  Standing/ seated R shoulder AROM reassessment prior to start of tx.  No R sh. Crepitus noted today.  Discussed weekend activities.    Supine R shoulder manual isometrics: flexion/ abd/ ER/ IR 10x each with 5 sec hold.  Pt. Limited with ER muscle strength (modified position).    Supine R shoulder rhythmic stabs (light resistance)- all planes.    Supine R serratus punches 20x with good control.    Standing wall push-ups/ serratus punches with ball at wall (CW/CCW).    B UBE 3 min. forward/ 2 min.  backwards (Warm-up)- discussed shoulder pain symptoms/ daily tasks.  Increase sh. Discomfort at 5 minute mark.     Review of HEP  PATIENT EDUCATION: Education details: See HEP Person educated: Patient Education method: Explanation, Demonstration, and Handouts Education comprehension: verbalized understanding and returned demonstration  HOME EXERCISE PROGRAM: Access Code: [email or web address] URL: [email or web address] Date: 09/20/2023 Prepared by: Ozell Sero  Exercises - Seated Bicep Curls with Bar  - 2 x daily - 7 x weekly - 2 sets - 10 reps - Seated Forearm Pronation and Supination AROM  - 2 x daily - 7 x weekly - 2 sets - 10 reps - Seated Shoulder Flexion AAROM with Pulley Behind  - 2 x daily - 7 x weekly - 2 sets - 10 reps - Seated Shoulder Scaption AAROM with Pulley at Side  - 2 x daily - 7 x weekly - 2 sets - 10 reps - Seated Shoulder Abduction AAROM with Pulley Behind  - 2 x daily - 7 x weekly - 2 sets - 10 reps - Circular Shoulder Pendulum with Table Support  - 2 x daily - 7 x weekly - 2 sets - 10 reps  Access Code: [email or web address] URL: [email or web address] Date: 10/23/2023 Prepared by: Ozell Sero Exercises - Scapular Retraction with Resistance  - 1 x daily - 7 x weekly - 2 sets - 10 reps - Shoulder extension with resistance - Neutral  - 1 x daily - 7 x weekly - 2 sets - 10 reps - Shoulder External Rotation and Scapular Retraction with Resistance  - 1 x daily - 7 x weekly - 2 sets - 10 reps  ASSESSMENT:  CLINICAL IMPRESSION: Pt. remains limited by R shoulder pain with progressive ROM and focus today on R shoulder stability within pain tolerable range. Pt tolerated treatment well, limited by pain and weakness, esp. With R sh. ER/IR isometrics.  Min. To no R sh. Crepitus with ROM/ isometrics.  Pt. Will use R shoulder with everyday functional tasks/ ADL as tolerated.  Pt. will continue to benefit from skilled PT services to increase R shoulder ROM/ strength  to improve overhead reaching/ pain-free mobility.    OBJECTIVE IMPAIRMENTS: decreased activity tolerance, decreased mobility, decreased ROM, decreased strength, hypomobility, impaired flexibility, impaired UE functional use, improper body mechanics, postural dysfunction, and pain.   ACTIVITY LIMITATIONS: carrying, lifting, bathing, toileting, dressing, self feeding, reach over head, and hygiene/grooming  PARTICIPATION LIMITATIONS: cleaning, laundry, driving, shopping, community activity, and occupation  PERSONAL FACTORS: Fitness and Past/current experiences are also affecting patient's functional outcome.   REHAB POTENTIAL: Good  CLINICAL DECISION MAKING: Evolving/moderate complexity  EVALUATION COMPLEXITY: Moderate   GOALS: Goals reviewed with patient? Yes  LONG TERM GOALS: Target date: 01/22/24  Pt. Will decrease QuickDASH to <50% to improve UE functional mobIlity.   Baseline:  90.9%.  5/29: 73% (improvement noted).  7/1: 81.4%.  7/23: 63.64% Goal status: Partially met  2.  Pt. Will increase R shoulder flexion AROM to >120 deg. To reaching overhead/ manage hair.   Baseline:  no AROM at time of evaluation.  5/29: Supine R shoulder AROM 128 deg./ seated 108 deg. 7/23: R shoulder flexion AROM 95 deg seated  Goal status: Partially met  3.  Pt. Will present with 4/5 MMT in R UE to improve daily household/ work-related tasks.   Baseline:  see above. 7/23: unable to test due to unhealed fracture Goal status: Partially met  4.  Pt. Will report no R shoulder pain/limitations with daily tasks to improve return to PLOF.   Baseline:  R shoulder pain/ limited. 7/23: R shoulder pain/ limited  Goal status: Not met  PLAN:  PT FREQUENCY: 2x/week  PT DURATION: 6 weeks  PLANNED INTERVENTIONS: 97110-Therapeutic exercises, 97530- Therapeutic activity, 97112- Neuromuscular re-education, 97535- Self Care, [id number]- Manual therapy, [id number]- Electrical stimulation (unattended), Patient/Family  education, Joint mobilization, Cryotherapy, and Moist heat  PLAN FOR NEXT SESSION:  Progress HEP next tx. Session/ RECERT   Ozell JAYSON Sero, PT, DPT # [phone] 01/21/2024, 2:12 PM

## 2024-01-23 ENCOUNTER — Ambulatory Visit: Admitting: Physical Therapy

## 2024-01-23 DIAGNOSIS — M6281 Muscle weakness (generalized): Secondary | ICD-10-CM

## 2024-01-23 DIAGNOSIS — R293 Abnormal posture: Secondary | ICD-10-CM

## 2024-01-23 DIAGNOSIS — M25611 Stiffness of right shoulder, not elsewhere classified: Secondary | ICD-10-CM

## 2024-01-23 DIAGNOSIS — G8929 Other chronic pain: Secondary | ICD-10-CM

## 2024-01-23 NOTE — Therapy (Signed)
 OUTPATIENT PHYSICAL THERAPY SHOULDER TREATMENT/ RECERTIFICATION  Patient Name: Mariah Reeves MRN: [id number] DOB:[birth year], 58 y.o., female Today's Date: 01/23/2024  END OF SESSION:  PT End of Session - 01/23/24 1047     Visit Number 18    Number of Visits 30    Date for PT Re-Evaluation 03/05/24    PT Start Time 1045    PT Stop Time 1126    PT Time Calculation (min) 41 min         Past Medical History:  Diagnosis Date   Allergy    Anomaly, uterus    tumor of uterus   Anxiety    Bilateral occipital neuralgia    Bipolar 1 disorder (HCC)    Chronic back pain    Complication of anesthesia    hard time waking up after gastric bypass.Pt stated that she has a small mouth and has had several surgeries since gastric bypass with no problems.   Degenerative disc disease, lumbar    Depression    Facet syndrome, lumbar    Family history of breast cancer    2/21 cancer genetic testing letter sent   GERD (gastroesophageal reflux disease)    before gastric bypass   Headache    Migraines   History of kidney stones    Left   Hx of vertigo    Patella fracture    Left   PTSD (post-traumatic stress disorder)    Sleep apnea    C-PAP   Wears glasses    Past Surgical History:  Procedure Laterality Date   ABDOMINOPLASTY  2006   CARPAL TUNNEL RELEASE Left    CHOLECYSTECTOMY     COLONOSCOPY W/ POLYPECTOMY     CYSTOSCOPY W/ URETERAL STENT PLACEMENT  01/03/2017   Procedure: CYSTOSCOPY WITH RETROGRADE PYELOGRAM/URETERAL STENT PLACEMENT;  Surgeon: Chauncey Redell Agent, MD;  Location: [facility];  Service: Urology;;   CYSTOSCOPY W/ URETERAL STENT PLACEMENT Left 01/17/2017   Procedure: CYSTOSCOPY WITH STENT REPLACEMENT;  Surgeon: Chauncey Redell Agent, MD;  Location: [facility];  Service: Urology;  Laterality: Left;   DILATATION & CURETTAGE/HYSTEROSCOPY WITH MYOSURE N/A 07/08/2015   Procedure: DILATATION & CURETTAGE/HYSTEROSCOPY WITH MYOSURE;  Surgeon: Lamar SHAUNNA Lesches, MD;  Location: [facility];   Service: Gynecology;  Laterality: N/A;   DILATION AND CURETTAGE OF UTERUS     1/17   EXTRACORPOREAL SHOCK WAVE LITHOTRIPSY Left 12/07/2016   Procedure: EXTRACORPOREAL SHOCK WAVE LITHOTRIPSY (ESWL);  Surgeon: Chauncey Redell Agent, MD;  Location: [facility];  Service: Urology;  Laterality: Left;   GASTRIC BYPASS  2005   KNEE SURGERY Right    ACL- repair   LITHOTRIPSY     MIDDLE EAR SURGERY     NASAL SEPTUM SURGERY     OVARY SURGERY Right    ROTATOR CUFF REPAIR     URETEROSCOPY WITH HOLMIUM LASER LITHOTRIPSY     perforated ureter. had nephrostomy tube for brief time   URETEROSCOPY WITH HOLMIUM LASER LITHOTRIPSY Left 01/17/2017   Procedure: URETEROSCOPY WITH HOLMIUM LASER LITHOTRIPSY;  Surgeon: Chauncey Redell Agent, MD;  Location: [facility];  Service: Urology;  Laterality: Left;   Patient Active Problem List   Diagnosis Date Noted   Closed fracture of proximal end of right humerus 08/11/2023   Chronic myeloid leukemia (CML), BCR/ABL-positive (HCC) 08/06/2023   Essential hypertension 04/04/2023   TIA (transient ischemic attack) 08/30/2022   Stroke (HCC) 07/06/2022   Complex sleep apnea syndrome 08/08/2021   Encounter for BiPAP use counseling 08/08/2021   Mass of upper  inner quadrant of left breast 07/19/2021   Vaginal atrophy 07/19/2021   Family history of breast cancer 07/19/2021   OSA on CPAP 04/19/2020   CPAP use counseling 04/19/2020   Nephrolithiasis 01/26/2017   Left ureteral stone 01/03/2017   Endometrial disorder 07/08/2015   Bilateral occipital neuralgia 03/02/2015   DDD (degenerative disc disease), lumbar 12/24/2014   Facet syndrome, lumbar 12/24/2014   Sacroiliac joint dysfunction 12/24/2014   PCP: Derick Leita POUR, MD  REFERRING PROVIDER: Danella Donnice RIGGERS   REFERRING DIAG: D57.798I (ICD-10-CM) - Unspecified fracture of upper end of right humerus, subsequent encounter for fracture with routine healing  THERAPY DIAG:  Muscle weakness (generalized)  Shoulder joint  stiffness, right  Chronic right shoulder pain  Abnormal posture  Rationale for Evaluation and Treatment: Rehabilitation  ONSET DATE: 07/30/23  Fall resulting in R proximal humerus fx  SUBJECTIVE:                                                                                                                                                                                      SUBJECTIVE STATEMENT: Pt. Fell on 2/17 while bring the trash bin in resulting in R proximal humerus fracture.  Pt. States the last x-ray revealed fracture is healed.  Pt. Reports 8/10 R shoulder pain at worst and demonstrates minimal movement.  Pt. Sleeping on back with R shoulder off the pillow.  Pt. 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  Pt. Is taking Oxy for pain and remains guarded with R shoulder movement.  Pt. States she has been doing pendulum ex.   Hand dominance: Right  PERTINENT HISTORY: Pt. Well known to PT clinic.  See MD notes.   PAIN:  Are you having pain? Yes: NPRS scale: 8/10 Pain location: R proximal shoulder Pain description: aching/ sharp Aggravating factors: movement Relieving factors: meds/ rest  PRECAUTIONS: Shoulder  RED FLAGS: None   WEIGHT BEARING RESTRICTIONS: No  FALLS:  Has patient fallen in last 6 months? Yes. Number of falls 1 with injury  LIVING ENVIRONMENT: Lives with: lives with their family and lives alone Has following equipment at home: None  OCCUPATION: Editor, commissioning at [employer]  PLOF: Independent  PATIENT GOALS: Increase R shoulder ROM/ strength/ return to [employer]  NEXT MD VISIT:   OBJECTIVE:  Note: Objective measures were completed at Evaluation unless otherwise noted.  DIAGNOSTIC FINDINGS:  See imaging  PATIENT SURVEYS:  Quick Dash 90.9% significant limitations  COGNITION: Overall cognitive status: Within functional limits for tasks assessed     SENSATION: WFL  POSTURE: Forward head/ rounded shoulder posture  UPPER  EXTREMITY ROM:   Active ROM Right PROM  eval Left AROM eval  Shoulder flexion 122 deg. 150 deg.  Shoulder extension    Shoulder abduction 81 deg. 150 deg.  Shoulder adduction    Shoulder internal rotation 74 deg. 85 deg.  Shoulder external rotation 18 deg. 85 deg.  Elbow flexion [facility] WFL  Elbow extension    Wrist flexion    Wrist extension    Wrist ulnar deviation    Wrist radial deviation    Wrist pronation    Wrist supination limited   (Blank rows = not tested)  No AROM in R shoulder flexion/ abduction.  R forearm limited supination as compared to L.  UPPER EXTREMITY MMT:  MMT Right eval Left eval  Shoulder flexion Unable to test R UE   Shoulder extension    Shoulder abduction    Shoulder adduction    Shoulder internal rotation    Shoulder external rotation    Middle trapezius    Lower trapezius    Elbow flexion    Elbow extension    Wrist flexion    Wrist extension    Wrist ulnar deviation    Wrist radial deviation    Wrist pronation    Wrist supination    Grip strength (lbs) 10.6# 19.8%  (Blank rows = not tested)  SHOULDER SPECIAL TESTS: Not able to test at this time  JOINT MOBILITY TESTING:  Guarded movement.  Significant crepitus  PALPATION:  R/L mid-bicep circumference: 39 cm/ 28.5 cm.   10.5 cm difference.  Tenderness  R shoulder AROM: supine (126 deg.), seated (108 deg.).   UPPER EXTREMITY ROM:   Active ROM Right AROM eval Left AROM eval  Shoulder flexion 109 deg. 142 deg.  Shoulder extension    Shoulder abduction 72 deg. 138 deg.  Shoulder adduction    Shoulder internal rotation 44 deg. 88 deg.  Shoulder external rotation 18 deg. 82 deg.  Elbow flexion [facility] WFL  Elbow extension    Wrist flexion    Wrist extension    Wrist ulnar deviation    Wrist radial deviation    Wrist pronation [facility] WFL  Wrist supination WFL WFL  (Blank rows = not tested)  UPPER EXTREMITY MMT:  MMT Right  Left   Shoulder flexion 3 4  Shoulder extension     Shoulder abduction 3 4-  Shoulder adduction    Shoulder internal rotation 3- 4+  Shoulder external rotation 3 4  Middle trapezius    Lower trapezius    Elbow flexion 4- 4  Elbow extension 3 4+  Wrist flexion 4+ 4+  Wrist extension 4+ 4+  Wrist ulnar deviation    Wrist radial deviation    Wrist pronation    Wrist supination    Grip strength (lbs) 28.3# 23.1#  (Blank rows = not tested) Lumbricals: R:5, L:5  QuickDASH: 63.64% (marked improvement)  UPPER EXTREMITY ROM:   Active ROM Right AROM eval Left AROM eval  Shoulder flexion 95 deg. 148 deg.  Shoulder extension    Shoulder abduction 67 deg. 154 deg.  Shoulder adduction    Shoulder internal rotation 54 deg. 100 deg.  Shoulder external rotation 35 deg. 83 deg.  Elbow flexion [facility] WFL  Elbow extension    Wrist flexion    Wrist extension    Wrist ulnar deviation    Wrist radial deviation    Wrist pronation [facility] WFL  Wrist supination WFL WFL  (Blank rows = not tested)   UPPER EXTREMITY MMT: NT due to unhealed fracture  TREATMENT DATE: 01/23/2024  Subjective:  Pt. reports 5/10 R shoulder pain at the proximal humerus and anterior shoulder region prior to tx. Session. Pt. Will return to Dr. Marchia on Thursday August 21st.  UPPER EXTREMITY ROM:   Active ROM Right AROM eval   Shoulder flexion 97 deg.   Shoulder extension    Shoulder abduction 76 deg.   Shoulder adduction    Shoulder internal rotation 29 deg.   Shoulder external rotation 33 deg.   Elbow flexion WFL   Elbow extension    Wrist flexion    Wrist extension    Wrist ulnar deviation    Wrist radial deviation    Wrist pronation WFL   Wrist supination WFL   (Blank rows = not tested)  MMT: R/L bicep (4-/4-), triceps (4-/4-),Grip strength: R 21.5#.  L 16.2#.    Slight decrease in R shoulder AROM noted today as compared to  previous reassessment.    There.act.:   Standing/supine R shoulder AAROM with dowel into flexion, abduction, ER  Supine R shoulder manual isometrics: flexion/extension 10x each with 5 sec hold.  Seated R shoulder manual isometrics: ER/IR 10x each with 5 sec hold  Supine R serratus punches 20x with good control.    Standing wall push-ups/ serratus punches with ball at wall (CW/CCW).    B UBE 3 min. forward/ 3 min. backwards (Warm-up)- discussed shoulder pain symptoms/ daily tasks.    Discussed trying quadruped position for UE wt. Bearing but pt. Concerned about knees/ tailbone.    Review of HEP  PATIENT EDUCATION: Education details: See HEP Person educated: Patient Education method: Explanation, Demonstration, and Handouts Education comprehension: verbalized understanding and returned demonstration  HOME EXERCISE PROGRAM: Access Code: [email or web address] URL: [email or web address] Date: 09/20/2023 Prepared by: Ozell Sero  Exercises - Seated Bicep Curls with Bar  - 2 x daily - 7 x weekly - 2 sets - 10 reps - Seated Forearm Pronation and Supination AROM  - 2 x daily - 7 x weekly - 2 sets - 10 reps - Seated Shoulder Flexion AAROM with Pulley Behind  - 2 x daily - 7 x weekly - 2 sets - 10 reps - Seated Shoulder Scaption AAROM with Pulley at Side  - 2 x daily - 7 x weekly - 2 sets - 10 reps - Seated Shoulder Abduction AAROM with Pulley Behind  - 2 x daily - 7 x weekly - 2 sets - 10 reps - Circular Shoulder Pendulum with Table Support  - 2 x daily - 7 x weekly - 2 sets - 10 reps  Access Code: [email or web address] URL: [email or web address] Date: 10/23/2023 Prepared by: Ozell Sero Exercises - Scapular Retraction with Resistance  - 1 x daily - 7 x weekly - 2 sets - 10 reps - Shoulder extension with resistance - Neutral  - 1 x daily - 7 x weekly - 2 sets - 10 reps - Shoulder External Rotation and Scapular Retraction with Resistance  - 1 x daily - 7 x weekly - 2 sets -  10 reps  ASSESSMENT:  CLINICAL IMPRESSION: Pt. remains limited by R shoulder pain and fatigue during shoulder isometric strengthening exercises, particularly with supine shoulder flexion/extension and seated IR. Attempted to initiate activities in quadruped position on this day to promote weight bearing through right UE but pt. unwilling to attempt due to potential flair up of knee and tailbone issues.  Pt. With improved ability to complete wall pushups on this day through a greater ROM and with equal distribution through  bilateral Ues. Pt. Continues to be limited with right shoulder IR, flexion, and abduction AROM. Pt. Further limited by strength deficits in right UE which continues to affect her daily function. Pt. will continue to benefit from skilled PT services to increase R shoulder ROM/ strength to improve overhead reaching/ pain-free mobility in order to complete daily tasks.   OBJECTIVE IMPAIRMENTS: decreased activity tolerance, decreased mobility, decreased ROM, decreased strength, hypomobility, impaired flexibility, impaired UE functional use, improper body mechanics, postural dysfunction, and pain.   ACTIVITY LIMITATIONS: carrying, lifting, bathing, toileting, dressing, self feeding, reach over head, and hygiene/grooming  PARTICIPATION LIMITATIONS: cleaning, laundry, driving, shopping, community activity, and occupation  PERSONAL FACTORS: Fitness and Past/current experiences are also affecting patient's functional outcome.   REHAB POTENTIAL: Good  CLINICAL DECISION MAKING: Evolving/moderate complexity  EVALUATION COMPLEXITY: Moderate   GOALS: Goals reviewed with patient? Yes  LONG TERM GOALS: Target date: 03/05/24  Pt. Will decrease QuickDASH to <50% to improve UE functional mobIlity.   Baseline:  90.9%.  5/29: 73% (improvement noted).  7/1: 81.4%.  7/23: 63.64% Goal status: Partially met  2.  Pt. Will increase R shoulder flexion AROM to >120 deg. To reaching overhead/  manage hair.   Baseline:  no AROM at time of evaluation.  5/29: Supine R shoulder AROM 128 deg./ seated 108 deg. 7/23: R shoulder flexion AROM 95 deg seated.  8/13: see above Goal status: Partially met  3.  Pt. Will present with 4/5 MMT in R UE to improve daily household/ work-related tasks.   Baseline:  see above. 7/23: unable to test due to unhealed fracture.   Goal status: Partially met  4.  Pt. Will report no R shoulder pain/limitations with daily tasks to improve return to PLOF.   Baseline:  R shoulder pain/ limited. 7/23: R shoulder pain/ limited  Goal status: Not met  PLAN:  PT FREQUENCY: 2x/week  PT DURATION: 6 weeks  PLANNED INTERVENTIONS: 97110-Therapeutic exercises, 97530- Therapeutic activity, 97112- Neuromuscular re-education, 97535- Self Care, [id number]- Manual therapy, [id number]- Electrical stimulation (unattended), Patient/Family education, Joint mobilization, Cryotherapy, and Moist heat  PLAN FOR NEXT SESSION:  Progress UE wt. Bearing ex.    Curtistine Bracket, SPT  Ozell JAYSON Sero, PT, DPT # [phone] 01/23/2024, 2:46 PM

## 2024-01-29 ENCOUNTER — Ambulatory Visit: Admitting: Physical Therapy

## 2024-01-29 ENCOUNTER — Encounter: Payer: Self-pay | Admitting: Physical Therapy

## 2024-01-29 DIAGNOSIS — M6281 Muscle weakness (generalized): Secondary | ICD-10-CM

## 2024-01-29 DIAGNOSIS — R293 Abnormal posture: Secondary | ICD-10-CM

## 2024-01-29 DIAGNOSIS — M25611 Stiffness of right shoulder, not elsewhere classified: Secondary | ICD-10-CM

## 2024-01-29 DIAGNOSIS — G8929 Other chronic pain: Secondary | ICD-10-CM

## 2024-01-29 NOTE — Therapy (Signed)
 OUTPATIENT PHYSICAL THERAPY SHOULDER TREATMENT  Patient Name: Mariah Reeves MRN: [id number] DOB:09-29-1965, 58 y.o., female Today's Date: 01/29/2024  END OF SESSION:  PT End of Session - 01/29/24 1355     Visit Number 19    Number of Visits 30    Date for PT Re-Evaluation 03/05/24    PT Start Time 1346         Past Medical History:  Diagnosis Date   Allergy    Anomaly, uterus    tumor of uterus   Anxiety    Bilateral occipital neuralgia    Bipolar 1 disorder (HCC)    Chronic back pain    Complication of anesthesia    hard time waking up after gastric bypass.Pt stated that she has a small mouth and has had several surgeries since gastric bypass with no problems.   Degenerative disc disease, lumbar    Depression    Facet syndrome, lumbar    Family history of breast cancer    2/21 cancer genetic testing letter sent   GERD (gastroesophageal reflux disease)    before gastric bypass   Headache    Migraines   History of kidney stones    Left   Hx of vertigo    Patella fracture    Left   PTSD (post-traumatic stress disorder)    Sleep apnea    C-PAP   Wears glasses    Past Surgical History:  Procedure Laterality Date   ABDOMINOPLASTY  2006   CARPAL TUNNEL RELEASE Left    CHOLECYSTECTOMY     COLONOSCOPY W/ POLYPECTOMY     CYSTOSCOPY W/ URETERAL STENT PLACEMENT  01/03/2017   Procedure: CYSTOSCOPY WITH RETROGRADE PYELOGRAM/URETERAL STENT PLACEMENT;  Surgeon: Chauncey Redell Agent, MD;  Location: [facility];  Service: Urology;;   CYSTOSCOPY W/ URETERAL STENT PLACEMENT Left 01/17/2017   Procedure: CYSTOSCOPY WITH STENT REPLACEMENT;  Surgeon: Chauncey Redell Agent, MD;  Location: [facility];  Service: Urology;  Laterality: Left;   DILATATION & CURETTAGE/HYSTEROSCOPY WITH MYOSURE N/A 07/08/2015   Procedure: DILATATION & CURETTAGE/HYSTEROSCOPY WITH MYOSURE;  Surgeon: Lamar SHAUNNA Lesches, MD;  Location: [facility];  Service: Gynecology;  Laterality: N/A;   DILATION AND CURETTAGE OF UTERUS      1/17   EXTRACORPOREAL SHOCK WAVE LITHOTRIPSY Left 12/07/2016   Procedure: EXTRACORPOREAL SHOCK WAVE LITHOTRIPSY (ESWL);  Surgeon: Chauncey Redell Agent, MD;  Location: [facility];  Service: Urology;  Laterality: Left;   GASTRIC BYPASS  2005   KNEE SURGERY Right    ACL- repair   LITHOTRIPSY     MIDDLE EAR SURGERY     NASAL SEPTUM SURGERY     OVARY SURGERY Right    ROTATOR CUFF REPAIR     URETEROSCOPY WITH HOLMIUM LASER LITHOTRIPSY     perforated ureter. had nephrostomy tube for brief time   URETEROSCOPY WITH HOLMIUM LASER LITHOTRIPSY Left 01/17/2017   Procedure: URETEROSCOPY WITH HOLMIUM LASER LITHOTRIPSY;  Surgeon: Chauncey Redell Agent, MD;  Location: [facility];  Service: Urology;  Laterality: Left;   Patient Active Problem List   Diagnosis Date Noted   Closed fracture of proximal end of right humerus 08/11/2023   Chronic myeloid leukemia (CML), BCR/ABL-positive (HCC) 08/06/2023   Essential hypertension 04/04/2023   TIA (transient ischemic attack) 08/30/2022   Stroke (HCC) 07/06/2022   Complex sleep apnea syndrome 08/08/2021   Encounter for BiPAP use counseling 08/08/2021   Mass of upper inner quadrant of left breast 07/19/2021   Vaginal atrophy 07/19/2021   Family history of breast  cancer 07/19/2021   OSA on CPAP 04/19/2020   CPAP use counseling 04/19/2020   Nephrolithiasis 01/26/2017   Left ureteral stone 01/03/2017   Endometrial disorder 07/08/2015   Bilateral occipital neuralgia 03/02/2015   DDD (degenerative disc disease), lumbar 12/24/2014   Facet syndrome, lumbar 12/24/2014   Sacroiliac joint dysfunction 12/24/2014   PCP: Derick Leita POUR, MD  REFERRING PROVIDER: Danella Donnice RIGGERS   REFERRING DIAG: [phone] (ICD-10-CM) - Unspecified fracture of upper end of right humerus, subsequent encounter for fracture with routine healing  THERAPY DIAG:  No diagnosis found.  Rationale for Evaluation and Treatment: Rehabilitation  ONSET DATE: 07/30/23  Fall resulting in R proximal  humerus fx  SUBJECTIVE:                                                                                                                                                                                      SUBJECTIVE STATEMENT: Pt. Fell on 2/17 while bring the trash bin in resulting in R proximal humerus fracture.  Pt. States the last x-ray revealed fracture is healed.  Pt. Reports 8/10 R shoulder pain at worst and demonstrates minimal movement.  Pt. Sleeping on back with R shoulder off the pillow.  Pt. 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  Pt. Is taking Oxy for pain and remains guarded with R shoulder movement.  Pt. States she has been doing pendulum ex.   Hand dominance: Right  PERTINENT HISTORY: Pt. Well known to PT clinic.  See MD notes.   PAIN:  Are you having pain? Yes: NPRS scale: 8/10 Pain location: R proximal shoulder Pain description: aching/ sharp Aggravating factors: movement Relieving factors: meds/ rest  PRECAUTIONS: Shoulder  RED FLAGS: None   WEIGHT BEARING RESTRICTIONS: No  FALLS:  Has patient fallen in last 6 months? Yes. Number of falls 1 with injury  LIVING ENVIRONMENT: Lives with: lives with their family and lives alone Has following equipment at home: None  OCCUPATION: Editor, commissioning at [employer]  PLOF: Independent  PATIENT GOALS: Increase R shoulder ROM/ strength/ return to [employer]  NEXT MD VISIT:   OBJECTIVE:  Note: Objective measures were completed at Evaluation unless otherwise noted.  DIAGNOSTIC FINDINGS:  See imaging  PATIENT SURVEYS:  Quick Dash 90.9% significant limitations  COGNITION: Overall cognitive status: Within functional limits for tasks assessed     SENSATION: WFL  POSTURE: Forward head/ rounded shoulder posture  UPPER EXTREMITY ROM:   Active ROM Right PROM eval Left AROM eval  Shoulder flexion 122 deg. 150 deg.  Shoulder extension    Shoulder abduction 81 deg. 150 deg.  Shoulder  adduction    Shoulder  internal rotation 74 deg. 85 deg.  Shoulder external rotation 18 deg. 85 deg.  Elbow flexion [facility] WFL  Elbow extension    Wrist flexion    Wrist extension    Wrist ulnar deviation    Wrist radial deviation    Wrist pronation    Wrist supination limited   (Blank rows = not tested)  No AROM in R shoulder flexion/ abduction.  R forearm limited supination as compared to L.  UPPER EXTREMITY MMT:  MMT Right eval Left eval  Shoulder flexion Unable to test R UE   Shoulder extension    Shoulder abduction    Shoulder adduction    Shoulder internal rotation    Shoulder external rotation    Middle trapezius    Lower trapezius    Elbow flexion    Elbow extension    Wrist flexion    Wrist extension    Wrist ulnar deviation    Wrist radial deviation    Wrist pronation    Wrist supination    Grip strength (lbs) 10.6# 19.8%  (Blank rows = not tested)  SHOULDER SPECIAL TESTS: Not able to test at this time  JOINT MOBILITY TESTING:  Guarded movement.  Significant crepitus  PALPATION:  R/L mid-bicep circumference: 39 cm/ 28.5 cm.   10.5 cm difference.  Tenderness  R shoulder AROM: supine (126 deg.), seated (108 deg.).   UPPER EXTREMITY ROM:   Active ROM Right AROM eval Left AROM eval  Shoulder flexion 109 deg. 142 deg.  Shoulder extension    Shoulder abduction 72 deg. 138 deg.  Shoulder adduction    Shoulder internal rotation 44 deg. 88 deg.  Shoulder external rotation 18 deg. 82 deg.  Elbow flexion [facility] WFL  Elbow extension    Wrist flexion    Wrist extension    Wrist ulnar deviation    Wrist radial deviation    Wrist pronation [facility] WFL  Wrist supination WFL WFL  (Blank rows = not tested)  UPPER EXTREMITY MMT:  MMT Right  Left   Shoulder flexion 3 4  Shoulder extension    Shoulder abduction 3 4-  Shoulder adduction    Shoulder internal rotation 3- 4+  Shoulder external rotation 3 4  Middle trapezius    Lower trapezius    Elbow  flexion 4- 4  Elbow extension 3 4+  Wrist flexion 4+ 4+  Wrist extension 4+ 4+  Wrist ulnar deviation    Wrist radial deviation    Wrist pronation    Wrist supination    Grip strength (lbs) 28.3# 23.1#  (Blank rows = not tested) Lumbricals: R:5, L:5  QuickDASH: 63.64% (marked improvement)  UPPER EXTREMITY ROM:   Active ROM Right AROM eval Left AROM eval  Shoulder flexion 95 deg. 148 deg.  Shoulder extension    Shoulder abduction 67 deg. 154 deg.  Shoulder adduction    Shoulder internal rotation 54 deg. 100 deg.  Shoulder external rotation 35 deg. 83 deg.  Elbow flexion [facility] WFL  Elbow extension    Wrist flexion    Wrist extension    Wrist ulnar deviation    Wrist radial deviation    Wrist pronation [facility] WFL  Wrist supination WFL WFL  (Blank rows = not tested)   UPPER EXTREMITY MMT: NT due to unhealed fracture  TREATMENT DATE: 01/29/2024  Subjective:  Pt. reports 5/10 R shoulder pain at the proximal humerus and anterior shoulder region prior to tx. Session. Pt. Will return to Dr. Marchia on Thursday August 21st.  UPPER EXTREMITY ROM:   Active ROM Right AROM eval   Shoulder flexion 97 deg.   Shoulder extension    Shoulder abduction 76 deg.   Shoulder adduction    Shoulder internal rotation 29 deg.   Shoulder external rotation 33 deg.   Elbow flexion WFL   Elbow extension    Wrist flexion    Wrist extension    Wrist ulnar deviation    Wrist radial deviation    Wrist pronation WFL   Wrist supination WFL   (Blank rows = not tested)  MMT: R/L bicep (4-/4-), triceps (4-/4-),Grip strength: R 21.5#.  L 16.2#.    Slight decrease in R shoulder AROM noted today as compared to previous reassessment.    There.act.:   Standing/supine R shoulder AAROM with dowel into flexion, abduction, ER  Supine R shoulder manual isometrics:  flexion/extension 10x each with 5 sec hold.  Seated R shoulder manual isometrics: ER/IR 10x each with 5 sec hold  Supine R serratus punches 20x with good control.    Standing wall push-ups/ serratus punches with ball at wall (CW/CCW).    B UBE 3 min. forward/ 3 min. backwards (Warm-up)- discussed shoulder pain symptoms/ daily tasks.    Discussed trying quadruped position for UE wt. Bearing but pt. Concerned about knees/ tailbone.    Review of HEP  PATIENT EDUCATION: Education details: See HEP Person educated: Patient Education method: Explanation, Demonstration, and Handouts Education comprehension: verbalized understanding and returned demonstration  HOME EXERCISE PROGRAM: Access Code: [email or web address] URL: [email or web address] Date: 09/20/2023 Prepared by: Ozell Sero  Exercises - Seated Bicep Curls with Bar  - 2 x daily - 7 x weekly - 2 sets - 10 reps - Seated Forearm Pronation and Supination AROM  - 2 x daily - 7 x weekly - 2 sets - 10 reps - Seated Shoulder Flexion AAROM with Pulley Behind  - 2 x daily - 7 x weekly - 2 sets - 10 reps - Seated Shoulder Scaption AAROM with Pulley at Side  - 2 x daily - 7 x weekly - 2 sets - 10 reps - Seated Shoulder Abduction AAROM with Pulley Behind  - 2 x daily - 7 x weekly - 2 sets - 10 reps - Circular Shoulder Pendulum with Table Support  - 2 x daily - 7 x weekly - 2 sets - 10 reps  Access Code: [email or web address] URL: [email or web address] Date: 10/23/2023 Prepared by: Ozell Sero Exercises - Scapular Retraction with Resistance  - 1 x daily - 7 x weekly - 2 sets - 10 reps - Shoulder extension with resistance - Neutral  - 1 x daily - 7 x weekly - 2 sets - 10 reps - Shoulder External Rotation and Scapular Retraction with Resistance  - 1 x daily - 7 x weekly - 2 sets - 10 reps  ASSESSMENT:  CLINICAL IMPRESSION: Pt. remains limited by R shoulder pain and fatigue during shoulder isometric strengthening exercises,  particularly with supine shoulder flexion/extension and seated IR. Attempted to initiate activities in quadruped position on this day to promote weight bearing through right UE but pt. unwilling to attempt due to potential flair up of knee and tailbone issues.  Pt. With improved ability to complete wall pushups on this day through a greater ROM and with equal distribution through  bilateral Ues. Pt. Continues to be limited with right shoulder IR, flexion, and abduction AROM. Pt. Further limited by strength deficits in right UE which continues to affect her daily function. Pt. will continue to benefit from skilled PT services to increase R shoulder ROM/ strength to improve overhead reaching/ pain-free mobility in order to complete daily tasks.   OBJECTIVE IMPAIRMENTS: decreased activity tolerance, decreased mobility, decreased ROM, decreased strength, hypomobility, impaired flexibility, impaired UE functional use, improper body mechanics, postural dysfunction, and pain.   ACTIVITY LIMITATIONS: carrying, lifting, bathing, toileting, dressing, self feeding, reach over head, and hygiene/grooming  PARTICIPATION LIMITATIONS: cleaning, laundry, driving, shopping, community activity, and occupation  PERSONAL FACTORS: Fitness and Past/current experiences are also affecting patient's functional outcome.   REHAB POTENTIAL: Good  CLINICAL DECISION MAKING: Evolving/moderate complexity  EVALUATION COMPLEXITY: Moderate   GOALS: Goals reviewed with patient? Yes  LONG TERM GOALS: Target date: 03/05/24  Pt. Will decrease QuickDASH to <50% to improve UE functional mobIlity.   Baseline:  90.9%.  5/29: 73% (improvement noted).  7/1: 81.4%.  7/23: 63.64% Goal status: Partially met  2.  Pt. Will increase R shoulder flexion AROM to >120 deg. To reaching overhead/ manage hair.   Baseline:  no AROM at time of evaluation.  5/29: Supine R shoulder AROM 128 deg./ seated 108 deg. 7/23: R shoulder flexion AROM 95 deg  seated.  8/13: see above Goal status: Partially met  3.  Pt. Will present with 4/5 MMT in R UE to improve daily household/ work-related tasks.   Baseline:  see above. 7/23: unable to test due to unhealed fracture.   Goal status: Partially met  4.  Pt. Will report no R shoulder pain/limitations with daily tasks to improve return to PLOF.   Baseline:  R shoulder pain/ limited. 7/23: R shoulder pain/ limited  Goal status: Not met  PLAN:  PT FREQUENCY: 2x/week  PT DURATION: 6 weeks  PLANNED INTERVENTIONS: 97110-Therapeutic exercises, 97530- Therapeutic activity, 97112- Neuromuscular re-education, 97535- Self Care, [id number]- Manual therapy, [id number]- Electrical stimulation (unattended), Patient/Family education, Joint mobilization, Cryotherapy, and Moist heat  PLAN FOR NEXT SESSION:  Progress UE wt. Bearing ex.    Curtistine Bracket, SPT  Ozell JAYSON Sero, PT, DPT # [phone] 01/29/2024, 1:55 PM

## 2024-01-31 ENCOUNTER — Encounter: Admitting: Physical Therapy

## 2024-02-04 ENCOUNTER — Ambulatory Visit: Admitting: Physical Therapy

## 2024-02-06 ENCOUNTER — Ambulatory Visit: Admitting: Physical Therapy

## 2024-02-12 ENCOUNTER — Ambulatory Visit: Attending: Orthopedic Surgery | Admitting: Physical Therapy

## 2024-02-14 ENCOUNTER — Ambulatory Visit: Admitting: Physical Therapy

## 2024-02-18 ENCOUNTER — Ambulatory Visit: Admitting: Physical Therapy

## 2024-02-20 ENCOUNTER — Other Ambulatory Visit: Payer: Self-pay | Admitting: Orthopedic Surgery

## 2024-02-20 ENCOUNTER — Ambulatory Visit: Admitting: Physical Therapy

## 2024-02-20 DIAGNOSIS — M25511 Pain in right shoulder: Secondary | ICD-10-CM

## 2024-02-25 ENCOUNTER — Ambulatory Visit: Attending: Orthopedic Surgery | Admitting: Physical Therapy

## 2024-02-25 ENCOUNTER — Ambulatory Visit: Admitting: Physical Therapy

## 2024-02-25 DIAGNOSIS — R293 Abnormal posture: Secondary | ICD-10-CM | POA: Insufficient documentation

## 2024-02-25 DIAGNOSIS — M6281 Muscle weakness (generalized): Secondary | ICD-10-CM | POA: Insufficient documentation

## 2024-02-25 DIAGNOSIS — M25611 Stiffness of right shoulder, not elsewhere classified: Secondary | ICD-10-CM | POA: Insufficient documentation

## 2024-02-25 DIAGNOSIS — G8929 Other chronic pain: Secondary | ICD-10-CM | POA: Diagnosis present

## 2024-02-25 DIAGNOSIS — M25511 Pain in right shoulder: Secondary | ICD-10-CM | POA: Diagnosis present

## 2024-02-25 NOTE — Therapy (Signed)
 OUTPATIENT PHYSICAL THERAPY SHOULDER TREATMENT  Physical Therapy Progress Note  Dates of reporting period  11/15/23   to   02/25/24   Patient Name: Mariah Reeves MRN: [id number] DOB:June 03, 1966, 58 y.o., female Today's Date: 02/25/2024  END OF SESSION:  PT End of Session - 02/25/24 0731     Visit Number 20    Number of Visits 30    Date for PT Re-Evaluation 03/05/24    PT Start Time 0732    PT Stop Time 0815    PT Time Calculation (min) 43 min    Activity Tolerance Patient tolerated treatment well;Patient limited by fatigue;Patient limited by pain    Behavior During Therapy [facility] for tasks assessed/performed          Past Medical History:  Diagnosis Date   Allergy    Anomaly, uterus    tumor of uterus   Anxiety    Bilateral occipital neuralgia    Bipolar 1 disorder (HCC)    Chronic back pain    Complication of anesthesia    hard time waking up after gastric bypass.Pt stated that she has a small mouth and has had several surgeries since gastric bypass with no problems.   Degenerative disc disease, lumbar    Depression    Facet syndrome, lumbar    Family history of breast cancer    2/21 cancer genetic testing letter sent   GERD (gastroesophageal reflux disease)    before gastric bypass   Headache    Migraines   History of kidney stones    Left   Hx of vertigo    Patella fracture    Left   PTSD (post-traumatic stress disorder)    Sleep apnea    C-PAP   Wears glasses    Past Surgical History:  Procedure Laterality Date   ABDOMINOPLASTY  2006   CARPAL TUNNEL RELEASE Left    CHOLECYSTECTOMY     COLONOSCOPY W/ POLYPECTOMY     CYSTOSCOPY W/ URETERAL STENT PLACEMENT  01/03/2017   Procedure: CYSTOSCOPY WITH RETROGRADE PYELOGRAM/URETERAL STENT PLACEMENT;  Surgeon: Chauncey Redell Agent, MD;  Location: [facility];  Service: Urology;;   CYSTOSCOPY W/ URETERAL STENT PLACEMENT Left 01/17/2017   Procedure: CYSTOSCOPY WITH STENT REPLACEMENT;  Surgeon: Chauncey Redell Agent, MD;   Location: [facility];  Service: Urology;  Laterality: Left;   DILATATION & CURETTAGE/HYSTEROSCOPY WITH MYOSURE N/A 07/08/2015   Procedure: DILATATION & CURETTAGE/HYSTEROSCOPY WITH MYOSURE;  Surgeon: Lamar SHAUNNA Lesches, MD;  Location: [facility];  Service: Gynecology;  Laterality: N/A;   DILATION AND CURETTAGE OF UTERUS     1/17   EXTRACORPOREAL SHOCK WAVE LITHOTRIPSY Left 12/07/2016   Procedure: EXTRACORPOREAL SHOCK WAVE LITHOTRIPSY (ESWL);  Surgeon: Chauncey Redell Agent, MD;  Location: [facility];  Service: Urology;  Laterality: Left;   GASTRIC BYPASS  2005   KNEE SURGERY Right    ACL- repair   LITHOTRIPSY     MIDDLE EAR SURGERY     NASAL SEPTUM SURGERY     OVARY SURGERY Right    ROTATOR CUFF REPAIR     URETEROSCOPY WITH HOLMIUM LASER LITHOTRIPSY     perforated ureter. had nephrostomy tube for brief time   URETEROSCOPY WITH HOLMIUM LASER LITHOTRIPSY Left 01/17/2017   Procedure: URETEROSCOPY WITH HOLMIUM LASER LITHOTRIPSY;  Surgeon: Chauncey Redell Agent, MD;  Location: [facility];  Service: Urology;  Laterality: Left;   Patient Active Problem List   Diagnosis Date Noted   Closed fracture of proximal end of right humerus 08/11/2023  Chronic myeloid leukemia (CML), BCR/ABL-positive (HCC) 08/06/2023   Essential hypertension 04/04/2023   TIA (transient ischemic attack) 08/30/2022   Stroke (HCC) 07/06/2022   Complex sleep apnea syndrome 08/08/2021   Encounter for BiPAP use counseling 08/08/2021   Mass of upper inner quadrant of left breast 07/19/2021   Vaginal atrophy 07/19/2021   Family history of breast cancer 07/19/2021   OSA on CPAP 04/19/2020   CPAP use counseling 04/19/2020   Nephrolithiasis 01/26/2017   Left ureteral stone 01/03/2017   Endometrial disorder 07/08/2015   Bilateral occipital neuralgia 03/02/2015   DDD (degenerative disc disease), lumbar 12/24/2014   Facet syndrome, lumbar 12/24/2014   Sacroiliac joint dysfunction 12/24/2014   PCP: Derick Leita POUR, MD  REFERRING PROVIDER:  Danella Donnice RIGGERS   REFERRING DIAG: D57.798I (ICD-10-CM) - Unspecified fracture of upper end of right humerus, subsequent encounter for fracture with routine healing  THERAPY DIAG:  Chronic right shoulder pain  Muscle weakness (generalized)  Shoulder joint stiffness, right  Abnormal posture  Rationale for Evaluation and Treatment: Rehabilitation  ONSET DATE: 07/30/23  Fall resulting in R proximal humerus fx  SUBJECTIVE:                                                                                                                                                                                      SUBJECTIVE STATEMENT: Pt. Fell on 2/17 while bring the trash bin in resulting in R proximal humerus fracture.  Pt. States the last x-ray revealed fracture is healed.  Pt. Reports 8/10 R shoulder pain at worst and demonstrates minimal movement.  Pt. Sleeping on back with R shoulder off the pillow.  Pt. 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  Pt. Is taking Oxy for pain and remains guarded with R shoulder movement.  Pt. States she has been doing pendulum ex.   Hand dominance: Right  PERTINENT HISTORY: Pt. Well known to PT clinic.  See MD notes.   PAIN:  Are you having pain? Yes: NPRS scale: 8/10 Pain location: R proximal shoulder Pain description: aching/ sharp Aggravating factors: movement Relieving factors: meds/ rest  PRECAUTIONS: Shoulder  RED FLAGS: None   WEIGHT BEARING RESTRICTIONS: No  FALLS:  Has patient fallen in last 6 months? Yes. Number of falls 1 with injury  LIVING ENVIRONMENT: Lives with: lives with their family and lives alone Has following equipment at home: None  OCCUPATION: Editor, commissioning at [employer]  PLOF: Independent  PATIENT GOALS: Increase R shoulder ROM/ strength/ return to [employer]  NEXT MD VISIT:   OBJECTIVE:  Note: Objective measures were completed at Evaluation unless otherwise noted.  DIAGNOSTIC FINDINGS:  See  imaging  PATIENT SURVEYS:  Quick Dash 90.9% significant limitations  COGNITION: Overall cognitive status: Within functional limits for tasks assessed     SENSATION: WFL  POSTURE: Forward head/ rounded shoulder posture  UPPER EXTREMITY ROM:   Active ROM Right PROM eval Left AROM eval  Shoulder flexion 122 deg. 150 deg.  Shoulder extension    Shoulder abduction 81 deg. 150 deg.  Shoulder adduction    Shoulder internal rotation 74 deg. 85 deg.  Shoulder external rotation 18 deg. 85 deg.  Elbow flexion [facility] WFL  Elbow extension    Wrist flexion    Wrist extension    Wrist ulnar deviation    Wrist radial deviation    Wrist pronation    Wrist supination limited   (Blank rows = not tested)  No AROM in R shoulder flexion/ abduction.  R forearm limited supination as compared to L.  UPPER EXTREMITY MMT:  MMT Right eval Left eval  Shoulder flexion Unable to test R UE   Shoulder extension    Shoulder abduction    Shoulder adduction    Shoulder internal rotation    Shoulder external rotation    Middle trapezius    Lower trapezius    Elbow flexion    Elbow extension    Wrist flexion    Wrist extension    Wrist ulnar deviation    Wrist radial deviation    Wrist pronation    Wrist supination    Grip strength (lbs) 10.6# 19.8%  (Blank rows = not tested)  SHOULDER SPECIAL TESTS: Not able to test at this time  JOINT MOBILITY TESTING:  Guarded movement.  Significant crepitus  PALPATION:  R/L mid-bicep circumference: 39 cm/ 28.5 cm.   10.5 cm difference.  Tenderness  R shoulder AROM: supine (126 deg.), seated (108 deg.).   UPPER EXTREMITY ROM:   Active ROM Right AROM eval Left AROM eval  Shoulder flexion 109 deg. 142 deg.  Shoulder extension    Shoulder abduction 72 deg. 138 deg.  Shoulder adduction    Shoulder internal rotation 44 deg. 88 deg.  Shoulder external rotation 18 deg. 82 deg.  Elbow flexion [facility] WFL  Elbow extension    Wrist flexion     Wrist extension    Wrist ulnar deviation    Wrist radial deviation    Wrist pronation [facility] WFL  Wrist supination WFL WFL  (Blank rows = not tested)  UPPER EXTREMITY MMT:  MMT Right  Left   Shoulder flexion 3 4  Shoulder extension    Shoulder abduction 3 4-  Shoulder adduction    Shoulder internal rotation 3- 4+  Shoulder external rotation 3 4  Middle trapezius    Lower trapezius    Elbow flexion 4- 4  Elbow extension 3 4+  Wrist flexion 4+ 4+  Wrist extension 4+ 4+  Wrist ulnar deviation    Wrist radial deviation    Wrist pronation    Wrist supination    Grip strength (lbs) 28.3# 23.1#  (Blank rows = not tested) Lumbricals: R:5, L:5  QuickDASH: 63.64% (marked improvement)  UPPER EXTREMITY ROM:   Active ROM Right AROM eval Left AROM eval  Shoulder flexion 95 deg. 148 deg.  Shoulder extension    Shoulder abduction 67 deg. 154 deg.  Shoulder adduction    Shoulder internal rotation 54 deg. 100 deg.  Shoulder external rotation 35 deg. 83 deg.  Elbow flexion [facility]  Elbow extension    Wrist flexion    Wrist  extension    Wrist ulnar deviation    Wrist radial deviation    Wrist pronation [facility] WFL  Wrist supination WFL WFL  (Blank rows = not tested)   UPPER EXTREMITY MMT: NT due to unhealed fracture   UPPER EXTREMITY ROM:   Active ROM Right AROM eval   Shoulder flexion 97 deg.   Shoulder extension    Shoulder abduction 76 deg.   Shoulder adduction    Shoulder internal rotation 29 deg.   Shoulder external rotation 33 deg.   Elbow flexion WFL   Elbow extension    Wrist flexion    Wrist extension    Wrist ulnar deviation    Wrist radial deviation    Wrist pronation WFL   Wrist supination WFL   (Blank rows = not tested)  MMT: R/L bicep (4-/4-), triceps (4-/4-),Grip strength: R 21.5#.  L 16.2#.                                                                                                                             TREATMENT DATE:  02/25/2024  Subjective:  Pt. reports 1/10 R shoulder pain at the proximal humerus and anterior aspect of right shoulder region prior to tx. session. Pt had follow up appointment with Dr. Marchia on Thursday August 21st which revealed bone growth according to pt and she is now scheduled for CT imaging which is scheduled for Friday to verify the growth.   Manual Therapy PROM of right shoulder (abduction, flexion, scaption) in supine and seated positions  There.act.:   Standing/supine R shoulder AAROM with manual assist from therapist into flexion, abduction, ER  Supine R shoulder manual isometrics: flexion/extension 2x10 each with 5 sec hold.  Seated R shoulder manual isometrics: ER/IR 2x10 each with 5 sec hold  Supine R serratus punches 20x with good control.    Supine R shoulder rhythmic stabilization, 2x20 sec.  Standing wall push-ups/ serratus punches with ball at wall (CW/CCW). 2x15 each   Active ROM Right AROM eval Left AROM eval  Shoulder flexion 85 deg. 148 deg.  Shoulder extension    Shoulder abduction 74 deg. 154 deg.  Shoulder adduction    Shoulder internal rotation 52 deg. 100 deg.  Shoulder external rotation 30 deg. 83 deg.   Not Performed today:  B UBE 3 min. forward/ 3 min. backwards (Warm-up)- discussed shoulder pain symptoms/ daily tasks.    PATIENT EDUCATION: Education details: See HEP Person educated: Patient Education method: Explanation, Demonstration, and Handouts Education comprehension: verbalized understanding and returned demonstration  HOME EXERCISE PROGRAM: Access Code: [email or web address] URL: [email or web address] Date: 09/20/2023 Prepared by: Ozell Sero  Exercises - Seated Bicep Curls with Bar  - 2 x daily - 7 x weekly - 2 sets - 10 reps - Seated Forearm Pronation and Supination AROM  - 2 x daily - 7 x weekly - 2 sets - 10 reps - Seated Shoulder Flexion AAROM with Pulley Behind  - 2 x daily -  7 x weekly - 2 sets - 10 reps -  Seated Shoulder Scaption AAROM with Pulley at Side  - 2 x daily - 7 x weekly - 2 sets - 10 reps - Seated Shoulder Abduction AAROM with Pulley Behind  - 2 x daily - 7 x weekly - 2 sets - 10 reps - Circular Shoulder Pendulum with Table Support  - 2 x daily - 7 x weekly - 2 sets - 10 reps  Access Code: [email or web address] URL: [email or web address] Date: 10/23/2023 Prepared by: Ozell Sero Exercises - Scapular Retraction with Resistance  - 1 x daily - 7 x weekly - 2 sets - 10 reps - Shoulder extension with resistance - Neutral  - 1 x daily - 7 x weekly - 2 sets - 10 reps - Shoulder External Rotation and Scapular Retraction with Resistance  - 1 x daily - 7 x weekly - 2 sets - 10 reps  ASSESSMENT:  CLINICAL IMPRESSION: Pt presented to physical therapy with mild 1/10 right shoulder pain that increased to a 4/10 at end of tx session. Pt was introduced to right shoulder rhythmic stabilization exercise in supine and shoulder at 90 degrees of flexion which she tolerated without an increase in pain but did experience fatigue at end of each set. Pt AROM of right shoulder measured on this day with regression in all directions from previously recorded visits which pt attributed to feelings of weakness,: likely due to no physical therapy visits in the past month.   Pt has attended and participated in 20 physical therapy sessions to date and has seen improvements in gross UE strength, activity tolerance, and pain levels. Pt is still limited with decreased right shoulder mobility, UE strength, and hypomobility of right shoulder joint which has been complicated by month long break from physical therapy. 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 Pt will continue to benefit from skilled physical therapy interventions in order to address current impairments  and maximize functional outcomes.   OBJECTIVE IMPAIRMENTS: decreased activity tolerance, decreased mobility, decreased ROM, decreased strength, hypomobility, impaired flexibility, impaired UE functional use, improper body mechanics, postural dysfunction, and pain.   ACTIVITY LIMITATIONS: carrying, lifting, bathing, toileting, dressing, self feeding, reach over head, and hygiene/grooming  PARTICIPATION LIMITATIONS: cleaning, laundry, driving, shopping, community activity, and occupation  PERSONAL FACTORS: Fitness and Past/current experiences are also affecting patient's functional outcome.   REHAB POTENTIAL: Good  CLINICAL DECISION MAKING: Evolving/moderate complexity  EVALUATION COMPLEXITY: Moderate   GOALS: Goals reviewed with patient? Yes  LONG TERM GOALS: Target date: 03/05/24  Pt. Will decrease QuickDASH to <50% to improve UE functional mobIlity.   Baseline:  90.9%.  5/29: 73% (improvement noted).  7/1: 81.4%.  7/23: 63.64% Goal status: Partially met  2.  Pt. Will increase R shoulder flexion AROM to >120 deg. To reaching overhead/ manage hair.   Baseline:  no AROM at time of evaluation.  5/29: Supine R shoulder AROM 128 deg./ seated 108 deg. 7/23: R shoulder flexion AROM 95 deg seated.  8/13: see above Goal status: Partially met  3.  Pt. Will present with 4/5 MMT in R UE to improve daily household/ work-related tasks.   Baseline:  see above. 7/23: unable to test due to unhealed fracture.   Goal status: Partially met  4.  Pt. Will report no R shoulder pain/limitations with daily tasks to improve return to  PLOF.   Baseline:  R shoulder pain/ limited. 7/23: R shoulder pain/ limited  Goal status: Not met  PLAN:  PT FREQUENCY: 2x/week  PT DURATION: 6 weeks  PLANNED INTERVENTIONS: 97110-Therapeutic exercises, 97530- Therapeutic activity, 97112- Neuromuscular re-education, 97535- Self Care, [id number]- Manual therapy, [id number]- Electrical stimulation (unattended), Patient/Family  education, Joint mobilization, Cryotherapy, and Moist heat  PLAN FOR NEXT SESSION:  Focus on isometrics and AAROM of right shoulder in various positions (seated, supine, standing).  Curtistine Bracket, SPT  Ozell JAYSON Sero, PT, DPT # [phone] 02/25/2024, 8:13 AM

## 2024-02-27 ENCOUNTER — Encounter: Payer: Self-pay | Admitting: Physical Therapy

## 2024-02-27 ENCOUNTER — Ambulatory Visit: Admitting: Physical Therapy

## 2024-02-27 ENCOUNTER — Encounter: Admitting: Physical Therapy

## 2024-02-27 DIAGNOSIS — R293 Abnormal posture: Secondary | ICD-10-CM

## 2024-02-27 DIAGNOSIS — M6281 Muscle weakness (generalized): Secondary | ICD-10-CM

## 2024-02-27 DIAGNOSIS — M25611 Stiffness of right shoulder, not elsewhere classified: Secondary | ICD-10-CM

## 2024-02-27 DIAGNOSIS — M25511 Pain in right shoulder: Secondary | ICD-10-CM | POA: Diagnosis not present

## 2024-02-27 DIAGNOSIS — G8929 Other chronic pain: Secondary | ICD-10-CM

## 2024-02-27 NOTE — Therapy (Signed)
 OUTPATIENT PHYSICAL THERAPY SHOULDER TREATMENT   Patient Name: Mariah Reeves MRN: [id number] DOB:February 05, 1966, 58 y.o., female Today's Date: 02/27/2024  END OF SESSION:  PT End of Session - 02/27/24 1438     Visit Number 21    Number of Visits 30    Date for PT Re-Evaluation 03/05/24    PT Start Time 1433    PT Stop Time 1514    PT Time Calculation (min) 41 min    Activity Tolerance Patient tolerated treatment well;Patient limited by fatigue;Patient limited by pain    Behavior During Therapy WFL for tasks assessed/performed         Past Medical History:  Diagnosis Date   Allergy    Anomaly, uterus    tumor of uterus   Anxiety    Bilateral occipital neuralgia    Bipolar 1 disorder (HCC)    Chronic back pain    Complication of anesthesia    hard time waking up after gastric bypass.Pt stated that she has a small mouth and has had several surgeries since gastric bypass with no problems.   Degenerative disc disease, lumbar    Depression    Facet syndrome, lumbar    Family history of breast cancer    2/21 cancer genetic testing letter sent   GERD (gastroesophageal reflux disease)    before gastric bypass   Headache    Migraines   History of kidney stones    Left   Hx of vertigo    Patella fracture    Left   PTSD (post-traumatic stress disorder)    Sleep apnea    C-PAP   Wears glasses    Past Surgical History:  Procedure Laterality Date   ABDOMINOPLASTY  2006   CARPAL TUNNEL RELEASE Left    CHOLECYSTECTOMY     COLONOSCOPY W/ POLYPECTOMY     CYSTOSCOPY W/ URETERAL STENT PLACEMENT  01/03/2017   Procedure: CYSTOSCOPY WITH RETROGRADE PYELOGRAM/URETERAL STENT PLACEMENT;  Surgeon: Chauncey Redell Agent, MD;  Location: [facility];  Service: Urology;;   CYSTOSCOPY W/ URETERAL STENT PLACEMENT Left 01/17/2017   Procedure: CYSTOSCOPY WITH STENT REPLACEMENT;  Surgeon: Chauncey Redell Agent, MD;  Location: [facility];  Service: Urology;  Laterality: Left;   DILATATION &  CURETTAGE/HYSTEROSCOPY WITH MYOSURE N/A 07/08/2015   Procedure: DILATATION & CURETTAGE/HYSTEROSCOPY WITH MYOSURE;  Surgeon: Lamar SHAUNNA Lesches, MD;  Location: [facility];  Service: Gynecology;  Laterality: N/A;   DILATION AND CURETTAGE OF UTERUS     1/17   EXTRACORPOREAL SHOCK WAVE LITHOTRIPSY Left 12/07/2016   Procedure: EXTRACORPOREAL SHOCK WAVE LITHOTRIPSY (ESWL);  Surgeon: Chauncey Redell Agent, MD;  Location: [facility];  Service: Urology;  Laterality: Left;   GASTRIC BYPASS  2005   KNEE SURGERY Right    ACL- repair   LITHOTRIPSY     MIDDLE EAR SURGERY     NASAL SEPTUM SURGERY     OVARY SURGERY Right    ROTATOR CUFF REPAIR     URETEROSCOPY WITH HOLMIUM LASER LITHOTRIPSY     perforated ureter. had nephrostomy tube for brief time   URETEROSCOPY WITH HOLMIUM LASER LITHOTRIPSY Left 01/17/2017   Procedure: URETEROSCOPY WITH HOLMIUM LASER LITHOTRIPSY;  Surgeon: Chauncey Redell Agent, MD;  Location: [facility];  Service: Urology;  Laterality: Left;   Patient Active Problem List   Diagnosis Date Noted   Closed fracture of proximal end of right humerus 08/11/2023   Chronic myeloid leukemia (CML), BCR/ABL-positive (HCC) 08/06/2023   Essential hypertension 04/04/2023   TIA (transient ischemic attack) 08/30/2022  Stroke (HCC) 07/06/2022   Complex sleep apnea syndrome 08/08/2021   Encounter for BiPAP use counseling 08/08/2021   Mass of upper inner quadrant of left breast 07/19/2021   Vaginal atrophy 07/19/2021   Family history of breast cancer 07/19/2021   OSA on CPAP 04/19/2020   CPAP use counseling 04/19/2020   Nephrolithiasis 01/26/2017   Left ureteral stone 01/03/2017   Endometrial disorder 07/08/2015   Bilateral occipital neuralgia 03/02/2015   DDD (degenerative disc disease), lumbar 12/24/2014   Facet syndrome, lumbar 12/24/2014   Sacroiliac joint dysfunction 12/24/2014   PCP: Derick Leita POUR, MD  REFERRING PROVIDER: Danella Donnice RIGGERS   REFERRING DIAG: D57.798I (ICD-10-CM) - Unspecified  fracture of upper end of right humerus, subsequent encounter for fracture with routine healing  THERAPY DIAG:  Chronic right shoulder pain  Muscle weakness (generalized)  Shoulder joint stiffness, right  Abnormal posture  Rationale for Evaluation and Treatment: Rehabilitation  ONSET DATE: 07/30/23  Fall resulting in R proximal humerus fx  SUBJECTIVE:                                                                                                                                                                                      SUBJECTIVE STATEMENT: Pt. Fell on 2/17 while bring the trash bin in resulting in R proximal humerus fracture.  Pt. States the last x-ray revealed fracture is healed.  Pt. Reports 8/10 R shoulder pain at worst and demonstrates minimal movement.  Pt. Sleeping on back with R shoulder off the pillow.  Pt. 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  Pt. Is taking Oxy for pain and remains guarded with R shoulder movement.  Pt. States she has been doing pendulum ex.   Hand dominance: Right  PERTINENT HISTORY: Pt. Well known to PT clinic.  See MD notes.   PAIN:  Are you having pain? Yes: NPRS scale: 8/10 Pain location: R proximal shoulder Pain description: aching/ sharp Aggravating factors: movement Relieving factors: meds/ rest  PRECAUTIONS: Shoulder  RED FLAGS: None   WEIGHT BEARING RESTRICTIONS: No  FALLS:  Has patient fallen in last 6 months? Yes. Number of falls 1 with injury  LIVING ENVIRONMENT: Lives with: lives with their family and lives alone Has following equipment at home: None  OCCUPATION: Editor, commissioning at [employer]  PLOF: Independent  PATIENT GOALS: Increase R shoulder ROM/ strength/ return to [employer]  NEXT MD VISIT:   OBJECTIVE:  Note: Objective measures were completed at Evaluation unless otherwise noted.  DIAGNOSTIC FINDINGS:  See imaging  PATIENT SURVEYS:  Quick Dash 90.9% significant  limitations  COGNITION: Overall cognitive status: Within functional limits for tasks assessed  SENSATION: WFL  POSTURE: Forward head/ rounded shoulder posture  UPPER EXTREMITY ROM:   Active ROM Right PROM eval Left AROM eval  Shoulder flexion 122 deg. 150 deg.  Shoulder extension    Shoulder abduction 81 deg. 150 deg.  Shoulder adduction    Shoulder internal rotation 74 deg. 85 deg.  Shoulder external rotation 18 deg. 85 deg.  Elbow flexion [facility] WFL  Elbow extension    Wrist flexion    Wrist extension    Wrist ulnar deviation    Wrist radial deviation    Wrist pronation    Wrist supination limited   (Blank rows = not tested)  No AROM in R shoulder flexion/ abduction.  R forearm limited supination as compared to L.  UPPER EXTREMITY MMT:  MMT Right eval Left eval  Shoulder flexion Unable to test R UE   Shoulder extension    Shoulder abduction    Shoulder adduction    Shoulder internal rotation    Shoulder external rotation    Middle trapezius    Lower trapezius    Elbow flexion    Elbow extension    Wrist flexion    Wrist extension    Wrist ulnar deviation    Wrist radial deviation    Wrist pronation    Wrist supination    Grip strength (lbs) 10.6# 19.8%  (Blank rows = not tested)  SHOULDER SPECIAL TESTS: Not able to test at this time  JOINT MOBILITY TESTING:  Guarded movement.  Significant crepitus  PALPATION:  R/L mid-bicep circumference: 39 cm/ 28.5 cm.   10.5 cm difference.  Tenderness  R shoulder AROM: supine (126 deg.), seated (108 deg.).   UPPER EXTREMITY ROM:   Active ROM Right AROM eval Left AROM eval  Shoulder flexion 109 deg. 142 deg.  Shoulder extension    Shoulder abduction 72 deg. 138 deg.  Shoulder adduction    Shoulder internal rotation 44 deg. 88 deg.  Shoulder external rotation 18 deg. 82 deg.  Elbow flexion [facility] WFL  Elbow extension    Wrist flexion    Wrist extension    Wrist ulnar deviation    Wrist radial  deviation    Wrist pronation [facility] WFL  Wrist supination WFL WFL  (Blank rows = not tested)  UPPER EXTREMITY MMT:  MMT Right  Left   Shoulder flexion 3 4  Shoulder extension    Shoulder abduction 3 4-  Shoulder adduction    Shoulder internal rotation 3- 4+  Shoulder external rotation 3 4  Middle trapezius    Lower trapezius    Elbow flexion 4- 4  Elbow extension 3 4+  Wrist flexion 4+ 4+  Wrist extension 4+ 4+  Wrist ulnar deviation    Wrist radial deviation    Wrist pronation    Wrist supination    Grip strength (lbs) 28.3# 23.1#  (Blank rows = not tested) Lumbricals: R:5, L:5  QuickDASH: 63.64% (marked improvement)  UPPER EXTREMITY ROM:   Active ROM Right AROM eval Left AROM eval  Shoulder flexion 95 deg. 148 deg.  Shoulder extension    Shoulder abduction 67 deg. 154 deg.  Shoulder adduction    Shoulder internal rotation 54 deg. 100 deg.  Shoulder external rotation 35 deg. 83 deg.  Elbow flexion [facility]  Elbow extension    Wrist flexion    Wrist extension    Wrist ulnar deviation    Wrist radial deviation    Wrist pronation [facility] WFL  Wrist supination [facility] WFL  (  Blank rows = not tested)   UPPER EXTREMITY MMT: NT due to unhealed fracture   UPPER EXTREMITY ROM:   Active ROM Right AROM eval   Shoulder flexion 97 deg.   Shoulder extension    Shoulder abduction 76 deg.   Shoulder adduction    Shoulder internal rotation 29 deg.   Shoulder external rotation 33 deg.   Elbow flexion WFL   Elbow extension    Wrist flexion    Wrist extension    Wrist ulnar deviation    Wrist radial deviation    Wrist pronation WFL   Wrist supination WFL   (Blank rows = not tested)  MMT: R/L bicep (4-/4-), triceps (4-/4-),Grip strength: R 21.5#.  L 16.2#.     Active ROM Right AROM eval Left AROM eval  Shoulder flexion 85 deg. 148 deg.  Shoulder extension    Shoulder abduction 74 deg. 154 deg.  Shoulder adduction    Shoulder internal rotation 52 deg. 100 deg.   Shoulder external rotation 30 deg. 83 deg.                                                                                                                             TREATMENT DATE: 02/27/2024  Subjective:  Pt. reports 1/10 R shoulder pain at the proximal humerus and anterior aspect of right shoulder region prior to tx. session.  Pt. Scheduled for CT scan to R shoulder this Friday to check on bone healing.    Manual Therapy:  AA/PROM of right shoulder (abduction, flexion, scaption, ER) in supine and seated positions  There.act.:   Modified standing quadruped on side of mat table (added 1# wt. Reaching with L UE to increase R UE wt. Bearing).    Standing/supine R shoulder AAROM with manual assist from therapist into flexion, abduction, ER  Supine R shoulder manual isometrics: flexion/extension/ER/IR 2x10 each with 5 sec hold.  Supine R serratus punches 20x with good control.    Supine R shoulder rhythmic stabilization, 2x20 sec.  Standing wall push-ups 2 sets of 10   PATIENT EDUCATION: Education details: See HEP Person educated: Patient Education method: Explanation, Demonstration, and Handouts Education comprehension: verbalized understanding and returned demonstration  HOME EXERCISE PROGRAM: Access Code: [email or web address] URL: [email or web address] Date: 09/20/2023 Prepared by: Ozell Sero  Exercises - Seated Bicep Curls with Bar  - 2 x daily - 7 x weekly - 2 sets - 10 reps - Seated Forearm Pronation and Supination AROM  - 2 x daily - 7 x weekly - 2 sets - 10 reps - Seated Shoulder Flexion AAROM with Pulley Behind  - 2 x daily - 7 x weekly - 2 sets - 10 reps - Seated Shoulder Scaption AAROM with Pulley at Side  - 2 x daily - 7 x weekly - 2 sets - 10 reps - Seated Shoulder Abduction AAROM with Pulley Behind  - 2 x daily - 7 x weekly - 2 sets - 10 reps -  Circular Shoulder Pendulum with Table Support  - 2 x daily - 7 x weekly - 2 sets - 10 reps  Access Code:  [email or web address] URL: [email or web address] Date: 10/23/2023 Prepared by: Ozell Sero Exercises - Scapular Retraction with Resistance  - 1 x daily - 7 x weekly - 2 sets - 10 reps - Shoulder extension with resistance - Neutral  - 1 x daily - 7 x weekly - 2 sets - 10 reps - Shoulder External Rotation and Scapular Retraction with Resistance  - 1 x daily - 7 x weekly - 2 sets - 10 reps  ASSESSMENT:  CLINICAL IMPRESSION: Pt remains limited with decreased right shoulder mobility, UE strength, and pain with repeated overhead tasks. 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  Pt. Scheduled for CT scan this Friday to reassess bone healing.  Pt will continue to benefit from skilled physical therapy interventions in order to address current impairments and maximize functional outcomes.   OBJECTIVE IMPAIRMENTS: decreased activity tolerance, decreased mobility, decreased ROM, decreased strength, hypomobility, impaired flexibility, impaired UE functional use, improper body mechanics, postural dysfunction, and pain.   ACTIVITY LIMITATIONS: carrying, lifting, bathing, toileting, dressing, self feeding, reach over head, and hygiene/grooming  PARTICIPATION LIMITATIONS: cleaning, laundry, driving, shopping, community activity, and occupation  PERSONAL FACTORS: Fitness and Past/current experiences are also affecting patient's functional outcome.   REHAB POTENTIAL: Good  CLINICAL DECISION MAKING: Evolving/moderate complexity  EVALUATION COMPLEXITY: Moderate   GOALS: Goals reviewed with patient? Yes  LONG TERM GOALS: Target date: 03/05/24  Pt. Will decrease QuickDASH to <50% to improve UE functional mobIlity.   Baseline:  90.9%.  5/29: 73% (improvement noted).  7/1: 81.4%.  7/23: 63.64% Goal status: Partially met  2.  Pt. Will increase R shoulder  flexion AROM to >120 deg. To reaching overhead/ manage hair.   Baseline:  no AROM at time of evaluation.  5/29: Supine R shoulder AROM 128 deg./ seated 108 deg. 7/23: R shoulder flexion AROM 95 deg seated.  8/13: see above Goal status: Partially met  3.  Pt. Will present with 4/5 MMT in R UE to improve daily household/ work-related tasks.   Baseline:  see above. 7/23: unable to test due to unhealed fracture.   Goal status: Partially met  4.  Pt. Will report no R shoulder pain/limitations with daily tasks to improve return to PLOF.   Baseline:  R shoulder pain/ limited. 7/23: R shoulder pain/ limited  Goal status: Not met  PLAN:  PT FREQUENCY: 2x/week  PT DURATION: 6 weeks  PLANNED INTERVENTIONS: 97110-Therapeutic exercises, 97530- Therapeutic activity, 97112- Neuromuscular re-education, 97535- Self Care, [id number]- Manual therapy, [id number]- Electrical stimulation (unattended), Patient/Family education, Joint mobilization, Cryotherapy, and Moist heat  PLAN FOR NEXT SESSION:  Focus on isometrics and AAROM of right shoulder in various positions (seated, supine, standing).  CHECK CT SCAN results  Curtistine Bracket, SPT  Ozell JAYSON Sero, PT, DPT # [phone] 02/27/2024, 3:48 PM

## 2024-02-29 ENCOUNTER — Ambulatory Visit
Admission: RE | Admit: 2024-02-29 | Discharge: 2024-02-29 | Disposition: A | Discharge status: Home / Self Care | Source: Ambulatory Visit | Attending: Orthopedic Surgery | Admitting: Orthopedic Surgery

## 2024-02-29 DIAGNOSIS — M25511 Pain in right shoulder: Secondary | ICD-10-CM | POA: Diagnosis present

## 2024-03-03 ENCOUNTER — Ambulatory Visit: Admitting: Physical Therapy

## 2024-03-03 ENCOUNTER — Encounter: Admitting: Physical Therapy

## 2024-03-05 ENCOUNTER — Encounter: Admitting: Physical Therapy

## 2024-03-05 ENCOUNTER — Ambulatory Visit: Admitting: Physical Therapy

## 2024-03-10 ENCOUNTER — Encounter: Admitting: Physical Therapy

## 2024-03-10 ENCOUNTER — Ambulatory Visit: Admitting: Physical Therapy

## 2024-03-12 ENCOUNTER — Ambulatory Visit: Admitting: Physical Therapy

## 2024-03-17 ENCOUNTER — Encounter: Admitting: Physical Therapy

## 2024-03-19 ENCOUNTER — Encounter: Admitting: Physical Therapy

## 2024-04-07 ENCOUNTER — Ambulatory Visit: Admitting: Orthopedic Surgery

## 2024-04-07 DIAGNOSIS — M19011 Primary osteoarthritis, right shoulder: Secondary | ICD-10-CM

## 2024-04-08 ENCOUNTER — Encounter: Payer: Self-pay | Admitting: Orthopedic Surgery

## 2024-04-08 NOTE — Progress Notes (Unsigned)
 Office Visit Note   Patient: Mariah Reeves           Date of Birth: 1965/09/17           MRN: [id number] Visit Date: 04/07/2024 Requested by: Marchia Drivers, MD [address] [place],  [place] [address] PCP: Derick Leita POUR, MD  Subjective: Chief Complaint  Patient presents with   Right Shoulder - Pain    HPI: Mariah Reeves is a 58 y.o. female who presents to the office reporting right shoulder pain.  Patient had an injury from a fall in February 2025.  This was a proximal humerus fracture.  She was referred to Mariah Reeves for evaluation and management of a fracture nonunion.  She is right-hand dominant.  Works as a runner, broadcasting/film/video.  Pain wakes her from sleep at night.  She reports some decreased range of motion as well as popping weakness and swelling.  Does take oxycodone  10 mg 4 times a day from pain management.  Has a history of left wrist fracture x 3.  Gastric bypass 20 years ago.  It is hard for her to hold her hand up to write.  She has had plain radiographs and CT scan which does show nonunion of proximal humerus fracture but there is not too much displacement of the head relative to the shaft.  Patient does have chronic leukemia and is being treated at the [facility] cancer center.  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                ROS: All systems reviewed are negative as they relate to the chief complaint within the history of present illness.  Patient denies fevers or chills.  Assessment & Plan: Visit Diagnoses:  1. 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  This will be a difficult surgery for her to get over.  The chronic leukemia and treatment of that also is a mitigating factor.  Patient is 4 foot 11 and so component position and planning will  be paramount.  With the use of models and diagrams the concept of reverse shoulder replacement is discussed.  The risk and benefits including but not limited to infection instability nerve vessel damage along with incomplete pain relief and incomplete restoration of function are all discussed.  Plan at this time is for [place] to decide if it is better for her to try to squeeze the send during the holiday season versus waiting until neck summer.  She has been functioning marginally well for the past 8 months.  She will let us  know what her preferences.  Until then we will hold off on ordering the thin cut CT scan.  Follow-Up Instructions: No follow-ups on file.   Orders:  No orders of the defined types were placed in this encounter.  No orders of the defined types were placed in this encounter.     Procedures: No procedures performed   Clinical Data: No additional findings.  Objective: Vital Signs: LMP 12/01/2016   Physical Exam:  Constitutional: Patient appears well-developed HEENT:  Head: Normocephalic Eyes:EOM are normal Neck: Normal range of motion Cardiovascular: Normal rate Pulmonary/chest: Effort normal Neurologic: Patient is alert Skin: Skin is warm Psychiatric: Patient has normal mood and affect  Ortho Exam: Ortho  exam demonstrates mild crepitus with passive range of motion on the right-hand side.  She does have external rotation and forward flexion weakness on the right compared to the left of 4 out of 5 compared to 5 out of 5.  Subscap strength is 5- out of 5 on the right compared to the left.  She does have active forward flexion abduction to about 90 degrees but anything higher than that requires assistance with her left hand.  Deltoid does fire.  Motor or sensory function of the hand is intact.  Cervical spine range of motion intact.  Specialty Comments:  No specialty comments available.  Imaging: No results found.   PMFS History: Patient Active Problem List    Diagnosis Date Noted   Closed fracture of proximal end of right humerus 08/11/2023   Chronic myeloid leukemia (CML), BCR/ABL-positive (HCC) 08/06/2023   Essential hypertension 04/04/2023   TIA (transient ischemic attack) 08/30/2022   Stroke (HCC) 07/06/2022   Complex sleep apnea syndrome 08/08/2021   Encounter for BiPAP use counseling 08/08/2021   Mass of upper inner quadrant of left breast 07/19/2021   Vaginal atrophy 07/19/2021   Family history of breast cancer 07/19/2021   OSA on CPAP 04/19/2020   CPAP use counseling 04/19/2020   Nephrolithiasis 01/26/2017   Left ureteral stone 01/03/2017   Endometrial disorder 07/08/2015   Bilateral occipital neuralgia 03/02/2015   DDD (degenerative disc disease), lumbar 12/24/2014   Facet syndrome, lumbar 12/24/2014   Sacroiliac joint dysfunction 12/24/2014   Past Medical History:  Diagnosis Date   Allergy    Anomaly, uterus    tumor of uterus   Anxiety    Bilateral occipital neuralgia    Bipolar 1 disorder (HCC)    Chronic back pain    Complication of anesthesia    hard time waking up after gastric bypass.Pt stated that she has a small mouth and has had several surgeries since gastric bypass with no problems.   Degenerative disc disease, lumbar    Depression    Facet syndrome, lumbar    Family history of breast cancer    2/21 cancer genetic testing letter sent   GERD (gastroesophageal reflux disease)    before gastric bypass   Headache    Migraines   History of kidney stones    Left   Hx of vertigo    Patella fracture    Left   PTSD (post-traumatic stress disorder)    Sleep apnea    C-PAP   Wears glasses     Family History  Problem Relation Age of Onset   Hypertension Mother    Diabetes Mother    Heart disease Father    Alcohol abuse Father    Breast cancer Maternal Grandmother        not sure of age   Breast cancer Other    Breast cancer Other    Kidney cancer Neg Hx    Bladder Cancer Neg Hx     Past Surgical  History:  Procedure Laterality Date   ABDOMINOPLASTY  2006   CARPAL TUNNEL RELEASE Left    CHOLECYSTECTOMY     COLONOSCOPY W/ POLYPECTOMY     CYSTOSCOPY W/ URETERAL STENT PLACEMENT  01/03/2017   Procedure: CYSTOSCOPY WITH RETROGRADE PYELOGRAM/URETERAL STENT PLACEMENT;  Surgeon: Chauncey Redell Agent, MD;  Location: [facility];  Service: Urology;;   CYSTOSCOPY W/ URETERAL STENT PLACEMENT Left 01/17/2017   Procedure: CYSTOSCOPY WITH STENT REPLACEMENT;  Surgeon: Chauncey Redell Agent, MD;  Location: [facility];  Service:  Urology;  Laterality: Left;   DILATATION & CURETTAGE/HYSTEROSCOPY WITH MYOSURE N/A 07/08/2015   Procedure: DILATATION & CURETTAGE/HYSTEROSCOPY WITH MYOSURE;  Surgeon: Lamar SHAUNNA Lesches, MD;  Location: [facility];  Service: Gynecology;  Laterality: N/A;   DILATION AND CURETTAGE OF UTERUS     1/17   EXTRACORPOREAL SHOCK WAVE LITHOTRIPSY Left 12/07/2016   Procedure: EXTRACORPOREAL SHOCK WAVE LITHOTRIPSY (ESWL);  Surgeon: Chauncey Redell Agent, MD;  Location: [facility];  Service: Urology;  Laterality: Left;   GASTRIC BYPASS  2005   KNEE SURGERY Right    ACL- repair   LITHOTRIPSY     MIDDLE EAR SURGERY     NASAL SEPTUM SURGERY     OVARY SURGERY Right    ROTATOR CUFF REPAIR     URETEROSCOPY WITH HOLMIUM LASER LITHOTRIPSY     perforated ureter. had nephrostomy tube for brief time   URETEROSCOPY WITH HOLMIUM LASER LITHOTRIPSY Left 01/17/2017   Procedure: URETEROSCOPY WITH HOLMIUM LASER LITHOTRIPSY;  Surgeon: Chauncey Redell Agent, MD;  Location: [facility];  Service: Urology;  Laterality: Left;   Social History   Occupational History   Not on file  Tobacco Use   Smoking status: Never   Smokeless tobacco: Never  Vaping Use   Vaping status: Never Used  Substance and Sexual Activity   Alcohol use: No   Drug use: No   Sexual activity: Not Currently    Birth control/protection: None

## 2024-04-14 ENCOUNTER — Encounter: Payer: Self-pay | Admitting: Radiology

## 2024-04-16 ENCOUNTER — Other Ambulatory Visit: Payer: Self-pay

## 2024-04-16 DIAGNOSIS — M19011 Primary osteoarthritis, right shoulder: Secondary | ICD-10-CM

## 2024-04-17 ENCOUNTER — Encounter: Payer: Self-pay | Admitting: Orthopedic Surgery

## 2024-04-22 ENCOUNTER — Ambulatory Visit
Admission: RE | Admit: 2024-04-22 | Discharge: 2024-04-22 | Disposition: A | Source: Ambulatory Visit | Attending: Orthopedic Surgery | Admitting: Orthopedic Surgery

## 2024-04-22 DIAGNOSIS — M19011 Primary osteoarthritis, right shoulder: Secondary | ICD-10-CM

## 2024-04-24 ENCOUNTER — Ambulatory Visit: Payer: Self-pay | Admitting: Orthopedic Surgery

## 2024-04-24 NOTE — Progress Notes (Signed)
 Can you call her at some point and see if she wants to get the shoulder fixed now or later.  Thanks

## 2024-04-30 ENCOUNTER — Other Ambulatory Visit (HOSPITAL_COMMUNITY): Payer: Self-pay | Admitting: Physician Assistant

## 2024-04-30 DIAGNOSIS — W1800XA Striking against unspecified object with subsequent fall, initial encounter: Secondary | ICD-10-CM

## 2024-04-30 NOTE — Telephone Encounter (Signed)
 Message sent

## 2024-05-02 NOTE — Telephone Encounter (Signed)
 I think this patient is messaging you and it got to me somehow

## 2024-05-13 NOTE — Telephone Encounter (Signed)
 Patient has a question for you Tammy.

## 2024-05-14 ENCOUNTER — Encounter (HOSPITAL_COMMUNITY): Payer: Self-pay | Admitting: Radiology

## 2024-05-15 ENCOUNTER — Encounter (HOSPITAL_COMMUNITY): Payer: Self-pay

## 2024-05-15 NOTE — Progress Notes (Signed)
 PCP - Dr Leita Alder Cardiologist - Dr Marsa Dooms Hematology & Oncology - Dr Armida Pouch Pulmonology - Dr Elfreda Bathe  Chest x-ray - n/a EKG - 05/16/24 Stress Test - 2005 ECHO - 09/28/22 Cardiac Cath - n/a  ICD Pacemaker/Loop - n/a  Sleep Study -  Yes BiPAP - uses nightly  Diabetes - n/a  Aspirin  & Blood Thinner Instructions:  n/a.  ERAS - clear liquids til 11:30 AM DOS PRE-SURGERY Ensure drink given with instructions.  Anesthesia review: Yes  STOP now taking any Aspirin  (unless otherwise instructed by your surgeon), Aleve, Naproxen, Ibuprofen, Motrin, Advil, Goody's, BC's, all herbal medications, fish oil, and all vitamins.   Coronavirus Screening Do you have any of the following symptoms:  Cough yes/no: No Fever (>100.23F)  yes/no: No Runny nose yes/no: No Sore throat yes/no: No Difficulty breathing/shortness of breath  yes/no: No  Have you traveled in the last 14 days and where? yes/no: No  Patient verbalized understanding of instructions that were given to them at the PAT appointment. Patient was also instructed that they will need to review over the PAT instructions again at home before surgery.

## 2024-05-15 NOTE — Progress Notes (Signed)
 Surgical Instructions   Your procedure is scheduled on Tuesday, 05/20/24. Report to [facility]tr[facility] A at 12:30 PM, then check in with the Admitting office. Any questions or running late day of surgery: call [phone]  Questions prior to your surgery date: call [phone], Monday-Friday, 8am-4pm. If you experience any cold or flu symptoms such as cough, fever, chills, shortness of breath, etc. between now and your scheduled surgery, please notify us  at the above number.     Remember:  Do not eat after midnight the night before your surgery  You may drink clear liquids until 11:30 AM, the morning of your surgery.   Clear liquids allowed are: Water, Non-Citrus Juices (without pulp), Carbonated Beverages, Clear Tea (no milk, honey, etc.), Black Coffee Only (NO MILK, CREAM OR POWDERED CREAMER of any kind), and Gatorade.    Take these medicines the morning of surgery with A SIP OF WATER: dasatinib [facility])     May take these medicines IF NEEDED: ondansetron  (ZOFRAN -ODT) oxyCODONE -acetaminophen  (PERCOCET)      Please follow these instructions carefully:  BENZOYL PEROXIDE 5% GEL   Please do not use if you have an allergy to benzoyl peroxide.   If your skin becomes reddened/irritated stop using the benzoyl peroxide.   Starting two days before surgery, apply as follows:   Apply benzoyl peroxide in the morning and at night. Apply after taking a shower. If you are not taking a shower clean entire shoulder front, back, and side along with the armpit with a clean wet washcloth.    Place a quarter-sized dollop on your shoulder and rub in thoroughly, making sure to cover the front, back, and side of your shoulder, along with the armpit.      2 days before ____ AM   ____ PM  (Sun 12/7)           1 day before ____ AM   ____ PM   (Mon 12/8)                           Do this twice a day for two days.  (Last application is the night before surgery, AFTER using the CHG soap as  described below).   Do NOT apply benzoyl peroxide gel on the day of surgery.    One week prior to surgery, STOP taking any Aspirin (unless otherwise instructed by your surgeon) Aleve, Naproxen, Ibuprofen, Motrin, Advil, Goody's, BC's, all herbal medications, fish oil, and non-prescription vitamins.                     Do NOT Smoke (Tobacco/Vaping) for 24 hours prior to your procedure.  If you use a CPAP at night, you may bring your mask/headgear for your overnight stay.   You will be asked to remove any contacts, glasses, piercing's, hearing aid's, dentures/partials prior to surgery. Please bring cases for these items if needed.    Patients discharged the day of surgery will not be allowed to drive home, and someone needs to stay with them for 24 hours.  SURGICAL WAITING ROOM VISITATION Patients may have no more than 2 support people in the waiting area - these visitors may rotate.   Pre-op nurse will coordinate an appropriate time for 1 ADULT support person, who may not rotate, to accompany patient in pre-op.  Children under the age of 102 must have an adult with them who is not the patient and must remain in the main  waiting area with an adult.  If the patient needs to stay at the hospital during part of their recovery, the visitor guidelines for inpatient rooms apply.  Please refer to the [facility] website for the visitor guidelines for any additional information.   If you received a COVID test during your pre-op visit  it is requested that you wear a mask when out in public, stay away from anyone that may not be feeling well and notify your surgeon if you develop symptoms. If you have been in contact with anyone that has tested positive in the last 10 days please notify you surgeon.      Pre-operative 4 CHG Bathing Instructions   You can play a key role in reducing the risk of infection after surgery. Your skin needs to be as free of germs as possible. You can reduce the number  of germs on your skin by washing with CHG (chlorhexidine  gluconate) soap before surgery. CHG is an antiseptic soap that kills germs and continues to kill germs even after washing.   DO NOT use if you have an allergy to chlorhexidine /CHG or antibacterial soaps. If your skin becomes reddened or irritated, stop using the CHG and notify one of our RNs at [phone].   Please shower with the CHG soap starting 4 days before surgery using the following schedule:     Please keep in mind the following:  DO NOT shave, including legs and underarms, starting the day of your first shower.   You may shave your face at any point before/day of surgery.  Place clean sheets on your bed the day you start using CHG soap. Use a clean washcloth (not used since being washed) for each shower. DO NOT sleep with pets once you start using the CHG.   CHG Shower Instructions:  Wash your face and private area with normal soap. If you choose to wash your hair, wash first with your normal shampoo.  After you use shampoo/soap, rinse your hair and body thoroughly to remove shampoo/soap residue.  Turn the water OFF and apply  bottle of CHG soap to a CLEAN washcloth.  Apply CHG soap ONLY FROM YOUR NECK DOWN TO YOUR TOES (washing for 3-5 minutes)  DO NOT use CHG soap on face, private areas, open wounds, or sores.  Pay special attention to the area where your surgery is being performed.  If you are having back surgery, having someone wash your back for you may be helpful. Wait 2 minutes after CHG soap is applied, then you may rinse off the CHG soap.  Pat dry with a clean towel  Put on clean clothes/pajamas   If you choose to wear lotion, please use ONLY the CHG-compatible lotions that are listed below.  Additional instructions for the day of surgery:  If you choose, you may shower the morning of surgery with an antibacterial soap.  DO NOT APPLY any lotions, deodorants, cologne, or perfumes.   Do not bring valuables to  the hospital. [facility] is not responsible for any belongings/valuables. Do not wear nail polish, gel polish, artificial nails, or any other type of covering on natural nails (fingers and toes) Do not wear jewelry or makeup Put on clean/comfortable clothes.  Please brush your teeth.  Ask your nurse before applying any prescription medications to the skin.     CHG Compatible Lotions   Aveeno Moisturizing lotion  Cetaphil Moisturizing Cream  Cetaphil Moisturizing Lotion  Clairol Herbal Essence Moisturizing Lotion, Dry Skin  Clairol  Herbal Essence Moisturizing Lotion, Extra Dry Skin  Clairol Herbal Essence Moisturizing Lotion, Normal Skin  Curel Age Defying Therapeutic Moisturizing Lotion with Alpha Hydroxy  Curel Extreme Care Body Lotion  Curel Soothing Hands Moisturizing Hand Lotion  Curel Therapeutic Moisturizing Cream, Fragrance-Free  Curel Therapeutic Moisturizing Lotion, Fragrance-Free  Curel Therapeutic Moisturizing Lotion, Original Formula  Eucerin Daily Replenishing Lotion  Eucerin Dry Skin Therapy Plus Alpha Hydroxy Crme  Eucerin Dry Skin Therapy Plus Alpha Hydroxy Lotion  Eucerin Original Crme  Eucerin Original Lotion  Eucerin Plus Crme Eucerin Plus Lotion  Eucerin TriLipid Replenishing Lotion  Keri Anti-Bacterial Hand Lotion  Keri Deep Conditioning Original Lotion Dry Skin Formula Softly Scented  Keri Deep Conditioning Original Lotion, Fragrance Free Sensitive Skin Formula  Keri Lotion Fast Absorbing Fragrance Free Sensitive Skin Formula  Keri Lotion Fast Absorbing Softly Scented Dry Skin Formula  Keri Original Lotion  Keri Skin Renewal Lotion Keri Silky Smooth Lotion  Keri Silky Smooth Sensitive Skin Lotion  Nivea Body Creamy Conditioning Oil  Nivea Body Extra Enriched Lotion  Nivea Body Original Lotion  Nivea Body Sheer Moisturizing Lotion Nivea Crme  Nivea Skin Firming Lotion  NutraDerm 30 Skin Lotion  NutraDerm Skin Lotion  NutraDerm Therapeutic Skin  Cream  NutraDerm Therapeutic Skin Lotion  ProShield Protective Hand Cream  Provon moisturizing lotion  Please read over the following fact sheets that you were given.

## 2024-05-16 ENCOUNTER — Encounter (HOSPITAL_COMMUNITY): Payer: Self-pay

## 2024-05-16 ENCOUNTER — Inpatient Hospital Stay (HOSPITAL_COMMUNITY): Admission: RE | Admit: 2024-05-16 | Discharge: 2024-05-16 | Attending: Orthopedic Surgery

## 2024-05-16 ENCOUNTER — Other Ambulatory Visit: Payer: Self-pay

## 2024-05-16 VITALS — BP 135/79 | HR 86 | Temp 98.1°F | Resp 18 | Ht 59.0 in | Wt 173.3 lb

## 2024-05-16 DIAGNOSIS — F319 Bipolar disorder, unspecified: Secondary | ICD-10-CM | POA: Insufficient documentation

## 2024-05-16 DIAGNOSIS — I10 Essential (primary) hypertension: Secondary | ICD-10-CM | POA: Insufficient documentation

## 2024-05-16 DIAGNOSIS — Y838 Other surgical procedures as the cause of abnormal reaction of the patient, or of later complication, without mention of misadventure at the time of the procedure: Secondary | ICD-10-CM | POA: Insufficient documentation

## 2024-05-16 DIAGNOSIS — G4733 Obstructive sleep apnea (adult) (pediatric): Secondary | ICD-10-CM | POA: Insufficient documentation

## 2024-05-16 DIAGNOSIS — Z01818 Encounter for other preprocedural examination: Secondary | ICD-10-CM | POA: Diagnosis present

## 2024-05-16 DIAGNOSIS — M19011 Primary osteoarthritis, right shoulder: Secondary | ICD-10-CM | POA: Insufficient documentation

## 2024-05-16 DIAGNOSIS — S4291XA Fracture of right shoulder girdle, part unspecified, initial encounter for closed fracture: Secondary | ICD-10-CM | POA: Insufficient documentation

## 2024-05-16 DIAGNOSIS — C9210 Chronic myeloid leukemia, BCR/ABL-positive, not having achieved remission: Secondary | ICD-10-CM | POA: Insufficient documentation

## 2024-05-16 DIAGNOSIS — M549 Dorsalgia, unspecified: Secondary | ICD-10-CM | POA: Insufficient documentation

## 2024-05-16 DIAGNOSIS — G8929 Other chronic pain: Secondary | ICD-10-CM | POA: Insufficient documentation

## 2024-05-16 HISTORY — DX: Unspecified hearing loss, unspecified ear: H91.90

## 2024-05-16 HISTORY — DX: Essential (primary) hypertension: I10

## 2024-05-16 HISTORY — DX: Unspecified osteoarthritis, unspecified site: M19.90

## 2024-05-16 LAB — URINALYSIS, W/ REFLEX TO CULTURE (INFECTION SUSPECTED)
Bilirubin Urine: NEGATIVE
Glucose, UA: NEGATIVE mg/dL
Hgb urine dipstick: NEGATIVE
Ketones, ur: NEGATIVE mg/dL
Leukocytes,Ua: NEGATIVE
Nitrite: NEGATIVE
Protein, ur: 100 mg/dL — AB
Specific Gravity, Urine: >1.03 — ABNORMAL HIGH (ref 1.005–1.030)
pH: 6 (ref 5.0–8.0)

## 2024-05-16 LAB — CBC
HCT: 35.9 % — ABNORMAL LOW (ref 36.0–46.0)
Hemoglobin: 11.8 g/dL — ABNORMAL LOW (ref 12.0–15.0)
MCH: 34.5 pg — ABNORMAL HIGH (ref 26.0–34.0)
MCHC: 32.9 g/dL (ref 30.0–36.0)
MCV: 105 fL — ABNORMAL HIGH (ref 80.0–100.0)
Platelets: 110 K/uL — ABNORMAL LOW (ref 150–400)
RBC: 3.42 MIL/uL — ABNORMAL LOW (ref 3.87–5.11)
RDW: 15.3 % (ref 11.5–15.5)
WBC: 5.1 K/uL (ref 4.0–10.5)
nRBC: 0 % (ref 0.0–0.2)

## 2024-05-16 LAB — PREALBUMIN: Prealbumin: 26 mg/dL (ref 18–38)

## 2024-05-16 LAB — BASIC METABOLIC PANEL WITH GFR
Anion gap: 7 (ref 5–15)
BUN: 13 mg/dL (ref 6–20)
CO2: 27 mmol/L (ref 22–32)
Calcium: 8.7 mg/dL — ABNORMAL LOW (ref 8.9–10.3)
Chloride: 107 mmol/L (ref 98–111)
Creatinine, Ser: 0.69 mg/dL (ref 0.44–1.00)
GFR, Estimated: >60 mL/min (ref >60)
Glucose, Bld: 96 mg/dL (ref 70–99)
Potassium: 3.3 mmol/L — ABNORMAL LOW (ref 3.5–5.1)
Sodium: 141 mmol/L (ref 135–145)

## 2024-05-16 LAB — TYPE AND SCREEN
ABO/RH(D): A POS
Antibody Screen: NEGATIVE

## 2024-05-16 LAB — SURGICAL PCR SCREEN
MRSA, PCR: NEGATIVE
Staphylococcus aureus: NEGATIVE

## 2024-05-16 LAB — VITAMIN D 25 HYDROXY (VIT D DEFICIENCY, FRACTURES): Vit D, 25-Hydroxy: 34.13 ng/mL (ref 30–100)

## 2024-05-16 NOTE — Anesthesia Preprocedure Evaluation (Addendum)
 Anesthesia Evaluation  Patient identified by MRN, date of birth, ID band Patient awake    Reviewed: Allergy & Precautions, NPO status , Patient's Chart, lab work & pertinent test results  History of Anesthesia Complications Negative for: history of anesthetic complications  Airway Mallampati: II  TM Distance: >3 FB Neck ROM: Full    Dental no notable dental hx.    Pulmonary sleep apnea    Pulmonary exam normal        Cardiovascular hypertension, Normal cardiovascular exam     Neuro/Psych  Headaches  Anxiety Depression Bipolar Disorder   CVA, No Residual Symptoms    GI/Hepatic ,GERD  Controlled,,  Endo/Other    Renal/GU      Musculoskeletal  (+) Arthritis ,    Abdominal   Peds  Hematology   Anesthesia Other Findings   Reproductive/Obstetrics                              Anesthesia Physical Anesthesia Plan  ASA: 2  Anesthesia Plan: General   Post-op Pain Management: Tylenol  PO (pre-op)* and Regional block*   Induction: Intravenous  PONV Risk Score and Plan: 3 and Treatment may vary due to age or medical condition, Ondansetron , Dexamethasone  and Midazolam   Airway Management Planned: Oral ETT  Additional Equipment: None  Intra-op Plan:   Post-operative Plan: Extubation in OR  Informed Consent: I have reviewed the patients History and Physical, chart, labs and discussed the procedure including the risks, benefits and alternatives for the proposed anesthesia with the patient or authorized representative who has indicated his/her understanding and acceptance.     Dental advisory given  Plan Discussed with: CRNA  Anesthesia Plan Comments: (PAT note written 05/16/2024 by Allison Zelenak, PA-C.  )         Anesthesia Quick Evaluation

## 2024-05-16 NOTE — Progress Notes (Signed)
 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   History includes never smoker, Bipolar 1 disorder, CML, anemia, HTN, TIA (versus complex migraine 05/19/2022), PTSD, GERD, OSA (uses BiPAP), occipital neuralgia, chronic back pain, anxiety, rotator cuff tear (s/p left repair 09/02/2019), left hearing aid, gastric bypass (2005).   Last evaluation by cardiologist Dr. Ammon on 10/10/2023. Initially established in March 2024 for Holter monitor and event monitor as part of TIA versus complex migraine evaluation. 14-day Holter monitor 08/30/2022-/08/2022 revealed predominant sinus rhythm with mean heart rate of 94 bpm, sinus heart rate range 33 to 142 bpm, rare premature atrial contractions and premature ventricular contractions, without evidence for atrial fibrillation.  2D echocardiogram 09/28/2022 revealed normal left ventricular function, with LVEF greater than 55%, with mild mitral and tricuspid regurgitation. She is primarily followed for HTN. No medication changes made at last visit.    Last HEM-ONC follow-up with Dr. Trudy was on 03/11/2024 for CML. Dasatinib 100mg  daily - started 09/02/23, stopped 6/6 for thrombocytopenia, back on ~ 7/20. Lab thresholds to continue: ANA > 0.7, plts > 50 goals.  Noted that she may required additional right shoulder surgery, so would need to monitor platelet counts.  PLT count 110K on 05/16/2024.  Anesthesia team to evaluate on the day of surgery.   VS: BP 135/79   Pulse 86   Temp 36.7 C (Oral)   Resp 18   Ht 4' 11 (1.499 m)   Wt 78.6 kg   LMP  12/01/2016   SpO2 100%   BMI 35.00 kg/m    PROVIDERS: Derick Leita POUR, MD is PCP Trudy Armida Hamilton, MD is HEM-ONC Fernand Rima, MD is pulmonologist (for OSA), last visit 01/14/2024. Paraschos, Alexander, MD is cardiologist. Followed for HTN and had echo and Holter monitor in 2024 as part of TIA vs complex migraine work-up.   LABS: Labs reviewed: Acceptable for surgery. (all labs ordered are listed, but only abnormal results are displayed)  Labs Reviewed  CBC - Abnormal; Notable for the following components:      Result Value   RBC 3.42 (*)    Hemoglobin 11.8 (*)    HCT 35.9 (*)    MCV 105.0 (*)    MCH 34.5 (*)    Platelets 110 (*)    All other components within normal limits  BASIC METABOLIC PANEL WITH GFR - Abnormal; Notable for the following components:   Potassium 3.3 (*)    Calcium 8.7 (*)    All other components within normal limits  URINALYSIS, W/ REFLEX TO CULTURE (INFECTION SUSPECTED) - Abnormal; Notable for the following components:   Specific Gravity, Urine >1.030 (*)    Protein, ur 100 (*)    Bacteria, UA RARE (*)    All other components within normal limits  SURGICAL PCR SCREEN  PREALBUMIN  VITAMIN D  25 HYDROXY (VIT D DEFICIENCY, FRACTURES)  TYPE AND SCREEN     IMAGES: CT Right Shoulder 04/22/2024: IMPRESSION: 1. Ununited comminuted mildly displaced and impacted fracture of the right proximal humeral surgical neck extending into the greater tuberosity without significant callus formation  or osseous bridging. 2. Mild glenohumeral osteoarthritis.  MRI Brain 05/20/2022: MPRESSION: No acute intracranial process. No evidence of acute or subacute infarct.   EKG: 05/16/2024: Normal sinus rhythm  Moderate voltage criteria for LVH, may be normal variant ( R in aVL , Cornell product )  Septal infarct , age undetermined  Abnormal ECG  When compared with ECG of 19-May-2022 15:20, No significant change was found  Confirmed by Nancey Scotts [phone]) on  05/16/2024 1:09:39 PM    CV: Echo 09/28/2022 ([facility]): INTERPRETATION  NORMAL LEFT VENTRICULAR SYSTOLIC FUNCTION  NORMAL RIGHT VENTRICULAR SYSTOLIC FUNCTION  MILD VALVULAR REGURGITATION (trivial AR, mild MR, mild TR, trivial PR)  NO VALVULAR STENOSIS  GLS: -16.1 %    Holter monitor 08/30/2022 - 09/13/2022 (as outlined by Dr. Ammon): predominant sinus rhythm with mean heart rate of 94 bpm, sinus heart rate range 33 to 142 bpm, rare premature atrial contractions and premature ventricular contractions, without evidence for atrial fibrillation.   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   DDD (degenerative disc disease), lumbar 12/24/2014   Depression    Facet syndrome, lumbar    Family history of breast cancer    2/21 cancer genetic testing letter sent   GERD (gastroesophageal reflux disease)    before gastric bypass   Headache    Migraines   Hearing loss    left ear - has hearing aid   History of kidney stones    Left   Hx of vertigo    Hypertension    Patella fracture    Left   PTSD (post-traumatic stress disorder)    Sleep apnea 08/08/2021   BiPAP - uses nightly   Stroke (HCC) 07/06/2022   TIA on 08/30/22   Wears glasses     Past Surgical History:  Procedure Laterality Date   ABDOMINOPLASTY  2006   CARPAL TUNNEL RELEASE Left    CHOLECYSTECTOMY  2008   COLONOSCOPY W/ POLYPECTOMY     CYSTOSCOPY W/ URETERAL STENT PLACEMENT  01/03/2017   Procedure:  CYSTOSCOPY WITH RETROGRADE PYELOGRAM/URETERAL STENT PLACEMENT;  Surgeon: Chauncey Redell Agent, MD;  Location: [facility] ORS;  Service: Urology;;   CYSTOSCOPY W/ URETERAL STENT PLACEMENT Left 01/17/2017   Procedure: CYSTOSCOPY WITH STENT REPLACEMENT;  Surgeon: Chauncey Redell Agent, MD;  Location: [facility] ORS;  Service: Urology;  Laterality: Left;   DILATATION & CURETTAGE/HYSTEROSCOPY WITH MYOSURE N/A 07/08/2015   Procedure: DILATATION & CURETTAGE/HYSTEROSCOPY WITH MYOSURE;  Surgeon: Lamar SHAUNNA Lesches, MD;  Location: [facility] ORS;  Service: Gynecology;  Laterality: N/A;   DILATION AND CURETTAGE OF UTERUS     1/17   EXTRACORPOREAL SHOCK WAVE LITHOTRIPSY Left 12/07/2016   Procedure: EXTRACORPOREAL SHOCK WAVE LITHOTRIPSY (ESWL);  Surgeon: Chauncey Redell Agent, MD;  Location: [facility] ORS;  Service: Urology;  Laterality: Left;   GASTRIC BYPASS  2005   KNEE SURGERY Right    ACL-  repair   LITHOTRIPSY     MIDDLE EAR SURGERY     NASAL SEPTUM SURGERY     OVARY SURGERY Right    ROTATOR CUFF REPAIR Right 2023   [facility]   UPPER GI ENDOSCOPY     URETEROSCOPY WITH HOLMIUM LASER LITHOTRIPSY     perforated ureter. had nephrostomy tube for brief time   URETEROSCOPY WITH HOLMIUM LASER LITHOTRIPSY Left 01/17/2017   Procedure: URETEROSCOPY WITH HOLMIUM LASER LITHOTRIPSY;  Surgeon: Chauncey Redell Agent, MD;  Location: [facility] ORS;  Service: Urology;  Laterality: Left;    MEDICATIONS:  dasatinib (SPRYCEL) 80 MG tablet   estradiol  (ESTRACE ) 0.1 MG/GM vaginal cream   lamoTRIgine  (LAMICTAL ) 200 MG tablet   ondansetron  (ZOFRAN -ODT) 8 MG disintegrating tablet   oxyCODONE -acetaminophen  (PERCOCET) 10-325 MG tablet   No current facility-administered medications for this encounter.    Isaiah Ruder, PA-C Surgical Short Stay/Anesthesiology [facility] Phone [phone] [facility] Phone [phone] 05/16/2024 4:07 PM

## 2024-05-16 NOTE — Progress Notes (Signed)
 Surgical Instructions   Your procedure is scheduled on Tuesday, 05/20/24. Report to [facility] at 12:30 PM, then check in with the Admitting office. Any questions or running late day of surgery: call [phone]  Questions prior to your surgery date: call [phone], Monday-Friday, 8am-4pm. If you experience any cold or flu symptoms such as cough, fever, chills, shortness of breath, etc. between now and your scheduled surgery, please notify us  at the above number.     Remember:  Do not eat after midnight the night before your surgery  You may drink clear liquids until 11:30 AM, the morning of your surgery.   Clear liquids allowed are: Water, Non-Citrus Juices (without pulp), Carbonated Beverages, Clear Tea (no milk, honey, etc.), Black Coffee Only (NO MILK, CREAM OR POWDERED CREAMER of any kind), and Gatorade.    Please complete your PRE-SURGERY ENSURE that was provided to you by 11:30 AM, the morning of surgery.  Please, if able, drink it in one setting. DO NOT SIP.  Take these medicines the morning of surgery with A SIP OF WATER: dasatinib [facility])     May take these medicines IF NEEDED: ondansetron  (ZOFRAN -ODT) oxyCODONE -acetaminophen  (PERCOCET)      Please follow these instructions carefully:  BENZOYL PEROXIDE 5% GEL   Please do not use if you have an allergy to benzoyl peroxide.   If your skin becomes reddened/irritated stop using the benzoyl peroxide.   Starting two days before surgery, apply as follows:   Apply benzoyl peroxide in the morning and at night. Apply after taking a shower. If you are not taking a shower clean entire shoulder front, back, and side along with the armpit with a clean wet washcloth.    Place a quarter-sized dollop on your shoulder and rub in thoroughly, making sure to cover the front, back, and side of your shoulder, along with the armpit.      2 days before ____ AM   ____ PM  (Sun 12/7)           1 day before ____ AM    ____ PM   (Mon 12/8)                           Do this twice a day for two days.  (Last application is the night before surgery, AFTER using the CHG soap as described below).   Do NOT apply benzoyl peroxide gel on the day of surgery.    One week prior to surgery, STOP taking any Aspirin (unless otherwise instructed by your surgeon) Aleve, Naproxen, Ibuprofen, Motrin, Advil, Goody's, BC's, all herbal medications, fish oil, and non-prescription vitamins.                     Do NOT Smoke (Tobacco/Vaping) for 24 hours prior to your procedure.  If you use a CPAP at night, you may bring your mask/headgear for your overnight stay.   You will be asked to remove any contacts, glasses, piercing's, hearing aid's, dentures/partials prior to surgery. Please bring cases for these items if needed.    Patients discharged the day of surgery will not be allowed to drive home, and someone needs to stay with them for 24 hours.  SURGICAL WAITING ROOM VISITATION Patients may have no more than 2 support people in the waiting area - these visitors may rotate.   Pre-op nurse will coordinate an appropriate time for 1 ADULT support person, who may not  rotate, to accompany patient in pre-op.  Children under the age of 43 must have an adult with them who is not the patient and must remain in the main waiting area with an adult.  If the patient needs to stay at the hospital during part of their recovery, the visitor guidelines for inpatient rooms apply.  Please refer to the [facility] website for the visitor guidelines for any additional information.   If you received a COVID test during your pre-op visit  it is requested that you wear a mask when out in public, stay away from anyone that may not be feeling well and notify your surgeon if you develop symptoms. If you have been in contact with anyone that has tested positive in the last 10 days please notify you surgeon.      Pre-operative 4 CHG Bathing  Instructions   You can play a key role in reducing the risk of infection after surgery. Your skin needs to be as free of germs as possible. You can reduce the number of germs on your skin by washing with CHG (chlorhexidine  gluconate) soap before surgery. CHG is an antiseptic soap that kills germs and continues to kill germs even after washing.   DO NOT use if you have an allergy to chlorhexidine /CHG or antibacterial soaps. If your skin becomes reddened or irritated, stop using the CHG and notify one of our RNs at [phone].   Please shower with the CHG soap starting 4 days before surgery using the following schedule:     Please keep in mind the following:  DO NOT shave, including legs and underarms, starting the day of your first shower.   You may shave your face at any point before/day of surgery.  Place clean sheets on your bed the day you start using CHG soap. Use a clean washcloth (not used since being washed) for each shower. DO NOT sleep with pets once you start using the CHG.   CHG Shower Instructions:  Wash your face and private area with normal soap. If you choose to wash your hair, wash first with your normal shampoo.  After you use shampoo/soap, rinse your hair and body thoroughly to remove shampoo/soap residue.  Turn the water OFF and apply  bottle of CHG soap to a CLEAN washcloth.  Apply CHG soap ONLY FROM YOUR NECK DOWN TO YOUR TOES (washing for 3-5 minutes)  DO NOT use CHG soap on face, private areas, open wounds, or sores.  Pay special attention to the area where your surgery is being performed.  If you are having back surgery, having someone wash your back for you may be helpful. Wait 2 minutes after CHG soap is applied, then you may rinse off the CHG soap.  Pat dry with a clean towel  Put on clean clothes/pajamas   If you choose to wear lotion, please use ONLY the CHG-compatible lotions that are listed below.  Additional instructions for the day of surgery:  If  you choose, you may shower the morning of surgery with an antibacterial soap.  DO NOT APPLY any lotions, deodorants, cologne, or perfumes.   Do not bring valuables to the hospital. [facility] is not responsible for any belongings/valuables. Do not wear nail polish, gel polish, artificial nails, or any other type of covering on natural nails (fingers and toes) Do not wear jewelry or makeup Put on clean/comfortable clothes.  Please brush your teeth.  Ask your nurse before applying any prescription medications to the skin.  CHG Compatible Lotions   Aveeno Moisturizing lotion  Cetaphil Moisturizing Cream  Cetaphil Moisturizing Lotion  Clairol Herbal Essence Moisturizing Lotion, Dry Skin  Clairol Herbal Essence Moisturizing Lotion, Extra Dry Skin  Clairol Herbal Essence Moisturizing Lotion, Normal Skin  Curel Age Defying Therapeutic Moisturizing Lotion with Alpha Hydroxy  Curel Extreme Care Body Lotion  Curel Soothing Hands Moisturizing Hand Lotion  Curel Therapeutic Moisturizing Cream, Fragrance-Free  Curel Therapeutic Moisturizing Lotion, Fragrance-Free  Curel Therapeutic Moisturizing Lotion, Original Formula  Eucerin Daily Replenishing Lotion  Eucerin Dry Skin Therapy Plus Alpha Hydroxy Crme  Eucerin Dry Skin Therapy Plus Alpha Hydroxy Lotion  Eucerin Original Crme  Eucerin Original Lotion  Eucerin Plus Crme Eucerin Plus Lotion  Eucerin TriLipid Replenishing Lotion  Keri Anti-Bacterial Hand Lotion  Keri Deep Conditioning Original Lotion Dry Skin Formula Softly Scented  Keri Deep Conditioning Original Lotion, Fragrance Free Sensitive Skin Formula  Keri Lotion Fast Absorbing Fragrance Free Sensitive Skin Formula  Keri Lotion Fast Absorbing Softly Scented Dry Skin Formula  Keri Original Lotion  Keri Skin Renewal Lotion Keri Silky Smooth Lotion  Keri Silky Smooth Sensitive Skin Lotion  Nivea Body Creamy Conditioning Oil  Nivea Body Extra Enriched Lotion  Nivea Body  Original Lotion  Nivea Body Sheer Moisturizing Lotion Nivea Crme  Nivea Skin Firming Lotion  NutraDerm 30 Skin Lotion  NutraDerm Skin Lotion  NutraDerm Therapeutic Skin Cream  NutraDerm Therapeutic Skin Lotion  ProShield Protective Hand Cream  Provon moisturizing lotion  Please read over the following fact sheets that you were given.

## 2024-05-19 ENCOUNTER — Other Ambulatory Visit: Payer: Self-pay

## 2024-05-20 ENCOUNTER — Encounter (HOSPITAL_COMMUNITY): Admission: RE | Disposition: A | Payer: Self-pay | Source: Home / Self Care | Attending: Orthopedic Surgery

## 2024-05-20 ENCOUNTER — Observation Stay (HOSPITAL_COMMUNITY)

## 2024-05-20 ENCOUNTER — Ambulatory Visit (HOSPITAL_COMMUNITY): Payer: Self-pay | Admitting: Anesthesiology

## 2024-05-20 ENCOUNTER — Ambulatory Visit (HOSPITAL_COMMUNITY): Payer: Self-pay | Admitting: Physician Assistant

## 2024-05-20 ENCOUNTER — Encounter (HOSPITAL_COMMUNITY): Payer: Self-pay | Admitting: Orthopedic Surgery

## 2024-05-20 ENCOUNTER — Observation Stay (HOSPITAL_COMMUNITY)
Admission: RE | Admit: 2024-05-20 | Discharge: 2024-05-22 | Disposition: A | Discharge status: Home / Self Care | Attending: Orthopedic Surgery | Admitting: Orthopedic Surgery

## 2024-05-20 ENCOUNTER — Other Ambulatory Visit: Payer: Self-pay

## 2024-05-20 DIAGNOSIS — M19011 Primary osteoarthritis, right shoulder: Secondary | ICD-10-CM | POA: Diagnosis not present

## 2024-05-20 DIAGNOSIS — Z856 Personal history of leukemia: Secondary | ICD-10-CM | POA: Diagnosis not present

## 2024-05-20 DIAGNOSIS — Z79899 Other long term (current) drug therapy: Secondary | ICD-10-CM | POA: Insufficient documentation

## 2024-05-20 DIAGNOSIS — Z8673 Personal history of transient ischemic attack (TIA), and cerebral infarction without residual deficits: Secondary | ICD-10-CM | POA: Diagnosis not present

## 2024-05-20 DIAGNOSIS — I10 Essential (primary) hypertension: Secondary | ICD-10-CM | POA: Insufficient documentation

## 2024-05-20 DIAGNOSIS — Z96611 Presence of right artificial shoulder joint: Secondary | ICD-10-CM

## 2024-05-20 DIAGNOSIS — S4291XK Fracture of right shoulder girdle, part unspecified, subsequent encounter for fracture with nonunion: Secondary | ICD-10-CM | POA: Diagnosis not present

## 2024-05-20 DIAGNOSIS — Z01818 Encounter for other preprocedural examination: Secondary | ICD-10-CM

## 2024-05-20 DIAGNOSIS — M25511 Pain in right shoulder: Secondary | ICD-10-CM | POA: Diagnosis present

## 2024-05-20 HISTORY — PX: REVERSE SHOULDER ARTHROPLASTY: SHX5054

## 2024-05-20 LAB — CBC WITH DIFFERENTIAL/PLATELET
Abs Immature Granulocytes: 0.03 K/uL (ref 0.00–0.07)
Basophils Absolute: 0 K/uL (ref 0.0–0.1)
Basophils Relative: 0 %
Eosinophils Absolute: 0 K/uL (ref 0.0–0.5)
Eosinophils Relative: 0 %
HCT: 28.9 % — ABNORMAL LOW (ref 36.0–46.0)
Hemoglobin: 9.7 g/dL — ABNORMAL LOW (ref 12.0–15.0)
Immature Granulocytes: 0 %
Lymphocytes Relative: 6 %
Lymphs Abs: 0.4 K/uL — ABNORMAL LOW (ref 0.7–4.0)
MCH: 34 pg (ref 26.0–34.0)
MCHC: 33.6 g/dL (ref 30.0–36.0)
MCV: 101.4 fL — ABNORMAL HIGH (ref 80.0–100.0)
Monocytes Absolute: 0.1 K/uL (ref 0.1–1.0)
Monocytes Relative: 2 %
Neutro Abs: 6.4 K/uL (ref 1.7–7.7)
Neutrophils Relative %: 92 %
Platelets: 83 K/uL — ABNORMAL LOW (ref 150–400)
RBC: 2.85 MIL/uL — ABNORMAL LOW (ref 3.87–5.11)
RDW: 14.4 % (ref 11.5–15.5)
Smear Review: NORMAL
WBC: 7 K/uL (ref 4.0–10.5)
nRBC: 0 % (ref 0.0–0.2)

## 2024-05-20 MED ORDER — DEXMEDETOMIDINE HCL IN NACL 80 MCG/20ML IV SOLN
INTRAVENOUS | Status: DC | PRN
  Administered 2024-05-20 (×2): 4 ug via INTRAVENOUS

## 2024-05-20 MED ORDER — FENTANYL CITRATE (PF) 100 MCG/2ML IJ SOLN
INTRAMUSCULAR | Status: AC
  Administered 2024-05-20: 100 ug via INTRAVENOUS
  Filled 2024-05-20: qty 2

## 2024-05-20 MED ORDER — PHENYLEPHRINE HCL-NACL 20-0.9 MG/250ML-% IV SOLN
INTRAVENOUS | Status: DC | PRN
  Administered 2024-05-20: 20 ug/min via INTRAVENOUS

## 2024-05-20 MED ORDER — ROCURONIUM BROMIDE 10 MG/ML (PF) SYRINGE
PREFILLED_SYRINGE | INTRAVENOUS | Status: AC
  Filled 2024-05-20: qty 10

## 2024-05-20 MED ORDER — ONDANSETRON HCL 4 MG PO TABS
4.0000 mg | ORAL_TABLET | Freq: Four times a day (QID) | ORAL | Status: DC | PRN
  Administered 2024-05-21 (×3): 4 mg via ORAL
  Filled 2024-05-20 (×3): qty 1

## 2024-05-20 MED ORDER — EPHEDRINE SULFATE-NACL 50-0.9 MG/10ML-% IV SOSY
PREFILLED_SYRINGE | INTRAVENOUS | Status: DC | PRN
  Administered 2024-05-20: 2.5 mg via INTRAVENOUS
  Administered 2024-05-20: 5 mg via INTRAVENOUS

## 2024-05-20 MED ORDER — ONDANSETRON HCL 4 MG/2ML IJ SOLN
INTRAMUSCULAR | Status: DC | PRN
  Administered 2024-05-20: 4 mg via INTRAVENOUS

## 2024-05-20 MED ORDER — ACETAMINOPHEN 325 MG PO TABS: 325.0000 mg | ORAL_TABLET | Freq: Four times a day (QID) | ORAL | Status: DC | PRN

## 2024-05-20 MED ORDER — CEFAZOLIN SODIUM-DEXTROSE 2-4 GM/100ML-% IV SOLN
2.0000 g | Freq: Three times a day (TID) | INTRAVENOUS | Status: AC
  Administered 2024-05-20 – 2024-05-21 (×3): 2 g via INTRAVENOUS
  Filled 2024-05-20 (×3): qty 100

## 2024-05-20 MED ORDER — PROPOFOL 10 MG/ML IV BOLUS
INTRAVENOUS | Status: DC | PRN
  Administered 2024-05-20: 10 mg via INTRAVENOUS
  Administered 2024-05-20: 150 mg via INTRAVENOUS

## 2024-05-20 MED ORDER — METOCLOPRAMIDE HCL 5 MG/ML IJ SOLN: 5.0000 mg | Freq: Three times a day (TID) | INTRAMUSCULAR | Status: DC | PRN

## 2024-05-20 MED ORDER — FENTANYL CITRATE (PF) 250 MCG/5ML IJ SOLN
INTRAMUSCULAR | Status: DC | PRN
  Administered 2024-05-20: 25 ug via INTRAVENOUS
  Administered 2024-05-20: 50 ug via INTRAVENOUS
  Administered 2024-05-20: 25 ug via INTRAVENOUS

## 2024-05-20 MED ORDER — BUPIVACAINE-EPINEPHRINE (PF) 0.5% -1:200000 IJ SOLN
INTRAMUSCULAR | Status: DC | PRN
  Administered 2024-05-20: 15 mL via PERINEURAL

## 2024-05-20 MED ORDER — DASATINIB 80 MG PO TABS: 80.0000 mg | ORAL_TABLET | Freq: Every morning | ORAL | Status: DC

## 2024-05-20 MED ORDER — POVIDONE-IODINE 7.5 % EX SOLN
Freq: Once | CUTANEOUS | Status: AC
  Filled 2024-05-20: qty 118

## 2024-05-20 MED ORDER — SUGAMMADEX SODIUM 200 MG/2ML IV SOLN
INTRAVENOUS | Status: DC | PRN
  Administered 2024-05-20: 100 mg via INTRAVENOUS

## 2024-05-20 MED ORDER — FENTANYL CITRATE (PF) 100 MCG/2ML IJ SOLN: 100.0000 ug | Freq: Once | INTRAMUSCULAR | Status: AC

## 2024-05-20 MED ORDER — CELECOXIB 100 MG PO CAPS
100.0000 mg | ORAL_CAPSULE | Freq: Two times a day (BID) | ORAL | Status: DC
  Administered 2024-05-20: 100 mg via ORAL
  Filled 2024-05-20 (×3): qty 1

## 2024-05-20 MED ORDER — HYDROMORPHONE HCL 1 MG/ML IJ SOLN
0.5000 mg | INTRAMUSCULAR | Status: DC | PRN
  Administered 2024-05-21 – 2024-05-22 (×2): 0.5 mg via INTRAVENOUS
  Filled 2024-05-20 (×2): qty 0.5

## 2024-05-20 MED ORDER — FENTANYL CITRATE (PF) 100 MCG/2ML IJ SOLN
INTRAMUSCULAR | Status: AC
  Filled 2024-05-20: qty 2

## 2024-05-20 MED ORDER — ACETAMINOPHEN 500 MG PO TABS
1000.0000 mg | ORAL_TABLET | Freq: Four times a day (QID) | ORAL | Status: AC
  Administered 2024-05-20 – 2024-05-21 (×3): 1000 mg via ORAL
  Filled 2024-05-20 (×3): qty 2

## 2024-05-20 MED ORDER — CHLORHEXIDINE GLUCONATE 0.12 % MT SOLN
15.0000 mL | Freq: Once | OROMUCOSAL | Status: AC
  Administered 2024-05-20: 15 mL via OROMUCOSAL
  Filled 2024-05-20: qty 15

## 2024-05-20 MED ORDER — ONDANSETRON HCL 4 MG/2ML IJ SOLN
INTRAMUSCULAR | Status: AC
  Filled 2024-05-20: qty 2

## 2024-05-20 MED ORDER — VANCOMYCIN HCL 1000 MG IV SOLR
INTRAVENOUS | Status: AC
  Filled 2024-05-20: qty 20

## 2024-05-20 MED ORDER — MIDAZOLAM HCL 2 MG/2ML IJ SOLN
INTRAMUSCULAR | Status: AC
  Administered 2024-05-20: 2 mg via INTRAVENOUS
  Filled 2024-05-20: qty 2

## 2024-05-20 MED ORDER — MENTHOL 3 MG MT LOZG: 1.0000 | LOZENGE | OROMUCOSAL | Status: DC | PRN

## 2024-05-20 MED ORDER — 0.9 % SODIUM CHLORIDE (POUR BTL) OPTIME
TOPICAL | Status: DC | PRN
  Administered 2024-05-20: 1000 mL

## 2024-05-20 MED ORDER — ORAL CARE MOUTH RINSE: 15.0000 mL | Freq: Once | OROMUCOSAL | Status: AC

## 2024-05-20 MED ORDER — METHOCARBAMOL 1000 MG/10ML IJ SOLN: 500.0000 mg | Freq: Four times a day (QID) | INTRAMUSCULAR | Status: DC | PRN

## 2024-05-20 MED ORDER — METHOCARBAMOL 500 MG PO TABS
500.0000 mg | ORAL_TABLET | Freq: Four times a day (QID) | ORAL | Status: DC | PRN
  Administered 2024-05-20 – 2024-05-21 (×3): 500 mg via ORAL
  Filled 2024-05-20 (×4): qty 1

## 2024-05-20 MED ORDER — OXYCODONE HCL 5 MG PO TABS
5.0000 mg | ORAL_TABLET | ORAL | Status: DC | PRN
  Administered 2024-05-20: 5 mg via ORAL
  Administered 2024-05-21 – 2024-05-22 (×7): 10 mg via ORAL
  Filled 2024-05-20 (×7): qty 2
  Filled 2024-05-20: qty 1

## 2024-05-20 MED ORDER — ROCURONIUM BROMIDE 10 MG/ML (PF) SYRINGE
PREFILLED_SYRINGE | INTRAVENOUS | Status: DC | PRN
  Administered 2024-05-20: 50 mg via INTRAVENOUS

## 2024-05-20 MED ORDER — PHENYLEPHRINE 80 MCG/ML (10ML) SYRINGE FOR IV PUSH (FOR BLOOD PRESSURE SUPPORT)
PREFILLED_SYRINGE | INTRAVENOUS | Status: DC | PRN
  Administered 2024-05-20: 40 ug via INTRAVENOUS

## 2024-05-20 MED ORDER — EPHEDRINE 5 MG/ML INJ
INTRAVENOUS | Status: AC
  Filled 2024-05-20: qty 5

## 2024-05-20 MED ORDER — POVIDONE-IODINE 10 % EX SWAB
2.0000 | Freq: Once | CUTANEOUS | Status: AC
  Administered 2024-05-20: 2 via TOPICAL

## 2024-05-20 MED ORDER — VANCOMYCIN HCL 1000 MG IV SOLR
INTRAVENOUS | Status: DC | PRN
  Administered 2024-05-20: 1000 mg via TOPICAL

## 2024-05-20 MED ORDER — DEXAMETHASONE SOD PHOSPHATE PF 10 MG/ML IJ SOLN
INTRAMUSCULAR | Status: DC | PRN
  Administered 2024-05-20: 10 mg via INTRAVENOUS

## 2024-05-20 MED ORDER — MIDAZOLAM HCL (PF) 2 MG/2ML IJ SOLN: 2.0000 mg | Freq: Once | INTRAMUSCULAR | Status: AC

## 2024-05-20 MED ORDER — LIDOCAINE 2% (20 MG/ML) 5 ML SYRINGE
INTRAMUSCULAR | Status: AC
  Filled 2024-05-20: qty 5

## 2024-05-20 MED ORDER — ASPIRIN 81 MG PO TBEC
81.0000 mg | DELAYED_RELEASE_TABLET | Freq: Every day | ORAL | Status: DC
  Filled 2024-05-20 (×2): qty 1

## 2024-05-20 MED ORDER — SODIUM CHLORIDE 0.9 % IV SOLN: INTRAVENOUS | Status: DC

## 2024-05-20 MED ORDER — SUGAMMADEX SODIUM 200 MG/2ML IV SOLN
INTRAVENOUS | Status: AC
  Filled 2024-05-20: qty 2

## 2024-05-20 MED ORDER — STERILE WATER FOR IRRIGATION IR SOLN
Status: DC | PRN
  Administered 2024-05-20: 1000 mL

## 2024-05-20 MED ORDER — DOCUSATE SODIUM 100 MG PO CAPS
100.0000 mg | ORAL_CAPSULE | Freq: Two times a day (BID) | ORAL | Status: DC
  Administered 2024-05-20 – 2024-05-22 (×4): 100 mg via ORAL
  Filled 2024-05-20 (×4): qty 1

## 2024-05-20 MED ORDER — LACTATED RINGERS IV SOLN: INTRAVENOUS | Status: DC

## 2024-05-20 MED ORDER — DEXMEDETOMIDINE HCL IN NACL 80 MCG/20ML IV SOLN
INTRAVENOUS | Status: AC
  Filled 2024-05-20: qty 20

## 2024-05-20 MED ORDER — METOCLOPRAMIDE HCL 5 MG PO TABS
5.0000 mg | ORAL_TABLET | Freq: Three times a day (TID) | ORAL | Status: DC | PRN
  Administered 2024-05-21: 5 mg via ORAL
  Filled 2024-05-20: qty 1

## 2024-05-20 MED ORDER — PROPOFOL 10 MG/ML IV BOLUS
INTRAVENOUS | Status: AC
  Filled 2024-05-20: qty 20

## 2024-05-20 MED ORDER — TRANEXAMIC ACID-NACL 1000-0.7 MG/100ML-% IV SOLN
1000.0000 mg | INTRAVENOUS | Status: AC
  Administered 2024-05-20: 1000 mg via INTRAVENOUS
  Filled 2024-05-20: qty 100

## 2024-05-20 MED ORDER — LAMOTRIGINE 150 MG PO TABS
400.0000 mg | ORAL_TABLET | Freq: Every day | ORAL | Status: DC
  Administered 2024-05-20 – 2024-05-21 (×2): 400 mg via ORAL
  Filled 2024-05-20: qty 4
  Filled 2024-05-20 (×2): qty 1

## 2024-05-20 MED ORDER — BUPIVACAINE LIPOSOME 1.3 % IJ SUSP
INTRAMUSCULAR | Status: DC | PRN
  Administered 2024-05-20: 10 mL via PERINEURAL

## 2024-05-20 MED ORDER — IRRISEPT - 450ML BOTTLE WITH 0.05% CHG IN STERILE WATER, USP 99.95% OPTIME
TOPICAL | Status: DC | PRN
  Administered 2024-05-20: 450 mL via TOPICAL

## 2024-05-20 MED ORDER — PHENOL 1.4 % MT LIQD: 1.0000 | OROMUCOSAL | Status: DC | PRN

## 2024-05-20 MED ORDER — CEFAZOLIN SODIUM-DEXTROSE 2-4 GM/100ML-% IV SOLN
2.0000 g | INTRAVENOUS | Status: AC
  Administered 2024-05-20: 2 g via INTRAVENOUS
  Filled 2024-05-20: qty 100

## 2024-05-20 MED ORDER — LIDOCAINE 2% (20 MG/ML) 5 ML SYRINGE
INTRAMUSCULAR | Status: DC | PRN
  Administered 2024-05-20: 50 mg via INTRAVENOUS

## 2024-05-20 SURGICAL SUPPLY — 1 items: NDL 1/2 CIR MAYO (NEEDLE) IMPLANT

## 2024-05-20 NOTE — Op Note (Signed)
 NAME: Mariah Reeves, NEU MEDICAL RECORD NO: [id number] ACCOUNT NO: [id number] DATE OF BIRTH: 11-11-1965 FACILITY: [facility] LOCATION: [facility] PHYSICIAN: Cordella RAMAN. Addie, MD  Operative Report   DATE OF PROCEDURE: 05/20/2024  PREOPERATIVE DIAGNOSIS:  Right shoulder fracture nonunion.  POSTOPERATIVE DIAGNOSIS:  Right shoulder fracture nonunion.  PROCEDURE:  Right shoulder reverse shoulder replacement and biceps tenodesis using Biomet components including small uncemented baseplate with 1 central compression screw and 4 peripheral locking screws with 36 standard glenosphere with 1.5 mm offset  posteriorly and size 8 humeral fracture stem with +6 tapered offset humeral tray and standard 36 bearing.  SURGEON:  Cordella RAMAN. Addie, MD  ASSISTANT:  Herlene Calix, PA  INDICATIONS:  The patient is a 58 year old patient with right shoulder fracture nonunion who presents for operative management after explanation of risks and benefits.  DESCRIPTION OF PROCEDURE:  The patient was brought to the operating room where a general endotracheal anesthesia was induced.  Preoperative antibiotics were administered.  A timeout was called.  The patient was placed in the beach chair position with  head in neutral position.  Shoulder, arm, and hand were prescrubbed with hydrogen peroxide followed by alcohol and Betadine  and then prepped with ChloraPrep solution and draped in a sterile manner.  Ioban was used to seal the operative field, cover the  axilla, and then cover the operative field.  Head was in neutral position.  After calling timeout, a deltopectoral approach was made.  The cephalic vein was mobilized laterally.  Branch avulsion required ligation of the cephalic vein at its mid portion.   The upper 2 cm of the pec was released.  The deltoid was elevated off its anterior attachment to decrease some of the pressure on the deltoid.  The axillary nerve was palpated and protected at all times during the case.  The Cobell  retractor was placed.   The biceps tendon was tenodesed to the pec tendon as well as the surrounding tissue using 5-0 Vicryl sutures.  Circumflex vessels were ligated using silk ligatures.  Next, the rotator interval was opened.  The subscap was detached from the lesser  tuberosity using a 15 blade and then mobilized and then tagged with a 0 Vicryl suture.  Dissection was then performed around the inferior aspect of the humeral neck about 2 cm using a Cobb elevator down to the 5 o'clock position.  At this time, the  rotator interval was opened up to the base of the coracoid.  Next, broaching was performed on the humeral head after dislocating the humerus anteriorly.  Broaching was performed up to a size 8.  The head was then cut in 30 degrees of retroversion.   Fracture nonunion was visible.  The lesser tuberosity was the slightly more unstable piece than the greater tuberosity fragment.  Broaching was then performed up to a size 7, which gave a stable implant.  A cap was placed and attention was directed  towards the glenoid.  The anterior and posterior retractors were placed.  With the patient's muscle relaxation reversed, the labrum was excised.  This was done circumferentially.  A patient specific guide was then placed and the glenoid was reamed in  accordance with preoperative templating.  The small augment had a very good fit on the reamed surface.  The true augment was placed and 1 central compression screw and 3 peripheral locking screws were placed with good purchase attained.  Next, a  reduction was performed with the 36 standard glenosphere placed with the offset 1.5  mm placed posterior.  The reduction was then performed with the +6 humeral tray and the standard humeral liner.  The reduction was very stable.  It was difficult to  reduce and difficult to redislocate.  We did dislocate the construct and at that time, the fracture nonunion was visualized.  In general, it was stable.  Next, drill  holes were placed in the lesser tuberosity.  Suture tapes were placed.  At this time, we  irrigated out the humeral canal and placed in some vancomycin  powder.  Then, placed the true glenosphere onto the baseplate with a good Morse tapered fit obtained.  Next, we placed in the true 8 mm stem and then re-reduced it again with the +6 tray and  the 36 mm liner.  In the process of extracting the trial tray, the implant became loose in the greater tuberosity and the fracture nonunion was more prominent.  At this time, it was decided to consider cabling for the tuberosity, but that was not  feasible because the cable slid distally.  We then considered using suture tapes through the rotator cuff attachment site down around the neck, but that was also not successful.  In the end, because of the instability of the true 8 mm mini stem, we went  to a fracture stem.  An 8 mm fracture stem fit nicely.  We did resect the lesser tuberosity because of instability, but the greater tuberosity had healed about 90% and was stable.  Next, the true fracture stem was placed with bone graft proximally, which  gave a very stable construct.  We then reduced the shoulder again with the +6 tray with the standard liner and it was found to have excellent stability with adduction, extension and a forward force as well as with internal and external rotation in 90  degrees of abduction.  It was two fingers tight.  Next, this was dislocated.  True humeral tray and liner were placed.  The same stability parameters were maintained.  The axillary nerve was again palpated and found to be intact.  Thorough irrigation was  performed.  Next, the arm was taken through a range of motion and found to have excellent stability.  We did reattach the subscap to the supraspinatus through the rotator interval using #1 Vicryl sutures with the arm in about 25 degrees of external  rotation.  We also had 1 suture tape in the humeral shaft which we attached to  the inferior aspect of the subscap just to have some soft tissue bridge to the humerus.  External rotation was about 35 degrees after the repair.  Pouring irrigation x3 liters  was then performed.  Irrisept solution then vancomycin  powder was placed on the implant.  The deltopectoral interval was then closed using #1 Vicryl suture followed by interrupted inverted 0 Vicryl suture, 2-0 Vicryl suture, and 3-0 Monocryl with  Steri-Strips, Aquacel dressing and shoulder immobilizer were placed.  The patient tolerated the procedure well without immediate complications and transferred to the recovery room in stable condition.  Luke's assistance was required at all times for  retraction, opening, closing, and mobilization of tissue.  His assistance was a medical necessity.   VAI D: 05/20/2024 6:13:34 pm T: 05/20/2024 7:44:00 pm  JOB: [id number]/ [id number]

## 2024-05-20 NOTE — Anesthesia Procedure Notes (Signed)
 Anesthesia Regional Block: Interscalene brachial plexus block   Pre-Anesthetic Checklist: , timeout performed,  Correct Patient, Correct Site, Correct Laterality,  Correct Procedure, Correct Position, site marked,  Risks and benefits discussed,  Pre-op evaluation,  At surgeon's request and post-op pain management  Laterality: Right  Prep: Maximum Sterile Barrier Precautions used, chloraprep       Needles:  Injection technique: Single-shot  Needle Type: Echogenic Stimulator Needle     Needle Length: 4cm  Needle Gauge: 22     Additional Needles:   Procedures:,,,, ultrasound used (permanent image in chart),,    Narrative:  Start time: 05/20/2024 1:32 PM End time: 05/20/2024 1:35 PM Injection made incrementally with aspirations every 5 mL.  Performed by: Personally  Anesthesiologist: Paul Lamarr BRAVO, MD  Additional Notes: Risks, benefits, and alternative discussed. Patient gave consent for procedure. Patient prepped and draped in sterile fashion. Sedation administered, patient remains easily responsive to voice. Relevant anatomy identified with ultrasound guidance. Local anesthetic given in 5cc increments with no signs or symptoms of intravascular injection. No pain or paraesthesias with injection. Patient monitored throughout procedure with no signs of LAST or immediate complications. Tolerated well. Ultrasound image placed in chart.  LANEY Paul, MD

## 2024-05-20 NOTE — Brief Op Note (Signed)
   05/20/2024  6:03 PM  PATIENT:  Mariah Reeves  58 y.o. female  PRE-OPERATIVE DIAGNOSIS:  right shoulder fracture non union  POST-OPERATIVE DIAGNOSIS:  right shoulder fracture non union  PROCEDURE:  Procedure(s): RIGHT REVERSE SHOULDER ARTHROPLASTY, BICEPS TENODESIS  SURGEON:  Surgeon(s): Addie Cordella Hamilton, MD  ASSISTANT: magnant pa  ANESTHESIA:   general  EBL: 250 ml    Total I/O In: 1200 [I.V.:1000; IV Piggyback:200] Out: 225 [Blood:225]  BLOOD ADMINISTERED: none  DRAINS: none   LOCAL MEDICATIONS USED:  vanco  SPECIMEN:  No Specimen  COUNTS:  YES  TOURNIQUET:  * No tourniquets in log *  DICTATION: .Other Dictation: Dictation Number [id number]  PLAN OF CARE: Admit for overnight observation  PATIENT DISPOSITION:  PACU - hemodynamically stable

## 2024-05-20 NOTE — H&P (Signed)
 Mariah Reeves is an 58 y.o. female.   Chief Complaint: Right shoulder pain HPI: Mariah Reeves is a 58 y.o. female who presents to the office reporting right shoulder pain.  Patient had an injury from a fall in February 2025.  This was a proximal humerus fracture.  She was referred to Dr. 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  She is right-hand dominant.  Works as a runner, broadcasting/film/video.  Pain wakes her from sleep at night.  She reports some decreased range of motion as well as popping weakness and swelling.  Does take oxycodone  10 mg 4 times a day from pain management.  Has a history of left wrist fracture x 3.  Gastric bypass 20 years ago.  It is hard for her to hold her hand up to write.  She has had plain radiographs and CT scan which does show nonunion of proximal humerus fracture but there is not too much displacement of the head relative to the shaft.  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   DDD (degenerative disc disease), lumbar 12/24/2014   Depression    Facet syndrome, lumbar    Family history of breast cancer    2/21 cancer genetic testing letter sent   GERD (gastroesophageal reflux disease)    before gastric  bypass   Headache    Migraines   Hearing loss    left ear - has hearing aid   History of kidney stones    Left   Hx of vertigo    Hypertension    Patella fracture    Left   PTSD (post-traumatic stress disorder)    Sleep apnea 08/08/2021   BiPAP - uses nightly   Stroke (HCC) 07/06/2022   TIA on 08/30/22   Wears glasses     Past Surgical History:  Procedure Laterality Date   ABDOMINOPLASTY  2006   CARPAL TUNNEL RELEASE Left    CHOLECYSTECTOMY  2008   COLONOSCOPY W/ POLYPECTOMY     CYSTOSCOPY W/ URETERAL STENT PLACEMENT  01/03/2017   Procedure: CYSTOSCOPY WITH RETROGRADE PYELOGRAM/URETERAL STENT PLACEMENT;  Surgeon: Chauncey Redell Agent, MD;  Location: [facility] OR[facility];  Service: Urology;;   CYSTOSCOPY W/ URETERAL STENT PLACEMENT Left 01/17/2017   Procedure: CYSTOSCOPY WITH STENT REPLACEMENT;  Surgeon: Chauncey Redell Agent, MD;  Location: [facility] OR[facility];  Service: Urology;  Laterality: Left;   DILATATION & CURETTAGE/HYSTEROSCOPY WITH MYOSURE N/A 07/08/2015   Procedure: DILATATION & CURETTAGE/HYSTEROSCOPY WITH MYOSURE;  Surgeon: Lamar SHAUNNA Lesches, MD;  Location: [facility] OR[facility];  Service: Gynecology;  Laterality: N/A;   DILATION AND CURETTAGE OF UTERUS  1/17   EXTRACORPOREAL SHOCK WAVE LITHOTRIPSY Left 12/07/2016   Procedure: EXTRACORPOREAL SHOCK WAVE LITHOTRIPSY (ESWL);  Surgeon: Chauncey Redell Agent, MD;  Location: [facility];  Service: Urology;  Laterality: Left;   GASTRIC BYPASS  2005   KNEE SURGERY Right    ACL- repair   LITHOTRIPSY     MIDDLE EAR SURGERY     NASAL SEPTUM SURGERY     OVARY SURGERY Right    ROTATOR CUFF REPAIR Right 2023   [facility]   UPPER GI ENDOSCOPY     URETEROSCOPY WITH HOLMIUM LASER LITHOTRIPSY     perforated ureter. had nephrostomy tube for brief time   URETEROSCOPY WITH HOLMIUM LASER LITHOTRIPSY Left 01/17/2017   Procedure: URETEROSCOPY WITH HOLMIUM LASER LITHOTRIPSY;  Surgeon: Chauncey Redell Agent, MD;  Location: [facility];  Service: Urology;  Laterality: Left;     Family History  Problem Relation Age of Onset   Hypertension Mother    Diabetes Mother    Heart disease Father    Alcohol abuse Father    Breast cancer Maternal Grandmother        not sure of age   Breast cancer Other    Breast cancer Other    Kidney cancer Neg Hx    Bladder Cancer Neg Hx    Social History:  reports that she has never smoked. She has never used smokeless tobacco. She reports that she does not drink alcohol and does not use drugs.  Allergies:  Allergies  Allergen Reactions   Contrast Media [Iodinated Contrast Media] Hives   Silicone Hives    silicones   Carbetapentane Hives   Tape Hives    Adhesive Tape   Trazodone  And Nefazodone     Sleep walking    Medications Prior to Admission  Medication Sig Dispense Refill   dasatinib  (SPRYCEL ) 80 MG tablet Take 80 mg by mouth in the morning.     lamoTRIgine  (LAMICTAL ) 200 MG tablet Take 400 mg by mouth at bedtime.      ondansetron  (ZOFRAN -ODT) 8 MG disintegrating tablet Take 8 mg by mouth 2 (two) times daily as needed for vomiting or nausea.     oxyCODONE -acetaminophen  (PERCOCET) 10-325 MG tablet Take 1 tablet by mouth 4 (four) times daily as needed for pain.     estradiol  (ESTRACE ) 0.1 MG/GM vaginal cream Insert 1 g vaginally weekly as maintenance (Patient taking differently: Place 1 Applicatorful vaginally daily as needed (vaginal discomfort/dryness).) 42.5 g 1    No results found for this or any previous visit (from the past 48 hours). No results found.  Review of Systems  Musculoskeletal:  Positive for arthralgias.  All other systems reviewed and are negative.   Blood pressure (!) 158/86, pulse 82, temperature 98.5 F (36.9 C), temperature source Oral, resp. rate 20, height 4' 11 (1.499 m), weight 78.6 kg, last menstrual period 12/01/2016, SpO2 99%. Physical Exam Vitals reviewed.  HENT:     Head: Normocephalic.     Nose: Nose normal.     Mouth/Throat:     Mouth: Mucous membranes are moist.  Eyes:      Pupils: Pupils are equal, round, and reactive to light.  Cardiovascular:     Rate and Rhythm: Normal rate.     Pulses: Normal pulses.  Pulmonary:     Effort: Pulmonary effort is normal.  Abdominal:     General: Abdomen is flat.  Musculoskeletal:     Cervical back: Normal range of motion.  Skin:    General: Skin is  warm.     Capillary Refill: Capillary refill takes less than 2 seconds.  Neurological:     General: No focal deficit present.     Mental Status: She is alert.  Psychiatric:        Mood and Affect: Mood normal.     Ortho exam demonstrates mild crepitus with passive range of motion on the right-hand side. She does have external rotation and forward flexion weakness on the right compared to the left of 4 out of 5 compared to 5 out of 5. Subscap strength is 5- out of 5 on the right compared to the left. She does have active forward flexion abduction to about 90 degrees but anything higher than that requires assistance with her left hand. Deltoid does fire. Motor or sensory function of the hand is intact. Cervical spine range of motion intact.  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 This will be a difficult surgery for her to get over. The chronic leukemia and treatment of that also is a mitigating factor. Patient is 4 foot 11 and so component position and planning will be paramount. With the use of models and diagrams the concept of reverse shoulder replacement is discussed. The risks and benefits including but not limited to infection instability nerve vessel damage along with incomplete pain relief and incomplete restoration of function are all discussed.  Risk of instability and implant loosening also discussed.  Because this is a fracture nonunion we will attempt to mobilize the tuberosity fragments and get them to heal back to the implant.  Patient understands the risk and benefits and wishes  to proceed.  All questions answered.  CT scan performed for optimal patient implant positioning  KANDICE Glendia Hutchinson, MD 05/20/2024, 1:30 PM

## 2024-05-20 NOTE — Transfer of Care (Signed)
 Immediate Anesthesia Transfer of Care Note  Patient: Mariah Reeves  Procedure(s) Performed: RIGHT REVERSE SHOULDER ARTHROPLASTY, BICEPS TENODESIS (Right: Shoulder)  Patient Location: PACU  Anesthesia Type:GA combined with regional for post-op pain  Level of Consciousness: drowsy  Airway & Oxygen Therapy: Patient Spontanous Breathing  Post-op Assessment: Report given to RN and Post -op Vital signs reviewed and stable  Post vital signs: Reviewed and stable  Last Vitals:  Vitals Value Taken Time  BP 113/82 05/20/24 18:17  Temp 36.4 C 05/20/24 18:15  Pulse 90 05/20/24 18:18  Resp 15 05/20/24 18:18  SpO2 97 % 05/20/24 18:18  Vitals shown include unfiled device data.  Last Pain:  Vitals:   05/20/24 1342  TempSrc:   PainSc: 0-No pain      Patients Stated Pain Goal: 0 (05/20/24 1304)  Complications: No notable events documented.

## 2024-05-20 NOTE — Anesthesia Procedure Notes (Signed)
 Procedure Name: Intubation Date/Time: 05/20/2024 2:22 PM  Performed by: Atanacio Arland HERO, CRNAPre-anesthesia Checklist: Patient identified, Emergency Drugs available, Suction available and Patient being monitored Patient Re-evaluated:Patient Re-evaluated prior to induction Oxygen Delivery Method: Circle System Utilized Preoxygenation: Pre-oxygenation with 100% oxygen Induction Type: IV induction Ventilation: Mask ventilation without difficulty Laryngoscope Size: Mac and 4 Grade View: Grade I Tube type: Oral Tube size: 7.0 mm Number of attempts: 1 Airway Equipment and Method: Stylet Placement Confirmation: ETT inserted through vocal cords under direct vision, positive ETCO2 and breath sounds checked- equal and bilateral Secured at: 21 cm Tube secured with: Tape Dental Injury: Teeth and Oropharynx as per pre-operative assessment

## 2024-05-21 ENCOUNTER — Encounter (HOSPITAL_COMMUNITY): Payer: Self-pay | Admitting: Orthopedic Surgery

## 2024-05-21 DIAGNOSIS — M19011 Primary osteoarthritis, right shoulder: Secondary | ICD-10-CM | POA: Diagnosis not present

## 2024-05-21 LAB — CBC WITH DIFFERENTIAL/PLATELET
Abs Immature Granulocytes: 0.03 K/uL (ref 0.00–0.07)
Basophils Absolute: 0 K/uL (ref 0.0–0.1)
Basophils Relative: 0 %
Eosinophils Absolute: 0 K/uL (ref 0.0–0.5)
Eosinophils Relative: 0 %
HCT: 26.6 % — ABNORMAL LOW (ref 36.0–46.0)
Hemoglobin: 9 g/dL — ABNORMAL LOW (ref 12.0–15.0)
Immature Granulocytes: 0 %
Lymphocytes Relative: 9 %
Lymphs Abs: 0.8 K/uL (ref 0.7–4.0)
MCH: 35 pg — ABNORMAL HIGH (ref 26.0–34.0)
MCHC: 33.8 g/dL (ref 30.0–36.0)
MCV: 103.5 fL — ABNORMAL HIGH (ref 80.0–100.0)
Monocytes Absolute: 0.6 K/uL (ref 0.1–1.0)
Monocytes Relative: 8 %
Neutro Abs: 6.8 K/uL (ref 1.7–7.7)
Neutrophils Relative %: 83 %
Platelets: 90 K/uL — ABNORMAL LOW (ref 150–400)
RBC: 2.57 MIL/uL — ABNORMAL LOW (ref 3.87–5.11)
RDW: 14.6 % (ref 11.5–15.5)
WBC: 8.2 K/uL (ref 4.0–10.5)
nRBC: 0 % (ref 0.0–0.2)

## 2024-05-21 NOTE — Progress Notes (Signed)
°  Subjective: Mariah Reeves is a 58 y.o. female s/p right RSA.  They are POD 1.  Pt's pain is controlled but block still in effect currently.  Patient denies any complaints of chest pain, shortness of breath, abdominal pain.  Overall doing well and having some posterior lateral shoulder pain.  Anterior shoulder still not having any pain.  Objective: Vital signs in last 24 hours: Temp:  [97.6 F (36.4 C)-99 F (37.2 C)] 98.1 F (36.7 C) (12/10 0801) Pulse Rate:  [77-105] 77 (12/10 0801) Resp:  [13-24] 19 (12/10 0801) BP: (98-165)/(56-86) 100/56 (12/10 0801) SpO2:  [96 %-100 %] 97 % (12/10 0801) Weight:  [78.6 kg] 78.6 kg (12/09 1254)  Intake/Output from previous day: 12/09 0701 - 12/10 0700 In: 1600 [I.V.:1400; IV Piggyback:200] Out: 225 [Blood:225] Intake/Output this shift: Total I/O In: 240 [P.O.:240] Out: -   Exam:  No gross blood or drainage overlying the dressing 2+ radial pulse of the operative extremity Postoperative physical exam somewhat limited by interscalene block but intact EPL, FPL, finger abduction.  Grip strength limited compared with contralateral side.  Still cannot fire deltoid or flex at the elbow consistent with interscalene block.   Labs: Recent Labs    05/20/24 2200 05/21/24 0719  HGB 9.7* 9.0*   Recent Labs    05/20/24 2200 05/21/24 0719  WBC 7.0 8.2  RBC 2.85* 2.57*  HCT 28.9* 26.6*  PLT 83* 90*   No results for input(s): NA, K, CL, CO2, BUN, CREATININE, GLUCOSE, CALCIUM in the last 72 hours. No results for input(s): LABPT, INR in the last 72 hours.  Assessment/Plan: Pt is POD 1 s/p right RSA    -Plan to discharge to home likely tomorrow pending patient's pain.  She has history of taking chronic hydrocodone  that was transition to oxycodone  by her pain management provider with her history of fracture nonunion.  She wants to go back to hydrocodone  eventually but with this preop opioid medication, pain control after surgery  will likely be difficult.  We will keep her until tomorrow most likely and try and get her pain is best controlled as possible in the meantime.  -No lifting with the operative arm  -Follow-up with Dr. Addie in clinic 2 weeks postoperatively     [facility] 05/21/2024, 8:58 AM

## 2024-05-21 NOTE — Care Management Obs Status (Signed)
 MEDICARE OBSERVATION STATUS NOTIFICATION   Patient Details  Name: Mariah Reeves MRN: [id number] Date of Birth: [birth year]   Medicare Observation Status Notification Given:  Yes    Jennie Laneta Dragon 05/21/2024, 9:12 AM

## 2024-05-21 NOTE — Anesthesia Postprocedure Evaluation (Signed)
 Anesthesia Post Note  Patient: Mariah Reeves  Procedure(s) Performed: RIGHT REVERSE SHOULDER ARTHROPLASTY, BICEPS TENODESIS (Right: Shoulder)     Patient location during evaluation: PACU Anesthesia Type: General Level of consciousness: awake and alert Pain management: pain level controlled Vital Signs Assessment: post-procedure vital signs reviewed and stable Respiratory status: spontaneous breathing, nonlabored ventilation and respiratory function stable Cardiovascular status: blood pressure returned to baseline Postop Assessment: no apparent nausea or vomiting Anesthetic complications: no   No notable events documented.          Vertell Row

## 2024-05-21 NOTE — Progress Notes (Signed)
 Patient stable.  Starting to have some axillary pain.  The block has likely started to wear off.  Hemoglobin 9.  Patient has been up walking around.  Postop radiographs look good.  Plan at this time is to keep 1 more night to make sure pain is controlled.  Anticipate discharge in the morning.  Okay for CPM use if that can be managed in her current living situation.  Otherwise she will need home health physical therapy.

## 2024-05-21 NOTE — Evaluation (Signed)
 Occupational Therapy Evaluation Patient Details Name: Mariah Reeves MRN: [id number] DOB: 11/18/1965 Today's Date: 05/21/2024   History of Present Illness   58 yo F adm s/p 12/9 R shoulder reverse replacement and biceps tenodesis by Dr Addie. PMH shoulder injury in 07/2023 as result of fall, hx L wrist fx x3, gastric bypass chronic leukemia BCR/ ABL positive, tumor of the uterus, bipolar, chronic back pain, bil occipital neuralgia, DDD, PTSD, sleep apnea with bipap uses nightly, TIA 08/2022     Clinical Impressions PTA, pt lived alone and was mod I for BADL, IADL, working, and driving. Pt reports several friends and neighbors can assist her at discharge as needed. Upon eval, pt performing UB ADL with up to mod A and LB ADL with up to mod A. Pt educated and demonstrating use of compensatory techniques for UB bathing, dressing, sling application/positioning/wear schedule, LB ADL, sleep positioning and grooming. Reviewed and pt demonstrating pendulum and elbow/wrist/hand exercises and understanding of NWB and no shoulder AROM precautions as outlined by physician orders. Recommending follow physician orders for discharge therapies per physician's shoulder protocol. Will follow acutely for further hands on training re: implementation of precautions during ADL.      If plan is discharge home, recommend the following:   A little help with walking and/or transfers;A little help with bathing/dressing/bathroom;Assistance with cooking/housework;A lot of help with bathing/dressing/bathroom;Assist for transportation;Help with stairs or ramp for entrance     Functional Status Assessment   Patient has had a recent decline in their functional status and demonstrates the ability to make significant improvements in function in a reasonable and predictable amount of time.     Equipment Recommendations   None recommended by OT     Recommendations for Other Services          Precautions/Restrictions   Precautions Precautions: Shoulder Type of Shoulder Precautions: No AROM shoulder with exception ER 0-30, AROM elbow/wrist/hand to tolerance, ok for pendulums, sling Shoulder Interventions: Shoulder sling/immobilizer;Off for dressing/bathing/exercises Precaution Booklet Issued: Yes (comment) Recall of Precautions/Restrictions: Intact Precaution/Restrictions Comments: all precautions reviewed within the context of ADL Required Braces or Orthoses: Sling Restrictions Weight Bearing Restrictions Per Provider Order: Yes RUE Weight Bearing Per Provider Order: Non weight bearing     Mobility Bed Mobility Overal bed mobility: Needs Assistance Bed Mobility: Supine to Sit     Supine to sit: Mod assist     General bed mobility comments: for trunk; pt reports that at home, she is able to hook foot under dresser and pull on a tucked in sheet to perform bed mobility without assist    Transfers Overall transfer level: Needs assistance Equipment used: None Transfers: Sit to/from Stand Sit to Stand: Supervision                  Balance Overall balance assessment: Needs assistance Sitting-balance support: No upper extremity supported, Feet supported Sitting balance-Leahy Scale: Good     Standing balance support: No upper extremity supported, During functional activity Standing balance-Leahy Scale: Fair Standing balance comment: limited tolerance of challenge                           ADL either performed or assessed with clinical judgement   ADL Overall ADL's : Needs assistance/impaired Eating/Feeding: Set up;Sitting   Grooming: Standing;Supervision/safety   Upper Body Bathing: Minimal assistance;Sitting   Lower Body Bathing: Moderate assistance;Sit to/from stand   Upper Body Dressing : Maximal assistance;Sitting   Lower Body  Dressing: Moderate assistance;Sit to/from stand   Toilet Transfer: Contact guard assist;Ambulation            Functional mobility during ADLs: Contact guard assist       Vision Baseline Vision/History: 1 Wears glasses Ability to See in Adequate Light: 1 Impaired Patient Visual Report: No change from baseline Vision Assessment?: No apparent visual deficits     Perception         Praxis         Pertinent Vitals/Pain Pain Assessment Pain Assessment: Faces Faces Pain Scale: Hurts little more Pain Location: operative site Pain Descriptors / Indicators: Operative site guarding Pain Intervention(s): Limited activity within patient's tolerance, Monitored during session     Extremity/Trunk Assessment Upper Extremity Assessment Upper Extremity Assessment: Right hand dominant;RUE deficits/detail RUE Deficits / Details: nerve block still active. pt with active flexion of digits, but not full range yet as well as significant numbness along radial nerve distribution thumb> digits.   Lower Extremity Assessment Lower Extremity Assessment: Generalized weakness       Communication Communication Communication: No apparent difficulties Factors Affecting Communication:  (L hearing aide)   Cognition Arousal: Alert Behavior During Therapy: WFL for tasks assessed/performed Cognition: No apparent impairments                               Following commands: Intact       Cueing  General Comments   Cueing Techniques: Verbal cues      Exercises Exercises: Shoulder Shoulder Exercises Pendulum Exercise: PROM, Right, 5 reps Elbow Flexion: PROM, Right, 10 reps Wrist Flexion: PROM, Right, 10 reps Wrist Extension: PROM, Right, 10 reps Digit Composite Flexion: AROM, Right, 10 reps Composite Extension: AROM, Right, 10 reps   Shoulder Instructions Shoulder Instructions Donning/doffing shirt without moving shoulder: Moderate assistance;Patient able to independently direct caregiver Method for sponge bathing under operated UE: Minimal assistance;Patient able to  independently direct caregiver Donning/doffing sling/immobilizer: Moderate assistance;Patient able to independently direct caregiver Correct positioning of sling/immobilizer: Patient able to independently direct caregiver Pendulum exercises (written home exercise program): Supervision/safety ROM for elbow, wrist and digits of operated UE: Minimal assistance (2/2 nerve block active) Sling wearing schedule (on at all times/off for ADL's): Modified independent Proper positioning of operated UE when showering: Supervision/safety Positioning of UE while sleeping: Modified independent    Home Living Family/patient expects to be discharged to:: Private residence Living Arrangements: Alone Available Help at Discharge: Friend(s);Neighbor;Available PRN/intermittently Type of Home: Mobile home Home Access: Stairs to enter Entrance Stairs-Number of Steps: 4-5 Entrance Stairs-Rails: Right;Left Home Layout: One level     Bathroom Shower/Tub: Chief Strategy Officer: Standard     Home Equipment: Cane - single Librarian, Academic (2 wheels)   Additional Comments: pt reports having good support from teacher friends      Prior Functioning/Environment Prior Level of Function : Independent/Modified Independent;Working/employed;Driving             Mobility Comments: uses cane when R knee bothering her ADLs Comments: indep in ADL and IADL, 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    OT Problem List: Decreased strength;Decreased activity tolerance;Impaired balance (sitting and/or standing);Decreased coordination;Pain;Impaired UE functional use;Decreased knowledge of precautions   OT Treatment/Interventions: Self-care/ADL training;Therapeutic exercise;DME and/or AE instruction;Therapeutic activities;Balance training;Patient/family education      OT Goals(Current goals can be found in the care plan section)   Acute Rehab OT Goals  Patient Stated Goal:  get better OT Goal Formulation: With patient Time For Goal Achievement: 06/04/24 Potential to Achieve Goals: Good   OT Frequency:  Min 2X/week    Co-evaluation              AM-PAC OT 6 Clicks Daily Activity     Outcome Measure Help from another person eating meals?: A Little Help from another person taking care of personal grooming?: A Little Help from another person toileting, which includes using toliet, bedpan, or urinal?: A Little Help from another person bathing (including washing, rinsing, drying)?: A Lot Help from another person to put on and taking off regular upper body clothing?: A Lot Help from another person to put on and taking off regular lower body clothing?: A Lot 6 Click Score: 15   End of Session Equipment Utilized During Treatment: Other (comment) (R shoulder sling) Nurse Communication: Mobility status  Activity Tolerance: Patient tolerated treatment well Patient left: in bed;with call bell/phone within reach  OT Visit Diagnosis: Unsteadiness on feet (R26.81);Muscle weakness (generalized) (M62.81);History of falling (Z91.81);Pain Pain - Right/Left: Right Pain - part of body: Shoulder                Time: 0826-0906 OT Time Calculation (min): 40 min Charges:  OT General Charges $OT Visit: 1 Visit OT Evaluation $OT Eval Low Complexity: 1 Low OT Treatments $Self Care/Home Management : 23-37 mins  Elma JONETTA Lebron FREDERICK, OTR/L [facility] Acute Rehabilitation Office: [phone]   Elma JONETTA Lebron 05/21/2024, 9:37 AM

## 2024-05-22 ENCOUNTER — Other Ambulatory Visit (HOSPITAL_COMMUNITY): Payer: Self-pay

## 2024-05-22 DIAGNOSIS — M19011 Primary osteoarthritis, right shoulder: Secondary | ICD-10-CM | POA: Diagnosis not present

## 2024-05-22 MED ORDER — OXYCODONE HCL 5 MG PO TABS
5.0000 mg | ORAL_TABLET | ORAL | 0 refills | Status: DC | PRN
  Filled 2024-05-22: qty 30, 3d supply, fill #0

## 2024-05-22 MED ORDER — ACETAMINOPHEN 325 MG PO TABS
650.0000 mg | ORAL_TABLET | Freq: Four times a day (QID) | ORAL | 0 refills | Status: AC | PRN
  Filled 2024-05-22: qty 30, 4d supply, fill #0

## 2024-05-22 MED ORDER — DOCUSATE SODIUM 100 MG PO CAPS
100.0000 mg | ORAL_CAPSULE | Freq: Two times a day (BID) | ORAL | 0 refills | Status: AC
  Filled 2024-05-22: qty 10, 5d supply, fill #0

## 2024-05-22 MED ORDER — METHOCARBAMOL 500 MG PO TABS
500.0000 mg | ORAL_TABLET | Freq: Three times a day (TID) | ORAL | 0 refills | Status: AC | PRN
  Filled 2024-05-22: qty 30, 10d supply, fill #0

## 2024-05-22 NOTE — Evaluation (Addendum)
 Occupational Therapy Treatment Patient Details Name: Mariah Reeves MRN: [id number] DOB: [birth year] Today's Date: 05/22/2024   History of present illness 58 yo F adm s/p 12/9 R shoulder reverse replacement and biceps tenodesis by Dr Addie. PMH shoulder injury in 07/2023 as result of fall, hx L wrist fx x3, gastric bypass chronic leukemia BCR/ ABL positive, tumor of the uterus, bipolar, chronic back pain, bil occipital neuralgia, DDD, PTSD, sleep apnea with bipap uses nightly, TIA 08/2022   OT comments  Pt making good progress towards OT goals. Session focused on ADLs in prep for likely DC home today. Pt able to manage mobility and toileting tasks with Modified Independence. Pt able to assist with dressing tasks more today but continues to require Min A up to Mod A for certain aspects. Discussed pt familiarity with compensatory strategies given hx of R UE fx. Pt also reports having adequate family and friend assist upon DC. Pt appears functionally appropriate for DC home once medically cleared.      If plan is discharge home, recommend the following:  A little help with walking and/or transfers;A little help with bathing/dressing/bathroom;Assistance with cooking/housework;A lot of help with bathing/dressing/bathroom;Assist for transportation;Help with stairs or ramp for entrance   Equipment Recommendations  None recommended by OT    Recommendations for Other Services      Precautions / Restrictions Precautions Precautions: Shoulder Type of Shoulder Precautions: No AROM shoulder with exception ER 0-30, AROM elbow/wrist/hand to tolerance, ok for pendulums, sling Shoulder Interventions: Shoulder sling/immobilizer;Off for dressing/bathing/exercises Precaution Booklet Issued: Yes (comment) Recall of Precautions/Restrictions: Intact Required Braces or Orthoses: Sling Restrictions Weight Bearing Restrictions Per Provider Order: Yes RUE Weight Bearing Per Provider Order: Non weight bearing        Mobility Bed Mobility Overal bed mobility: Needs Assistance Bed Mobility: Supine to Sit, Sit to Supine     Supine to sit: Min assist Sit to supine: Modified independent (Device/Increase time)   General bed mobility comments: Min A to lift trunk. reports hooking foot under dresser at home and pulling on sheet to lift self at home. able to lay back down without assist    Transfers Overall transfer level: Independent Equipment used: None Transfers: Sit to/from Stand                   Balance Overall balance assessment: No apparent balance deficits (not formally assessed)                                         ADL either performed or assessed with clinical judgement   ADL Overall ADL's : Needs assistance/impaired                 Upper Body Dressing : Moderate assistance;Minimal assistance;Sitting Upper Body Dressing Details (indicate cue type and reason): Donning camisole, button up pajama top and sling. pt able to doff sling, assist donning on affected UE with OT assist. assist to pull down garments in bag and assist with sling redonning Lower Body Dressing: Minimal assistance;Sit to/from stand;Sitting/lateral leans Lower Body Dressing Details (indicate cue type and reason): assist with socks - reports she does not plan to wear socks at home. Min A to don pants and undergarments. able to pull up over waist well Toilet Transfer: Modified Independent;Ambulation;Regular Teacher, Adult Education Details (indicate cue type and reason): no AD Toileting- Clothing Manipulation and Hygiene: Modified independent;Sit to/from stand;Sitting/lateral lean  Toileting - Clothing Manipulation Details (indicate cue type and reason): no assist, able to use LUE            Extremity/Trunk Assessment Upper Extremity Assessment Upper Extremity Assessment: Right hand dominant;RUE deficits/detail RUE Deficits / Details: reports some pain but appeared tolerable during  session. reports some numbness in hand from nerve block but able to use the hand to assist with functional tasks   Lower Extremity Assessment Lower Extremity Assessment: Overall WFL for tasks assessed        Vision   Vision Assessment?: No apparent visual deficits   Perception     Praxis     Communication Communication Communication: No apparent difficulties   Cognition Arousal: Alert Behavior During Therapy: WFL for tasks assessed/performed Cognition: No apparent impairments                               Following commands: Intact        Cueing   Cueing Techniques: Verbal cues  Exercises      Shoulder Instructions       General Comments      Pertinent Vitals/ Pain       Pain Assessment Pain Assessment: Faces Faces Pain Scale: Hurts little more Pain Location: R shoulder Pain Descriptors / Indicators: Operative site guarding Pain Intervention(s): Monitored during session, Limited activity within patient's tolerance, Patient requesting pain meds-RN notified  Home Living                                          Prior Functioning/Environment              Frequency  Min 2X/week        Progress Toward Goals  OT Goals(current goals can now be found in the care plan section)  Progress towards OT goals: Progressing toward goals  Acute Rehab OT Goals Patient Stated Goal: recover pain, pain control OT Goal Formulation: With patient Time For Goal Achievement: 06/04/24 Potential to Achieve Goals: Good ADL Goals Pt Will Perform Upper Body Dressing: with set-up;sitting Pt Will Perform Lower Body Dressing: with set-up;sit to/from stand Pt Will Transfer to Toilet: with modified independence;ambulating Pt Will Perform Tub/Shower Transfer: with supervision;Tub transfer;ambulating Pt/caregiver will Perform Home Exercise Program: Right Upper extremity;With written HEP provided  Plan      Co-evaluation                  AM-PAC OT 6 Clicks Daily Activity     Outcome Measure   Help from another person eating meals?: None Help from another person taking care of personal grooming?: None Help from another person toileting, which includes using toliet, bedpan, or urinal?: None Help from another person bathing (including washing, rinsing, drying)?: A Little Help from another person to put on and taking off regular upper body clothing?: A Lot Help from another person to put on and taking off regular lower body clothing?: A Little 6 Click Score: 20    End of Session Equipment Utilized During Treatment: Other (comment) (sling)  OT Visit Diagnosis: Unsteadiness on feet (R26.81);Muscle weakness (generalized) (M62.81);History of falling (Z91.81);Pain Pain - Right/Left: Right Pain - part of body: Shoulder   Activity Tolerance Patient tolerated treatment well   Patient Left in bed;with call bell/phone within reach   Nurse Communication Mobility status;Patient requests pain meds  Time: 9096-9064 OT Time Calculation (min): 32 min  Charges: OT General Charges $OT Visit: 1 Visit OT Treatments $Self Care/Home Management : 23-37 mins  Mliss NOVAK, OTR/L Acute Rehab Services Office: [phone]   Mliss Fish 05/22/2024, 9:49 AM

## 2024-05-22 NOTE — Progress Notes (Signed)
 Patient is POD 2 s/p right reverse total shoulder arthroplasty.  Doing okay.  Pain was somewhat uncontrolled last night but it is improved today.  This is expected from interscalene nerve block wearing off.  Denies any other complaints.  She has dressing that is clean dry and intact with intact EPL, FPL, finger abduction.  Still having some difficulty with bicep flexion and deltoid abduction which may be remnant from interscalene block or pain related.  Palpable radial pulse rated 2+.  Plan today is discharge home as long as she has pain somewhat controlled with oral oxycodone .  We will trial this today.  Spoke with RN about plan who is agreeable.  Discharge orders placed.

## 2024-05-22 NOTE — Plan of Care (Signed)
°  Problem: Education: Goal: Knowledge of General Education information will improve Description: Including pain rating scale, medication(s)/side effects and non-pharmacologic comfort measures 05/22/2024 1107 by Delores Kirsch, RN Outcome: Adequate for Discharge 05/22/2024 1100 by Delores Kirsch, RN Outcome: Progressing   Problem: Health Behavior/Discharge Planning: Goal: Ability to manage health-related needs will improve 05/22/2024 1107 by Delores Kirsch, RN Outcome: Adequate for Discharge 05/22/2024 1100 by Delores Kirsch, RN Outcome: Progressing   Problem: Clinical Measurements: Goal: Ability to maintain clinical measurements within normal limits will improve 05/22/2024 1107 by Delores Kirsch, RN Outcome: Adequate for Discharge 05/22/2024 1100 by Delores Kirsch, RN Outcome: Progressing Goal: Will remain free from infection Outcome: Adequate for Discharge Goal: Diagnostic test results will improve Outcome: Adequate for Discharge Goal: Respiratory complications will improve Outcome: Adequate for Discharge Goal: Cardiovascular complication will be avoided Outcome: Adequate for Discharge   Problem: Activity: Goal: Risk for activity intolerance will decrease Outcome: Adequate for Discharge   Problem: Nutrition: Goal: Adequate nutrition will be maintained Outcome: Adequate for Discharge   Problem: Coping: Goal: Level of anxiety will decrease Outcome: Adequate for Discharge   Problem: Elimination: Goal: Will not experience complications related to bowel motility Outcome: Adequate for Discharge Goal: Will not experience complications related to urinary retention Outcome: Adequate for Discharge   Problem: Pain Managment: Goal: General experience of comfort will improve and/or be controlled Outcome: Adequate for Discharge   Problem: Safety: Goal: Ability to remain free from injury will improve Outcome: Adequate for Discharge   Problem: Skin Integrity: Goal: Risk for  impaired skin integrity will decrease Outcome: Adequate for Discharge   Problem: Education: Goal: Knowledge of the prescribed therapeutic regimen will improve Outcome: Adequate for Discharge Goal: Understanding of activity limitations/precautions following surgery will improve Outcome: Adequate for Discharge Goal: Individualized Educational Video(s) Outcome: Adequate for Discharge   Problem: Activity: Goal: Ability to tolerate increased activity will improve Outcome: Adequate for Discharge   Problem: Pain Management: Goal: Pain level will decrease with appropriate interventions Outcome: Adequate for Discharge

## 2024-05-22 NOTE — Plan of Care (Signed)

## 2024-05-22 NOTE — Plan of Care (Signed)
   Problem: Education: Goal: Knowledge of General Education information will improve Description: Including pain rating scale, medication(s)/side effects and non-pharmacologic comfort measures Outcome: Progressing   Problem: Health Behavior/Discharge Planning: Goal: Ability to manage health-related needs will improve Outcome: Progressing   Problem: Clinical Measurements: Goal: Ability to maintain clinical measurements within normal limits will improve Outcome: Progressing Goal: Will remain free from infection Outcome: Progressing Goal: Diagnostic test results will improve Outcome: Progressing Goal: Respiratory complications will improve Outcome: Progressing Goal: Cardiovascular complication will be avoided Outcome: Progressing   Problem: Activity: Goal: Risk for activity intolerance will decrease Outcome: Progressing   Problem: Nutrition: Goal: Adequate nutrition will be maintained Outcome: Progressing   Problem: Coping: Goal: Level of anxiety will decrease Outcome: Progressing   Problem: Elimination: Goal: Will not experience complications related to bowel motility Outcome: Progressing Goal: Will not experience complications related to urinary retention Outcome: Progressing   Problem: Pain Managment: Goal: General experience of comfort will improve and/or be controlled Outcome: Progressing   Problem: Safety: Goal: Ability to remain free from injury will improve Outcome: Progressing   Problem: Skin Integrity: Goal: Risk for impaired skin integrity will decrease Outcome: Progressing   Problem: Education: Goal: Knowledge of the prescribed therapeutic regimen will improve Outcome: Progressing Goal: Understanding of activity limitations/precautions following surgery will improve Outcome: Progressing Goal: Individualized Educational Video(s) Outcome: Progressing   Problem: Activity: Goal: Ability to tolerate increased activity will improve Outcome: Progressing    Problem: Pain Management: Goal: Pain level will decrease with appropriate interventions Outcome: Progressing

## 2024-05-23 ENCOUNTER — Telehealth: Payer: Self-pay

## 2024-05-23 ENCOUNTER — Other Ambulatory Visit: Payer: Self-pay

## 2024-05-23 ENCOUNTER — Other Ambulatory Visit: Payer: Self-pay | Admitting: Surgical

## 2024-05-23 MED ORDER — NALOXONE HCL 0.4 MG/ML IJ SOLN: 0.4000 mg | INTRAMUSCULAR | 0 refills | Status: AC | PRN

## 2024-05-23 MED ORDER — OXYCODONE HCL 5 MG PO TABS: 15.0000 mg | ORAL_TABLET | ORAL | 0 refills | Status: DC | PRN

## 2024-05-23 NOTE — Telephone Encounter (Signed)
 I called patient.  Oxycodone  10 mg not helping.  She was taking Percocet 10 mg prior to surgery.  We will try oxycodone  15 mg every 4 hours.  She is out of Narcan at home so I refilled this.  She has had oxycodone  20 mg at times before surgery and tolerated this well.  She will start out taking 15 mg every 6 hours and then transition to every 4 hours if she still has severe out-of-control pain.

## 2024-05-23 NOTE — Telephone Encounter (Signed)
 Patient called stating that she had a bad night due to the pain that she was having and that the Oxycodone  is not really helping.  Patient had right shoulder surgery on 05/20/2024.  Cb# [phone].  Please advise.  Thank you.

## 2024-05-26 ENCOUNTER — Telehealth: Payer: Self-pay | Admitting: Orthopedic Surgery

## 2024-05-26 ENCOUNTER — Other Ambulatory Visit: Payer: Self-pay | Admitting: Surgical

## 2024-05-26 MED ORDER — OXYCODONE HCL 5 MG PO TABS: 15.0000 mg | ORAL_TABLET | ORAL | 0 refills | Status: DC | PRN

## 2024-05-26 NOTE — Telephone Encounter (Signed)
 Okay for passive range of motion and active assisted range of motion for full abduction and forward flexion.  No extension or range of motion behind the back.  No external rotation past 40 degrees.  Okay for deltoid isometric strengthening exercises but no isotonic exercises.

## 2024-05-26 NOTE — Telephone Encounter (Signed)
 Tried calling Amy back. No answer. Will try again later to reach her.

## 2024-05-26 NOTE — Telephone Encounter (Signed)
 Amy from [facility] called requesting a protocol for the pt's shoulder, which can be faxed over. Fax number is [phone]. And plan of care pt is 1x wk for 3 wk. Medication interaction warning for Estradiol  and Lamotridine. Call back number is [phone]

## 2024-05-27 ENCOUNTER — Telehealth: Payer: Self-pay | Admitting: Surgical

## 2024-05-27 ENCOUNTER — Other Ambulatory Visit: Payer: Self-pay | Admitting: Surgical

## 2024-05-27 ENCOUNTER — Telehealth: Payer: Self-pay | Admitting: Orthopedic Surgery

## 2024-05-27 MED ORDER — OXYCODONE-ACETAMINOPHEN 10-325 MG PO TABS: 1.0000 | ORAL_TABLET | ORAL | 0 refills | Status: DC | PRN

## 2024-05-27 NOTE — Telephone Encounter (Signed)
 Maleigh from [employer] called and wanted to know how many tablets per 30 days is the provider requesting? If more than 360 tablets for 30 days they needs an explanation to why? CB#[phone] option 5.

## 2024-05-27 NOTE — Telephone Encounter (Signed)
 I called, medication has already been discontinued and [facility] sent in Oxycodone  10/325.  This has a limit of #180 in 30 days.

## 2024-05-27 NOTE — Telephone Encounter (Signed)
VM left with verbal orders.

## 2024-05-27 NOTE — Telephone Encounter (Signed)
 Shireen OT at [facility] called and wanted to know her outpatient start so they can discharge her. They also wants to add the Home health Aide til she does outpatient. CB#[phone]

## 2024-05-27 NOTE — Telephone Encounter (Signed)
 IC LMVM for [employer]. Verbal given.

## 2024-05-30 ENCOUNTER — Encounter: Payer: Self-pay | Admitting: Surgical

## 2024-05-30 ENCOUNTER — Ambulatory Visit: Admitting: Surgical

## 2024-05-30 ENCOUNTER — Other Ambulatory Visit: Payer: Self-pay

## 2024-05-30 DIAGNOSIS — Z96611 Presence of right artificial shoulder joint: Secondary | ICD-10-CM

## 2024-05-30 MED ORDER — OXYCODONE-ACETAMINOPHEN 10-325 MG PO TABS: 1.0000 | ORAL_TABLET | ORAL | 0 refills | Status: DC | PRN

## 2024-05-30 NOTE — Progress Notes (Signed)
 "  Post-Op Visit Note   Patient: Mariah Reeves           Date of Birth: Feb 03, 1966           MRN: [id number] Visit Date: 05/30/2024 PCP: Derick Leita POUR, MD   Assessment & Plan:  Chief Complaint:  Chief Complaint  Patient presents with   Right Shoulder - Routine Post Op    05/20/2024 Right RSA   Visit Diagnoses:  1. S/P reverse total shoulder arthroplasty, right     Plan: Mariah Reeves is a 58 y.o. female who presents s/p right reverse shoulder arthroplasty on 05/20/2024.  Patient is doing well and pain is overall controlled.  Using CPM machine as instructed.  Denies any chest pain, SOB, fevers, chills.  No complaint of any instability symptoms.  Having a lot of itching.  Pain is tolerable as long as she keeps on schedule with her Percocet 10 mg that she is taking on average 2 tablets every 4 hours.  Has been discontinuing the sling some.  She wants to go to physical therapy and [place] with her physical therapist Garrel..    On exam, patient has range of motion -20 degrees X rotation, 30 degrees abduction, 40 degrees forward elevation passively.  Intact EPL, FPL, finger abduction, finger adduction, pronation/supination, bicep, tricep, deltoid of operative extremity.  Axillary nerve intact with deltoid firing.  Incision is healing well without evidence of infection or dehiscence.  Incision was made sure to be covered with Steri-Strips from the proximal to distal aspect of the length of the incision.  2+ radial pulse of the operative extremity.  Lacking 20 degrees of elbow extension  Plan is discontinue sling.  Okay to very lightly lift with the operative extremity but no lifting anything heavier than a coffee cup or cell phone.  Start physical therapy to focus on passive range of motion and active assisted range of motion with deltoid isometrics.  Do not want to externally rotate past 30 degrees to protect subscapularis repair.  Strongly encourage patient to start with passive range of motion  exercises that she did with the occupational therapist at home and in the hospital.  Also needs to focus on getting her elbow as straight as possible since she is lacking about 20 degrees of full elbow extension.  Follow-up in 4 weeks for clinical recheck with Dr. Addie.  Follow-Up Instructions: No follow-ups on file.   Orders:  Orders Placed This Encounter  Procedures   XR Shoulder Right   Ambulatory referral to Physical Therapy   Meds ordered this encounter  Medications   oxyCODONE -acetaminophen  (PERCOCET) 10-325 MG tablet    Sig: Take 1-2 tablets by mouth every 4 (four) hours as needed for pain.    Dispense:  35 tablet    Refill:  0    For chronic pain as well as acute postop pain    Imaging: No results found.  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   Encounter for BiPAP use counseling 08/08/2021   Mass of upper inner quadrant of left breast 07/19/2021   Vaginal atrophy 07/19/2021   Family history of breast cancer 07/19/2021  OSA on CPAP 04/19/2020   CPAP use counseling 04/19/2020   Nephrolithiasis 01/26/2017   Left ureteral stone 01/03/2017   Endometrial disorder 07/08/2015   Bilateral occipital neuralgia 03/02/2015   DDD (degenerative disc disease), lumbar 12/24/2014   Facet syndrome, lumbar 12/24/2014   Sacroiliac joint dysfunction 12/24/2014   Past Medical History:  Diagnosis Date   Allergy    no current problems as of 05/16/24   Anemia 10/2023   hx iron infusion   Anomaly, uterus    tumor of uterus   Anxiety    Arthritis    Bilateral occipital neuralgia    Bipolar 1 disorder (HCC)    Chronic  back pain    Chronic myeloid leukemia (CML), BCR/ABL-positive (HCC) 08/06/2023   tx with Spry[facility]  at [facility]   Complication of anesthesia    hard time waking up after gastric bypass.Pt stated that she has a small mouth and has had several surgeries since gastric bypass with no problems.   DDD (degenerative disc disease), lumbar 12/24/2014   Depression    Facet syndrome, lumbar    Family history of breast cancer    2/21 cancer genetic testing letter sent   GERD (gastroesophageal reflux disease)    before gastric bypass   Headache    Migraines   Hearing loss    left ear - has hearing aid   History of kidney stones    Left   Hx of vertigo    Hypertension    Patella fracture    Left   PTSD (post-traumatic stress disorder)    Sleep apnea 08/08/2021   BiPAP - uses nightly   Stroke (HCC) 07/06/2022   TIA on 08/30/22   Wears glasses     Family History  Problem Relation Age of Onset   Hypertension Mother    Diabetes Mother    Heart disease Father    Alcohol abuse Father    Breast cancer Maternal Grandmother        not sure of age   Breast cancer Other    Breast cancer Other    Kidney cancer Neg Hx    Bladder Cancer Neg Hx     Past Surgical History:  Procedure Laterality Date   ABDOMINOPLASTY  2006   CARPAL TUNNEL RELEASE Left    CHOLECYSTECTOMY  2008   COLONOSCOPY W/ POLYPECTOMY     CYSTOSCOPY W/ URETERAL STENT PLACEMENT  01/03/2017   Procedure: CYSTOSCOPY WITH RETROGRADE PYELOGRAM/URETERAL STENT PLACEMENT;  Surgeon: Chauncey Redell Agent, MD;  Location: [facility] ORS;  Service: Urology;;   CYSTOSCOPY W/ URETERAL STENT PLACEMENT Left 01/17/2017   Procedure: CYSTOSCOPY WITH STENT REPLACEMENT;  Surgeon: Chauncey Redell Agent, MD;  Location: [facility] OR[facility];  Service: Urology;  Laterality: Left;   DILATATION & CURETTAGE/HYSTEROSCOPY WITH MYOSURE N/A 07/08/2015   Procedure: DILATATION & CURETTAGE/HYSTEROSCOPY WITH MYOSURE;  Surgeon: Lamar SHAUNNA Lesches, MD;  Location: [facility] ORS;  Service:  Gynecology;  Laterality: N/A;   DILATION AND CURETTAGE OF UTERUS     1/17   EXTRACORPOREAL SHOCK WAVE LITHOTRIPSY Left 12/07/2016   Procedure: EXTRACORPOREAL SHOCK WAVE LITHOTRIPSY (ESWL);  Surgeon: Chauncey Redell Agent, MD;  Location: [facility] ORS;  Service: Urology;  Laterality: Left;   GASTRIC BYPASS  2005   KNEE SURGERY Right    ACL- repair   LITHOTRIPSY     MIDDLE EAR SURGERY     NASAL SEPTUM SURGERY     OVARY SURGERY Right    REVERSE SHOULDER ARTHROPLASTY Right 05/20/2024   Procedure: RIGHT REVERSE  SHOULDER ARTHROPLASTY, BICEPS TENODESIS;  Surgeon: Addie Cordella Hamilton, MD;  Location: [facility] OR;  Service: Orthopedics;  Laterality: Right;   ROTATOR CUFF REPAIR Right 2023   [facility]   UPPER GI ENDOSCOPY     URETEROSCOPY WITH HOLMIUM LASER LITHOTRIPSY     perforated ureter. had nephrostomy tube for brief time   URETEROSCOPY WITH HOLMIUM LASER LITHOTRIPSY Left 01/17/2017   Procedure: URETEROSCOPY WITH HOLMIUM LASER LITHOTRIPSY;  Surgeon: Chauncey Redell Agent, MD;  Location: [facility] ORS;  Service: Urology;  Laterality: Left;   Social History   Occupational History   Not on file  Tobacco Use   Smoking status: Never   Smokeless tobacco: Never  Vaping Use   Vaping status: Never Used  Substance and Sexual Activity   Alcohol use: No   Drug use: No   Sexual activity: Not Currently    Birth control/protection: None, Post-menopausal    "

## 2024-06-01 DIAGNOSIS — M19011 Primary osteoarthritis, right shoulder: Secondary | ICD-10-CM

## 2024-06-02 ENCOUNTER — Other Ambulatory Visit: Payer: Self-pay | Admitting: Surgical

## 2024-06-03 ENCOUNTER — Ambulatory Visit: Admitting: Physical Therapy

## 2024-06-03 ENCOUNTER — Telehealth: Payer: Self-pay | Admitting: Orthopedic Surgery

## 2024-06-03 ENCOUNTER — Other Ambulatory Visit: Payer: Self-pay | Admitting: Physician Assistant

## 2024-06-03 MED ORDER — HYDROCODONE-ACETAMINOPHEN 5-325 MG PO TABS: 1.0000 | ORAL_TABLET | Freq: Three times a day (TID) | ORAL | 0 refills | Status: DC | PRN

## 2024-06-03 NOTE — Telephone Encounter (Signed)
 It appears that [facility] sent in #35 pills of percocet 10 yesterday to take 1-2 q4hrs.  I do not feel comfortable sending this in.  I have sent in a small rx for norco

## 2024-06-03 NOTE — Telephone Encounter (Signed)
 Pt states her pain medication will be due for a refill on Christmas 06/05/24 and she wants to know what she needs to do since the office will be closed.  Please call pt and advise

## 2024-06-04 ENCOUNTER — Ambulatory Visit: Admitting: Physical Therapy

## 2024-06-06 ENCOUNTER — Encounter: Payer: Self-pay | Admitting: Orthopedic Surgery

## 2024-06-09 ENCOUNTER — Other Ambulatory Visit: Payer: Self-pay | Admitting: Surgical

## 2024-06-09 MED ORDER — OXYCODONE-ACETAMINOPHEN 10-325 MG PO TABS: 1.0000 | ORAL_TABLET | Freq: Four times a day (QID) | ORAL | 0 refills | Status: DC | PRN

## 2024-06-09 NOTE — Telephone Encounter (Signed)
 thx

## 2024-06-09 NOTE — Telephone Encounter (Signed)
 Hi Betsy, can you send a message to joys pain management provider.  Can you just tell him that we have essentially provided her with about 20 mg of oxycodone  on every 4 hours for pain control and I am trying to decrease that now to 20 mg oxycodone  every 6 hours for pain control.  I think she was originally on 15 mg oxycodone  every 6 hours so it would be best to continue tapering to that amount.  I think she can resume with having Dr. Elnor manage her pain at this point unless he has any other concerns or would prefer to wait until 6 weeks postop.  Thank you for all your help, Herlene

## 2024-06-09 NOTE — Telephone Encounter (Signed)
 Sent in and sent another MyChart message to her

## 2024-06-11 ENCOUNTER — Other Ambulatory Visit: Payer: Self-pay | Admitting: Surgical

## 2024-06-11 ENCOUNTER — Encounter: Payer: Self-pay | Admitting: Physical Therapy

## 2024-06-11 ENCOUNTER — Telehealth: Payer: Self-pay

## 2024-06-11 ENCOUNTER — Other Ambulatory Visit: Payer: Self-pay | Admitting: Orthopedic Surgery

## 2024-06-11 ENCOUNTER — Ambulatory Visit: Admitting: Physical Therapy

## 2024-06-11 ENCOUNTER — Telehealth: Payer: Self-pay | Admitting: Orthopedic Surgery

## 2024-06-11 DIAGNOSIS — M6281 Muscle weakness (generalized): Secondary | ICD-10-CM | POA: Insufficient documentation

## 2024-06-11 DIAGNOSIS — G8929 Other chronic pain: Secondary | ICD-10-CM | POA: Insufficient documentation

## 2024-06-11 DIAGNOSIS — Z96611 Presence of right artificial shoulder joint: Secondary | ICD-10-CM | POA: Insufficient documentation

## 2024-06-11 DIAGNOSIS — M25511 Pain in right shoulder: Secondary | ICD-10-CM | POA: Diagnosis present

## 2024-06-11 DIAGNOSIS — M25611 Stiffness of right shoulder, not elsewhere classified: Secondary | ICD-10-CM | POA: Diagnosis present

## 2024-06-11 DIAGNOSIS — R293 Abnormal posture: Secondary | ICD-10-CM | POA: Insufficient documentation

## 2024-06-11 MED ORDER — OXYCODONE-ACETAMINOPHEN 10-325 MG PO TABS: 1.0000 | ORAL_TABLET | Freq: Four times a day (QID) | ORAL | 0 refills | Status: DC | PRN

## 2024-06-11 NOTE — Therapy (Addendum)
 " OUTPATIENT PHYSICAL THERAPY SHOULDER EVALUATION  Patient Name: Mariah Reeves MRN: [id number] DOB:02/18/1966, 58 y.o., female Today's Date: 06/11/2024  END OF SESSION:  PT End of Session - 06/11/24 1956     Visit Number 1    Number of Visits 16    Date for Recertification  08/06/24    PT Start Time 1028    PT Stop Time 1117    PT Time Calculation (min) 49 min          Past Medical History:  Diagnosis Date   Allergy    no current problems as of 05/16/24   Anemia 10/2023   hx iron infusion   Anomaly, uterus    tumor of uterus   Anxiety    Arthritis    Bilateral occipital neuralgia    Bipolar 1 disorder (HCC)    Chronic back pain    Chronic myeloid leukemia (CML), 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   DDD (degenerative disc disease), lumbar 12/24/2014   Depression    Facet syndrome, lumbar    Family history of breast cancer    2/21 cancer genetic testing letter sent   GERD (gastroesophageal reflux disease)    before gastric bypass   Headache    Migraines   Hearing loss    left ear - has hearing aid   History of kidney stones    Left   Hx of vertigo    Hypertension    Patella fracture    Left   PTSD (post-traumatic stress disorder)    Sleep apnea 08/08/2021   BiPAP - uses nightly   Stroke (HCC) 07/06/2022   TIA on 08/30/22   Wears glasses    Past Surgical History:  Procedure Laterality Date   ABDOMINOPLASTY  2006   CARPAL TUNNEL RELEASE Left    CHOLECYSTECTOMY  2008   COLONOSCOPY W/ POLYPECTOMY     CYSTOSCOPY W/ URETERAL STENT PLACEMENT  01/03/2017   Procedure: CYSTOSCOPY WITH RETROGRADE PYELOGRAM/URETERAL STENT PLACEMENT;  Surgeon: Chauncey Redell Agent, MD;  Location: [facility];  Service: Urology;;   CYSTOSCOPY W/ URETERAL STENT PLACEMENT Left  01/17/2017   Procedure: CYSTOSCOPY WITH STENT REPLACEMENT;  Surgeon: Chauncey Redell Agent, MD;  Location: [facility];  Service: Urology;  Laterality: Left;   DILATATION & CURETTAGE/HYSTEROSCOPY WITH MYOSURE N/A 07/08/2015   Procedure: DILATATION & CURETTAGE/HYSTEROSCOPY WITH MYOSURE;  Surgeon: Lamar SHAUNNA Lesches, MD;  Location: [facility];  Service: Gynecology;  Laterality: N/A;   DILATION AND CURETTAGE OF UTERUS     1/17   EXTRACORPOREAL SHOCK WAVE LITHOTRIPSY Left 12/07/2016   Procedure: EXTRACORPOREAL SHOCK WAVE LITHOTRIPSY (ESWL);  Surgeon: Chauncey Redell Agent, MD;  Location: [facility];  Service: Urology;  Laterality: Left;   GASTRIC BYPASS  2005   KNEE SURGERY Right    ACL- repair   LITHOTRIPSY     MIDDLE EAR SURGERY     NASAL SEPTUM SURGERY     OVARY SURGERY Right    REVERSE SHOULDER ARTHROPLASTY Right 05/20/2024   Procedure: RIGHT REVERSE SHOULDER ARTHROPLASTY, BICEPS TENODESIS;  Surgeon: Addie Cordella Hamilton, MD;  Location: [facility];  Service: Orthopedics;  Laterality: Right;   ROTATOR CUFF REPAIR Right 2023   [facility]   UPPER GI ENDOSCOPY     URETEROSCOPY WITH HOLMIUM LASER  LITHOTRIPSY     perforated ureter. 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/06/2023   Essential hypertension 04/04/2023   TIA (transient ischemic attack) 08/30/2022   Stroke (HCC) 07/06/2022   Complex sleep apnea syndrome 08/08/2021   Encounter for BiPAP use counseling 08/08/2021   Mass of upper inner quadrant of left breast  07/19/2021   Vaginal atrophy 07/19/2021   Family history of breast cancer 07/19/2021   OSA on CPAP 04/19/2020   CPAP use counseling 04/19/2020   Nephrolithiasis 01/26/2017   Left ureteral stone 01/03/2017   Endometrial disorder 07/08/2015   Bilateral occipital neuralgia 03/02/2015   DDD (degenerative disc disease), lumbar 12/24/2014   Facet syndrome, lumbar 12/24/2014   Sacroiliac joint dysfunction 12/24/2014   PCP: Derick Leita POUR, MD  REFERRING PROVIDER: Shirly Carlin CROME, PA-C  REFERRING DIAG:  Diagnosis  [phone] (ICD-10-CM) - S/P reverse total shoulder arthroplasty, right    THERAPY DIAG:  S/P reverse total shoulder arthroplasty, right  Chronic right shoulder pain  Muscle weakness (generalized)  Shoulder joint stiffness, right  Abnormal posture  Rationale for Evaluation and Treatment: Rehabilitation  ONSET DATE: 07/30/23  Fall resulting in R proximal humerus fx.  05/20/24 surgery date  SUBJECTIVE:                                                                                                                                                                                       SUBJECTIVE STATEMENT: Pt. Fell on 2/17 while bring the trash bin in resulting in R proximal humerus fracture.  Pt. States the last x-ray revealed fracture is healed.  Pt. Reports 7/10 R shoulder pain at rest and using 2 Oxy 10 mg. Every 6 hours. Pt. Currently has no HEP and is just using it.  No sling use and pt. Planning to RTW on 06/16/24.  No recent falls and pt. Instructed to not pick up anything heavier than a cell phone.  MD f/u on 06/30/24.  Pt. 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   Hand dominance: Right   PERTINENT HISTORY: Pt. Well known to PT clinic.  See MD notes.    PAIN:  Are you having pain? Yes: NPRS scale: 7/10  Pain location: R proximal shoulder Pain description: aching/ sharp Aggravating factors: movement Relieving factors: meds/ rest   PRECAUTIONS:  Shoulder/ lifting   RED FLAGS: None      WEIGHT BEARING RESTRICTIONS: No   FALLS:  Has patient fallen in last 6 months? Yes. Number of falls 1 with injury (no falls in past 6 months.)   LIVING ENVIRONMENT: Lives with: lives alone Has following equipment at home: None   OCCUPATION: Editor, commissioning at [employer]   PLOF: Independent   PATIENT GOALS: Increase R shoulder ROM/ strength/ return to PLOF   NEXT MD VISIT: 06/30/24   OBJECTIVE:  Note: Objective measures were completed at Evaluation unless otherwise noted.   DIAGNOSTIC FINDINGS:  See imaging   PATIENT SURVEYS:  Quick Dash 86.4% significant limitations   COGNITION: Overall cognitive status: Within functional limits for tasks assessed                                  SENSATION: WFL   POSTURE: Forward head/ rounded shoulder posture   UPPER EXTREMITY ROM:    Active ROM Right A/PROM eval Left AROM eval  Shoulder flexion 87/96 deg. 150 deg.  Shoulder extension      Shoulder abduction 67/72 deg. 152 deg.  Shoulder adduction      Shoulder internal rotation 48/62 deg. 82 deg.  Shoulder external rotation -8/0 deg. 66 deg.  Elbow flexion [facility] WFL  Elbow extension      Wrist flexion      Wrist extension      Wrist ulnar deviation      Wrist radial deviation      Wrist pronation      Wrist supination limited    (Blank rows = not tested)  Good C-spine AROM, except L/R lateral flexion (limited 50%).     UPPER EXTREMITY MMT:   MMT Right eval Left eval  Shoulder flexion Unable to test R UE TBD   Shoulder extension      Shoulder abduction      Shoulder adduction      Shoulder internal rotation      Shoulder external rotation      Middle trapezius      Lower trapezius      Elbow flexion      Elbow extension      Wrist flexion      Wrist extension      Wrist ulnar deviation      Wrist radial deviation      Wrist pronation      Wrist supination      Grip strength (lbs) 14.1# 20.6%  (Blank  rows = not tested)   SHOULDER SPECIAL TESTS: NT   JOINT MOBILITY TESTING:  Guarded movement.   Good incision healing with 1 scab/ no steristrips/ hard scar.  Discussed scar massage/ use of lotion.     PALPATION:  Minimal tenderness over incision.  TREATMENT DATE: 06/11/24   See evaluation  There.ex.:   See HEP (handouts issued)  Manual tx.:  Supine R shoulder PROM (all planes)- discussed scar massage.    PATIENT EDUCATION: Education details: See HEP Person educated: Patient Education method: Explanation, Demonstration, and Handouts Education comprehension: verbalized understanding and returned demonstration   HOME EXERCISE PROGRAM: Access Code: [email or web address] URL: [email or web address] Date: 06/11/2024 Prepared by: Ozell Sero  Exercises - Seated Shoulder Flexion AAROM with Pulley Behind  - 2-3 x daily - 7 x weekly - 1 sets - 20 reps - Seated Shoulder Abduction AAROM with Pulley Behind  - 2-3 x daily - 7 x weekly - 1 sets - 20 reps - Seated Scapular Retraction  - 2-3 x daily - 7 x weekly - 1 sets - 20 reps - Supine Shoulder Press with Dowel  - 2-3 x daily - 7 x weekly - 1 sets - 20 reps - Circular Shoulder Pendulum with Table Support  - 1 x daily - 7 x weekly - 1 sets - 10 reps   ASSESSMENT:   CLINICAL IMPRESSION: Pt. is a pleasant 58 y.o. female who was seen today for physical therapy evaluation and treatment for R reverse total shoulder replacement.  Pt. Presents with limited R shoulder A/PROM and moderate c/o pain.  Pt. Presents with good incision healing and instructed on scar massage and updated HEP.   Pt. Will benefit from skilled PT services to increase R shoulder ROM/ strength to improve overhead reaching/ pain-free mobility.     OBJECTIVE IMPAIRMENTS: decreased activity tolerance, decreased mobility, decreased ROM,  decreased strength, hypomobility, impaired flexibility, impaired UE functional use, improper body mechanics, postural dysfunction, and pain.    ACTIVITY LIMITATIONS: carrying, lifting, bathing, toileting, dressing, self feeding, reach over head, and hygiene/grooming   PARTICIPATION LIMITATIONS: cleaning, laundry, driving, shopping, community activity, and occupation   PERSONAL FACTORS: Fitness and Past/current experiences are also affecting patient's functional outcome.    REHAB POTENTIAL: Good   CLINICAL DECISION MAKING: Evolving/moderate complexity   EVALUATION COMPLEXITY: Moderate     GOALS: Goals reviewed with patient? Yes   SHORT TERM GOALS: Target date: 07/02/24   Pt. independent with HEP to increase R shoulder PROM to [facility] as compared to L shoulder to progress to AROM/ ADL.   Baseline: see above Goal status: INITIAL   LONG TERM GOALS: Target date: 08/06/24   Pt. Will decrease QuickDASH to <50% to improve UE functional mobility.   Baseline:  86.4% (significant limitations). Goal status: INITIAL   2.  Pt. Will increase R shoulder flexion AROM to >120 deg. To reach overhead/ manage hair.   Baseline:  limited R sh. AROM at time of evaluation Goal status: INITIAL   3.  Pt. Will present with 4/5 MMT in R UE to improve daily household/ work-related tasks.   Baseline:  No MMT at time of evaluation Goal status: INITIAL   4.  Pt. Will report no R shoulder pain/limitations with daily tasks to improve return to PLOF.   Baseline:  R shoulder pain/ limited.  Goal status: INITIAL     PLAN:   PT FREQUENCY: 2x/week   PT DURATION: 8 weeks   PLANNED INTERVENTIONS: 97110-Therapeutic exercises, 97530- Therapeutic activity, [id number]- Neuromuscular re-education, 97535- Self Care, [id number]- Manual therapy, [id number]- Electrical stimulation (unattended), Patient/Family education, Joint mobilization, Cryotherapy, and Moist heat   PLAN FOR NEXT SESSION: Reassess R shoulder swelling/ ROM.  Review  HEP  Ozell JAYSON Sero, PT, DPT # [phone] 06/11/2024, 7:58 PM  "

## 2024-06-11 NOTE — Telephone Encounter (Signed)
 Pt called asking for refill of oxy before weekend. Please send to pharmacy on file. Pt number is [phone].

## 2024-06-11 NOTE — Telephone Encounter (Signed)
-----   Message from [place] sent at 06/11/2024  2:07 PM EST ----- Regarding: Work Note  needed to return on 06-14-23 with restrictions Tinnie   Needing a work note for this patient to go back to work on 06-13-24 with restrictions. 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 I will be getting a email from her to know where to send the note for her work place. Thx Tisha

## 2024-06-11 NOTE — Telephone Encounter (Signed)
 Work note done

## 2024-06-11 NOTE — Telephone Encounter (Signed)
Med sent. thanks!

## 2024-06-11 NOTE — Telephone Encounter (Signed)
 Okay to return to work 1-26 with no lifting more than 2 pounds with the right arm for the first 4 weeks she is back at work.

## 2024-06-14 NOTE — Addendum Note (Signed)
 Addended by: ILEENE OZELL BROCKS on: 06/14/2024 07:41 PM   Modules accepted: Orders

## 2024-06-15 ENCOUNTER — Encounter: Payer: Self-pay | Admitting: Orthopedic Surgery

## 2024-06-16 ENCOUNTER — Other Ambulatory Visit: Payer: Self-pay | Admitting: Surgical

## 2024-06-16 MED ORDER — OXYCODONE-ACETAMINOPHEN 10-325 MG PO TABS: 1.0000 | ORAL_TABLET | Freq: Four times a day (QID) | ORAL | 0 refills | Status: DC | PRN

## 2024-06-19 ENCOUNTER — Other Ambulatory Visit: Payer: Self-pay | Admitting: Surgical

## 2024-06-19 MED ORDER — OXYCODONE-ACETAMINOPHEN 10-325 MG PO TABS: 1.0000 | ORAL_TABLET | Freq: Four times a day (QID) | ORAL | 0 refills | Status: DC | PRN

## 2024-06-24 ENCOUNTER — Ambulatory Visit: Attending: Orthopedic Surgery | Admitting: Physical Therapy

## 2024-06-24 ENCOUNTER — Encounter: Payer: Self-pay | Admitting: Physical Therapy

## 2024-06-24 DIAGNOSIS — M25511 Pain in right shoulder: Secondary | ICD-10-CM | POA: Diagnosis present

## 2024-06-24 DIAGNOSIS — M6281 Muscle weakness (generalized): Secondary | ICD-10-CM | POA: Diagnosis present

## 2024-06-24 DIAGNOSIS — M25611 Stiffness of right shoulder, not elsewhere classified: Secondary | ICD-10-CM | POA: Insufficient documentation

## 2024-06-24 DIAGNOSIS — R293 Abnormal posture: Secondary | ICD-10-CM | POA: Diagnosis present

## 2024-06-24 DIAGNOSIS — Z96611 Presence of right artificial shoulder joint: Secondary | ICD-10-CM | POA: Insufficient documentation

## 2024-06-24 DIAGNOSIS — G8929 Other chronic pain: Secondary | ICD-10-CM | POA: Insufficient documentation

## 2024-06-24 NOTE — Therapy (Signed)
 " OUTPATIENT PHYSICAL THERAPY SHOULDER TREATMENT  Patient Name: Mariah Reeves MRN: [id number] DOB:1965/10/12, 59 y.o., female Today's Date: 06/24/2024  END OF SESSION:  PT End of Session - 06/24/24 1244     Visit Number 2    Number of Visits 16    Date for Recertification  08/06/24    PT Start Time 0740    PT Stop Time 0821    PT Time Calculation (min) 41 min    Activity Tolerance Patient tolerated treatment well;Patient limited by pain          Past Medical History:  Diagnosis Date   Allergy    no current problems as of 05/16/24   Anemia 10/2023   hx iron infusion   Anomaly, uterus    tumor of uterus   Anxiety    Arthritis    Bilateral occipital neuralgia    Bipolar 1 disorder (HCC)    Chronic back pain    Chronic myeloid leukemia (CML), 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   DDD (degenerative disc disease), lumbar 12/24/2014   Depression    Facet syndrome, lumbar    Family history of breast cancer    2/21 cancer genetic testing letter sent   GERD (gastroesophageal reflux disease)    before gastric bypass   Headache    Migraines   Hearing loss    left ear - has hearing aid   History of kidney stones    Left   Hx of vertigo    Hypertension    Patella fracture    Left   PTSD (post-traumatic stress disorder)    Sleep apnea 08/08/2021   BiPAP - uses nightly   Stroke (HCC) 07/06/2022   TIA on 08/30/22   Wears glasses    Past Surgical History:  Procedure Laterality Date   ABDOMINOPLASTY  2006   CARPAL TUNNEL RELEASE Left    CHOLECYSTECTOMY  2008   COLONOSCOPY W/ POLYPECTOMY     CYSTOSCOPY W/ URETERAL STENT PLACEMENT  01/03/2017   Procedure: CYSTOSCOPY WITH RETROGRADE PYELOGRAM/URETERAL STENT PLACEMENT;  Surgeon: Chauncey Redell Agent, MD;  Location:  [facility];  Service: Urology;;   CYSTOSCOPY W/ URETERAL STENT PLACEMENT Left 01/17/2017   Procedure: CYSTOSCOPY WITH STENT REPLACEMENT;  Surgeon: Chauncey Redell Agent, MD;  Location: [facility];  Service: Urology;  Laterality: Left;   DILATATION & CURETTAGE/HYSTEROSCOPY WITH MYOSURE N/A 07/08/2015   Procedure: DILATATION & CURETTAGE/HYSTEROSCOPY WITH MYOSURE;  Surgeon: Lamar SHAUNNA Lesches, MD;  Location: [facility];  Service: Gynecology;  Laterality: N/A;   DILATION AND CURETTAGE OF UTERUS     1/17   EXTRACORPOREAL SHOCK WAVE LITHOTRIPSY Left 12/07/2016   Procedure: EXTRACORPOREAL SHOCK WAVE LITHOTRIPSY (ESWL);  Surgeon: Chauncey Redell Agent, MD;  Location: [facility];  Service: Urology;  Laterality: Left;   GASTRIC BYPASS  2005   KNEE SURGERY Right    ACL- repair   LITHOTRIPSY     MIDDLE EAR SURGERY     NASAL SEPTUM SURGERY     OVARY SURGERY Right    REVERSE SHOULDER ARTHROPLASTY Right 05/20/2024   Procedure: RIGHT REVERSE SHOULDER ARTHROPLASTY, BICEPS TENODESIS;  Surgeon: Addie Cordella Hamilton, MD;  Location: [facility];  Service: Orthopedics;  Laterality: Right;   ROTATOR CUFF REPAIR Right 2023   [facility]  UPPER GI ENDOSCOPY     URETEROSCOPY WITH HOLMIUM LASER LITHOTRIPSY     perforated ureter. 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/06/2023   Essential hypertension 04/04/2023   TIA (transient ischemic attack) 08/30/2022   Stroke (HCC) 07/06/2022   Complex sleep apnea syndrome 08/08/2021   Encounter for BiPAP  use counseling 08/08/2021   Mass of upper inner quadrant of left breast 07/19/2021   Vaginal atrophy 07/19/2021   Family history of breast cancer 07/19/2021   OSA on CPAP 04/19/2020   CPAP use counseling 04/19/2020   Nephrolithiasis 01/26/2017   Left ureteral stone 01/03/2017   Endometrial disorder 07/08/2015   Bilateral occipital neuralgia 03/02/2015   DDD (degenerative disc disease), lumbar 12/24/2014   Facet syndrome, lumbar 12/24/2014   Sacroiliac joint dysfunction 12/24/2014   PCP: Derick Leita POUR, MD  REFERRING PROVIDER: Shirly Carlin CROME, PA-C  REFERRING DIAG:  Diagnosis  [phone] (ICD-10-CM) - S/P reverse total shoulder arthroplasty, right    THERAPY DIAG:  S/P reverse total shoulder arthroplasty, right  Chronic right shoulder pain  Muscle weakness (generalized)  Shoulder joint stiffness, right  Abnormal posture  Rationale for Evaluation and Treatment: Rehabilitation  ONSET DATE: 07/30/23  Fall resulting in R proximal humerus fx.  05/20/24 surgery date  SUBJECTIVE:                                                                                                                                                                                       SUBJECTIVE STATEMENT: Pt. Fell on 2/17 while bring the trash bin in resulting in R proximal humerus fracture.  Pt. States the last x-ray revealed fracture is healed.  Pt. Reports 7/10 R shoulder pain at rest and using 2 Oxy 10 mg. Every 6 hours. Pt. Currently has no HEP and is just using it.  No sling use and pt. Planning to RTW on 06/16/24.  No recent falls and pt. Instructed to not pick up anything heavier than a cell phone.  MD f/u on 06/30/24.  Pt. 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   Hand dominance: Right   PERTINENT HISTORY: Pt. Well known to PT clinic.  See MD notes.  PAIN:  Are you having pain? Yes: NPRS scale: 7/10 Pain location: R proximal shoulder Pain description: aching/  sharp Aggravating factors: movement Relieving factors: meds/ rest   PRECAUTIONS: Shoulder/ lifting   RED FLAGS: None      WEIGHT BEARING RESTRICTIONS: No   FALLS:  Has patient fallen in last 6 months? Yes. Number of falls 1 with injury (no falls in past 6 months.)   LIVING ENVIRONMENT: Lives with: lives alone Has following equipment at home: None   OCCUPATION: Editor, commissioning at [employer]   PLOF: Independent   PATIENT GOALS: Increase R shoulder ROM/ strength/ return to PLOF   NEXT MD VISIT: 06/30/24   OBJECTIVE:  Note: Objective measures were completed at Evaluation unless otherwise noted.   DIAGNOSTIC FINDINGS:  See imaging   PATIENT SURVEYS:  Quick Dash 86.4% significant limitations   COGNITION: Overall cognitive status: Within functional limits for tasks assessed                                  SENSATION: WFL   POSTURE: Forward head/ rounded shoulder posture   UPPER EXTREMITY ROM:    Active ROM Right A/PROM eval Left AROM eval  Shoulder flexion 87/96 deg. 150 deg.  Shoulder extension      Shoulder abduction 67/72 deg. 152 deg.  Shoulder adduction      Shoulder internal rotation 48/62 deg. 82 deg.  Shoulder external rotation -8/0 deg. 66 deg.  Elbow flexion [facility] WFL  Elbow extension      Wrist flexion      Wrist extension      Wrist ulnar deviation      Wrist radial deviation      Wrist pronation      Wrist supination limited    (Blank rows = not tested)  Good C-spine AROM, except L/R lateral flexion (limited 50%).     UPPER EXTREMITY MMT:   MMT Right eval Left eval  Shoulder flexion Unable to test R UE TBD   Shoulder extension      Shoulder abduction      Shoulder adduction      Shoulder internal rotation      Shoulder external rotation      Middle trapezius      Lower trapezius      Elbow flexion      Elbow extension      Wrist flexion      Wrist extension      Wrist ulnar deviation      Wrist radial deviation       Wrist pronation      Wrist supination      Grip strength (lbs) 14.1# 20.6%  (Blank rows = not tested)   SHOULDER SPECIAL TESTS: NT   JOINT MOBILITY TESTING:  Guarded movement.   Good incision healing with 1 scab/ no steristrips/ hard scar.  Discussed scar massage/ use of lotion.     PALPATION:  Minimal tenderness over incision.  TREATMENT DATE: 06/24/2024   Subjective:  Pt. Reports high levels of R shoulder pain with reaching/ daily activities and requires pain meds to manage.  Pt. Reports limited compliance with use of pulley/ HEP at this time.  Pt. Has returned to working as runner, broadcasting/film/video at [employer].    There.ex.:  Standing wall ladder: R shoulder flexion (marked with sticker)- 8x.  Standing B shoulder A/AROM in front of mirror (flexion/ abduction/ ER/ extension)- pain limited.    Supine wand ex.: chest press/ shoulder flexion 10x2  Supine R serratus punches 10x2.  Limited scapular mobility.    Reviewed HEP  Manual tx.:    Supine R shoulder PROM (all planes)-10 each.    Reviewed scar massage.  No scabs presents but marked thicken on incision noted.  Pt. Very tender/ pain limited with palpation.       PATIENT EDUCATION: Education details: See HEP Person educated: Patient Education method: Explanation, Demonstration, and Handouts Education comprehension: verbalized understanding and returned demonstration   HOME EXERCISE PROGRAM: Access Code: [email or web address] URL: [email or web address] Date: 06/11/2024 Prepared by: Ozell Sero  Exercises - Seated Shoulder Flexion AAROM with Pulley Behind  - 2-3 x daily - 7 x weekly - 1 sets - 20 reps - Seated Shoulder Abduction AAROM with Pulley Behind  - 2-3 x daily - 7 x weekly - 1 sets - 20 reps - Seated Scapular Retraction  - 2-3 x daily - 7 x weekly - 1 sets - 20 reps - Supine  Shoulder Press with Dowel  - 2-3 x daily - 7 x weekly - 1 sets - 20 reps - Circular Shoulder Pendulum with Table Support  - 1 x daily - 7 x weekly - 1 sets - 10 reps   ASSESSMENT:   CLINICAL IMPRESSION: Pt. Presents with limited R shoulder A/PROM and moderate c/o pain.  Pt. Presents with good incision healing and instructed on scar massage and reviewed HEP.  Pt. Limited to 90 deg. R shoulder flexion due to stiffness/ pain.  Pt. Instructed to focus on HEP daily with focus on R shoulder ROM.  Pt. Will benefit from skilled PT services to increase R shoulder ROM/ strength to improve overhead reaching/ pain-free mobility.     OBJECTIVE IMPAIRMENTS: decreased activity tolerance, decreased mobility, decreased ROM, decreased strength, hypomobility, impaired flexibility, impaired UE functional use, improper body mechanics, postural dysfunction, and pain.    ACTIVITY LIMITATIONS: carrying, lifting, bathing, toileting, dressing, self feeding, reach over head, and hygiene/grooming   PARTICIPATION LIMITATIONS: cleaning, laundry, driving, shopping, community activity, and occupation   PERSONAL FACTORS: Fitness and Past/current experiences are also affecting patient's functional outcome.    REHAB POTENTIAL: Good   CLINICAL DECISION MAKING: Evolving/moderate complexity   EVALUATION COMPLEXITY: Moderate     GOALS: Goals reviewed with patient? Yes   SHORT TERM GOALS: Target date: 07/02/24   Pt. independent with HEP to increase R shoulder PROM to [facility] as compared to L shoulder to progress to AROM/ ADL.   Baseline: see above Goal status: INITIAL   LONG TERM GOALS: Target date: 08/06/24   Pt. Will decrease QuickDASH to <50% to improve UE functional mobility.   Baseline:  86.4% (significant limitations). Goal status: INITIAL   2.  Pt. Will increase R shoulder flexion AROM to >120 deg. To reach overhead/ manage hair.   Baseline:  limited R sh. AROM at time of evaluation Goal status: INITIAL   3.  Pt.  Will present with 4/5 MMT in R UE to improve daily household/  work-related tasks.   Baseline:  No MMT at time of evaluation Goal status: INITIAL   4.  Pt. Will report no R shoulder pain/limitations with daily tasks to improve return to PLOF.   Baseline:  R shoulder pain/ limited.  Goal status: INITIAL     PLAN:   PT FREQUENCY: 2x/week   PT DURATION: 8 weeks   PLANNED INTERVENTIONS: 97110-Therapeutic exercises, 97530- Therapeutic activity, [id number]- Neuromuscular re-education, 97535- Self Care, [id number]- Manual therapy, [id number]- Electrical stimulation (unattended), Patient/Family education, Joint mobilization, Cryotherapy, and Moist heat   PLAN FOR NEXT SESSION: Reassess R shoulder swelling/ ROM.  Review HEP  Ozell JAYSON Sero, PT, DPT # [phone] 06/24/2024, 12:47 PM  "

## 2024-06-26 ENCOUNTER — Other Ambulatory Visit: Payer: Self-pay | Admitting: Surgical

## 2024-06-26 ENCOUNTER — Ambulatory Visit: Admitting: Physical Therapy

## 2024-06-26 DIAGNOSIS — G8929 Other chronic pain: Secondary | ICD-10-CM

## 2024-06-26 DIAGNOSIS — Z96611 Presence of right artificial shoulder joint: Secondary | ICD-10-CM | POA: Diagnosis not present

## 2024-06-26 DIAGNOSIS — M6281 Muscle weakness (generalized): Secondary | ICD-10-CM

## 2024-06-26 DIAGNOSIS — M25611 Stiffness of right shoulder, not elsewhere classified: Secondary | ICD-10-CM

## 2024-06-26 DIAGNOSIS — R293 Abnormal posture: Secondary | ICD-10-CM

## 2024-06-26 MED ORDER — OXYCODONE-ACETAMINOPHEN 10-325 MG PO TABS: 1.0000 | ORAL_TABLET | Freq: Four times a day (QID) | ORAL | 0 refills | Status: AC | PRN

## 2024-06-26 NOTE — Therapy (Signed)
 " OUTPATIENT PHYSICAL THERAPY SHOULDER TREATMENT  Patient Name: Mariah Reeves MRN: [id number] DOB:Nov 10, 1965, 59 y.o., female Today's Date: 06/26/2024  END OF SESSION:  PT End of Session - 06/26/24 0745     Visit Number 3    Number of Visits 16    Date for Recertification  08/06/24    PT Start Time 0746    PT Stop Time 0834    PT Time Calculation (min) 48 min          Past Medical History:  Diagnosis Date   Allergy    no current problems as of 05/16/24   Anemia 10/2023   hx iron infusion   Anomaly, uterus    tumor of uterus   Anxiety    Arthritis    Bilateral occipital neuralgia    Bipolar 1 disorder (HCC)    Chronic back pain    Chronic myeloid leukemia (CML), 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   DDD (degenerative disc disease), lumbar 12/24/2014   Depression    Facet syndrome, lumbar    Family history of breast cancer    2/21 cancer genetic testing letter sent   GERD (gastroesophageal reflux disease)    before gastric bypass   Headache    Migraines   Hearing loss    left ear - has hearing aid   History of kidney stones    Left   Hx of vertigo    Hypertension    Patella fracture    Left   PTSD (post-traumatic stress disorder)    Sleep apnea 08/08/2021   BiPAP - uses nightly   Stroke (HCC) 07/06/2022   TIA on 08/30/22   Wears glasses    Past Surgical History:  Procedure Laterality Date   ABDOMINOPLASTY  2006   CARPAL TUNNEL RELEASE Left    CHOLECYSTECTOMY  2008   COLONOSCOPY W/ POLYPECTOMY     CYSTOSCOPY W/ URETERAL STENT PLACEMENT  01/03/2017   Procedure: CYSTOSCOPY WITH RETROGRADE PYELOGRAM/URETERAL STENT PLACEMENT;  Surgeon: Chauncey Redell Agent, MD;  Location: [facility] OR[facility];  Service: Urology;;   CYSTOSCOPY W/ URETERAL STENT PLACEMENT Left  01/17/2017   Procedure: CYSTOSCOPY WITH STENT REPLACEMENT;  Surgeon: Chauncey Redell Agent, MD;  Location: [facility];  Service: Urology;  Laterality: Left;   DILATATION & CURETTAGE/HYSTEROSCOPY WITH MYOSURE N/A 07/08/2015   Procedure: DILATATION & CURETTAGE/HYSTEROSCOPY WITH MYOSURE;  Surgeon: Lamar SHAUNNA Lesches, MD;  Location: [facility];  Service: Gynecology;  Laterality: N/A;   DILATION AND CURETTAGE OF UTERUS     1/17   EXTRACORPOREAL SHOCK WAVE LITHOTRIPSY Left 12/07/2016   Procedure: EXTRACORPOREAL SHOCK WAVE LITHOTRIPSY (ESWL);  Surgeon: Chauncey Redell Agent, MD;  Location: [facility];  Service: Urology;  Laterality: Left;   GASTRIC BYPASS  2005   KNEE SURGERY Right    ACL- repair   LITHOTRIPSY     MIDDLE EAR SURGERY     NASAL SEPTUM SURGERY     OVARY SURGERY Right    REVERSE SHOULDER ARTHROPLASTY Right 05/20/2024   Procedure: RIGHT REVERSE SHOULDER ARTHROPLASTY, BICEPS TENODESIS;  Surgeon: Addie Cordella Hamilton, MD;  Location: [facility];  Service: Orthopedics;  Laterality: Right;   ROTATOR CUFF REPAIR Right 2023   [facility]   UPPER GI ENDOSCOPY     URETEROSCOPY WITH HOLMIUM LASER  LITHOTRIPSY     perforated ureter. 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/06/2023   Essential hypertension 04/04/2023   TIA (transient ischemic attack) 08/30/2022   Stroke (HCC) 07/06/2022   Complex sleep apnea syndrome 08/08/2021   Encounter for BiPAP use counseling 08/08/2021   Mass of upper inner quadrant of left breast  07/19/2021   Vaginal atrophy 07/19/2021   Family history of breast cancer 07/19/2021   OSA on CPAP 04/19/2020   CPAP use counseling 04/19/2020   Nephrolithiasis 01/26/2017   Left ureteral stone 01/03/2017   Endometrial disorder 07/08/2015   Bilateral occipital neuralgia 03/02/2015   DDD (degenerative disc disease), lumbar 12/24/2014   Facet syndrome, lumbar 12/24/2014   Sacroiliac joint dysfunction 12/24/2014   PCP: Derick Leita POUR, MD  REFERRING PROVIDER: Shirly Carlin CROME, PA-C  REFERRING DIAG:  Diagnosis  [phone] (ICD-10-CM) - S/P reverse total shoulder arthroplasty, right    THERAPY DIAG:  S/P reverse total shoulder arthroplasty, right  Chronic right shoulder pain  Muscle weakness (generalized)  Shoulder joint stiffness, right  Abnormal posture  Rationale for Evaluation and Treatment: Rehabilitation  ONSET DATE: 07/30/23  Fall resulting in R proximal humerus fx.  05/20/24 surgery date  SUBJECTIVE:                                                                                                                                                                                       SUBJECTIVE STATEMENT: Pt. Fell on 2/17 while bring the trash bin in resulting in R proximal humerus fracture.  Pt. States the last x-ray revealed fracture is healed.  Pt. Reports 7/10 R shoulder pain at rest and using 2 Oxy 10 mg. Every 6 hours. Pt. Currently has no HEP and is just using it.  No sling use and pt. Planning to RTW on 06/16/24.  No recent falls and pt. Instructed to not pick up anything heavier than a cell phone.  MD f/u on 06/30/24.  Pt. 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   Hand dominance: Right   PERTINENT HISTORY: Pt. Well known to PT clinic.  See MD notes.    PAIN:  Are you having pain? Yes: NPRS scale: 7/10  Pain location: R proximal shoulder Pain description: aching/ sharp Aggravating factors: movement Relieving factors: meds/ rest   PRECAUTIONS:  Shoulder/ lifting   RED FLAGS: None      WEIGHT BEARING RESTRICTIONS: No   FALLS:  Has patient fallen in last 6 months? Yes. Number of falls 1 with injury (no falls in past 6 months.)   LIVING ENVIRONMENT: Lives with: lives alone Has following equipment at home: None   OCCUPATION: Editor, commissioning at [employer]   PLOF: Independent   PATIENT GOALS: Increase R shoulder ROM/ strength/ return to PLOF   NEXT MD VISIT: 06/30/24   OBJECTIVE:  Note: Objective measures were completed at Evaluation unless otherwise noted.   DIAGNOSTIC FINDINGS:  See imaging   PATIENT SURVEYS:  Quick Dash 86.4% significant limitations   COGNITION: Overall cognitive status: Within functional limits for tasks assessed                                  SENSATION: WFL   POSTURE: Forward head/ rounded shoulder posture   UPPER EXTREMITY ROM:    Active ROM Right A/PROM eval Left AROM eval  Shoulder flexion 87/96 deg. 150 deg.  Shoulder extension      Shoulder abduction 67/72 deg. 152 deg.  Shoulder adduction      Shoulder internal rotation 48/62 deg. 82 deg.  Shoulder external rotation -8/0 deg. 66 deg.  Elbow flexion [facility] WFL  Elbow extension      Wrist flexion      Wrist extension      Wrist ulnar deviation      Wrist radial deviation      Wrist pronation      Wrist supination limited    (Blank rows = not tested)  Good C-spine AROM, except L/R lateral flexion (limited 50%).     UPPER EXTREMITY MMT:   MMT Right eval Left eval  Shoulder flexion Unable to test R UE TBD   Shoulder extension      Shoulder abduction      Shoulder adduction      Shoulder internal rotation      Shoulder external rotation      Middle trapezius      Lower trapezius      Elbow flexion      Elbow extension      Wrist flexion      Wrist extension      Wrist ulnar deviation      Wrist radial deviation      Wrist pronation      Wrist supination      Grip strength (lbs) 14.1# 20.6%  (Blank  rows = not tested)   SHOULDER SPECIAL TESTS: NT   JOINT MOBILITY TESTING:  Guarded movement.   Good incision healing with 1 scab/ no steristrips/ hard scar.  Discussed scar massage/ use of lotion.     PALPATION:  Minimal tenderness over incision.  TREATMENT DATE: 06/26/2024   Subjective:  Pt reports doing too many activities with her shoulder yesterday resulting in increased pain. Has been active in work and home.  Pt is worried about increasing shoulder pain and has been instructed to continue HEP.  Pt reports having several other medical events which has slowed down healing time and caused increased pain during recovery.   UPPER EXTREMITY ROM:    Active ROM Right A/PROM 06/26/24 Left AROM eval  Shoulder flexion 110/130 deg. 150 deg.  Shoulder extension      Shoulder abduction 95/106 deg. 152 deg.  Shoulder adduction      Shoulder internal rotation 62/70 deg. 82 deg.  Shoulder external rotation 5/10 deg. 66 deg.  Elbow flexion [facility] WFL  Elbow extension      Wrist flexion      Wrist extension      Wrist ulnar deviation      Wrist radial deviation      Wrist pronation      Wrist supination limited    (Blank rows = not tested)  R shoulder: Seated Shoulder flexion A/PROM, 90 deg., 105 deg. Seated Shoulder Abduction A/PROM, 70 deg., 95 deg. Seated Shoulder ER A/PROM, 10 deg., 20 deg. Seated Shoulder IR A/PROM, WNL   There.ex.:  Seated shoulder pulleys in scaption/Abd/ flexion: 5 mins.  Warm-up/ reviewed technique for HEP.  Standing wall ladder: R shoulder flexion (marked with sticker) - 8x.   Supine wand ex.: chest press/ shoulder flexion: 10x2  Supine R serratus punches 10x2.  Limited scapular mobility.    Scapular circles/ Scaption x10 at mirror for visual feedback   Reviewed HEP  Manual tx.:    Supine R shoulder PROM (all  planes) -10 each.    Seated soft tissue massage to bilateral upper and middle traps, rhomboids, anterior deltoid (scar massage) and middle/posterior deltoid    PATIENT EDUCATION: Education details: See HEP Person educated: Patient Education method: Explanation, Demonstration, and Handouts Education comprehension: verbalized understanding and returned demonstration   HOME EXERCISE PROGRAM: Access Code: [email or web address] URL: [email or web address] Date: 06/11/2024 Prepared by: Ozell Sero  Exercises - Seated Shoulder Flexion AAROM with Pulley Behind  - 2-3 x daily - 7 x weekly - 1 sets - 20 reps - Seated Shoulder Abduction AAROM with Pulley Behind  - 2-3 x daily - 7 x weekly - 1 sets - 20 reps - Seated Scapular Retraction  - 2-3 x daily - 7 x weekly - 1 sets - 20 reps - Supine Shoulder Press with Dowel  - 2-3 x daily - 7 x weekly - 1 sets - 20 reps - Circular Shoulder Pendulum with Table Support  - 1 x daily - 7 x weekly - 1 sets - 10 reps   ASSESSMENT:   CLINICAL IMPRESSION: Pt. Presents with limited R shoulder A/PROM and moderate c/o pain.  Pt. Presents with good incision healing and instructed on scar massage and reviewed HEP.  Pt. Limited to 110 deg. R shoulder flexion due to stiffness/ pain.  Shoulder ROM has shown improvement in all ranges (active/passive) since last measurements were taken. Pt. Instructed to focus on HEP daily with focus on R shoulder ROM.  Pt. Will benefit from skilled PT services to increase R shoulder ROM/ strength to improve overhead reaching/ pain-free mobility.     OBJECTIVE IMPAIRMENTS: decreased activity tolerance, decreased mobility, decreased ROM, decreased strength, hypomobility, impaired flexibility, impaired UE functional use, improper body mechanics, postural dysfunction, and pain.    ACTIVITY LIMITATIONS: carrying, lifting, bathing,  toileting, dressing, self feeding, reach over head, and hygiene/grooming   PARTICIPATION LIMITATIONS:  cleaning, laundry, driving, shopping, community activity, and occupation   PERSONAL FACTORS: Fitness and Past/current experiences are also affecting patient's functional outcome.    REHAB POTENTIAL: Good   CLINICAL DECISION MAKING: Evolving/moderate complexity   EVALUATION COMPLEXITY: Moderate     GOALS: Goals reviewed with patient? Yes   SHORT TERM GOALS: Target date: 07/02/24   Pt. independent with HEP to increase R shoulder PROM to [facility] as compared to L shoulder to progress to AROM/ ADL.   Baseline: see above Goal status: INITIAL   LONG TERM GOALS: Target date: 08/06/24   Pt. Will decrease QuickDASH to <50% to improve UE functional mobility.   Baseline:  86.4% (significant limitations). Goal status: INITIAL   2.  Pt. Will increase R shoulder flexion AROM to >120 deg. To reach overhead/ manage hair.   Baseline:  limited R sh. AROM at time of evaluation Goal status: INITIAL   3.  Pt. Will present with 4/5 MMT in R UE to improve daily household/ work-related tasks.   Baseline:  No MMT at time of evaluation Goal status: INITIAL   4.  Pt. Will report no R shoulder pain/limitations with daily tasks to improve return to PLOF.   Baseline:  R shoulder pain/ limited.  Goal status: INITIAL     PLAN:   PT FREQUENCY: 2x/week   PT DURATION: 8 weeks   PLANNED INTERVENTIONS: 97110-Therapeutic exercises, 97530- Therapeutic activity, 97112- Neuromuscular re-education, 97535- Self Care, 02859- Manual therapy, G0283- Electrical stimulation (unattended), Patient/Family education, Joint mobilization, Cryotherapy, and Moist heat   PLAN FOR NEXT SESSION:  Begin implementing light isometrics next week following shoulder check-in with referring physician  Juliene Levine, SPT Ozell JAYSON Sero, PT, DPT # [phone] 06/26/2024, 8:36 AM  "

## 2024-06-29 NOTE — Discharge Summary (Signed)
 Physician Discharge Summary      Patient ID: Mariah Reeves MRN: [id number] DOB/AGE: Feb 25, 1966 59 y.o.  Admit date: 05/20/2024 Discharge date: 05/22/2024  Admission Diagnoses:  Principal Problem:   S/P reverse total shoulder arthroplasty, right Active Problems:   Arthritis of right shoulder   Discharge Diagnoses:  Same  Surgeries: Procedures: RIGHT REVERSE SHOULDER ARTHROPLASTY, BICEPS TENODESIS on 05/20/2024   Consultants:   Discharged Condition: Stable  Hospital Course: Mariah Reeves is an 59 y.o. 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  They were brought to the operating room on 05/20/2024 and underwent the above named procedures.  Pt awoke from anesthesia without complication and was transferred to the floor. On POD1, patient's pain was controlled while interscalene block was in effect but after this wore off, she had fairly pronounced postop pain.  This slightly improved by POD 2 to the point where she was comfortable for discharge.  No red flag signs or symptoms throughout her stay..  Pt will f/u with Dr. Addie in clinic in ~2 weeks.   Antibiotics given:  Anti-infectives (From admission, onward)    Start     Dose/Rate Route Frequency Ordered Stop   05/21/24 0600  ceFAZolin  (ANCEF ) IVPB 2g/100 mL premix        2 g 200 mL/hr over 30 Minutes Intravenous On call to O.R. 05/20/24 1248 05/20/24 1445   05/20/24 2200  ceFAZolin  (ANCEF ) IVPB 2g/100 mL premix        2 g 200 mL/hr over 30 Minutes Intravenous Every 8 hours 05/20/24 1934 05/21/24 1530   05/20/24 1458  vancomycin  (VANCOCIN ) powder  Status:  Discontinued          As needed 05/20/24 1458 05/20/24 1813     .  Recent vital signs:  Vitals:   05/21/24 2345 05/22/24 0320  BP: (!) 112/54 (!) 105/56  Pulse:  92  Resp: 18 18  Temp: 98 F (36.7 C) 98.2 F (36.8 C)  SpO2: 96% 100%    Recent laboratory studies:   Results for orders placed or performed during the hospital encounter of 05/20/24  CBC with Differential/Platelet   Collection Time: 05/20/24 10:00 PM  Result Value Ref Range   WBC 7.0 4.0 - 10.5 K/uL   RBC 2.85 (L) 3.87 - 5.11 MIL/uL   Hemoglobin 9.7 (L) 12.0 - 15.0 g/dL   HCT 71.0 (L) 63.9 - 53.9 %   MCV 101.4 (H) 80.0 - 100.0 fL   MCH 34.0 26.0 - 34.0 pg   MCHC 33.6 30.0 - 36.0 g/dL   RDW 85.5 88.4 - 84.4 %   Platelets 83 (L) 150 - 400 K/uL   nRBC 0.0 0.0 - 0.2 %   Neutrophils Relative % 92 %   Neutro Abs 6.4 1.7 - 7.7 K/uL   Lymphocytes Relative 6 %   Lymphs Abs 0.4 (L) 0.7 - 4.0 K/uL   Monocytes Relative 2 %   Monocytes Absolute 0.1 0.1 - 1.0 K/uL   Eosinophils Relative 0 %   Eosinophils Absolute 0.0 0.0 - 0.5 K/uL   Basophils Relative 0 %   Basophils Absolute 0.0 0.0 - 0.1 K/uL   WBC Morphology MORPHOLOGY UNREMARKABLE    Smear Review Normal platelet morphology    Immature Granulocytes 0 %   Abs Immature Granulocytes 0.03 0.00 - 0.07 K/uL   Ovalocytes PRESENT   CBC with Differential/Platelet   Collection Time:  05/21/24  7:19 AM  Result Value Ref Range   WBC 8.2 4.0 - 10.5 K/uL   RBC 2.57 (L) 3.87 - 5.11 MIL/uL   Hemoglobin 9.0 (L) 12.0 - 15.0 g/dL   HCT 73.3 (L) 63.9 - 53.9 %   MCV 103.5 (H) 80.0 - 100.0 fL   MCH 35.0 (H) 26.0 - 34.0 pg   MCHC 33.8 30.0 - 36.0 g/dL   RDW 85.3 88.4 - 84.4 %   Platelets 90 (L) 150 - 400 K/uL   nRBC 0.0 0.0 - 0.2 %   Neutrophils Relative % 83 %   Neutro Abs 6.8 1.7 - 7.7 K/uL   Lymphocytes Relative 9 %   Lymphs Abs 0.8 0.7 - 4.0 K/uL   Monocytes Relative 8 %   Monocytes Absolute 0.6 0.1 - 1.0 K/uL   Eosinophils Relative 0 %   Eosinophils Absolute 0.0 0.0 - 0.5 K/uL   Basophils Relative 0 %   Basophils Absolute 0.0 0.0 - 0.1 K/uL   Immature Granulocytes 0 %   Abs Immature Granulocytes 0.03 0.00 - 0.07 K/uL    Discharge Medications:   Allergies as of 05/22/2024       Reactions   Contrast Media [iodinated Contrast Media]  Hives   Silicone Hives   silicones   Carbetapentane Hives   Tape Hives   Adhesive Tape   Trazodone  And Nefazodone    Sleep walking        Medication List     STOP taking these medications    oxyCODONE -acetaminophen  10-325 MG tablet Commonly known as: PERCOCET       TAKE these medications    acetaminophen  325 MG tablet Commonly known as: TYLENOL  Take 2 tablets (650 mg total) by mouth every 6 (six) hours as needed for mild pain (pain score 1-3) (or temp > 100.5).   dasatinib  80 MG tablet Commonly known as: SPRYCEL  Take 80 mg by mouth in the morning.   docusate sodium  100 MG capsule Commonly known as: COLACE Take 1 capsule (100 mg total) by mouth 2 (two) times daily.   estradiol  0.1 MG/GM vaginal cream Commonly known as: ESTRACE  Insert 1 g vaginally weekly as maintenance What changed:  how much to take how to take this when to take this reasons to take this additional instructions   lamoTRIgine  200 MG tablet Commonly known as: LAMICTAL  Take 400 mg by mouth at bedtime.   methocarbamol  500 MG tablet Commonly known as: ROBAXIN  Take 1 tablet (500 mg total) by mouth every 8 (eight) hours as needed for muscle spasms.   ondansetron  8 MG disintegrating tablet Commonly known as: ZOFRAN -ODT Take 8 mg by mouth 2 (two) times daily as needed for vomiting or nausea.        Diagnostic Studies: No results found.  Disposition: Discharge disposition: 01-Home or Self Care       Discharge Instructions     Call MD / Call 911   Complete by: As directed    If you experience chest pain or shortness of breath, CALL 911 and be transported to the hospital emergency room.  If you develope a fever above 101 F, pus (white drainage) or increased drainage or redness at the wound, or calf pain, call your surgeon's office.   Constipation Prevention   Complete by: As directed    Drink plenty of fluids.  Prune juice may be helpful.  You may use a stool softener, such as Colace  (over the counter) 100 mg twice a day.  Use MiraLax (over the counter) for constipation as needed.   Discharge instructions   Complete by: As directed    You may shower, dressing is waterproof.  Do not bathe or soak the operative shoulder in a tub, pool.  Use the CPM machine 3 times a day for one hour each time.  No lifting with the operative shoulder. Continue use of the sling.  Follow-up with Dr. Addie in ~2 weeks on your given appointment date.  We will remove your adhesive bandage at that time.    Dental Antibiotics:  In most cases prophylactic antibiotics for Dental procdeures after total joint surgery are not necessary.  Exceptions are as follows:  1. History of prior total joint infection  2. Severely immunocompromised (Organ Transplant, cancer chemotherapy, Rheumatoid biologic meds such as Humera)  3. Poorly controlled diabetes (A1C &gt; 8.0, blood glucose over 200)  If you have one of these conditions, contact your surgeon for an antibiotic prescription, prior to your dental procedure.   Increase activity slowly as tolerated   Complete by: As directed    Post-operative opioid taper instructions:   Complete by: As directed    POST-OPERATIVE OPIOID TAPER INSTRUCTIONS: It is important to wean off of your opioid medication as soon as possible. If you do not need pain medication after your surgery it is ok to stop day one. Opioids include: Codeine, Hydrocodone (Norco, Vicodin), Oxycodone (Percocet, oxycontin ) and hydromorphone  amongst others.  Long term and even short term use of opiods can cause: Increased pain response Dependence Constipation Depression Respiratory depression And more.  Withdrawal symptoms can include Flu like symptoms Nausea, vomiting And more Techniques to manage these symptoms Hydrate well Eat regular healthy meals Stay active Use relaxation techniques(deep breathing, meditating, yoga) Do Not substitute Alcohol to help with tapering If you have been  on opioids for less than two weeks and do not have pain than it is ok to stop all together.  Plan to wean off of opioids This plan should start within one week post op of your joint replacement. Maintain the same interval or time between taking each dose and first decrease the dose.  Cut the total daily intake of opioids by one tablet each day Next start to increase the time between doses. The last dose that should be eliminated is the evening dose.           Contact information for follow-up providers     Derick Leita POUR, MD Follow up.   Specialty: Family Medicine Contact information: [address] [place] [address] [phone]              Contact information for after-discharge care     Home Medical Care     [facility]) .   Service: Home Health Services Contact information: [address] [place]  [address] [phone]                      Signed: Herlene Calix 06/29/2024, 6:02 PM

## 2024-07-01 ENCOUNTER — Ambulatory Visit: Admitting: Physical Therapy

## 2024-07-02 ENCOUNTER — Ambulatory Visit: Admitting: Orthopedic Surgery

## 2024-07-02 ENCOUNTER — Other Ambulatory Visit: Payer: Self-pay

## 2024-07-02 DIAGNOSIS — Z96611 Presence of right artificial shoulder joint: Secondary | ICD-10-CM

## 2024-07-03 ENCOUNTER — Encounter: Payer: Self-pay | Admitting: Orthopedic Surgery

## 2024-07-03 ENCOUNTER — Encounter: Payer: Self-pay | Admitting: Physical Therapy

## 2024-07-03 ENCOUNTER — Ambulatory Visit: Admitting: Physical Therapy

## 2024-07-03 DIAGNOSIS — M6281 Muscle weakness (generalized): Secondary | ICD-10-CM

## 2024-07-03 DIAGNOSIS — M25611 Stiffness of right shoulder, not elsewhere classified: Secondary | ICD-10-CM

## 2024-07-03 DIAGNOSIS — Z96611 Presence of right artificial shoulder joint: Secondary | ICD-10-CM

## 2024-07-03 DIAGNOSIS — G8929 Other chronic pain: Secondary | ICD-10-CM

## 2024-07-03 NOTE — Therapy (Signed)
 " OUTPATIENT PHYSICAL THERAPY SHOULDER TREATMENT  Patient Name: Mariah Reeves MRN: [id number] DOB:Nov 03, 1965, 59 y.o., female Today's Date: 07/03/2024  END OF SESSION:  PT End of Session - 07/03/24 0739     Visit Number 4    Number of Visits 16    Date for Recertification  08/06/24    PT Start Time 0739    PT Stop Time 0812    PT Time Calculation (min) 33 min          Past Medical History:  Diagnosis Date   Allergy    no current problems as of 05/16/24   Anemia 10/2023   hx iron infusion   Anomaly, uterus    tumor of uterus   Anxiety    Arthritis    Bilateral occipital neuralgia    Bipolar 1 disorder (HCC)    Chronic back pain    Chronic myeloid leukemia (CML), BCR/ABL-positive (HCC) 08/06/2023   tx with Sprycel  at [facility]   Complication of anesthesia    hard time waking up after gastric bypass.Pt stated that she has a small mouth and has had several surgeries since gastric bypass with no problems.   DDD (degenerative disc disease), lumbar 12/24/2014   Depression    Facet syndrome, lumbar    Family history of breast cancer    2/21 cancer genetic testing letter sent   GERD (gastroesophageal reflux disease)    before gastric bypass   Headache    Migraines   Hearing loss    left ear - has hearing aid   History of kidney stones    Left   Hx of vertigo    Hypertension    Patella fracture    Left   PTSD (post-traumatic stress disorder)    Sleep apnea 08/08/2021   BiPAP - uses nightly   Stroke (HCC) 07/06/2022   TIA on 08/30/22   Wears glasses    Past Surgical History:  Procedure Laterality Date   ABDOMINOPLASTY  2006   CARPAL TUNNEL RELEASE Left    CHOLECYSTECTOMY  2008   COLONOSCOPY W/ POLYPECTOMY     CYSTOSCOPY W/ URETERAL STENT PLACEMENT  01/03/2017   Procedure: CYSTOSCOPY WITH RETROGRADE PYELOGRAM/URETERAL STENT PLACEMENT;  Surgeon: Chauncey Redell Agent, MD;  Location: [facility];  Service: Urology;;   CYSTOSCOPY W/ URETERAL STENT PLACEMENT Left  01/17/2017   Procedure: CYSTOSCOPY WITH STENT REPLACEMENT;  Surgeon: Chauncey Redell Agent, MD;  Location: [facility];  Service: Urology;  Laterality: Left;   DILATATION & CURETTAGE/HYSTEROSCOPY WITH MYOSURE N/A 07/08/2015   Procedure: DILATATION & CURETTAGE/HYSTEROSCOPY WITH MYOSURE;  Surgeon: Lamar SHAUNNA Lesches, MD;  Location: [facility];  Service: Gynecology;  Laterality: N/A;   DILATION AND CURETTAGE OF UTERUS     1/17   EXTRACORPOREAL SHOCK WAVE LITHOTRIPSY Left 12/07/2016   Procedure: EXTRACORPOREAL SHOCK WAVE LITHOTRIPSY (ESWL);  Surgeon: Chauncey Redell Agent, MD;  Location: [facility];  Service: Urology;  Laterality: Left;   GASTRIC BYPASS  2005   KNEE SURGERY Right    ACL- repair   LITHOTRIPSY     MIDDLE EAR SURGERY     NASAL SEPTUM SURGERY     OVARY SURGERY Right    REVERSE SHOULDER ARTHROPLASTY Right 05/20/2024   Procedure: RIGHT REVERSE SHOULDER ARTHROPLASTY, BICEPS TENODESIS;  Surgeon: Addie Cordella Hamilton, MD;  Location: [facility];  Service: Orthopedics;  Laterality: Right;   ROTATOR CUFF REPAIR Right 2023   [facility]   UPPER GI ENDOSCOPY     URETEROSCOPY WITH HOLMIUM LASER  LITHOTRIPSY     perforated ureter. 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/06/2023   Essential hypertension 04/04/2023   TIA (transient ischemic attack) 08/30/2022   Stroke (HCC) 07/06/2022   Complex sleep apnea syndrome 08/08/2021   Encounter for BiPAP use counseling 08/08/2021   Mass of upper inner quadrant of left breast  07/19/2021   Vaginal atrophy 07/19/2021   Family history of breast cancer 07/19/2021   OSA on CPAP 04/19/2020   CPAP use counseling 04/19/2020   Nephrolithiasis 01/26/2017   Left ureteral stone 01/03/2017   Endometrial disorder 07/08/2015   Bilateral occipital neuralgia 03/02/2015   DDD (degenerative disc disease), lumbar 12/24/2014   Facet syndrome, lumbar 12/24/2014   Sacroiliac joint dysfunction 12/24/2014   PCP: Derick Leita POUR, MD  REFERRING PROVIDER: Shirly Carlin CROME, PA-C  REFERRING DIAG:  Diagnosis  [phone] (ICD-10-CM) - S/P reverse total shoulder arthroplasty, right    THERAPY DIAG:  S/P reverse total shoulder arthroplasty, right  Chronic right shoulder pain  Muscle weakness (generalized)  Shoulder joint stiffness, right  Rationale for Evaluation and Treatment: Rehabilitation  ONSET DATE: 07/30/23  Fall resulting in R proximal humerus fx.  05/20/24 surgery date  SUBJECTIVE:                                                                                                                                                                                       SUBJECTIVE STATEMENT: Pt. Fell on 2/17 while bring the trash bin in resulting in R proximal humerus fracture.  Pt. States the last x-ray revealed fracture is healed.  Pt. Reports 7/10 R shoulder pain at rest and using 2 Oxy 10 mg. Every 6 hours. Pt. Currently has no HEP and is just using it.  No sling use and pt. Planning to RTW on 06/16/24.  No recent falls and pt. Instructed to not pick up anything heavier than a cell phone.  MD f/u on 06/30/24.  Pt. 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   Hand dominance: Right   PERTINENT HISTORY: Pt. Well known to PT clinic.  See MD notes.    PAIN:  Are you having pain? Yes: NPRS scale: 7/10 Pain location: R  proximal shoulder Pain description: aching/ sharp Aggravating factors: movement Relieving factors: meds/ rest   PRECAUTIONS: Shoulder/ lifting    RED FLAGS: None      WEIGHT BEARING RESTRICTIONS: No   FALLS:  Has patient fallen in last 6 months? Yes. Number of falls 1 with injury (no falls in past 6 months.)   LIVING ENVIRONMENT: Lives with: lives alone Has following equipment at home: None   OCCUPATION: Editor, commissioning at [employer]   PLOF: Independent   PATIENT GOALS: Increase R shoulder ROM/ strength/ return to PLOF   NEXT MD VISIT: 06/30/24   OBJECTIVE:  Note: Objective measures were completed at Evaluation unless otherwise noted.   DIAGNOSTIC FINDINGS:  See imaging   PATIENT SURVEYS:  Quick Dash 86.4% significant limitations   COGNITION: Overall cognitive status: Within functional limits for tasks assessed                                  SENSATION: WFL   POSTURE: Forward head/ rounded shoulder posture   UPPER EXTREMITY ROM:    Active ROM Right A/PROM eval Left AROM eval  Shoulder flexion 87/96 deg. 150 deg.  Shoulder extension      Shoulder abduction 67/72 deg. 152 deg.  Shoulder adduction      Shoulder internal rotation 48/62 deg. 82 deg.  Shoulder external rotation -8/0 deg. 66 deg.  Elbow flexion [facility] WFL  Elbow extension      Wrist flexion      Wrist extension      Wrist ulnar deviation      Wrist radial deviation      Wrist pronation      Wrist supination limited    (Blank rows = not tested)  Good C-spine AROM, except L/R lateral flexion (limited 50%).     UPPER EXTREMITY MMT:   MMT Right eval Left eval  Shoulder flexion Unable to test R UE TBD   Shoulder extension      Shoulder abduction      Shoulder adduction      Shoulder internal rotation      Shoulder external rotation      Middle trapezius      Lower trapezius      Elbow flexion      Elbow extension      Wrist flexion      Wrist extension      Wrist ulnar deviation      Wrist radial deviation      Wrist pronation      Wrist supination      Grip strength (lbs) 14.1# 20.6%  (Blank rows = not tested)    SHOULDER SPECIAL TESTS: NT   JOINT MOBILITY TESTING:  Guarded movement.   Good incision healing with 1 scab/ no steristrips/ hard scar.  Discussed scar massage/ use of lotion.     PALPATION:  Minimal tenderness over incision.    UPPER EXTREMITY ROM:    Active ROM Right A/PROM 06/26/24 Left AROM eval  Shoulder flexion 110/130 deg. 150 deg.  Shoulder extension      Shoulder abduction 95/106 deg. 152 deg.  Shoulder adduction      Shoulder internal rotation 62/70 deg. 82 deg.  Shoulder external rotation 5/10 deg. 66 deg.  Elbow flexion [facility]  Elbow extension      Wrist flexion      Wrist extension      Wrist ulnar deviation  Wrist radial deviation      Wrist pronation      Wrist supination limited    (Blank rows = not tested)  R shoulder: Seated Shoulder flexion A/PROM, 90 deg., 105 deg. Seated Shoulder Abduction A/PROM, 70 deg., 95 deg. Seated Shoulder ER A/PROM, 10 deg., 20 deg. Seated Shoulder IR A/PROM, WNL                                                                                                                                        TREATMENT DATE: 07/03/2024   Subjective:  Pt reports going to MD for f/u yesterday and is to continue PT with a follow-up in 6 weeks. She is now able to use hairspray to fix her hair in the morning. 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  There.ex.:  Seated shoulder pulleys in scaption/Abd/ flexion: 5 mins.  Warm-up/ reviewed technique for HEP.  Standing wall ladder: R shoulder flexion (marked with sticker) - 8x.   Supine wand ex.: chest press/ shoulder flexion: 10x2 Added 1lb weight   Supine R serratus punches 10x2.  Limited scapular mobility.  Added 1lb weight   Scapular circles/ Scaption x10 at mirror for visual feedback   Reviewed HEP  Manual tx.:    Supine R shoulder PROM (all planes) -10 each.   Seated soft tissue massage to bilateral upper and middle  traps, rhomboids, anterior deltoid (scar massage) and middle/posterior deltoid    PATIENT EDUCATION: Education details: See HEP Person educated: Patient Education method: Explanation, Demonstration, and Handouts Education comprehension: verbalized understanding and returned demonstration   HOME EXERCISE PROGRAM: Access Code: [email or web address] URL: [email or web address] Date: 06/11/2024 Prepared by: Ozell Sero  Exercises - Seated Shoulder Flexion AAROM with Pulley Behind  - 2-3 x daily - 7 x weekly - 1 sets - 20 reps - Seated Shoulder Abduction AAROM with Pulley Behind  - 2-3 x daily - 7 x weekly - 1 sets - 20 reps - Seated Scapular Retraction  - 2-3 x daily - 7 x weekly - 1 sets - 20 reps - Supine Shoulder Press with Dowel  - 2-3 x daily - 7 x weekly - 1 sets - 20 reps - Circular Shoulder Pendulum with Table Support  - 1 x daily - 7 x weekly - 1 sets - 10 reps   ASSESSMENT:   CLINICAL IMPRESSION: Pt. Presents with limited R shoulder A/PROM and moderate c/o pain. Pt. Limited to 110 deg. R shoulder flexion AROM in seated due to stiffness/ pain and 130 deg. Flexion PROM (pain limited).  Shoulder ROM has shown improvement in all ranges (active/passive) since last measurements were taken. Pt. Instructed to focus on HEP daily with focus on R shoulder ROM.  Pt. Will benefit from skilled PT services to increase R shoulder ROM/ strength to improve overhead reaching/ pain-free mobility.  OBJECTIVE IMPAIRMENTS: decreased activity tolerance, decreased mobility, decreased ROM, decreased strength, hypomobility, impaired flexibility, impaired UE functional use, improper body mechanics, postural dysfunction, and pain.    ACTIVITY LIMITATIONS: carrying, lifting, bathing, toileting, dressing, self feeding, reach over head, and hygiene/grooming   PARTICIPATION LIMITATIONS: cleaning, laundry, driving, shopping, community activity, and occupation   PERSONAL FACTORS: Fitness and Past/current  experiences are also affecting patient's functional outcome.    REHAB POTENTIAL: Good   CLINICAL DECISION MAKING: Evolving/moderate complexity   EVALUATION COMPLEXITY: Moderate     GOALS: Goals reviewed with patient? Yes   SHORT TERM GOALS: Target date: 07/02/24   Pt. independent with HEP to increase R shoulder PROM to [facility] as compared to L shoulder to progress to AROM/ ADL.   Baseline: see above Goal status: On-going   LONG TERM GOALS: Target date: 08/06/24   Pt. Will decrease QuickDASH to <50% to improve UE functional mobility.   Baseline:  86.4% (significant limitations). Goal status: INITIAL   2.  Pt. Will increase R shoulder flexion AROM to >120 deg. To reach overhead/ manage hair.   Baseline:  limited R sh. AROM at time of evaluation Goal status: INITIAL   3.  Pt. Will present with 4/5 MMT in R UE to improve daily household/ work-related tasks.   Baseline:  No MMT at time of evaluation Goal status: INITIAL   4.  Pt. Will report no R shoulder pain/limitations with daily tasks to improve return to PLOF.   Baseline:  R shoulder pain/ limited.  Goal status: INITIAL     PLAN:   PT FREQUENCY: 2x/week   PT DURATION: 8 weeks   PLANNED INTERVENTIONS: 97110-Therapeutic exercises, 97530- Therapeutic activity, 97112- Neuromuscular re-education, 97535- Self Care, 02859- Manual therapy, G0283- Electrical stimulation (unattended), Patient/Family education, Joint mobilization, Cryotherapy, and Moist heat   PLAN FOR NEXT SESSION:  Begin implementing light isometrics next week following shoulder check-in with referring physician  Juliene Levine, SPT Ozell JAYSON Sero, PT, DPT # [phone] 07/03/2024, 12:42 PM  "

## 2024-07-03 NOTE — Progress Notes (Signed)
 "  Post-Op Visit Note   Patient: Mariah Reeves           Date of Birth: 09-23-1965           MRN: [id number] Visit Date: 07/02/2024 PCP: Derick Leita POUR, MD   Assessment & Plan:  Chief Complaint:  Chief Complaint  Patient presents with   Right Shoulder - Routine Post Op     05/20/2024 Right RSA     Visit Diagnoses:  1. S/P reverse total shoulder arthroplasty, right     Plan: [name] is a patient is 6 weeks out from right reverse shoulder replacement.  Making slow progress.  In physical therapy 2 times a week.  Has a pulley machine at home.  She is going back to her pain doctor now for pain medication.  On exam she has improving shoulder range of motion.  A little bit of compression sounds from the implant but good subscap strength and external rotation strength.  No mechanical subluxations.  Radiographs of the shoulder look good.  Plan at this time is 6-week return.  Continue therapy for strengthening and range of motion exercises.  She may need half days off every 2 weeks for about the next 4 months due to her shoulder symptoms following surgery.  Follow-Up Instructions: No follow-ups on file.   Orders:  Orders Placed This Encounter  Procedures   XR Shoulder Right   No orders of the defined types were placed in this encounter.   Imaging: No results found.  PMFS History: Patient Active Problem List   Diagnosis Date Noted   Arthritis of right shoulder 06/01/2024   S/P reverse total shoulder arthroplasty, right 05/20/2024   Closed fracture of proximal end of right humerus 08/11/2023   Chronic myeloid leukemia (CML), BCR/ABL-positive (HCC) 08/06/2023   Essential hypertension 04/04/2023   TIA (transient ischemic attack) 08/30/2022   Stroke (HCC) 07/06/2022   Complex sleep apnea syndrome 08/08/2021   Encounter for BiPAP use counseling 08/08/2021   Mass of upper inner quadrant of left breast 07/19/2021   Vaginal atrophy 07/19/2021   Family history of breast cancer 07/19/2021    OSA on CPAP 04/19/2020   CPAP use counseling 04/19/2020   Nephrolithiasis 01/26/2017   Left ureteral stone 01/03/2017   Endometrial disorder 07/08/2015   Bilateral occipital neuralgia 03/02/2015   DDD (degenerative disc disease), lumbar 12/24/2014   Facet syndrome, lumbar 12/24/2014   Sacroiliac joint dysfunction 12/24/2014   Past Medical History:  Diagnosis Date   Allergy    no current problems as of 05/16/24   Anemia 10/2023   hx iron infusion   Anomaly, uterus    tumor of uterus   Anxiety    Arthritis    Bilateral occipital neuralgia    Bipolar 1 disorder (HCC)    Chronic back pain    Chronic myeloid leukemia (CML), BCR/ABL-positive (HCC) 08/06/2023   tx with Sprycel  at [facility]   Complication of anesthesia    hard time waking up after gastric bypass.Pt stated that she has a small mouth and has had several surgeries since gastric bypass with no problems.   DDD (degenerative disc disease), lumbar 12/24/2014   Depression    Facet syndrome, lumbar    Family history of breast cancer    2/21 cancer genetic testing letter sent   GERD (gastroesophageal reflux disease)    before gastric bypass   Headache    Migraines   Hearing loss    left ear - has hearing  aid   History of kidney stones    Left   Hx of vertigo    Hypertension    Patella fracture    Left   PTSD (post-traumatic stress disorder)    Sleep apnea 08/08/2021   BiPAP - uses nightly   Stroke (HCC) 07/06/2022   TIA on 08/30/22   Wears glasses     Family History  Problem Relation Age of Onset   Hypertension Mother    Diabetes Mother    Heart disease Father    Alcohol abuse Father    Breast cancer Maternal Grandmother        not sure of age   Breast cancer Other    Breast cancer Other    Kidney cancer Neg Hx    Bladder Cancer Neg Hx     Past Surgical History:  Procedure Laterality Date   ABDOMINOPLASTY  2006   CARPAL TUNNEL RELEASE Left    CHOLECYSTECTOMY  2008   COLONOSCOPY W/ POLYPECTOMY      CYSTOSCOPY W/ URETERAL STENT PLACEMENT  01/03/2017   Procedure: CYSTOSCOPY WITH RETROGRADE PYELOGRAM/URETERAL STENT PLACEMENT;  Surgeon: Chauncey Redell Agent, MD;  Location: [facility];  Service: Urology;;   CYSTOSCOPY W/ URETERAL STENT PLACEMENT Left 01/17/2017   Procedure: CYSTOSCOPY WITH STENT REPLACEMENT;  Surgeon: Chauncey Redell Agent, MD;  Location: [facility];  Service: Urology;  Laterality: Left;   DILATATION & CURETTAGE/HYSTEROSCOPY WITH MYOSURE N/A 07/08/2015   Procedure: DILATATION & CURETTAGE/HYSTEROSCOPY WITH MYOSURE;  Surgeon: Lamar SHAUNNA Lesches, MD;  Location: [facility];  Service: Gynecology;  Laterality: N/A;   DILATION AND CURETTAGE OF UTERUS     1/17   EXTRACORPOREAL SHOCK WAVE LITHOTRIPSY Left 12/07/2016   Procedure: EXTRACORPOREAL SHOCK WAVE LITHOTRIPSY (ESWL);  Surgeon: Chauncey Redell Agent, MD;  Location: [facility];  Service: Urology;  Laterality: Left;   GASTRIC BYPASS  2005   KNEE SURGERY Right    ACL- repair   LITHOTRIPSY     MIDDLE EAR SURGERY     NASAL SEPTUM SURGERY     OVARY SURGERY Right    REVERSE SHOULDER ARTHROPLASTY Right 05/20/2024   Procedure: RIGHT REVERSE SHOULDER ARTHROPLASTY, BICEPS TENODESIS;  Surgeon: Addie Cordella Hamilton, MD;  Location: [facility];  Service: Orthopedics;  Laterality: Right;   ROTATOR CUFF REPAIR Right 2023   [facility]   UPPER GI ENDOSCOPY     URETEROSCOPY WITH HOLMIUM LASER LITHOTRIPSY     perforated ureter. had nephrostomy tube for brief time   URETEROSCOPY WITH HOLMIUM LASER LITHOTRIPSY Left 01/17/2017   Procedure: URETEROSCOPY WITH HOLMIUM LASER LITHOTRIPSY;  Surgeon: Chauncey Redell Agent, MD;  Location: [facility];  Service: Urology;  Laterality: Left;   Social History   Occupational History   Not on file  Tobacco Use   Smoking status: Never   Smokeless tobacco: Never  Vaping Use   Vaping status: Never Used  Substance and Sexual Activity   Alcohol use: No   Drug use: No   Sexual activity: Not Currently    Birth  control/protection: None, Post-menopausal     "

## 2024-07-08 ENCOUNTER — Ambulatory Visit: Admitting: Physical Therapy

## 2024-07-10 ENCOUNTER — Ambulatory Visit: Admitting: Physical Therapy

## 2024-07-15 ENCOUNTER — Ambulatory Visit: Admitting: Physical Therapy

## 2024-07-15 ENCOUNTER — Encounter: Payer: Self-pay | Admitting: Physical Therapy

## 2024-07-15 DIAGNOSIS — M6281 Muscle weakness (generalized): Secondary | ICD-10-CM

## 2024-07-15 DIAGNOSIS — G8929 Other chronic pain: Secondary | ICD-10-CM

## 2024-07-15 DIAGNOSIS — Z96611 Presence of right artificial shoulder joint: Secondary | ICD-10-CM

## 2024-07-15 DIAGNOSIS — M25611 Stiffness of right shoulder, not elsewhere classified: Secondary | ICD-10-CM

## 2024-07-17 ENCOUNTER — Ambulatory Visit: Admitting: Physical Therapy

## 2024-07-18 ENCOUNTER — Ambulatory Visit: Admitting: Physical Therapy

## 2024-07-22 ENCOUNTER — Ambulatory Visit: Admitting: Physical Therapy

## 2024-07-24 ENCOUNTER — Ambulatory Visit: Admitting: Physical Therapy

## 2024-07-29 ENCOUNTER — Ambulatory Visit: Admitting: Physical Therapy

## 2024-07-31 ENCOUNTER — Ambulatory Visit: Admitting: Physical Therapy

## 2024-08-05 ENCOUNTER — Ambulatory Visit: Admitting: Physical Therapy

## 2024-08-07 ENCOUNTER — Ambulatory Visit: Admitting: Physical Therapy

## 2024-08-13 ENCOUNTER — Encounter: Admitting: Orthopedic Surgery

## 2024-08-14 ENCOUNTER — Encounter: Admitting: Surgical
